# Patient Record
Sex: Female | Born: 1955
Health system: Southern US, Community
[De-identification: ages and names within clinical notes are randomized; demographics above are authoritative.]

## PROBLEM LIST (undated history)

## (undated) DIAGNOSIS — F419 Anxiety disorder, unspecified: Secondary | ICD-10-CM

## (undated) DIAGNOSIS — I219 Acute myocardial infarction, unspecified: Secondary | ICD-10-CM

## (undated) DIAGNOSIS — I2510 Atherosclerotic heart disease of native coronary artery without angina pectoris: Secondary | ICD-10-CM

## (undated) DIAGNOSIS — K219 Gastro-esophageal reflux disease without esophagitis: Secondary | ICD-10-CM

## (undated) DIAGNOSIS — Z955 Presence of coronary angioplasty implant and graft: Secondary | ICD-10-CM

## (undated) DIAGNOSIS — F32A Depression, unspecified: Secondary | ICD-10-CM

## (undated) DIAGNOSIS — E063 Autoimmune thyroiditis: Secondary | ICD-10-CM

## (undated) DIAGNOSIS — I10 Essential (primary) hypertension: Secondary | ICD-10-CM

## (undated) DIAGNOSIS — M1710 Unilateral primary osteoarthritis, unspecified knee: Secondary | ICD-10-CM

## (undated) DIAGNOSIS — G4734 Idiopathic sleep related nonobstructive alveolar hypoventilation: Secondary | ICD-10-CM

## (undated) DIAGNOSIS — J449 Chronic obstructive pulmonary disease, unspecified: Secondary | ICD-10-CM

## (undated) DIAGNOSIS — F329 Major depressive disorder, single episode, unspecified: Secondary | ICD-10-CM

## (undated) DIAGNOSIS — M179 Osteoarthritis of knee, unspecified: Secondary | ICD-10-CM

## (undated) DIAGNOSIS — Z9981 Dependence on supplemental oxygen: Secondary | ICD-10-CM

## (undated) DIAGNOSIS — T7840XA Allergy, unspecified, initial encounter: Secondary | ICD-10-CM

## (undated) DIAGNOSIS — R51 Headache: Secondary | ICD-10-CM

## (undated) DIAGNOSIS — E039 Hypothyroidism, unspecified: Secondary | ICD-10-CM

## (undated) DIAGNOSIS — M5030 Other cervical disc degeneration, unspecified cervical region: Secondary | ICD-10-CM

## (undated) DIAGNOSIS — E785 Hyperlipidemia, unspecified: Secondary | ICD-10-CM

## (undated) HISTORY — DX: Gastro-esophageal reflux disease without esophagitis: K21.9

## (undated) HISTORY — DX: Chronic obstructive pulmonary disease, unspecified: J44.9

## (undated) HISTORY — DX: Headache: R51

## (undated) HISTORY — DX: Autoimmune thyroiditis: E06.3

## (undated) HISTORY — DX: Acute myocardial infarction, unspecified: I21.9

## (undated) HISTORY — DX: Allergy, unspecified, initial encounter: T78.40XA

## (undated) HISTORY — PX: BUNIONECTOMY: SHX129

## (undated) HISTORY — DX: Anxiety disorder, unspecified: F41.9

## (undated) HISTORY — DX: Essential (primary) hypertension: I10

## (undated) HISTORY — DX: Idiopathic sleep related nonobstructive alveolar hypoventilation: G47.34

## (undated) HISTORY — DX: Other cervical disc degeneration, unspecified cervical region: M50.30

## (undated) HISTORY — DX: Hyperlipidemia, unspecified: E78.5

## (undated) HISTORY — PX: SHOULDER SURGERY: SHX246

## (undated) HISTORY — DX: Presence of coronary angioplasty implant and graft: Z95.5

## (undated) HISTORY — DX: Hypothyroidism, unspecified: E03.9

## (undated) HISTORY — DX: Depression, unspecified: F32.A

## (undated) HISTORY — PX: ABDOMINAL HYSTERECTOMY: SHX81

## (undated) HISTORY — PX: OOPHORECTOMY: SHX86

## (undated) HISTORY — DX: Major depressive disorder, single episode, unspecified: F32.9

## (undated) HISTORY — DX: Unilateral primary osteoarthritis, unspecified knee: M17.10

## (undated) HISTORY — DX: Osteoarthritis of knee, unspecified: M17.9

## (undated) HISTORY — PX: CERVICAL FUSION: SHX112

## (undated) HISTORY — PX: HAND SURGERY: SHX662

---
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
## 1982-03-07 HISTORY — PX: TUBAL LIGATION: SHX77

## 1994-03-07 HISTORY — PX: THYROIDECTOMY: SHX17

## 1999-02-16 ENCOUNTER — Ambulatory Visit: Admission: RE | Admit: 1999-02-16 | Discharge: 1999-02-16 | Payer: Self-pay | Admitting: Endocrinology

## 2000-03-31 ENCOUNTER — Encounter: Admission: RE | Admit: 2000-03-31 | Discharge: 2000-03-31 | Payer: Self-pay | Admitting: Internal Medicine

## 2000-03-31 ENCOUNTER — Encounter: Payer: Self-pay | Admitting: Internal Medicine

## 2000-11-09 ENCOUNTER — Encounter: Payer: Self-pay | Admitting: Internal Medicine

## 2000-11-09 ENCOUNTER — Encounter: Admission: RE | Admit: 2000-11-09 | Discharge: 2000-11-09 | Payer: Self-pay | Admitting: Internal Medicine

## 2001-05-07 ENCOUNTER — Other Ambulatory Visit: Admission: RE | Admit: 2001-05-07 | Discharge: 2001-05-07 | Payer: Self-pay | Admitting: Obstetrics and Gynecology

## 2001-05-11 ENCOUNTER — Encounter: Payer: Self-pay | Admitting: Obstetrics and Gynecology

## 2001-05-11 ENCOUNTER — Ambulatory Visit (HOSPITAL_COMMUNITY): Admission: RE | Admit: 2001-05-11 | Discharge: 2001-05-11 | Payer: Self-pay | Admitting: Obstetrics and Gynecology

## 2002-05-06 ENCOUNTER — Encounter: Payer: Self-pay | Admitting: Internal Medicine

## 2002-05-06 LAB — CONVERTED CEMR LAB

## 2003-10-13 ENCOUNTER — Other Ambulatory Visit: Admission: RE | Admit: 2003-10-13 | Discharge: 2003-10-13 | Payer: Self-pay | Admitting: Obstetrics and Gynecology

## 2004-01-12 ENCOUNTER — Ambulatory Visit: Payer: Self-pay | Admitting: Internal Medicine

## 2004-04-22 ENCOUNTER — Ambulatory Visit: Payer: Self-pay | Admitting: Internal Medicine

## 2004-12-27 ENCOUNTER — Ambulatory Visit: Payer: Self-pay | Admitting: Internal Medicine

## 2005-01-03 ENCOUNTER — Ambulatory Visit: Payer: Self-pay | Admitting: Internal Medicine

## 2005-01-11 ENCOUNTER — Ambulatory Visit: Payer: Self-pay | Admitting: Internal Medicine

## 2005-01-24 ENCOUNTER — Encounter: Payer: Self-pay | Admitting: Internal Medicine

## 2005-03-02 ENCOUNTER — Other Ambulatory Visit: Admission: RE | Admit: 2005-03-02 | Discharge: 2005-03-02 | Payer: Self-pay | Admitting: Obstetrics and Gynecology

## 2005-04-27 ENCOUNTER — Ambulatory Visit: Payer: Self-pay | Admitting: Internal Medicine

## 2005-05-06 ENCOUNTER — Ambulatory Visit: Payer: Self-pay

## 2005-06-17 ENCOUNTER — Ambulatory Visit: Payer: Self-pay | Admitting: Internal Medicine

## 2005-09-23 ENCOUNTER — Ambulatory Visit: Payer: Self-pay | Admitting: Internal Medicine

## 2005-12-29 ENCOUNTER — Ambulatory Visit: Payer: Self-pay | Admitting: Internal Medicine

## 2006-03-01 ENCOUNTER — Ambulatory Visit: Payer: Self-pay | Admitting: Internal Medicine

## 2006-03-22 ENCOUNTER — Ambulatory Visit (HOSPITAL_BASED_OUTPATIENT_CLINIC_OR_DEPARTMENT_OTHER): Admission: RE | Admit: 2006-03-22 | Discharge: 2006-03-22 | Payer: Self-pay | Admitting: Orthopedic Surgery

## 2006-10-05 ENCOUNTER — Ambulatory Visit: Payer: Self-pay | Admitting: Internal Medicine

## 2006-10-05 LAB — CONVERTED CEMR LAB
AST: 18 units/L (ref 0–37)
Bilirubin, Direct: 0.1 mg/dL (ref 0.0–0.3)
Chloride: 104 meq/L (ref 96–112)
Direct LDL: 157.3 mg/dL
Eosinophils Absolute: 0.1 10*3/uL (ref 0.0–0.6)
Eosinophils Relative: 1.5 % (ref 0.0–5.0)
GFR calc Af Amer: 115 mL/min
GFR calc non Af Amer: 95 mL/min
Glucose, Bld: 84 mg/dL (ref 70–99)
HCT: 40.2 % (ref 36.0–46.0)
Hemoglobin: 14.3 g/dL (ref 12.0–15.0)
Lymphocytes Relative: 42.2 % (ref 12.0–46.0)
MCV: 85.9 fL (ref 78.0–100.0)
Neutro Abs: 3.4 10*3/uL (ref 1.4–7.7)
Neutrophils Relative %: 48.6 % (ref 43.0–77.0)
Nitrite: NEGATIVE
Sodium: 138 meq/L (ref 135–145)
Urobilinogen, UA: 0.2 (ref 0.0–1.0)
WBC: 7.1 10*3/uL (ref 4.5–10.5)

## 2006-10-06 ENCOUNTER — Encounter: Payer: Self-pay | Admitting: Internal Medicine

## 2006-10-06 DIAGNOSIS — F411 Generalized anxiety disorder: Secondary | ICD-10-CM | POA: Insufficient documentation

## 2006-10-06 DIAGNOSIS — K219 Gastro-esophageal reflux disease without esophagitis: Secondary | ICD-10-CM | POA: Insufficient documentation

## 2006-10-06 DIAGNOSIS — F32A Depression, unspecified: Secondary | ICD-10-CM | POA: Insufficient documentation

## 2006-10-06 DIAGNOSIS — E039 Hypothyroidism, unspecified: Secondary | ICD-10-CM | POA: Insufficient documentation

## 2006-10-06 DIAGNOSIS — F329 Major depressive disorder, single episode, unspecified: Secondary | ICD-10-CM

## 2006-10-19 ENCOUNTER — Ambulatory Visit: Payer: Self-pay

## 2006-10-19 ENCOUNTER — Encounter: Payer: Self-pay | Admitting: Internal Medicine

## 2006-11-27 ENCOUNTER — Ambulatory Visit: Payer: Self-pay | Admitting: Internal Medicine

## 2007-05-17 ENCOUNTER — Encounter: Payer: Self-pay | Admitting: Internal Medicine

## 2007-05-18 ENCOUNTER — Encounter: Payer: Self-pay | Admitting: Internal Medicine

## 2007-06-14 ENCOUNTER — Ambulatory Visit: Payer: Self-pay | Admitting: Internal Medicine

## 2007-06-14 DIAGNOSIS — J3089 Other allergic rhinitis: Secondary | ICD-10-CM

## 2007-06-14 DIAGNOSIS — J302 Other seasonal allergic rhinitis: Secondary | ICD-10-CM | POA: Insufficient documentation

## 2007-06-14 DIAGNOSIS — E785 Hyperlipidemia, unspecified: Secondary | ICD-10-CM | POA: Insufficient documentation

## 2007-06-14 DIAGNOSIS — E7800 Pure hypercholesterolemia, unspecified: Secondary | ICD-10-CM | POA: Insufficient documentation

## 2007-06-14 DIAGNOSIS — J449 Chronic obstructive pulmonary disease, unspecified: Secondary | ICD-10-CM | POA: Insufficient documentation

## 2007-06-18 ENCOUNTER — Telehealth (INDEPENDENT_AMBULATORY_CARE_PROVIDER_SITE_OTHER): Payer: Self-pay | Admitting: *Deleted

## 2007-06-25 ENCOUNTER — Encounter: Payer: Self-pay | Admitting: Internal Medicine

## 2007-08-29 ENCOUNTER — Ambulatory Visit: Payer: Self-pay | Admitting: Internal Medicine

## 2007-08-29 LAB — CONVERTED CEMR LAB
AST: 19 units/L (ref 0–37)
Albumin: 4.1 g/dL (ref 3.5–5.2)
Alkaline Phosphatase: 80 units/L (ref 39–117)
BUN: 13 mg/dL (ref 6–23)
Bilirubin, Direct: 0.1 mg/dL (ref 0.0–0.3)
Chloride: 103 meq/L (ref 96–112)
Eosinophils Relative: 2.1 % (ref 0.0–5.0)
Glucose, Bld: 95 mg/dL (ref 70–99)
HDL: 34.6 mg/dL — ABNORMAL LOW (ref >39.0)
Hemoglobin, Urine: NEGATIVE
Leukocytes, UA: NEGATIVE
Lymphocytes Relative: 45.7 % (ref 12.0–46.0)
Monocytes Relative: 7.4 % (ref 3.0–12.0)
Neutrophils Relative %: 44.2 % (ref 43.0–77.0)
Nitrite: NEGATIVE
Platelets: 303 10*3/uL (ref 150–400)
Potassium: 4.4 meq/L (ref 3.5–5.1)
Total Protein, Urine: NEGATIVE mg/dL
Total Protein: 7.3 g/dL (ref 6.0–8.3)
Urobilinogen, UA: 0.2 (ref 0.0–1.0)
VLDL: 60 mg/dL — ABNORMAL HIGH (ref 0–40)
WBC: 5.3 10*3/uL (ref 4.5–10.5)

## 2007-09-05 ENCOUNTER — Ambulatory Visit: Payer: Self-pay | Admitting: Internal Medicine

## 2007-09-05 DIAGNOSIS — M25569 Pain in unspecified knee: Secondary | ICD-10-CM | POA: Insufficient documentation

## 2007-09-20 ENCOUNTER — Telehealth: Payer: Self-pay | Admitting: Internal Medicine

## 2007-09-20 ENCOUNTER — Encounter: Payer: Self-pay | Admitting: Internal Medicine

## 2007-09-24 ENCOUNTER — Ambulatory Visit (HOSPITAL_COMMUNITY): Admission: RE | Admit: 2007-09-24 | Discharge: 2007-09-25 | Payer: Self-pay | Admitting: Neurosurgery

## 2007-11-21 ENCOUNTER — Encounter: Admission: RE | Admit: 2007-11-21 | Discharge: 2007-11-21 | Payer: Self-pay | Admitting: Neurosurgery

## 2007-11-23 ENCOUNTER — Ambulatory Visit (HOSPITAL_BASED_OUTPATIENT_CLINIC_OR_DEPARTMENT_OTHER): Admission: RE | Admit: 2007-11-23 | Discharge: 2007-11-23 | Payer: Self-pay | Admitting: Obstetrics and Gynecology

## 2007-11-23 ENCOUNTER — Encounter (INDEPENDENT_AMBULATORY_CARE_PROVIDER_SITE_OTHER): Payer: Self-pay | Admitting: Obstetrics and Gynecology

## 2007-11-28 ENCOUNTER — Ambulatory Visit: Payer: Self-pay | Admitting: Internal Medicine

## 2007-11-28 ENCOUNTER — Telehealth (INDEPENDENT_AMBULATORY_CARE_PROVIDER_SITE_OTHER): Payer: Self-pay | Admitting: *Deleted

## 2007-11-28 DIAGNOSIS — J441 Chronic obstructive pulmonary disease with (acute) exacerbation: Secondary | ICD-10-CM | POA: Insufficient documentation

## 2007-12-24 ENCOUNTER — Telehealth (INDEPENDENT_AMBULATORY_CARE_PROVIDER_SITE_OTHER): Payer: Self-pay | Admitting: *Deleted

## 2007-12-28 ENCOUNTER — Telehealth (INDEPENDENT_AMBULATORY_CARE_PROVIDER_SITE_OTHER): Payer: Self-pay | Admitting: *Deleted

## 2008-01-06 HISTORY — PX: TOTAL ABDOMINAL HYSTERECTOMY W/ BILATERAL SALPINGOOPHORECTOMY: SHX83

## 2008-01-08 ENCOUNTER — Encounter (INDEPENDENT_AMBULATORY_CARE_PROVIDER_SITE_OTHER): Payer: Self-pay | Admitting: Obstetrics and Gynecology

## 2008-01-08 ENCOUNTER — Ambulatory Visit (HOSPITAL_COMMUNITY): Admission: RE | Admit: 2008-01-08 | Discharge: 2008-01-10 | Payer: Self-pay | Admitting: Obstetrics and Gynecology

## 2008-02-11 ENCOUNTER — Telehealth (INDEPENDENT_AMBULATORY_CARE_PROVIDER_SITE_OTHER): Payer: Self-pay | Admitting: *Deleted

## 2008-02-13 ENCOUNTER — Telehealth (INDEPENDENT_AMBULATORY_CARE_PROVIDER_SITE_OTHER): Payer: Self-pay | Admitting: *Deleted

## 2008-03-03 ENCOUNTER — Telehealth: Payer: Self-pay | Admitting: Internal Medicine

## 2008-03-13 ENCOUNTER — Ambulatory Visit: Payer: Self-pay | Admitting: Internal Medicine

## 2008-05-13 ENCOUNTER — Encounter: Admission: RE | Admit: 2008-05-13 | Discharge: 2008-05-13 | Payer: Self-pay | Admitting: Obstetrics and Gynecology

## 2008-07-15 ENCOUNTER — Ambulatory Visit: Payer: Self-pay | Admitting: Internal Medicine

## 2008-07-15 DIAGNOSIS — M542 Cervicalgia: Secondary | ICD-10-CM | POA: Insufficient documentation

## 2008-07-15 DIAGNOSIS — R609 Edema, unspecified: Secondary | ICD-10-CM | POA: Insufficient documentation

## 2008-07-15 DIAGNOSIS — R5383 Other fatigue: Secondary | ICD-10-CM | POA: Insufficient documentation

## 2008-07-15 DIAGNOSIS — R413 Other amnesia: Secondary | ICD-10-CM | POA: Insufficient documentation

## 2008-07-15 LAB — CONVERTED CEMR LAB
ALT: 18 units/L (ref 0–35)
Alkaline Phosphatase: 75 units/L (ref 39–117)
BUN: 11 mg/dL (ref 6–23)
Basophils Relative: 0.9 % (ref 0.0–3.0)
Bilirubin, Direct: 0.1 mg/dL (ref 0.0–0.3)
CO2: 31 meq/L (ref 19–32)
Calcium: 9.7 mg/dL (ref 8.4–10.5)
Direct LDL: 147 mg/dL
Eosinophils Relative: 2.9 % (ref 0.0–5.0)
Folate: 8 ng/mL
GFR calc non Af Amer: 93.87 mL/min (ref >60)
Glucose, Bld: 95 mg/dL (ref 70–99)
HCT: 42 % (ref 36.0–46.0)
Lymphocytes Relative: 37 % (ref 12.0–46.0)
MCHC: 35.4 g/dL (ref 30.0–36.0)
Neutrophils Relative %: 53.8 % (ref 43.0–77.0)
Platelets: 260 10*3/uL (ref 150.0–400.0)
Potassium: 4.6 meq/L (ref 3.5–5.1)
RDW: 12 % (ref 11.5–14.6)
Sed Rate: 21 mm/hr (ref 0–22)
Total CHOL/HDL Ratio: 7
Total Protein: 7.5 g/dL (ref 6.0–8.3)
Vitamin B-12: 680 pg/mL (ref 211–911)

## 2008-07-18 ENCOUNTER — Encounter: Admission: RE | Admit: 2008-07-18 | Discharge: 2008-07-18 | Payer: Self-pay | Admitting: Internal Medicine

## 2008-09-18 ENCOUNTER — Telehealth: Payer: Self-pay | Admitting: Internal Medicine

## 2008-09-30 ENCOUNTER — Telehealth (INDEPENDENT_AMBULATORY_CARE_PROVIDER_SITE_OTHER): Payer: Self-pay | Admitting: *Deleted

## 2008-10-01 ENCOUNTER — Encounter: Payer: Self-pay | Admitting: Internal Medicine

## 2008-10-20 ENCOUNTER — Telehealth: Payer: Self-pay | Admitting: Internal Medicine

## 2008-11-27 ENCOUNTER — Ambulatory Visit: Payer: Self-pay | Admitting: Internal Medicine

## 2008-11-27 ENCOUNTER — Telehealth: Payer: Self-pay | Admitting: Internal Medicine

## 2008-11-27 DIAGNOSIS — M549 Dorsalgia, unspecified: Secondary | ICD-10-CM | POA: Insufficient documentation

## 2008-11-27 DIAGNOSIS — J45901 Unspecified asthma with (acute) exacerbation: Secondary | ICD-10-CM | POA: Insufficient documentation

## 2009-02-13 ENCOUNTER — Ambulatory Visit: Payer: Self-pay | Admitting: Internal Medicine

## 2009-02-13 LAB — CONVERTED CEMR LAB
AST: 19 units/L (ref 0–37)
Albumin: 4.2 g/dL (ref 3.5–5.2)
Basophils Absolute: 0.1 10*3/uL (ref 0.0–0.1)
Bilirubin Urine: NEGATIVE
CO2: 29 meq/L (ref 19–32)
GFR calc non Af Amer: 93.65 mL/min (ref >60)
Glucose, Bld: 94 mg/dL (ref 70–99)
HCT: 42.5 % (ref 36.0–46.0)
Hemoglobin, Urine: NEGATIVE
Hemoglobin: 14.6 g/dL (ref 12.0–15.0)
Ketones, ur: NEGATIVE mg/dL
Leukocytes, UA: NEGATIVE
Lymphs Abs: 2.1 10*3/uL (ref 0.7–4.0)
MCHC: 34.3 g/dL (ref 30.0–36.0)
Monocytes Relative: 7.2 % (ref 3.0–12.0)
Neutro Abs: 3.3 10*3/uL (ref 1.4–7.7)
Potassium: 3.8 meq/L (ref 3.5–5.1)
RDW: 12.4 % (ref 11.5–14.6)
Sodium: 140 meq/L (ref 135–145)
Specific Gravity, Urine: 1.02 (ref 1.000–1.030)
TSH: 7.25 microintl units/mL — ABNORMAL HIGH (ref 0.35–5.50)
Total Protein: 7.4 g/dL (ref 6.0–8.3)
Urobilinogen, UA: 0.2 (ref 0.0–1.0)

## 2009-02-20 ENCOUNTER — Ambulatory Visit: Payer: Self-pay | Admitting: Internal Medicine

## 2009-02-20 DIAGNOSIS — IMO0002 Reserved for concepts with insufficient information to code with codable children: Secondary | ICD-10-CM | POA: Insufficient documentation

## 2009-02-20 DIAGNOSIS — R062 Wheezing: Secondary | ICD-10-CM | POA: Insufficient documentation

## 2009-02-20 DIAGNOSIS — M1710 Unilateral primary osteoarthritis, unspecified knee: Secondary | ICD-10-CM

## 2009-02-20 DIAGNOSIS — M159 Polyosteoarthritis, unspecified: Secondary | ICD-10-CM | POA: Insufficient documentation

## 2009-02-20 DIAGNOSIS — G43009 Migraine without aura, not intractable, without status migrainosus: Secondary | ICD-10-CM | POA: Insufficient documentation

## 2009-02-20 DIAGNOSIS — G894 Chronic pain syndrome: Secondary | ICD-10-CM | POA: Insufficient documentation

## 2009-03-25 ENCOUNTER — Ambulatory Visit: Payer: Self-pay | Admitting: Internal Medicine

## 2009-03-25 LAB — CONVERTED CEMR LAB
AST: 24 units/L (ref 0–37)
Alkaline Phosphatase: 70 units/L (ref 39–117)
Bilirubin, Direct: 0.1 mg/dL (ref 0.0–0.3)
Cholesterol: 209 mg/dL — ABNORMAL HIGH (ref 0–200)
Total Bilirubin: 0.5 mg/dL (ref 0.3–1.2)
Total CHOL/HDL Ratio: 6

## 2009-04-07 ENCOUNTER — Telehealth: Payer: Self-pay | Admitting: Internal Medicine

## 2009-05-14 ENCOUNTER — Encounter: Admission: RE | Admit: 2009-05-14 | Discharge: 2009-05-14 | Payer: Self-pay | Admitting: Obstetrics and Gynecology

## 2009-10-07 ENCOUNTER — Ambulatory Visit: Payer: Self-pay | Admitting: Internal Medicine

## 2009-10-07 DIAGNOSIS — E559 Vitamin D deficiency, unspecified: Secondary | ICD-10-CM | POA: Insufficient documentation

## 2009-10-08 ENCOUNTER — Telehealth: Payer: Self-pay | Admitting: Internal Medicine

## 2009-10-13 ENCOUNTER — Telehealth (INDEPENDENT_AMBULATORY_CARE_PROVIDER_SITE_OTHER): Payer: Self-pay | Admitting: *Deleted

## 2009-10-14 ENCOUNTER — Encounter: Payer: Self-pay | Admitting: Internal Medicine

## 2009-10-28 ENCOUNTER — Telehealth (INDEPENDENT_AMBULATORY_CARE_PROVIDER_SITE_OTHER): Payer: Self-pay | Admitting: *Deleted

## 2009-12-28 ENCOUNTER — Telehealth: Payer: Self-pay | Admitting: Internal Medicine

## 2010-01-06 ENCOUNTER — Encounter: Payer: Self-pay | Admitting: Internal Medicine

## 2010-01-22 ENCOUNTER — Encounter: Payer: Self-pay | Admitting: Internal Medicine

## 2010-02-04 LAB — CONVERTED CEMR LAB: Pap Smear: NORMAL

## 2010-02-09 ENCOUNTER — Ambulatory Visit: Payer: Self-pay | Admitting: Internal Medicine

## 2010-02-10 ENCOUNTER — Encounter: Payer: Self-pay | Admitting: Internal Medicine

## 2010-02-10 LAB — CONVERTED CEMR LAB
ALT: 14 units/L (ref 0–35)
Albumin: 4.3 g/dL (ref 3.5–5.2)
BUN: 15 mg/dL (ref 6–23)
Basophils Absolute: 0 10*3/uL (ref 0.0–0.1)
Chloride: 104 meq/L (ref 96–112)
Cholesterol: 228 mg/dL — ABNORMAL HIGH (ref 0–200)
Direct LDL: 137 mg/dL
Eosinophils Relative: 1.5 % (ref 0.0–5.0)
Folate: 5.2 ng/mL
Glucose, Bld: 93 mg/dL (ref 70–99)
HCT: 41.1 % (ref 36.0–46.0)
Hemoglobin: 14.2 g/dL (ref 12.0–15.0)
Ketones, ur: NEGATIVE mg/dL
Lymphs Abs: 2.1 10*3/uL (ref 0.7–4.0)
MCV: 85.4 fL (ref 78.0–100.0)
Monocytes Absolute: 0.4 10*3/uL (ref 0.1–1.0)
Neutro Abs: 2.8 10*3/uL (ref 1.4–7.7)
Platelets: 268 10*3/uL (ref 150.0–400.0)
Potassium: 4.7 meq/L (ref 3.5–5.1)
RDW: 13.2 % (ref 11.5–14.6)
Sodium: 140 meq/L (ref 135–145)
Specific Gravity, Urine: 1.02 (ref 1.000–1.030)
TSH: 2.24 microintl units/mL (ref 0.35–5.50)
Total Bilirubin: 0.7 mg/dL (ref 0.3–1.2)
Total Protein, Urine: NEGATIVE mg/dL
Urine Glucose: NEGATIVE mg/dL
Urobilinogen, UA: 0.2 (ref 0.0–1.0)
VLDL: 36.8 mg/dL (ref 0.0–40.0)
Vitamin B-12: 314 pg/mL (ref 211–911)

## 2010-02-11 LAB — CONVERTED CEMR LAB: Vit D, 25-Hydroxy: 41 ng/mL (ref 30–89)

## 2010-02-16 ENCOUNTER — Ambulatory Visit: Payer: Self-pay | Admitting: Internal Medicine

## 2010-02-16 ENCOUNTER — Encounter: Payer: Self-pay | Admitting: Internal Medicine

## 2010-02-16 DIAGNOSIS — N959 Unspecified menopausal and perimenopausal disorder: Secondary | ICD-10-CM | POA: Insufficient documentation

## 2010-02-16 LAB — HM COLONOSCOPY: HM Colonoscopy: ABNORMAL

## 2010-02-18 ENCOUNTER — Encounter: Payer: Self-pay | Admitting: Internal Medicine

## 2010-02-18 ENCOUNTER — Ambulatory Visit: Payer: Self-pay | Admitting: Internal Medicine

## 2010-03-28 ENCOUNTER — Encounter: Payer: Self-pay | Admitting: Neurosurgery

## 2010-04-08 NOTE — Medication Information (Signed)
Summary: Omeprazole Approved/[employer]  Omeprazole Approved/[employer]   Imported By: Sherian Rein 10/19/2009 12:06:07  _____________________________________________________________________  External Attachment:    Type:   Image     Comment:   External Document

## 2010-04-08 NOTE — Assessment & Plan Note (Signed)
Summary: low energy/earache/cd   Vital Signs:  Patient profile:   55 year old female Height:      67 inches Weight:      190.25 pounds BMI:     29.91 O2 Sat:      94 % on Room air Temp:     98 degrees F oral Pulse rate:   78 / minute BP sitting:   120 / 92  (left arm) Cuff size:   regular  Vitals Entered By: Zella Ball Ewing CMA Duncan Dull) (October 07, 2009 9:23 AM)  O2 Flow:  Room air CC: Low energy, left ear pain/RE   CC:  Low energy and left ear pain/RE.  History of Present Illness: here to f/u;  c/o lack of energy;  wt loss of 9 lbs since dec 2010; has some sob/doe  with deep cough nonprod and occasional wheezing;  using the proair that seems to help mult times per day;  now s/p right knee arthroscopy in march to which she did well, then left knee arthsocopy in june 2011 whcih didnot work out as well with some residual knee tenderness to touch and walk, as well as left groin pain worse with cross the legs, and stand and walk;  has known lumber disc disease  but no incresaed lumbar pain; no change in bowel or bladder,  or other worsening LE pain or weakness, but does have left leg heaviness, tingling down the leg and  numbness of the second and third toes after standing approx 5 min;  also with numbness or tips of great and second toes chroinic persistent for  months without change with position.  Cymbalta seemed to work well to start iwth the anxiety (started over a yr ago) but now much less effective;  even 2 lorazepam .5 mg makes her sleepy/groggy but still anxious, hard to sleep due to mind racing  - anxiety overall worse the last 2 mo.  Cont's to be out of work for approx 4 yrs - now out on [employer];  has been told they will prob settle the claim and likely to lose the job as she has a 2 lb wt lifting limit.  Denies worsening depressive symtpoms,  or suicidal ideation.  Did have to stop the pravachol months ago due to myalgias. Last TSH done dec 2010 - miniamlly elevated.   Problems  Prior to Update: 1)  Vitamin D Deficiency  (ICD-268.9) 2)  Osteoarthritis, Knee, Right  (ICD-715.96) 3)  Common Migraine  (ICD-346.10) 4)  Chronic Pain Syndrome  (ICD-338.4) 5)  Wheezing  (ICD-786.07) 6)  Preventive Health Care  (ICD-V70.0) 7)  Skin Lesion  (ICD-709.9) 8)  Back Pain  (ICD-724.5) 9)  Asthma Unspecified With Exacerbation  (ICD-493.92) 10)  Memory Loss  (ICD-780.93) 11)  Cervicalgia  (ICD-723.1) 12)  Fatigue  (ICD-780.79) 13)  Peripheral Edema  (ICD-782.3) 14)  Sinusitis- Acute-nos  (ICD-461.9) 15)  Chronic Obstructive Pulmonary Disease, Acute Exacerbation  (ICD-491.21) 16)  Knee Pain, Bilateral  (ICD-719.46) 17)  Preventive Health Care  (ICD-V70.0) 18)  Routine General Medical Exam@health  Care Facl  (ICD-V70.0) 19)  Hyperlipidemia  (ICD-272.4) 20)  Allergic Rhinitis  (ICD-477.9) 21)  COPD  (ICD-496) 22)  Headache  (ICD-784.0) 23)  Depression  (ICD-311) 24)  Anxiety  (ICD-300.00) 25)  Hypothyroidism  (ICD-244.9) 26)  Gerd  (ICD-530.81)  Medications Prior to Update: 1)  Cymbalta 60 Mg Cpep (Duloxetine Hcl) .... By Mouth Once Daily 2)  Ativan 0.5 Mg  Tabs (Lorazepam) .Marland Kitchen.. 1 Two  Times A Day As Needed - To Fill Apr 18, 2009 3)  Synthroid 175 Mcg Tabs (Levothyroxine Sodium) .Marland Kitchen.. 1po Once Daily (Daw) 4)  Tylenol Extra Strength 500 Mg  Tabs (Acetaminophen) .... 2 Tabs By Mouth Tid 5)  Omeprazole 20 Mg  Cpdr (Omeprazole) .Marland Kitchen.. 1po Once Daily 6)  Proair Hfa 108 (90 Base) Mcg/act Aers (Albuterol Sulfate) .... 2 Puffs Qid As Needed 7)  Vitamin D 1000 Unit Tabs (Cholecalciferol) .... 2 By Mouth Once Daily 8)  Hydrochlorothiazide 25 Mg Tabs (Hydrochlorothiazide) .... 1/2 By Mouth Once Daily 9)  Qvar 80 Mcg/act Aers (Beclomethasone Dipropionate) .... 2 Puffs Two Times A Day 10)  Fluticasone Propionate 50 Mcg/act Susp (Fluticasone Propionate) .... 2 Spray/side Once Daily 11)  Pravastatin Sodium 40 Mg Tabs (Pravastatin Sodium) .Marland Kitchen.. 1 By Mouth Once Daily 12)  Aspir-Low 81 Mg Tbec  (Aspirin) .Marland Kitchen.. 1po Once Daily 13)  Cephalexin 500 Mg Caps (Cephalexin) .Marland Kitchen.. 1 By Mouth Three Times A Day 14)  Prednisone 10 Mg Tabs (Prednisone) .... 3po Qd For 3days, Then 2po Qd For 3days, Then 1po Qd For 3days, Then Stop  Current Medications (verified): 1)  Cymbalta 60 Mg Cpep (Duloxetine Hcl) .... By Mouth Once Daily 2)  Ativan 0.5 Mg  Tabs (Lorazepam) .Marland Kitchen.. 1 Two Times A Day As Needed - To Fill Apr 18, 2009 3)  Synthroid 175 Mcg Tabs (Levothyroxine Sodium) .Marland Kitchen.. 1po Once Daily (Daw) 4)  Tylenol Extra Strength 500 Mg  Tabs (Acetaminophen) .... 2 Tabs By Mouth Tid 5)  Omeprazole 20 Mg  Cpdr (Omeprazole) .Marland Kitchen.. 1po Once Daily 6)  Proair Hfa 108 (90 Base) Mcg/act Aers (Albuterol Sulfate) .... 2 Puffs Qid As Needed 7)  Vitamin D 1000 Unit Tabs (Cholecalciferol) .... 2 By Mouth Once Daily 8)  Hydrochlorothiazide 25 Mg Tabs (Hydrochlorothiazide) .... 1/2 By Mouth Once Daily 9)  Qvar 80 Mcg/act Aers (Beclomethasone Dipropionate) .... 2 Puffs Two Times A Day 10)  Fluticasone Propionate 50 Mcg/act Susp (Fluticasone Propionate) .... 2 Spray/side Once Daily 11)  Aspir-Low 81 Mg Tbec (Aspirin) .Marland Kitchen.. 1po Once Daily  Allergies (verified): 1)  ! Tetracycline 2)  ! Levaquin 3)  Pravachol   Impression & Recommendations:  Problem # 1:  ASTHMA UNSPECIFIED WITH EXACERBATION (ICD-493.92)  The following medications were removed from the medication list:    Prednisone 10 Mg Tabs (Prednisone) .Marland Kitchen... 3po qd for 3days, then 2po qd for 3days, then 1po qd for 3days, then stop Her updated medication list for this problem includes:    Xopenex Hfa 45 Mcg/act Aero (Levalbuterol tartrate) .Marland Kitchen... 2 puffs four times per day as needed    Symbicort 160-4.5 Mcg/act Aero (Budesonide-formoterol fumarate) .Marland Kitchen... 2 puffs two times a day mild,  to stop the qvar, start the symbicort asd, and try xopenex insttead of the proair due to jitteriness  Problem # 2:  FATIGUE (ICD-780.79) multifactorial , most likely related to above and  anxiety - .exam o/w benign, most labs reviewed; follow with expectant management   Problem # 3:  ANXIETY (ICD-300.00)  Her updated medication list for this problem includes:    Cymbalta 60 Mg Cpep (Duloxetine hcl) ..... By mouth once daily    Clonazepam 0.5 Mg Tabs (Clonazepam) .Marland Kitchen... 1 by mouth qam, and 2 by mouth qpm as needed treat as above, f/u any worsening signs or symptoms , and add the abilify once daily 2.5 mg   Problem # 4:  HYPERLIPIDEMIA (ICD-272.4)  The following medications were removed from the medication list:  Pravastatin Sodium 40 Mg Tabs (Pravastatin sodium) .Marland Kitchen... 1 by mouth once daily  Labs Reviewed: SGOT: 24 (03/25/2009)   SGPT: 16 (03/25/2009)   HDL:36.00 (03/25/2009), 39.90 (02/13/2009)  LDL:DEL (08/29/2007), DEL (10/05/2006)  Chol:209 (03/25/2009), 244 (02/13/2009)  Trig:233.0 (03/25/2009), 224.0 (02/13/2009) d/w pt - due to above, she is reluctant to change statin such as lipitor, to top the pravachol as she has due to myalgia , work on lower chol diet as well, f/u next visit  Complete Medication List: 1)  Cymbalta 60 Mg Cpep (Duloxetine hcl) .... By mouth once daily 2)  Clonazepam 0.5 Mg Tabs (Clonazepam) .Marland Kitchen.. 1 by mouth qam, and 2 by mouth qpm as needed 3)  Synthroid 175 Mcg Tabs (Levothyroxine sodium) .Marland Kitchen.. 1po once daily (daw) 4)  Tylenol Extra Strength 500 Mg Tabs (Acetaminophen) .... 2 tabs by mouth tid 5)  Omeprazole 20 Mg Cpdr (Omeprazole) .Marland Kitchen.. 1po once daily 6)  Xopenex Hfa 45 Mcg/act Aero (Levalbuterol tartrate) .... 2 puffs four times per day as needed 7)  Vitamin D 1000 Unit Tabs (Cholecalciferol) .... 2 by mouth once daily 8)  Hydrochlorothiazide 25 Mg Tabs (Hydrochlorothiazide) .... 1/2 by mouth once daily 9)  Symbicort 160-4.5 Mcg/act Aero (Budesonide-formoterol fumarate) .... 2 puffs two times a day 10)  Fluticasone Propionate 50 Mcg/act Susp (Fluticasone propionate) .... 2 spray/side once daily 11)  Aspir-low 81 Mg Tbec (Aspirin) .Marland Kitchen.. 1po once  daily 12)  Abilify 5 Mg Tabs (Aripiprazole) .... 1/2 by mouth once daily  Patient Instructions: 1)  stop the qvar 2)  start the symbicort 160/4/5   - 2 puffs two times a day  3)  hopefully the proair use can be reduced with using the symbicort 4)  you are given the xopenex prescripiton to use as well (this is the same as proair without the jitteriness)   - if the insurance will not cover we may have to go back to the proair 5)  start the Abilfy at HALF of the 5 mg dose 6)  stop the ativan 7)  start the clonazepam at one in the am, and 2 in the PM to help with anxiety, as well as the insomnia and restless leg at night 8)  stop the pravachol as you have 9)  please take a B complex vitamin  - 1 by mouth once daily  10)  Please schedule a follow-up appointment in 4 months with CPX labs and:    11)  vit d level:  268.9 12)  B12/folate  285.9 Prescriptions: ABILIFY 5 MG TABS (ARIPIPRAZOLE) 1 by mouth once daily  #30 x 11   Entered and Authorized by:   Corwin Levins MD   Signed by:   Corwin Levins MD on 10/07/2009   Method used:   Print then Give to Patient   RxID:   [id number] SYMBICORT 160-4.5 MCG/ACT AERO (BUDESONIDE-FORMOTEROL FUMARATE) 2 puffs two times a day  #1 x 11   Entered and Authorized by:   Corwin Levins MD   Signed by:   Corwin Levins MD on 10/07/2009   Method used:   Print then Give to Patient   RxID:   [id number] UUVOZDG HFA 45 MCG/ACT AERO (LEVALBUTEROL TARTRATE) 2 puffs four times per day as needed  #1 x 11   Entered and Authorized by:   Corwin Levins MD   Signed by:   Corwin Levins MD on 10/07/2009   Method used:   Print then Give to Patient   RxID:  [id number] CLONAZEPAM 0.5 MG TABS (CLONAZEPAM) 1 by mouth qam, and 2 by mouth qpm as needed  #90 x 4   Entered and Authorized by:   Corwin Levins MD   Signed by:   Corwin Levins MD on 10/07/2009   Method used:   Print then Give to Patient   RxID:   [id number]

## 2010-04-08 NOTE — Progress Notes (Signed)
Summary: PA-Omeprazole  Phone Note From Pharmacy   Summary of Call: PA-Omeprazole, called [employer] for form @ [phone], awaiting form. Dagoberto Reef  October 13, 2009 11:43 AM  Faxed to [facility] @ [phone], awaiting approval .  Initial call taken by: Dagoberto Reef,  October 14, 2009 2:35 PM  Follow-up for Phone Call        PA-Omeprazole approved 09/23/09-10/14/2010, pt aware. Follow-up by: Dagoberto Reef,  October 15, 2009 3:45 PM

## 2010-04-08 NOTE — Progress Notes (Signed)
Summary: Pharmacy change  Phone Note Refill Request Message from:  Patient on October 08, 2009 2:08 PM  Refills Requested: Medication #1:  TRAMADOL HCL 50 MG TABS 1po q 6 hrs as needed pain.   Dosage confirmed as above?Dosage Confirmed   Supply Requested: 1 month Rx was sent to wrong pharmacy, should have been [facility] in [place]   Method Requested: Electronic Initial call taken by: Margaret Pyle, CMA,  October 08, 2009 2:09 PM    Prescriptions: TRAMADOL HCL 50 MG TABS (TRAMADOL HCL) 1po q 6 hrs as needed pain  #60 x 1   Entered by:   Margaret Pyle, CMA   Authorized by:   Corwin Levins MD   Signed by:   Margaret Pyle, CMA on 10/08/2009   Method used:   Electronically to        [facility]  [address] [phone]* (retail)       [address]       [place], [place]  [address]       Ph: [phone] or [phone]       Fax: [phone]   RxID:   [id number]

## 2010-04-08 NOTE — Assessment & Plan Note (Signed)
Summary: CPX/BCBS/#/CD   Vital Signs:  Patient profile:   55 year old female Height:      67 inches Weight:      200.13 pounds BMI:     31.46 O2 Sat:      97 % on Room air Temp:     98.4 degrees F oral Pulse rate:   74 / minute BP sitting:   112 / 84  (left arm) Cuff size:   large  Vitals Entered By: Zella Ball Ewing CMA Duncan Dull) (February 16, 2010 10:00 AM)  O2 Flow:  Room air  Preventive Care Screening  Colonoscopy:    Date:  01/22/2010    Next Due:  02/2013    Results:  abnormal   Last Flu Shot:    Date:  02/16/2010    Results:  Fluvax 3+  Pap Smear:    Date:  02/04/2010    Results:  normal   Mammogram:    Date:  05/05/2009    Results:  normal   CC: Adult Physical/RE   CC:  Adult Physical/RE.  History of Present Illness: here for wellness;  overall doing well;  Pt denies CP, worsening sob, doe, wheezing, orthopnea, pnd, worsening LE edema, palps, dizziness or syncope  Pt denies new neuro symptoms such as headache, facial or extremity weakness  Pt denies polydipsia, polyuria,  Overall good compliance with meds, trying to follow low chol diet, wt stable, little excercise however  Denies worsening depressive symptoms, suicidal ideation, or panic, though has some ongoing anxiety.  No fever, wt loss, night sweats, loss of appetite or other constitutional symptoms  Overall good compliance with meds, and good tolerability.  Pt states good ability with ADL's, low fall risk, home safety reviewed and adequate, no significant change in hearing or vision, trying to follow lower chol diet, and occasionally active only with regular excercise.  No other new complaints.  Needs all med refills.  Unfortunatleyl losing her insurance next month, for flu shot today, requests samples today  Preventive Screening-Counseling & Management      Drug Use:  no.    Problems Prior to Update: 1)  Menopausal Disorder  (ICD-627.9) 2)  Vitamin D Deficiency  (ICD-268.9) 3)  Osteoarthritis, Knee, Right   (ICD-715.96) 4)  Common Migraine  (ICD-346.10) 5)  Chronic Pain Syndrome  (ICD-338.4) 6)  Wheezing  (ICD-786.07) 7)  Preventive Health Care  (ICD-V70.0) 8)  Skin Lesion  (ICD-709.9) 9)  Back Pain  (ICD-724.5) 10)  Asthma Unspecified With Exacerbation  (ICD-493.92) 11)  Memory Loss  (ICD-780.93) 12)  Cervicalgia  (ICD-723.1) 13)  Fatigue  (ICD-780.79) 14)  Peripheral Edema  (ICD-782.3) 15)  Sinusitis- Acute-nos  (ICD-461.9) 16)  Chronic Obstructive Pulmonary Disease, Acute Exacerbation  (ICD-491.21) 17)  Knee Pain, Bilateral  (ICD-719.46) 18)  Preventive Health Care  (ICD-V70.0) 19)  Routine General Medical Exam@health  Care Facl  (ICD-V70.0) 20)  Hyperlipidemia  (ICD-272.4) 21)  Allergic Rhinitis  (ICD-477.9) 22)  COPD  (ICD-496) 23)  Headache  (ICD-784.0) 24)  Depression  (ICD-311) 25)  Anxiety  (ICD-300.00) 26)  Hypothyroidism  (ICD-244.9) 27)  Gerd  (ICD-530.81)  Medications Prior to Update: 1)  Cymbalta 60 Mg Cpep (Duloxetine Hcl) .... By Mouth Once Daily 2)  Clonazepam 0.5 Mg Tabs (Clonazepam) .Marland Kitchen.. 1 By Mouth Qam, and 2 By Mouth Qpm As Needed 3)  Synthroid 175 Mcg Tabs (Levothyroxine Sodium) .Marland Kitchen.. 1po Once Daily (Daw) 4)  Tylenol Extra Strength 500 Mg  Tabs (Acetaminophen) .... 2 Tabs By Mouth Tid 5)  Omeprazole 20 Mg  Cpdr (Omeprazole) .Marland Kitchen.. 1po Once Daily 6)  Xopenex Hfa 45 Mcg/act Aero (Levalbuterol Tartrate) .... 2 Puffs Four Times Per Day As Needed 7)  Vitamin D 1000 Unit Tabs (Cholecalciferol) .... 2 By Mouth Once Daily 8)  Hydrochlorothiazide 25 Mg Tabs (Hydrochlorothiazide) .... 1/2 By Mouth Once Daily 9)  Symbicort 160-4.5 Mcg/act Aero (Budesonide-Formoterol Fumarate) .... 2 Puffs Two Times A Day 10)  Fluticasone Propionate 50 Mcg/act Susp (Fluticasone Propionate) .... 2 Spray/side Once Daily 11)  Aspir-Low 81 Mg Tbec (Aspirin) .Marland Kitchen.. 1po Once Daily 12)  Abilify 5 Mg Tabs (Aripiprazole) .... 1/2 By Mouth Once Daily 13)  Tramadol Hcl 50 Mg Tabs (Tramadol Hcl) .Marland Kitchen.. 1po Q  6 Hrs As Needed Pain  Current Medications (verified): 1)  Cymbalta 60 Mg Cpep (Duloxetine Hcl) .Marland Kitchen.. 1 By Mouth Once Daily 2)  Clonazepam 0.5 Mg Tabs (Clonazepam) .Marland Kitchen.. 1 By Mouth Qam, and 2 By Mouth Qpm As Needed 3)  Synthroid 175 Mcg Tabs (Levothyroxine Sodium) .Marland Kitchen.. 1po Once Daily (Daw) 4)  Tylenol Extra Strength 500 Mg  Tabs (Acetaminophen) .... 2 Tabs By Mouth Tid 5)  Omeprazole 20 Mg  Cpdr (Omeprazole) .Marland Kitchen.. 1po Once Daily 6)  Hydrochlorothiazide 25 Mg Tabs (Hydrochlorothiazide) .... 1/2 By Mouth Once Daily 7)  Fluticasone Propionate 50 Mcg/act Susp (Fluticasone Propionate) .... 2 Spray/side Once Daily 8)  Tramadol Hcl 50 Mg Tabs (Tramadol Hcl) .Marland Kitchen.. 1po Q 6 Hrs As Needed Pain 9)  Tylenol Arthritis Pain 650 Mg Cr-Tabs (Acetaminophen) .... By Mouth Every 8 Hours As Needed 10)  Proventil Hfa 108 (90 Base) Mcg/act Aers (Albuterol Sulfate) .... 2 Puffs Four Times Per Day As Needed 11)  Nasonex 50 Mcg/act Susp (Mometasone Furoate) .... 2 Spray/side Once Daily  Allergies (verified): 1)  ! Tetracycline 2)  ! Levaquin 3)  Pravachol  Past History:  Past Medical History: Last updated: 02/20/2009 GERD Hypothyroidism Anxiety Depression Headache c-spine disc disease/spondylosis COPD Allergic rhinitis Hyperlipidemia right knee DJD  Past Surgical History: Last updated: 07/15/2008 Tubal ligation- 1984 Appendectomy-1971 Thyroidectomy-1996 Tonsillectomy-1965 s/p right shoulder surgury s/p TAH nov 2009 with cervical dysplasia, also BSO at that time Oophorectomy Hysterectomy  Family History: Last updated: 06/14/2007 father with MI at 44 yo mother with lung cancer at 66 yo, HTN, DM, heart disease  Social History: Last updated: 02/16/2010 Married 1 daughter Former Smoker 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 - applying for disability; losing [employer] insurance jan1 2012 Drug use-no  Risk Factors: Smoking Status: quit  (06/14/2007)  Social History: Married 1 daughter Former Smoker 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         all otherwise negative per pt -    Physical Exam  General:  alert and overweight-appearing. Head:  normocephalic and atraumatic.   Eyes:  vision grossly intact, pupils equal, and pupils round.   Ears:  R ear normal and L ear normal.   Nose:  no external deformity and no nasal discharge.   Mouth:  no gingival abnormalities and pharynx pink and moist.  Neck:  supple and no masses.   Lungs:  normal respiratory effort and normal breath sounds.   Heart:  normal rate and regular rhythm.   Abdomen:  soft, non-tender, and normal bowel sounds.   Msk:  no joint tenderness and no joint swelling.   Extremities:  no edema, no erythema  Neurologic:  cranial nerves II-XII intact and strength normal in all extremities.   Skin:  color normal and no rashes.   Psych:  not depressed appearing and moderately anxious.     Impression & Recommendations:  Problem # 1:  Preventive Health Care (ICD-V70.0) Overall doing well, age appropriate education and counseling updated, referral for preventive services and immunizations addressed, dietary counseling and smoking status adressed , most recent labs reviewed, ecg reviewed I have personally reviewed and have noted 1.The patient's medical and social history 2.Their  use of alcohol, tobacco or illicit drugs 3.Their current medications and supplements 4. Functional ability including ADL's, fall risk, home safety risk, hearing & visual impairment  5.Diet and physical activities 6.Evidence for depression or mood disorders The patients weight, height, BMI  have been recorded in the chart I have made referrals, counseling and provided education to the patient based review of the above  Orders: EKG w/ Interpretation (93000)  Complete Medication List: 1)  Cymbalta 60 Mg Cpep (Duloxetine hcl) .Marland Kitchen.. 1 by mouth once daily 2)  Clonazepam 0.5 Mg Tabs (Clonazepam) .Marland Kitchen.. 1 by mouth qam, and 2 by mouth qpm as needed 3)  Synthroid 175 Mcg Tabs (Levothyroxine sodium) .Marland Kitchen.. 1po once daily (daw) 4)  Tylenol Extra Strength 500 Mg Tabs (Acetaminophen) .... 2 tabs by mouth tid 5)  Omeprazole 20 Mg Cpdr (Omeprazole) .Marland Kitchen.. 1po once daily 6)  Hydrochlorothiazide 25 Mg Tabs (Hydrochlorothiazide) .... 1/2 by mouth once daily 7)  Fluticasone Propionate 50 Mcg/act Susp (Fluticasone propionate) .... 2 spray/side once daily 8)  Tramadol Hcl 50 Mg Tabs (Tramadol hcl) .Marland Kitchen.. 1po q 6 hrs as needed pain 9)  Tylenol Arthritis Pain 650 Mg Cr-tabs (Acetaminophen) .... By mouth every 8 hours as needed 10)  Proventil Hfa 108 (90 Base) Mcg/act Aers (Albuterol sulfate) .... 2 puffs four times per day as needed 11)  Nasonex 50 Mcg/act Susp (Mometasone furoate) .... 2 spray/side once daily  Other Orders: Flu Vaccine 58yrs + (16109) Admin 1st Vaccine (60454) T-Bone Densitometry [phone])  Patient Instructions: 1)  you have the cymbalta samples and patient assistance form 2)  you had the flu shot today 3)  please schedule the bone density test before leaving today 4)  Continue all previous medications as before this visit 5)  You are given the discount card as it applies to nasonex (like the flonase) and proventil HFA (which is like the proair [facility]) if this might help , with the prescripitons 6)  You  may want to look into patient assistance programs for symbicort and spiriva as well (you can google these to find the forms) 7)  Please schedule a follow-up appointment in 1 year, or sooner if needed Prescriptions: CYMBALTA 60 MG CPEP (DULOXETINE HCL) 1 by mouth once daily  #30 x 11   Entered and Authorized by:   Corwin Levins MD   Signed by:   Corwin Levins MD on 02/16/2010   Method used:   Print then Give to Patient   RxID:   [id number] NASONEX 50 MCG/ACT SUSP (MOMETASONE FUROATE) 2 spray/side once daily  #1 x 11   Entered and Authorized by:   Len Blalock  John MD   Signed by:   Corwin Levins MD on 02/16/2010   Method used:   Print then Give to Patient   RxID:   [id number] PROVENTIL HFA 108 (90 BASE) MCG/ACT AERS (ALBUTEROL SULFATE) 2 puffs four times per day as needed  #1 x 11   Entered and Authorized by:   Corwin Levins MD   Signed by:   Corwin Levins MD on 02/16/2010   Method used:   Print then Give to Patient   RxID:   [id number] CYMBALTA 60 MG CPEP (DULOXETINE HCL) 1 by mouth once daily  #90 x 3   Entered and Authorized by:   Corwin Levins MD   Signed by:   Corwin Levins MD on 02/16/2010   Method used:   Print then Give to Patient   RxID:   [id number] CYMBALTA 60 MG CPEP (DULOXETINE HCL) 1by mouth once daily  #30 x 0   Entered and Authorized by:   Corwin Levins MD   Signed by:   Corwin Levins MD on 02/16/2010   Method used:   Print then Give to Patient   RxID:   [id number]    Orders Added: 1)  EKG w/ Interpretation [93000] 2)  Flu Vaccine 37yrs + [14481] 3)  Admin 1st Vaccine [90471] 4)  T-Bone Densitometry [77080] 5)  Est. Patient 40-64 years [85631]   Immunizations Administered:  Influenza Vaccine # 1:    Vaccine Type: Fluvax 3+    Site: left deltoid    Mfr: Sanofi Pasteur    Dose: 0.5 ml    Route: IM    Given by: Zella Ball Ewing CMA (AAMA)    Exp. Date: 10/04/2010    Lot #: [id number]    VIS given: 09/29/09 version given February 16, 2010.  Flu  Vaccine Consent Questions:    Do you have a history of severe allergic reactions to this vaccine? no    Any prior history of allergic reactions to egg and/or gelatin? no    Do you have a sensitivity to the preservative Thimersol? no    Do you have a past history of Guillan-Barre Syndrome? no    Do you currently have an acute febrile illness? no    Have you ever had a severe reaction to latex? no    Vaccine information given and explained to patient? yes    Are you currently pregnant? no   Immunizations Administered:  Influenza Vaccine # 1:    Vaccine Type: Fluvax 3+    Site: left deltoid    Mfr: Sanofi Pasteur    Dose: 0.5 ml    Route: IM    Given by: Zella Ball Ewing CMA (AAMA)    Exp. Date: 10/04/2010    Lot #: [id number]    VIS given: 09/29/09 version given February 16, 2010.

## 2010-04-08 NOTE — Progress Notes (Signed)
  Phone Note Other Incoming   Request: Send information Summary of Call: Request for records received from [facility]. Request forwarded to [facility].     

## 2010-04-08 NOTE — Consult Note (Signed)
Summary: [facility]   Imported By: Sherian Rein 01/08/2010 10:44:59  _____________________________________________________________________  External Attachment:    Type:   Image     Comment:   External Document

## 2010-04-08 NOTE — Progress Notes (Signed)
Summary: Orthapedic referral  Phone Note Call from Patient   Summary of Call: Pt called stating she would like to be referred to an Orthapedic doctor about her knee? Doesn't want to be sent to [facility]. Please advise? Initial call taken by: Josph Macho CMA,  April 07, 2009 11:39 AM  Follow-up for Phone Call        ok  - will do Follow-up by: Corwin Levins MD,  April 07, 2009 12:42 PM  Additional Follow-up for Phone Call Additional follow up Details #1::        pt informed Additional Follow-up by: Margaret Pyle, CMA,  April 07, 2009 1:13 PM

## 2010-04-08 NOTE — Progress Notes (Signed)
Summary: Referral  Phone Note Call from Patient Call back at Home Phone [phone]   Caller: Patient Summary of Call: Pt called requesting referral to Dr Danise Edge for colonoscopy. Initial call taken by: Margaret Pyle, CMA,  December 28, 2009 10:10 AM  Follow-up for Phone Call        ok - I will do Follow-up by: Corwin Levins MD,  December 28, 2009 10:15 AM  Additional Follow-up for Phone Call Additional follow up Details #1::        Pt informed Additional Follow-up by: Margaret Pyle, CMA,  December 28, 2009 10:22 AM

## 2010-04-08 NOTE — Miscellaneous (Signed)
Summary: BONE DENSITY  Clinical Lists Changes  Orders: Added new Test order of T-Lumbar Vertebral Assessment (77082) - Signed 

## 2010-04-08 NOTE — Procedures (Signed)
Summary: Colonoscopy / [facility]  Colonoscopy / [facility]   Imported By: Lennie Odor 02/09/2010 16:51:17  _____________________________________________________________________  External Attachment:    Type:   Image     Comment:   External Document

## 2010-05-10 ENCOUNTER — Telehealth: Payer: Self-pay | Admitting: Internal Medicine

## 2010-05-18 NOTE — Progress Notes (Signed)
Summary: medication refill  Phone Note Refill Request Message from:  Fax from Pharmacy on May 10, 2010 10:09 AM  Refills Requested: Medication #1:  CLONAZEPAM 0.5 MG TABS 1 by mouth qam   Dosage confirmed as above?Dosage Confirmed   Last Refilled: 10/07/2009   Notes: [facility] [address] [place], [phone] Initial call taken by: Zella Ball Ewing CMA Duncan Dull),  May 10, 2010 10:10 AM  Follow-up for Phone Call        faxed hardcopy to pharmacy Follow-up by: Robin Ewing CMA Duncan Dull),  May 10, 2010 1:45 PM    Prescriptions: CLONAZEPAM 0.5 MG TABS (CLONAZEPAM) 1 by mouth qam, and 2 by mouth qpm as needed  #90 x 4   Entered and Authorized by:   Corwin Levins MD   Signed by:   Corwin Levins MD on 05/10/2010   Method used:   Print then Give to Patient   RxID:   [id number]  done hardcopy to [facility] side B - dahlia Corwin Levins MD  May 10, 2010 12:12 PM

## 2010-05-25 ENCOUNTER — Telehealth: Payer: Self-pay

## 2010-05-25 NOTE — Telephone Encounter (Signed)
Pt advised that Cymbalta 60 mg has been placed in cabinet for pick up

## 2010-05-25 NOTE — Telephone Encounter (Signed)
Pt called to ask if patient assistance Cymbalta came into office. If not pt would like samples. Samples provided and placed in cabinet for pt pick up

## 2010-05-27 ENCOUNTER — Telehealth: Payer: Self-pay

## 2010-05-27 NOTE — Telephone Encounter (Signed)
Called patient left message the patient assistance medication has arrived and ready for pickup at front desk.

## 2010-07-13 ENCOUNTER — Other Ambulatory Visit: Payer: Self-pay | Admitting: Obstetrics and Gynecology

## 2010-07-13 DIAGNOSIS — N644 Mastodynia: Secondary | ICD-10-CM

## 2010-07-20 NOTE — Op Note (Signed)
NAMELUCRESHA, DISMUKE              ACCOUNT NO.:  [id number]   MEDICAL RECORD NO.:  [id number]          PATIENT TYPE:  OIB   LOCATION:  3523                         FACILITY:  [facility]   PHYSICIAN:  Henry A. Pool, M.D.    DATE OF BIRTH:  [birth year]   DATE OF PROCEDURE:  09/24/2007  DATE OF DISCHARGE:                               OPERATIVE REPORT   PREOPERATIVE DIAGNOSIS:  Right C5-6 spondylosis with radiculopathy.   POSTOPERATIVE DIAGNOSIS:  Right C5-6 spondylosis with radiculopathy.   PROCEDURE NOTE:  C5-6 anterior cervical diskectomy and fusion with  allografting and plating.   SURGEON:  Kathaleen Maser. Pool, MD   ASSISTANT:  Donalee Citrin, MD   ANESTHESIA:  General endotracheal.   INDICATION:  Ms. Binney is a 55 year old female with history of neck  and upper extremity pain, paresthesias and weakness consistent with  right-sided C6 radiculopathy which failed conservative management.  Workup demonstrates evidence of a right-sided C5-6 spondylitic  protrusion causing stenosis and compression of the exiting C6 nerve  root.  We discussed options of her management including operative and  nonoperative care.  The patient decided to proceed with C5-C6 anterior  cervical diskectomy and fusion, allografting and plating in hopes of  improving her symptoms.   OPERATIVE NOTE:  The patient was taken to the operating room and placed  on the operating table in supine position.  After adequate level of  anesthesia was achieved, the patient was positioned supine, placed in  halter traction with her neck slightly extended.  The patient's anterior  cervical region was prepped and draped sterilely.  A 10-blade was used  to make a curvilinear skin incision overlying the C5-6 interspace.  This  was carried down sharply to the platysma.  Platysma was then divided  vertically and dissection proceeded along the medial border of the  sternocleidomastoid muscle and carotid sheath.  Trachea and esophagus  were  mobilized and tracked towards the left.  Prevertebral fascia was  stripped off the anterior spinal column.  The longus coli muscle was  elevated bilaterally using electrocautery.  Deep self-retaining  retractors were placed.  Intraoperative fluoroscopy was used and levels  were confirmed.  Disk space at C5-6 then incised with a 15 blade in a  rectangular fashion.  Wide disk space clean-out was then achieved using  pituitary rongeurs, upward and backward angled Karlin curettes, Kerrison  rongeurs, and high-speed drill.  All elements of the disk were removed  down to the level of the posterior annulus.  Microscope was then brought  to the field and used throughout the remainder of diskectomy.  The  remaining aspects of annulus and osteophytes were removed using high-  speed drill.  Posterior longitudinal ligament was then elevated and  resected in a piecemeal fashion using Kerrison rongeurs.  The underlying  thecal sac was then identified.  A wide central decompression was then  performed undercutting the bodies of C5-C6.  Decompression then  proceeded out at each neural foramen.  Wide anterior foraminotomies were  then performed along the course of exiting C6 nerve roots bilaterally.  At this point, a  very thorough decompression was achieved.  There was no  evidence of injury to thecal sac or nerve root.  The wound was then  irrigated with antibiotic solution.  Gelfoam was placed topically for  hemostasis which was found to be good.  A 5-mm LifeNet allograft wedge  was then packed in place and recessed approximately 1-mm from the  anterior cortical margin.  A 23-mm Atlantis anterior plate was then  placed over the C5 and C6 levels.  This was then attached under  fluoroscopic guidance using 13-mm variable angle screws two each at both  levels.  All four screws were given final tightening and found to be  solid bone.  Locking screws were then engaged at both levels.  Final  images revealed  good position.   Hardware at proper operative level with normal alignment of spine.  The  wound was then irrigated with antibiotic solution.  Hemostasis was  ensured with bipolar cautery.  The wound was then closed in typical  fashion.  Steri-Strips and sterile dressing were applied.  There were no  complications.  The patient tolerated the procedure well and she  returned to recovery room postoperatively.           ______________________________  Kathaleen Maser Pool, M.D.     HAP/MEDQ  D:  09/24/2007  T:  09/25/2007  Job:  [id number]

## 2010-07-20 NOTE — Op Note (Signed)
[name], Amy Hodge              ACCOUNT NO.:  [id number]   MEDICAL RECORD NO.:  [id number]          PATIENT TYPE:  AMB   LOCATION:  [facility]                         FACILITY:  [facility]   PHYSICIAN:  Malachi Pro. Ambrose Mantle, M.D. DATE OF BIRTH:  [birth year]   DATE OF PROCEDURE:  11/23/2007  DATE OF DISCHARGE:                               OPERATIVE REPORT   PREOPERATIVE DIAGNOSIS:  High-grade cervical dysplasia.   POSTOPERATIVE DIAGNOSIS:  High-grade cervical dysplasia.   OPERATION:  D&C conization.   OPERATOR:  Malachi Pro. Ambrose Mantle, MD.   ANESTHESIA:  General anesthesia.   PROCEDURE IN DETAIL:  The patient was brought to the operating room and  placed in lithotomy position.  The vulva was prepped with Betadine  solution.  The patient had been intubated.  She was placed in lithotomy  position and a speculum was placed into the vagina.  The cervix was  visualized, painted with a solution of 5% acetic acid, colposcope was  used, and there were no external cervical abnormalities.  The patient's  Pap smear, it showed a high-grade dysplasia.  Colposcopy in the office  showed no external abnormalities, but an endocervical curettage showed  high-grade cervical dysplasia.  The cervix was injected with a dilute  solution of Pitressin using about 10 mL of a solution mixed with 10  units of Pitressin and a 120 mL of saline.  The cone was then outlined  with the CO2 laser at 10 watts and then with a SuperPulse, the  conization was done.  I did cut off the endocervical margin with  scissors.  I then prepped the cervix with Betadine solution, did an  endocervical curettage, dilated the cervix, did an endometrial  curettage, and then reapproximated the cervix with 4 interrupted sutures  of 0 chromic.  Sounding the uterus at the end of the procedure showed  that the cervical canal was patent.  The uterus only sounded about 6 cm.  Procedure was terminated.  Blood loss less than 10 mL.      Malachi Pro. Ambrose Mantle,  M.D.  Electronically Signed     TFH/MEDQ  D:  11/23/2007  T:  11/24/2007  Job:  [id number]

## 2010-07-20 NOTE — H&P (Signed)
Amy Hodge, Amy Hodge              ACCOUNT NO.:  [id number]   MEDICAL RECORD NO.:  [id number]          PATIENT TYPE:  AMB   LOCATION:  SDC                           FACILITY:  [facility]   PHYSICIAN:  Malachi Pro. Ambrose Mantle, M.D. DATE OF BIRTH:  [birth year]   DATE OF ADMISSION:  01/08/2008  DATE OF DISCHARGE:                              HISTORY & PHYSICAL   PRESENT ILLNESS:  This is a 55 year old white female para 1-0-0-1 who  was admitted to the hospital for abdominal hysterectomy bilateral  salpingo-oophorectomy because of high grade squamous intraepithelial  lesion on conization of the cervix and detached fragment of dysplastic  squamous epithelium on endocervical curettage after the cone was done.  This patient had cryosurgery for cervical dysplasia in 1987.  Thereafter, she had normal Pap smears until 2009 when she had a Pap  smears suggesting high grade squamous intraepithelial lesion.  Colposcopy was unsatisfactory but endocervical curettings were positive  for dysplastic squamous mucosa.  She underwent conization.  The cervical  cone, which was done first, showed high-grade SIL.  The margins were  negative.  However, the endocervical curettings, which were done next,  showed detached fragment of dysplastic squamous epithelium consistent  with high grade SIL endometrial curettings were negative.  Because of  this, the patient was offered repeat conization hysterectomy.  She chose  to proceed with hysterectomy.  She has not had a period since 2005.   ALLERGIES:  TETRACYCLINE AND BACTRIM.   ILLNESSES:  1. High blood pressure.  2. COPD.  3. She has also had a history of Hashimoto's thyroiditis.   OPERATIONS:  1. Tubal ligation.  2. Thyroidectomy.  3. Appendectomy.  4. Conization of the cervix.  5. Shoulder surgery.  6. C-section.   REVIEW OF SYSTEMS:  No heart or visual problems.  No of lung problems of  note.  No bowel or urinary problems.  She does not drink or smoke.  She  is  disabled.   FAMILY HISTORY:  Father is dead at 67 with a heart attack.  Mother died  at 91 with lung cancer.  A 30 year old sister has diabetes and  hypertension.  One child was 53 years old and is healthy.   MEDICATIONS:  Synthroid, metoprolol, Spiriva, Prilosec.   PHYSICAL EXAMINATION:  GENERAL:  Well-developed, well-nourished white  female in no distress.  VITAL SIGNS:  Blood pressure 124/78, pulse 80, weight 189 pounds.  HEENT:  Upper plate.  Extraocular movements are intact.  Nose and  pharynx clear.  HEART:  Normal size and sounds.  No murmurs.  LUNGS:  Clear to auscultation.  BREASTS:  Examined within the last 6 months were negative for tumor or  asymmetry.  ABDOMEN:  Soft and nontender.  No masses are palpable.  HEART:  Normal size and sounds.  No murmurs.  LUNGS:  Clear to auscultation.  ABDOMEN:  There is dermatitis in the right lower quadrant abdominal  crease.  There are no masses in the abdomen.  Liver, spleen and kidneys  are normal.  The vulva and vagina are clean.  The cervix is healing from  her  prior conization which was done on November 23, 2007.  Uterus is  small.  Adnexa are clear.  RECTUM:  Rectal exam within the last 3 months is negative.   ADMITTING IMPRESSION:  Positive endocervical curettage after conization  of the cervix, COPD, history of Hashimoto's thyroiditis.  The patient is  prepared for abdominal hysterectomy, bilateral salpingo-oophorectomy.  She has been informed of the risks of surgery, include but are not  limited to heart attack, stroke, pulmonary embolus, wound disruption,  hemorrhage with need for reoperation and/or transfusion, fistula  formation, nerve injury, intestinal obstruction.  She has also been  informed that the surgery has an unpredictable impact on sex drive and  performance.  She understands and agrees to proceed.      Malachi Pro. Ambrose Mantle, M.D.  Electronically Signed     TFH/MEDQ  D:  01/07/2008  T:  01/08/2008  Job:   [id number]

## 2010-07-20 NOTE — Discharge Summary (Signed)
Amy Hodge, Amy Hodge              ACCOUNT NO.:  [id number]   MEDICAL RECORD NO.:  [id number]          PATIENT TYPE:  OIB   LOCATION:  9309                          [facility]ACILITY:  [facility]   PHYSICIAN:  Malachi Pro. Ambrose Mantle, M.D. DATE OF BIRTH:  1955/07/24   DATE OF ADMISSION:  01/08/2008  DATE OF DISCHARGE:  01/10/2008                               DISCHARGE SUMMARY   This is a 55 year old white female who had conization of the cervix for  high-grade squamous intraepithelial lesion and in the specimen after the  conization was done, the endocervical specimen showed fragments of  dysplastic squamous epithelium high-grade.  The patient was admitted for  abdominal hysterectomy and bilateral salpingo-oophorectomy.  She  underwent that procedure on January 08, 2008.  On the first postop day,  she was not ready for discharge.  On the second postop day, her O2 sats  had been somewhat low.  She had no chest pain, no significant shortness  of breath, so we did get a chest x-ray that showed linear atelectasis at  both bases.  No infiltrate, no evidence of congestive heart failure.  She was on O2 sat sitting down was 92 and after ambulating in the hall  it was 93, so she was felt to be a candidate for discharge.  She has not  passed flatus, but she is tolerating a diet.   LABORATORY DATA:  Comprehensive metabolic profile to be normal.  Estimated GFR was greater than 60.  White count 8600, hemoglobin 14.4,  hematocrit 42.7, platelet count 404,000, 55 segs, 38 lymphs, 5 monos, 1  eosinophil, and 1 basophil.  Followup hemoglobin 12.9, hematocrit 38.9.  Path report showed no residual high-grade squamous intraepithelial  lesion, unremarkable tubes and ovaries.   FINAL DIAGNOSES:  High-grade squamous intraepithelial lesion on  endocervical curettage after conization of the cervix, chronic  obstructive pulmonary disease.   OPERATIONS:  Abdominal hysterectomy, bilateral salpingo-oophorectomy   FINAL CONDITION:   Improved.   INSTRUCTIONS:  No vaginal entrance, no heavy lifting or strenuous  activity.  Call with any temperature elevation greater than 100.4  degrees.  Call with any unusual problems.  Return to my office in 1-4  days to have her staples removed.  Percocet 5/325, 36 tablets 1 every 4-  6 hours needed for pain is given at discharge.  The patient is to  continue her albuterol inhaler, metoprolol 25 mg one-half tablet a day,  Synthroid 150 mcg daily, lorazepam 0.5 mg as needed, loratadine over-the-  counter as needed, Spiriva and inhaler daily, and Pristiq 50 mg daily.      Malachi Pro. Ambrose Mantle, M.D.  Electronically Signed     TFH/MEDQ  D:  01/10/2008  T:  01/11/2008  Job:  [id number]

## 2010-07-20 NOTE — Op Note (Signed)
NAMECHELSEY, REDONDO              ACCOUNT NO.:  [id number]   MEDICAL RECORD NO.:  [id number]          PATIENT TYPE:  OIB   LOCATION:  9309                          [facility]ILITY:  [facility]   PHYSICIAN:  Malachi Pro. Ambrose Mantle, M.D. DATE OF BIRTH:  1955-12-21   DATE OF PROCEDURE:  01/08/2008  DATE OF DISCHARGE:                               OPERATIVE REPORT   PREOPERATIVE DIAGNOSIS:  Cervical intraepithelial neoplasia III in  endocervical curettings after conization of the cervix.   POSTOPERATIVE DIAGNOSIS:  Cervical intraepithelial neoplasia III in  endocervical curettings after conization of the cervix.   OPERATIONS:  1. Abdominal hysterectomy.  2. Bilateral salpingo-oophorectomy.   OPERATOR:  Malachi Pro. Ambrose Mantle, MD.   ASSISTANTSherron Monday, MD.   ANESTHESIA:  General anesthesia.   PROCEDURE:  The patient was brought to the operating room and placed  under satisfactory general anesthesia.  She was placed in frog-leg  position.  The abdomen was prepped with Betadine solution.  The vagina  was prepped.  The urethra was prepped and the Foley catheter was  inserted to straight drain.  The patient was placed supine.  The abdomen  was draped as a sterile field.  A transverse incision was made above the  crease and carried in layers through the skin, subcutaneous tissue, and  fascia.  The fascia was separated from the rectus muscle inferiorly and  superiorly.  The peritoneum was opened bluntly and then vertically.  X-  rays of the upper abdomen revealed the liver to be smooth.  The  gallbladder was quite tensely cystic.  I did not feel any stones.  I did  not feel the right kidney.  I felt part of the left kidney.  No upper  abdominal abnormalities were noted.  Exploration of the pelvis revealed  the uterus to be quite small.  The tubes had been previously ligated.  The ovaries appeared normal.  There were no significant adhesions.  A  Balfour retractor and packs were used to prepare the  operative field.  Clamps were placed across both upper pedicles.  The round ligaments were  divided with the electrical current.  Bladder flap was developed, then  the tubes and ovaries were identified, drawn up with Babcock clamps.  A  right angle clamp was placed through a avascular window beneath the  infundibulopelvic ligament and the infundibulopelvic ligaments  bilaterally divided between clamps and doubly suture ligated.  The  uterine vessels were skeletonized.  I actually entered the uterine  vessel on the left.  Clamped, cut, and suture ligated the uterine  vessels, then the parametrial tissues were clamped, cut, and suture  ligated.  The uterosacral ligaments were divided and suture ligated and  held.  Vaginal angles were entered and then the uterus was removed by  transecting the upper vagina.  I did see a piece of chromic suture at  the left cervicovaginal junction, so I removed more tissue in this area  and when I did the area that had been cut, sort of duct back down into  the vagina.  I tried to be sure that I  got below this and then sutured  both angles and the central portion of the vagina with interrupted  figure-of-eight sutures of 0 Vicryl.  Liberal irrigation confirmed  hemostasis.  The uterosacral ligaments were to sutured together in the  midline.  Reperitonealization was done across the cuff.  Diligent search  was made for hemostasis.  There was no bleeding.  Packs and retractors  were removed.  The peritoneum was closed with running suture 0 Vicryl.  Rectus muscle with interrupted 0 Vicryl, the fascia with 2 running  sutures of 0 Vicryl, subcu with a running 3-0 Vicryl, and the skin was  closed with automatic staples.  After the dressing was applied, I placed  the patient in frog-leg position.  I examined the cuff with a great  speculum and there was no bleeding.  The patient was returned to  recovery in satisfactory condition.  Blood loss was estimated 100 mL.   Please note that prior to closure of abdomen, I did palpate both  ureters.  There were normal in size and felt to be well below the  operative field.      Malachi Pro. Ambrose Mantle, M.D.  Electronically Signed     TFH/MEDQ  D:  01/08/2008  T:  01/09/2008  Job:  [id number]

## 2010-07-23 NOTE — Op Note (Signed)
NAMEKENNIDEE, HEYNE              ACCOUNT NO.:  [id number]   MEDICAL RECORD NO.:  [id number]          PATIENT TYPE:  AMB   LOCATION:  DSC                          FACILITY:  [facility]   PHYSICIAN:  Feliberto Gottron. Turner Daniels, M.D.   DATE OF BIRTH:  February 21, 1956   DATE OF PROCEDURE:  04/03/2006  DATE OF DISCHARGE:  03/22/2006                               OPERATIVE REPORT   PREOPERATIVE DIAGNOSIS:  Right shoulder impingement and partial tear of  the subscapularis.   POSTOPERATIVE DIAGNOSIS:  Right shoulder impingement and partial tear of  the subscapularis.   PROCEDURE:  Right shoulder arthroscopic anterior-inferior acromioplasty,  distal clavicle spur excision, and debridement about 5% of the  insertional fibers of the subscapularis right shoulder.   SURGEON:  Feliberto Gottron. Turner Daniels, M.D.   ASSISTANT:  Skip Mayer PA-C.   ANESTHETIC:  Was interscalene block with general endotracheal.   ESTIMATED BLOOD LOSS:  Minimal.   FLUID REPLACEMENT:  Was 800 mL crystalloid.   DRAINS PLACED:  None.   TOURNIQUET TIME:  None.   INDICATIONS FOR PROCEDURE:  The patient is a 55 year old female with  right shoulder impingement syndrome.  No overt rotator cuff tear by MRI  scan who has failed conservative treatment, anti-inflammatory medicines,  physical therapy and job modifications.  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  Risks and benefits of  surgery discussed and she is prepared for surgical intervention.   DESCRIPTION OF PROCEDURE:  The patient identified by armband, underwent  right shoulder interscalene block at the [facility].  She was then taken to the operating room where the  appropriate site monitors were attached and general  endotracheal  anesthesia induced.  She was then placed in the beach chair position and  the right upper extremity prepped, draped in sterile fashion from the  wrist to the hemithorax.  Using an #11 blade we then made standard  portals 1.5 cm anterior to the [facility] joint lateral the junction middle and  posterior thirds acromion, posterior the posterolateral corner acromion  process.  The inflow was placed anteriorly.  The arthroscope laterally  and 4.5 great white sucker shaver posteriorly allowing subacromial  bursectomy.  This outlined the subclavicular and subacromial spurs which  were then removed with a 4.5 hooded vortex bur, allowing [name] to better  evaluate the rotator cuff which did have some very mild external fraying  that required very light debridement. The arthroscope was then  repositioned into the glenohumeral joint where we identified some  partial tearing of the insertional fibers of the subscapularis. The  biceps and biceps anchor were intact.  The articular cartilage of the  humeral head and glenoid were intact as well. Subscapularis fibers were  debrided. Photographic documentation  made of the intact rotator cuff. The arthroscopic consents removed and a  dressing  of Xeroform 4x4 dressing, sponges, Hypafix tape, and a sling  applied.  The patient was then awakened, extubated and taken to the  recovery room without difficulty.      Feliberto Gottron. Turner Daniels, M.D.  Electronically Signed     FJR/MEDQ  D:  04/03/2006  T:  04/03/2006  Job:  [id number]

## 2010-10-05 ENCOUNTER — Other Ambulatory Visit: Payer: Self-pay | Admitting: Obstetrics and Gynecology

## 2010-10-05 ENCOUNTER — Ambulatory Visit
Admission: RE | Admit: 2010-10-05 | Discharge: 2010-10-05 | Disposition: A | Discharge status: Home / Self Care | Payer: No Typology Code available for payment source | Source: Ambulatory Visit | Attending: Obstetrics and Gynecology | Admitting: Obstetrics and Gynecology

## 2010-10-05 DIAGNOSIS — N644 Mastodynia: Secondary | ICD-10-CM

## 2010-11-13 ENCOUNTER — Other Ambulatory Visit: Payer: Self-pay | Admitting: Internal Medicine

## 2010-12-03 LAB — BASIC METABOLIC PANEL
BUN: 13
Calcium: 9.5
Chloride: 104
Creatinine, Ser: 0.7
GFR calc Af Amer: >60
GFR calc non Af Amer: >60

## 2010-12-03 LAB — DIFFERENTIAL
Basophils Absolute: 0.1
Eosinophils Relative: 2
Lymphocytes Relative: 38
Monocytes Absolute: 0.4

## 2010-12-03 LAB — CBC
MCV: 84.2
Platelets: 272
RBC: 4.96
WBC: 6.5

## 2010-12-03 LAB — ABO/RH: ABO/RH(D): A POS

## 2010-12-06 LAB — COMPREHENSIVE METABOLIC PANEL
Alkaline Phosphatase: 93
BUN: 9
CO2: 25
Chloride: 109
GFR calc non Af Amer: >60
Glucose, Bld: 81
Potassium: 4.2
Total Bilirubin: 0.7
Total Protein: 6.7

## 2010-12-06 LAB — DIFFERENTIAL
Basophils Absolute: 0
Basophils Relative: 1
Neutro Abs: 3.2
Neutrophils Relative %: 49

## 2010-12-06 LAB — CBC
HCT: 40.9
Hemoglobin: 13.8
RDW: 13.7

## 2010-12-07 LAB — COMPREHENSIVE METABOLIC PANEL
ALT: 12
AST: 18
Alkaline Phosphatase: 79
CO2: 28
GFR calc Af Amer: >60
GFR calc non Af Amer: >60
Glucose, Bld: 90
Potassium: 3.6
Sodium: 138
Total Protein: 7.2

## 2010-12-07 LAB — CBC
Hemoglobin: 14.4
MCHC: 33.2
MCV: 87.4
Platelets: 342
RBC: 4.45
RBC: 4.97
WBC: 8.6

## 2010-12-07 LAB — DIFFERENTIAL
Basophils Relative: 1
Eosinophils Absolute: 0.1
Eosinophils Relative: 1
Monocytes Relative: 5
Neutrophils Relative %: 55

## 2010-12-22 ENCOUNTER — Telehealth: Payer: Self-pay | Admitting: *Deleted

## 2010-12-22 MED ORDER — CLONAZEPAM 0.5 MG PO TABS: ORAL_TABLET | ORAL | Status: DC

## 2010-12-22 NOTE — Telephone Encounter (Signed)
Done hardcopy to dahlia/LIM B  

## 2010-12-22 NOTE — Telephone Encounter (Signed)
Pt is out of Clonazepam and is requesting a refill for medication be sent in to her pharmacy to last until she come in for appointment. Pt has upcoming appointment on 12/31/2010-please advise

## 2010-12-23 NOTE — Telephone Encounter (Signed)
Rx faxed to pharmacy  

## 2010-12-25 ENCOUNTER — Encounter: Payer: Self-pay | Admitting: Internal Medicine

## 2010-12-25 DIAGNOSIS — Z0000 Encounter for general adult medical examination without abnormal findings: Secondary | ICD-10-CM | POA: Insufficient documentation

## 2010-12-27 ENCOUNTER — Other Ambulatory Visit: Payer: Self-pay

## 2010-12-27 ENCOUNTER — Telehealth: Payer: Self-pay

## 2010-12-27 DIAGNOSIS — Z0000 Encounter for general adult medical examination without abnormal findings: Secondary | ICD-10-CM

## 2010-12-27 NOTE — Telephone Encounter (Signed)
Pt called requesting labs done prior to upcoming appt 10/26. Last CPX 02/2010.

## 2010-12-27 NOTE — Telephone Encounter (Signed)
Done per emr 

## 2010-12-28 ENCOUNTER — Other Ambulatory Visit: Payer: Self-pay | Admitting: Internal Medicine

## 2010-12-28 ENCOUNTER — Other Ambulatory Visit (INDEPENDENT_AMBULATORY_CARE_PROVIDER_SITE_OTHER): Payer: No Typology Code available for payment source

## 2010-12-28 ENCOUNTER — Other Ambulatory Visit: Payer: No Typology Code available for payment source

## 2010-12-28 DIAGNOSIS — Z0000 Encounter for general adult medical examination without abnormal findings: Secondary | ICD-10-CM

## 2010-12-28 LAB — LIPID PANEL
Cholesterol: 248 mg/dL — ABNORMAL HIGH (ref 0–200)
Total CHOL/HDL Ratio: 5
Triglycerides: 153 mg/dL — ABNORMAL HIGH (ref 0.0–149.0)

## 2010-12-28 LAB — CBC WITH DIFFERENTIAL/PLATELET
Basophils Relative: 0.7 % (ref 0.0–3.0)
Eosinophils Absolute: 0.1 10*3/uL (ref 0.0–0.7)
Eosinophils Relative: 1.7 % (ref 0.0–5.0)
Hemoglobin: 14 g/dL (ref 12.0–15.0)
Lymphocytes Relative: 38.8 % (ref 12.0–46.0)
MCHC: 34.1 g/dL (ref 30.0–36.0)
Monocytes Relative: 5.8 % (ref 3.0–12.0)
Neutro Abs: 3.1 10*3/uL (ref 1.4–7.7)
Neutrophils Relative %: 53 % (ref 43.0–77.0)
RBC: 4.66 Mil/uL (ref 3.87–5.11)
WBC: 5.9 10*3/uL (ref 4.5–10.5)

## 2010-12-28 LAB — BASIC METABOLIC PANEL
BUN: 18 mg/dL (ref 6–23)
CO2: 27 mEq/L (ref 19–32)
Calcium: 9 mg/dL (ref 8.4–10.5)
Chloride: 107 mEq/L (ref 96–112)
Creatinine, Ser: 0.7 mg/dL (ref 0.4–1.2)
Glucose, Bld: 77 mg/dL (ref 70–99)

## 2010-12-28 LAB — URINALYSIS, ROUTINE W REFLEX MICROSCOPIC
Nitrite: NEGATIVE
Specific Gravity, Urine: 1.02 (ref 1.000–1.030)
Total Protein, Urine: NEGATIVE
Urine Glucose: NEGATIVE
Urobilinogen, UA: 0.2 (ref 0.0–1.0)

## 2010-12-28 LAB — HEPATIC FUNCTION PANEL
ALT: 11 U/L (ref 0–35)
Albumin: 4.3 g/dL (ref 3.5–5.2)
Bilirubin, Direct: 0 mg/dL (ref 0.0–0.3)
Total Protein: 7.2 g/dL (ref 6.0–8.3)

## 2010-12-28 LAB — LDL CHOLESTEROL, DIRECT: Direct LDL: 161.8 mg/dL

## 2010-12-31 ENCOUNTER — Encounter: Payer: Self-pay | Admitting: Internal Medicine

## 2010-12-31 ENCOUNTER — Ambulatory Visit (INDEPENDENT_AMBULATORY_CARE_PROVIDER_SITE_OTHER): Payer: No Typology Code available for payment source | Admitting: Internal Medicine

## 2010-12-31 VITALS — BP 112/80 | HR 73 | Temp 97.9°F | Ht 66.0 in | Wt 171.5 lb

## 2010-12-31 DIAGNOSIS — J441 Chronic obstructive pulmonary disease with (acute) exacerbation: Secondary | ICD-10-CM | POA: Insufficient documentation

## 2010-12-31 DIAGNOSIS — Z0000 Encounter for general adult medical examination without abnormal findings: Secondary | ICD-10-CM

## 2010-12-31 DIAGNOSIS — Z23 Encounter for immunization: Secondary | ICD-10-CM

## 2010-12-31 MED ORDER — HYDROCHLOROTHIAZIDE 25 MG PO TABS: 12.5000 mg | ORAL_TABLET | Freq: Every day | ORAL | Status: DC

## 2010-12-31 MED ORDER — ALBUTEROL SULFATE HFA 108 (90 BASE) MCG/ACT IN AERS: 2.0000 | INHALATION_SPRAY | Freq: Four times a day (QID) | RESPIRATORY_TRACT | Status: DC | PRN

## 2010-12-31 MED ORDER — FLUTICASONE PROPIONATE 50 MCG/ACT NA SUSP: 2.0000 | Freq: Every day | NASAL | Status: DC

## 2010-12-31 MED ORDER — OMEPRAZOLE 20 MG PO CPDR: 20.0000 mg | DELAYED_RELEASE_CAPSULE | Freq: Every day | ORAL | Status: DC

## 2010-12-31 MED ORDER — BECLOMETHASONE DIPROPIONATE 80 MCG/ACT IN AERS: 1.0000 | INHALATION_SPRAY | RESPIRATORY_TRACT | Status: DC | PRN

## 2010-12-31 MED ORDER — LEVOTHYROXINE SODIUM 175 MCG PO TABS: 175.0000 ug | ORAL_TABLET | Freq: Every day | ORAL | Status: DC

## 2010-12-31 MED ORDER — METHYLPREDNISOLONE ACETATE 80 MG/ML IJ SUSP
120.0000 mg | Freq: Once | INTRAMUSCULAR | Status: AC
  Administered 2010-12-31: 120 mg via INTRAMUSCULAR

## 2010-12-31 NOTE — Progress Notes (Signed)
Subjective:    Patient ID: Amy Hodge, female    DOB: Nov 03, 1955, 55 y.o.   MRN: [id number]  HPI  Here for wellness and f/u;  Overall doing ok;  Pt denies CP, worsening SOB, DOE, wheezing, orthopnea, PND, worsening LE edema, palpitations, dizziness or syncope, though has some baseline sob not always improved as well with the proair;  Has to use about 3-4 times per wk,  Not able to afford steroid inhaler.  Pt denies neurological change such as new Headache, facial or extremity weakness.  Pt denies polydipsia, polyuria, or low sugar symptoms. Pt states overall good compliance with treatment and medications, good tolerability, and trying to follow lower cholesterol diet.  Pt denies worsening depressive symptoms, suicidal ideation or panic. No fever, wt loss, night sweats, loss of appetite, or other constitutional symptoms.  Pt states good ability with ADL's, low fall risk, home safety reviewed and adequate, no significant changes in hearing or vision, and occasionally active with exercise.   No acute complaints.  Finances very tight - lost her home to foreclosure. Past Medical History  Diagnosis Date  . GERD (gastroesophageal reflux disease)   . Anxiety   . Depression   . Hypothyroidism   . Headache   . Allergy   . Hyperlipidemia   . COPD (chronic obstructive pulmonary disease)   . DJD (degenerative joint disease) of knee     RT  . DDD (degenerative disc disease), cervical   . COPD exacerbation 12/31/2010   Past Surgical History  Procedure Date  . Tubal ligation 1984  . Appendectomy 1971  . Thyroidectomy 1996  . Tonsillectomy 1965  . Abdominal hysterectomy   . Oophorectomy   . Total abdominal hysterectomy w/ bilateral salpingoophorectomy 01/2008    with cervical dysplasia  . Shoulder surgery     RT    reports that she has quit smoking. She does not have any smokeless tobacco history on file. She reports that she does not drink alcohol or use illicit drugs. family history includes  Diabetes in her father; Heart disease in her father; and Hypertension in her father. Allergies  Allergen Reactions  . Levofloxacin     REACTION: hives  . Pravastatin Sodium     REACTION: myalgia  . Tetracycline     REACTION: hives   Current Outpatient Prescriptions on File Prior to Visit  Medication Sig Dispense Refill  . acetaminophen (TYLENOL) 500 MG tablet Take 1,000 mg by mouth every 8 (eight) hours as needed.        . clonazePAM (KLONOPIN) 0.5 MG tablet 1 tab by mouth in the AM, and 2 in the PM as needed - to fill Dec 23, 2010  90 tablet  0  . DULoxetine (CYMBALTA) 60 MG capsule Take 60 mg by mouth daily.         Review of Systems Review of Systems  Constitutional: Negative for diaphoresis, activity change, appetite change and unexpected weight change.  HENT: Negative for hearing loss, ear pain, facial swelling, mouth sores and neck stiffness.   Eyes: Negative for pain, redness and visual disturbance.  Respiratory: Negative for shortness of breath and wheezing.   Cardiovascular: Negative for chest pain and palpitations.  Gastrointestinal: Negative for diarrhea, blood in stool, abdominal distention and rectal pain.  Genitourinary: Negative for hematuria, flank pain and decreased urine volume.  Musculoskeletal: Negative for myalgias and joint swelling.  Skin: Negative for color change and wound.  Neurological: Negative for syncope and numbness.  Hematological: Negative for adenopathy.  Psychiatric/Behavioral: Negative for hallucinations, self-injury, decreased concentration and agitation.      Objective:   Physical Exam BP 112/80  Pulse 73  Temp(Src) 97.9 F (36.6 C) (Oral)  Ht 5\' 6"  (1.676 m)  Wt 171 lb 8 oz (77.792 kg)  BMI 27.68 kg/m2  SpO2 96% Physical Exam  VS noted Constitutional: Pt is oriented to person, place, and time. Appears well-developed and well-nourished.  HENT:  Head: Normocephalic and atraumatic.  Right Ear: External ear normal.  Left Ear: External  ear normal.  Nose: Nose normal.  Mouth/Throat: Oropharynx is clear and moist.  Eyes: Conjunctivae and EOM are normal. Pupils are equal, round, and reactive to light.  Neck: Normal range of motion. Neck supple. No JVD present. No tracheal deviation present.  Cardiovascular: Normal rate, regular rhythm, normal heart sounds and intact distal pulses.   Pulmonary/Chest: Effort normal and breath sounds mild decrease/mild wheeze Abdominal: Soft. Bowel sounds are normal. There is no tenderness.  Musculoskeletal: Normal range of motion. Exhibits no edema.  Lymphadenopathy:  Has no cervical adenopathy.  Neurological: Pt is alert and oriented to person, place, and time. Pt has normal reflexes. No cranial nerve deficit.  Skin: Skin is warm and dry. No rash noted.  Psychiatric:  Has  normal mood and affect. Behavior is normal. 1+ nervous    Assessment & Plan:

## 2010-12-31 NOTE — Assessment & Plan Note (Addendum)
Mild, to re-start the qvar asd; pt assist form filled out

## 2010-12-31 NOTE — Patient Instructions (Addendum)
You had the flu shot today, and the steroid shot as well Please follow lower cholesterol diet You are given the qvar sample and prescription, and all the hardcopy refills today Please have the pharmacy call if you need further refills The Qvar patient assistance form was filled out today Continue all other medications as before Please return in 1 year for your yearly visit, or sooner if needed

## 2011-01-01 ENCOUNTER — Encounter: Payer: Self-pay | Admitting: Internal Medicine

## 2011-01-01 NOTE — Assessment & Plan Note (Signed)

## 2011-02-01 ENCOUNTER — Telehealth: Payer: Self-pay | Admitting: Internal Medicine

## 2011-02-01 MED ORDER — CLONAZEPAM 0.5 MG PO TABS: ORAL_TABLET | ORAL | Status: DC

## 2011-02-01 NOTE — Telephone Encounter (Signed)
Done hardcopy to robin  

## 2011-02-02 NOTE — Telephone Encounter (Signed)
Faxed hardcopy to  [facility]

## 2011-02-23 ENCOUNTER — Telehealth: Payer: Self-pay

## 2011-02-23 NOTE — Telephone Encounter (Signed)
A user error has taken place: encounter opened in error, closed for administrative reasons.

## 2011-03-18 ENCOUNTER — Other Ambulatory Visit: Payer: Self-pay | Admitting: Internal Medicine

## 2011-05-18 ENCOUNTER — Telehealth: Payer: Self-pay

## 2011-05-18 NOTE — Telephone Encounter (Signed)
Patient called requesting patient assistance to be completed for refills on Cymbalta 60 mg.  Patient is almost out of this medication.  The patient is to pickup samples (6 boxes #7 each) at front desk.  Will call the patient to complete form.

## 2011-08-05 ENCOUNTER — Telehealth: Payer: Self-pay | Admitting: Internal Medicine

## 2011-08-05 NOTE — Telephone Encounter (Signed)
Please inform pt I checked google online -   At cymbalta.com she can get a free 30 day coupon if she has a printer to print off the coupon  Also, she can search "cymbalta patient assistance" to get to the site to print the form to apply for free cymbalta  Please let me know if she is not able to do this

## 2011-08-05 NOTE — Telephone Encounter (Signed)
Caller: Latoria/Patient is calling with a question about Cymbalta.The medication was written by Oliver Barre. Pt calling regaridng Cymbalta Rx. Pt has no more and new Rx costs $175. Pt states she's had pt assistance in the past or samples. Inquiring if there is any means for help to get this med; comperable med, samples, discount care, anthing. Pharmacy is [facility] [phone]- [address] [facility]. Pt would like a call back.

## 2011-08-05 NOTE — Telephone Encounter (Signed)
Called the patient informed of MD's instructions.  She stated she would do so and call back .

## 2011-08-05 NOTE — Telephone Encounter (Signed)
Yes, there are generic other types of medication, but I was hoping she would do this first

## 2011-08-05 NOTE — Telephone Encounter (Signed)
Called informed of instructions per MD for Cymbalta.  The patient did want to know if unable to continue getting through patient assistance is there an alternative or can she go off cymbalta.  Please advise

## 2011-08-08 ENCOUNTER — Telehealth: Payer: Self-pay | Admitting: Internal Medicine

## 2011-08-08 NOTE — Telephone Encounter (Signed)
Caller: Nyaisha/Mother; Phone Number: [phone]; Message from caller: Pt wants Dr Melvyn Novas nurse to know that she has  the Patient Assistance program application for Cymbalta with Rx voucher and wants Zella Ball to call her back at [phone]

## 2011-08-08 NOTE — Telephone Encounter (Signed)
Ok - to robin 

## 2011-08-09 MED ORDER — DULOXETINE HCL 60 MG PO CPEP: 60.0000 mg | ORAL_CAPSULE | Freq: Every day | ORAL | Status: DC

## 2011-08-09 NOTE — Telephone Encounter (Signed)
Called the patient back and she is faxing form for Dr. Jonny Ruiz to complete.  The patient did print out voucher for #30 days free of Cymbalta.  Please send a prescription to her pharmacy [facility]) Cymbalta 60 mg #30 in order to get her 30 days free as she is out. Please advise

## 2011-08-09 NOTE — Telephone Encounter (Signed)
Called the patient informed rx sent  In to [facility] and that form had been received.  Would have it completed and fax in and let her know asap once get response.

## 2011-08-09 NOTE — Telephone Encounter (Signed)
rx to [facility] done

## 2011-08-11 ENCOUNTER — Other Ambulatory Visit: Payer: Self-pay | Admitting: Internal Medicine

## 2011-08-11 MED ORDER — DULOXETINE HCL 60 MG PO CPEP: 60.0000 mg | ORAL_CAPSULE | Freq: Every day | ORAL | Status: DC

## 2011-08-11 NOTE — Telephone Encounter (Signed)
Mailed form and rx to the patient as she is out of town and needs to include with application proof of income.  Mailed to [address], [place], [place] [address].  Made a copy of form for our files.

## 2011-08-11 NOTE — Telephone Encounter (Signed)
Done for purpose of pt assist program app

## 2011-08-24 ENCOUNTER — Telehealth: Payer: Self-pay

## 2011-08-24 NOTE — Telephone Encounter (Signed)
Called the patient to inform received approval letter for patient assistance for Cymbalta from [employer]..  Informed per letter info. That medication would be shipped to our office in approximately 4 weeks.

## 2011-09-21 ENCOUNTER — Other Ambulatory Visit: Payer: Self-pay

## 2011-09-21 MED ORDER — CLONAZEPAM 0.5 MG PO TABS: ORAL_TABLET | ORAL | Status: DC

## 2011-09-21 NOTE — Telephone Encounter (Signed)
Done hardcopy to robin  

## 2011-09-21 NOTE — Telephone Encounter (Signed)
Faxed hardcopy to pharmacy. 

## 2013-04-08 DIAGNOSIS — I2510 Atherosclerotic heart disease of native coronary artery without angina pectoris: Secondary | ICD-10-CM

## 2013-05-15 ENCOUNTER — Encounter (HOSPITAL_COMMUNITY): Payer: Self-pay | Admitting: Emergency Medicine

## 2013-05-15 ENCOUNTER — Emergency Department (HOSPITAL_COMMUNITY): Payer: Medicare PPO

## 2013-05-15 ENCOUNTER — Inpatient Hospital Stay (HOSPITAL_COMMUNITY)
Admission: EM | Admit: 2013-05-15 | Discharge: 2013-05-20 | DRG: 264 | Disposition: A | Discharge status: Home / Self Care | Payer: Medicare PPO | Attending: Internal Medicine | Admitting: Internal Medicine

## 2013-05-15 DIAGNOSIS — Z87891 Personal history of nicotine dependence: Secondary | ICD-10-CM | POA: Diagnosis not present

## 2013-05-15 DIAGNOSIS — J45901 Unspecified asthma with (acute) exacerbation: Secondary | ICD-10-CM

## 2013-05-15 DIAGNOSIS — Z7982 Long term (current) use of aspirin: Secondary | ICD-10-CM

## 2013-05-15 DIAGNOSIS — R413 Other amnesia: Secondary | ICD-10-CM

## 2013-05-15 DIAGNOSIS — F411 Generalized anxiety disorder: Secondary | ICD-10-CM | POA: Diagnosis present

## 2013-05-15 DIAGNOSIS — R7989 Other specified abnormal findings of blood chemistry: Secondary | ICD-10-CM

## 2013-05-15 DIAGNOSIS — Z9861 Coronary angioplasty status: Secondary | ICD-10-CM | POA: Diagnosis not present

## 2013-05-15 DIAGNOSIS — Z8679 Personal history of other diseases of the circulatory system: Secondary | ICD-10-CM

## 2013-05-15 DIAGNOSIS — R51 Headache: Secondary | ICD-10-CM

## 2013-05-15 DIAGNOSIS — R778 Other specified abnormalities of plasma proteins: Secondary | ICD-10-CM

## 2013-05-15 DIAGNOSIS — J449 Chronic obstructive pulmonary disease, unspecified: Secondary | ICD-10-CM | POA: Diagnosis not present

## 2013-05-15 DIAGNOSIS — E7800 Pure hypercholesterolemia, unspecified: Secondary | ICD-10-CM | POA: Diagnosis present

## 2013-05-15 DIAGNOSIS — K219 Gastro-esophageal reflux disease without esophagitis: Secondary | ICD-10-CM

## 2013-05-15 DIAGNOSIS — I214 Non-ST elevation (NSTEMI) myocardial infarction: Secondary | ICD-10-CM

## 2013-05-15 DIAGNOSIS — J441 Chronic obstructive pulmonary disease with (acute) exacerbation: Secondary | ICD-10-CM

## 2013-05-15 DIAGNOSIS — I2510 Atherosclerotic heart disease of native coronary artery without angina pectoris: Secondary | ICD-10-CM | POA: Diagnosis not present

## 2013-05-15 DIAGNOSIS — Z8249 Family history of ischemic heart disease and other diseases of the circulatory system: Secondary | ICD-10-CM | POA: Diagnosis not present

## 2013-05-15 DIAGNOSIS — IMO0002 Reserved for concepts with insufficient information to code with codable children: Secondary | ICD-10-CM

## 2013-05-15 DIAGNOSIS — I2582 Chronic total occlusion of coronary artery: Secondary | ICD-10-CM | POA: Diagnosis not present

## 2013-05-15 DIAGNOSIS — S61209A Unspecified open wound of unspecified finger without damage to nail, initial encounter: Secondary | ICD-10-CM | POA: Diagnosis not present

## 2013-05-15 DIAGNOSIS — J4489 Other specified chronic obstructive pulmonary disease: Secondary | ICD-10-CM

## 2013-05-15 DIAGNOSIS — R5383 Other fatigue: Secondary | ICD-10-CM

## 2013-05-15 DIAGNOSIS — S61409A Unspecified open wound of unspecified hand, initial encounter: Secondary | ICD-10-CM | POA: Diagnosis present

## 2013-05-15 DIAGNOSIS — M549 Dorsalgia, unspecified: Secondary | ICD-10-CM

## 2013-05-15 DIAGNOSIS — M25569 Pain in unspecified knee: Secondary | ICD-10-CM

## 2013-05-15 DIAGNOSIS — J309 Allergic rhinitis, unspecified: Secondary | ICD-10-CM

## 2013-05-15 DIAGNOSIS — F3289 Other specified depressive episodes: Secondary | ICD-10-CM | POA: Diagnosis present

## 2013-05-15 DIAGNOSIS — R5381 Other malaise: Secondary | ICD-10-CM

## 2013-05-15 DIAGNOSIS — E785 Hyperlipidemia, unspecified: Secondary | ICD-10-CM

## 2013-05-15 DIAGNOSIS — N959 Unspecified menopausal and perimenopausal disorder: Secondary | ICD-10-CM

## 2013-05-15 DIAGNOSIS — R799 Abnormal finding of blood chemistry, unspecified: Secondary | ICD-10-CM

## 2013-05-15 DIAGNOSIS — I10 Essential (primary) hypertension: Secondary | ICD-10-CM

## 2013-05-15 DIAGNOSIS — T07XXXA Unspecified multiple injuries, initial encounter: Secondary | ICD-10-CM

## 2013-05-15 DIAGNOSIS — I319 Disease of pericardium, unspecified: Secondary | ICD-10-CM

## 2013-05-15 DIAGNOSIS — M1710 Unilateral primary osteoarthritis, unspecified knee: Secondary | ICD-10-CM

## 2013-05-15 DIAGNOSIS — I200 Unstable angina: Secondary | ICD-10-CM

## 2013-05-15 DIAGNOSIS — R079 Chest pain, unspecified: Secondary | ICD-10-CM

## 2013-05-15 DIAGNOSIS — L989 Disorder of the skin and subcutaneous tissue, unspecified: Secondary | ICD-10-CM

## 2013-05-15 DIAGNOSIS — I219 Acute myocardial infarction, unspecified: Secondary | ICD-10-CM | POA: Diagnosis not present

## 2013-05-15 DIAGNOSIS — F329 Major depressive disorder, single episode, unspecified: Secondary | ICD-10-CM | POA: Diagnosis present

## 2013-05-15 DIAGNOSIS — R062 Wheezing: Secondary | ICD-10-CM

## 2013-05-15 DIAGNOSIS — E559 Vitamin D deficiency, unspecified: Secondary | ICD-10-CM

## 2013-05-15 DIAGNOSIS — G894 Chronic pain syndrome: Secondary | ICD-10-CM

## 2013-05-15 DIAGNOSIS — R609 Edema, unspecified: Secondary | ICD-10-CM

## 2013-05-15 DIAGNOSIS — W540XXA Bitten by dog, initial encounter: Secondary | ICD-10-CM

## 2013-05-15 DIAGNOSIS — G43009 Migraine without aura, not intractable, without status migrainosus: Secondary | ICD-10-CM

## 2013-05-15 DIAGNOSIS — Z0000 Encounter for general adult medical examination without abnormal findings: Secondary | ICD-10-CM

## 2013-05-15 DIAGNOSIS — E039 Hypothyroidism, unspecified: Secondary | ICD-10-CM | POA: Diagnosis present

## 2013-05-15 LAB — BASIC METABOLIC PANEL
BUN: 16 mg/dL (ref 6–23)
CO2: 22 meq/L (ref 19–32)
CREATININE: 0.67 mg/dL (ref 0.50–1.10)
Calcium: 9.2 mg/dL (ref 8.4–10.5)
Chloride: 102 mEq/L (ref 96–112)
GFR calc Af Amer: >90 mL/min (ref >90)
GFR calc non Af Amer: >90 mL/min (ref >90)
Glucose, Bld: 85 mg/dL (ref 70–99)
Potassium: 3.9 mEq/L (ref 3.7–5.3)
Sodium: 141 mEq/L (ref 137–147)

## 2013-05-15 LAB — CK TOTAL AND CKMB (NOT AT ARMC)
CK TOTAL: 132 U/L (ref 7–177)
CK, MB: 6 ng/mL — AB (ref 0.3–4.0)
RELATIVE INDEX: 4.5 — AB (ref 0.0–2.5)

## 2013-05-15 LAB — CBC
HCT: 38.9 % (ref 36.0–46.0)
Hemoglobin: 13.2 g/dL (ref 12.0–15.0)
MCH: 28.4 pg (ref 26.0–34.0)
MCHC: 33.9 g/dL (ref 30.0–36.0)
MCV: 83.8 fL (ref 78.0–100.0)
Platelets: 190 10*3/uL (ref 150–400)
RBC: 4.64 MIL/uL (ref 3.87–5.11)
RDW: 13 % (ref 11.5–15.5)
WBC: 6.8 10*3/uL (ref 4.0–10.5)

## 2013-05-15 LAB — I-STAT TROPONIN, ED: Troponin i, poc: 0.13 ng/mL (ref 0.00–0.08)

## 2013-05-15 LAB — TROPONIN I: Troponin I: 1.5 ng/mL (ref <0.30)

## 2013-05-15 MED ORDER — SODIUM CHLORIDE 0.9 % IV BOLUS (SEPSIS)
250.0000 mL | Freq: Once | INTRAVENOUS | Status: AC
  Administered 2013-05-15: 250 mL via INTRAVENOUS

## 2013-05-15 MED ORDER — AMOXICILLIN-POT CLAVULANATE 875-125 MG PO TABS
1.0000 | ORAL_TABLET | Freq: Once | ORAL | Status: AC
  Administered 2013-05-15: 1 via ORAL
  Filled 2013-05-15: qty 1

## 2013-05-15 MED ORDER — ALBUTEROL SULFATE (2.5 MG/3ML) 0.083% IN NEBU: 2.5000 mg | INHALATION_SOLUTION | RESPIRATORY_TRACT | Status: DC | PRN

## 2013-05-15 MED ORDER — SODIUM CHLORIDE 0.9 % IJ SOLN
3.0000 mL | Freq: Two times a day (BID) | INTRAMUSCULAR | Status: DC
  Administered 2013-05-16: 3 mL via INTRAVENOUS

## 2013-05-15 MED ORDER — SODIUM CHLORIDE 0.9 % IJ SOLN: 3.0000 mL | INTRAMUSCULAR | Status: DC | PRN

## 2013-05-15 MED ORDER — CARVEDILOL 3.125 MG PO TABS
3.1250 mg | ORAL_TABLET | Freq: Two times a day (BID) | ORAL | Status: DC
  Administered 2013-05-16 – 2013-05-19 (×6): 3.125 mg via ORAL
  Filled 2013-05-15 (×11): qty 1

## 2013-05-15 MED ORDER — SIMVASTATIN 10 MG PO TABS
10.0000 mg | ORAL_TABLET | Freq: Every day | ORAL | Status: DC
  Filled 2013-05-15: qty 1

## 2013-05-15 MED ORDER — ACETAMINOPHEN 325 MG PO TABS: 650.0000 mg | ORAL_TABLET | Freq: Four times a day (QID) | ORAL | Status: DC | PRN

## 2013-05-15 MED ORDER — ALBUTEROL SULFATE (2.5 MG/3ML) 0.083% IN NEBU: 2.5000 mg | INHALATION_SOLUTION | Freq: Four times a day (QID) | RESPIRATORY_TRACT | Status: DC | PRN

## 2013-05-15 MED ORDER — HYDROCHLOROTHIAZIDE 25 MG PO TABS
12.5000 mg | ORAL_TABLET | Freq: Every day | ORAL | Status: DC
  Filled 2013-05-15: qty 0.5

## 2013-05-15 MED ORDER — CLOPIDOGREL BISULFATE 75 MG PO TABS
75.0000 mg | ORAL_TABLET | Freq: Every day | ORAL | Status: DC
  Administered 2013-05-16 – 2013-05-19 (×3): 75 mg via ORAL
  Filled 2013-05-15 (×7): qty 1

## 2013-05-15 MED ORDER — LORAZEPAM 2 MG/ML IJ SOLN
1.0000 mg | Freq: Once | INTRAMUSCULAR | Status: AC
  Administered 2013-05-15: 1 mg via INTRAVENOUS
  Filled 2013-05-15: qty 1

## 2013-05-15 MED ORDER — DULOXETINE HCL 60 MG PO CPEP
60.0000 mg | ORAL_CAPSULE | Freq: Every day | ORAL | Status: DC
  Administered 2013-05-16 – 2013-05-19 (×3): 60 mg via ORAL
  Filled 2013-05-15 (×5): qty 1

## 2013-05-15 MED ORDER — HYDROCODONE-ACETAMINOPHEN 5-325 MG PO TABS
1.0000 | ORAL_TABLET | ORAL | Status: DC | PRN
  Administered 2013-05-15 – 2013-05-16 (×2): 1 via ORAL
  Filled 2013-05-15 (×2): qty 1

## 2013-05-15 MED ORDER — ASPIRIN 325 MG PO TABS
325.0000 mg | ORAL_TABLET | Freq: Once | ORAL | Status: DC
  Filled 2013-05-15: qty 1

## 2013-05-15 MED ORDER — ACETAMINOPHEN 500 MG PO TABS: 1000.0000 mg | ORAL_TABLET | Freq: Three times a day (TID) | ORAL | Status: DC | PRN

## 2013-05-15 MED ORDER — PANTOPRAZOLE SODIUM 40 MG PO TBEC
40.0000 mg | DELAYED_RELEASE_TABLET | Freq: Every day | ORAL | Status: DC
  Administered 2013-05-16 – 2013-05-19 (×3): 40 mg via ORAL
  Filled 2013-05-15 (×2): qty 1

## 2013-05-15 MED ORDER — HEPARIN SODIUM (PORCINE) 5000 UNIT/ML IJ SOLN
5000.0000 [IU] | Freq: Three times a day (TID) | INTRAMUSCULAR | Status: DC
  Administered 2013-05-16 – 2013-05-19 (×9): 5000 [IU] via SUBCUTANEOUS
  Filled 2013-05-15 (×16): qty 1

## 2013-05-15 MED ORDER — SODIUM CHLORIDE 0.9 % IV SOLN: 250.0000 mL | INTRAVENOUS | Status: DC | PRN

## 2013-05-15 MED ORDER — HYDROMORPHONE HCL PF 1 MG/ML IJ SOLN
0.5000 mg | INTRAMUSCULAR | Status: DC | PRN
  Administered 2013-05-16 – 2013-05-17 (×8): 0.5 mg via INTRAVENOUS
  Filled 2013-05-15 (×10): qty 1

## 2013-05-15 MED ORDER — COLCHICINE 0.6 MG PO TABS
0.6000 mg | ORAL_TABLET | Freq: Two times a day (BID) | ORAL | Status: DC
  Administered 2013-05-16 – 2013-05-19 (×8): 0.6 mg via ORAL
  Filled 2013-05-15 (×11): qty 1

## 2013-05-15 MED ORDER — SODIUM CHLORIDE 0.9 % IJ SOLN
3.0000 mL | Freq: Two times a day (BID) | INTRAMUSCULAR | Status: DC
  Administered 2013-05-16 – 2013-05-19 (×6): 3 mL via INTRAVENOUS

## 2013-05-15 MED ORDER — ASPIRIN 81 MG PO CHEW
162.0000 mg | CHEWABLE_TABLET | Freq: Once | ORAL | Status: AC
  Administered 2013-05-15: 162 mg via ORAL
  Filled 2013-05-15: qty 2

## 2013-05-15 MED ORDER — ASPIRIN EC 81 MG PO TBEC
81.0000 mg | DELAYED_RELEASE_TABLET | Freq: Every day | ORAL | Status: DC
  Administered 2013-05-16 – 2013-05-19 (×3): 81 mg via ORAL
  Filled 2013-05-15 (×5): qty 1

## 2013-05-15 MED ORDER — FLUTICASONE PROPIONATE HFA 44 MCG/ACT IN AERO
1.0000 | INHALATION_SPRAY | Freq: Two times a day (BID) | RESPIRATORY_TRACT | Status: DC
  Filled 2013-05-15 (×2): qty 10.6

## 2013-05-15 MED ORDER — ACETAMINOPHEN 650 MG RE SUPP
650.0000 mg | Freq: Four times a day (QID) | RECTAL | Status: DC | PRN
Start: 2013-05-15 — End: 2013-05-20

## 2013-05-15 MED ORDER — LEVOTHYROXINE SODIUM 175 MCG PO TABS
175.0000 ug | ORAL_TABLET | Freq: Every day | ORAL | Status: DC
  Administered 2013-05-16 – 2013-05-19 (×3): 175 ug via ORAL
  Filled 2013-05-15 (×7): qty 1

## 2013-05-15 MED ORDER — ONDANSETRON HCL 4 MG/2ML IJ SOLN
4.0000 mg | Freq: Once | INTRAMUSCULAR | Status: AC
Start: 2013-05-15 — End: 2013-05-15
  Administered 2013-05-15: 4 mg via INTRAVENOUS
  Filled 2013-05-15: qty 2

## 2013-05-15 MED ORDER — SODIUM CHLORIDE 0.9 % IV SOLN
3.0000 g | Freq: Four times a day (QID) | INTRAVENOUS | Status: DC
  Administered 2013-05-16 – 2013-05-20 (×17): 3 g via INTRAVENOUS
  Filled 2013-05-15 (×23): qty 3

## 2013-05-15 NOTE — ED Notes (Signed)
Pt started vomiting, Collier Salina PA at bedside. Pt very pale, HR dropped to 47, BP 71/55. Sats 88%. Pt placed on 2L [place], Admitting MD paged.

## 2013-05-15 NOTE — ED Notes (Signed)
Pt hands and arms soaked and rinsed off with a mixture of NS and hydrogen peroxide.

## 2013-05-15 NOTE — ED Notes (Signed)
Admitting MD at bedside.

## 2013-05-15 NOTE — Consult Note (Signed)
Admit date: 05/15/2013 Primary Physician  Cathlean Cower, MD Primary Cardiologist  Dr. Driscilla Grammes in [place]  CC: Chest pain  HPI: 58 year-old female with coronary artery disease status post percutaneous intervention at both [place], [place] and [place], 4 separate stents with reported long, complicated procedures lasting 3 and 4 hours here with chest pain and worry after bulldog bite on both hands.    Recently over the past 2 weeks, she was treated with colchicine and NSAIDs for pericarditis and was told that she had a small pericardial effusion. This occurred after the second cardiac catheterization at [facility] which resulted in myocardial infarction. She has had continuous chest pain since then. She was going to see her cardiologist this Friday. They were visiting her daughter here in [place].   Story today is that a bulldog attack occurred when she tried to free her small dog from the bulldog's jaw. She began to have worry and worsening chest pain subsequently. She came to the emergency room. Troponin here point-of-care was 0.13. ER physician performed a bedside echo which he stated had trivial pericardial effusion.  Currently she is chest pain-free. Family at bedside. Tearful.    PMH:   Past Medical History  Diagnosis Date  . GERD (gastroesophageal reflux disease)   . Anxiety   . Depression   . Hypothyroidism   . Headache(784.0)   . Allergy   . Hyperlipidemia   . COPD (chronic obstructive pulmonary disease)   . DJD (degenerative joint disease) of knee     RT  . DDD (degenerative disc disease), cervical   . COPD exacerbation 12/31/2010    PSH:   Past Surgical History  Procedure Laterality Date  . Tubal ligation  1984  . Appendectomy  1971  . Thyroidectomy  1996  . Tonsillectomy  1965  . Abdominal hysterectomy    . Oophorectomy    . Total abdominal hysterectomy w/ bilateral salpingoophorectomy  01/2008    with cervical dysplasia  . Shoulder surgery      RT     Allergies:  Levofloxacin; Morphine and related; Tetracycline; and Bactrim Prior to Admit Meds:   Prior to Admission medications   Medication Sig Start Date End Date Taking? Authorizing Provider  acetaminophen (TYLENOL) 500 MG tablet Take 1,000 mg by mouth every 8 (eight) hours as needed for mild pain.    Yes Historical Provider, MD  albuterol (PROVENTIL HFA;VENTOLIN HFA) 108 (90 BASE) MCG/ACT inhaler Inhale 2 puffs into the lungs every 6 (six) hours as needed for wheezing or shortness of breath.   Yes Historical Provider, MD  aspirin EC 81 MG tablet Take 81 mg by mouth daily.   Yes Historical Provider, MD  beclomethasone (QVAR) 80 MCG/ACT inhaler Inhale 1 puff into the lungs daily as needed (shortness of breath).   Yes Historical Provider, MD  carvedilol (COREG) 3.125 MG tablet Take 3.125 mg by mouth 2 (two) times daily with a meal.   Yes Historical Provider, MD  Clopidogrel Bisulfate (PLAVIX PO) Take 60 mg by mouth daily.   Yes Historical Provider, MD  colchicine 0.6 MG tablet Take 0.6 mg by mouth 2 (two) times daily.   Yes Historical Provider, MD  DULoxetine (CYMBALTA) 60 MG capsule Take 1 capsule (60 mg total) by mouth daily. 08/11/11  Yes Biagio Borg, MD  hydrochlorothiazide (HYDRODIURIL) 25 MG tablet Take 12.5 mg by mouth daily.   Yes Historical Provider, MD  ibuprofen (ADVIL,MOTRIN) 800 MG tablet Take 800 mg by mouth 3 (  three) times daily.   Yes Historical Provider, MD  levothyroxine (SYNTHROID) 175 MCG tablet Take 1 tablet (175 mcg total) by mouth daily. 12/31/10  Yes Biagio Borg, MD  omeprazole (PRILOSEC) 20 MG capsule Take 1 capsule (20 mg total) by mouth daily. 12/31/10  Yes Biagio Borg, MD  pravastatin (PRAVACHOL) 40 MG tablet Take 40 mg by mouth every evening.   Yes Historical Provider, MD  beclomethasone (QVAR) 80 MCG/ACT inhaler Inhale 1 puff into the lungs as needed. 12/31/10 12/31/11  Biagio Borg, MD   Fam HX:    Family History  Problem Relation Age of Onset  . Diabetes  Father   . Hypertension Father   . Heart disease Father    Social HX:    History   Social History  . Marital Status: Married    Spouse Name: N/A    Number of Children: 1  . Years of Education: N/A   Occupational History  .     Social History Main Topics  . Smoking status: Former Research scientist (life sciences)  . Smokeless tobacco: Not on file  . Alcohol Use: No  . Drug Use: No  . Sexual Activity:    Other Topics Concern  . Not on file   Social History Narrative   On worker's comp for lower back and right shoulder - applying for disability; losing [employer] insurance 03/07/2010     ROS:  Denies any fevers, chills, orthopnea, PND, syncope, bleeding, rash. She has noted some ankle edema this morning. All 11 ROS were addressed and are negative except what is stated in the HPI   Physical Exam: Blood pressure 138/81, pulse 96, resp. rate 20, SpO2 97.00%.   General: Well developed, well nourished, in no acute distress Head: Eyes PERRLA, No xanthomas.   Normal cephalic and atramatic  Lungs:  Clear bilaterally to auscultation and percussion. Normal respiratory effort. No wheezes, no rales. Heart:  HRRR S1 S2 Pulses are 2+ & equal. No murmurs, rubs or gallops.             No carotid bruit. No JVD.  No abdominal bruits. Abdomen: Bowel sounds are positive, abdomen soft and non-tender without masses or                 Hernia's noted. No hepatosplenomegaly. Msk:  Back normal, normal gait. Normal strength and tone for age. Extremities:  No clubbing, cyanosis or edema.  DP +1, bilateral hands with multiple puncture wounds, bleeding. Neuro: Alert and oriented X 3, non-focal, MAE x 4 GU: Deferred Rectal: Deferred Psych:  Good affect, responds appropriately, tearful         Labs:   Lab Results  Component Value Date   WBC 6.8 05/15/2013   HGB 13.2 05/15/2013   HCT 38.9 05/15/2013   MCV 83.8 05/15/2013   PLT 190 05/15/2013    Recent Labs Lab 05/15/13 1702  NA 141  K 3.9  CL 102  CO2 22  BUN 16    CREATININE 0.67  CALCIUM 9.2  GLUCOSE 85   No results found for this basename: CKTOTAL, CKMB, TROPONINI,  in the last 72 hours Lab Results  Component Value Date   CHOL 248* 12/28/2010   HDL 50.00 12/28/2010   TRIG 153.0* 12/28/2010   Troponin 0.13, mildly elevated.   Radiology:  No results found. Personally viewed.   EKG:  05/15/13-sinus rhythm, T-wave inversion in inferior leads, subtle in lead 3. Change since EKG on 09/05/2007. Personally viewed.  ASSESSMENT/PLAN:  58 year old female with coronary artery disease status post 4 prior stents placed in [place] and [place] with COPD, hypertension, hyperlipidemia, recent diagnosis of pericarditis on colchicine and NSAIDs here with chest discomfort while visiting [place] now with mildly elevated point-of-care troponin of 0.13.  1. Elevated troponin-it is possible that this is still residual troponin from prior myocardial infarction occurred during previous stent placement in February. This could also be mildly elevated troponin in the setting of pericarditis. Another possibility includes further acute coronary syndrome. She sounds like she had a complicated cardiac catheterization both in [place] and in [place]. She has been treated recently for pericarditis with both colchicine and ibuprofen. She has reported that she has had continuous chest pain since mid February after last stent placement.  For now, I would not utilize heparin unless troponin comes back markedly positive. She continues to have oozing from her multiple puncture wounds from her bull dog bites. Also if pericardial effusion is still present, this could result in hemorrhagic transformation. I will check an echocardiogram.  For now, I would like to gather more data, check serum troponin as well as CK, MB. This would be helpful. The nurse in the emergency department also is assisting on obtaining outside hospital records, cardiac cath, discharge summaries. If she  does require cardiac catheterization, this will be helpful.  She will be admitted through the hospitalist service and we will continue to monitor her progression. We will continue with dual antiplatelet therapy, colchicine and ibuprofen.   2. Dog bites-per primary team.  3. COPD-stable. She hasn't smoked in 15 years.  4. Hyperlipidemia-continue with statin.  5. Hypertension-stable. Continue current medications.  Candee Furbish, MD  05/15/2013  6:04 PM

## 2013-05-15 NOTE — ED Notes (Signed)
Dr. Darl Householder notified of elevated troponin 17:13

## 2013-05-15 NOTE — ED Provider Notes (Signed)
CSN: [id number]     Arrival date & time 05/15/13  1649 History   First MD Initiated Contact with Patient 05/15/13 1723     Chief Complaint  Patient presents with  . Animal Bite  . Chest Pain     (Consider location/radiation/quality/duration/timing/severity/associated sxs/prior Treatment) The history is provided by the patient.  Amy Hodge is a 58 y.o. female hx of GERD, 3 cardiac stents here with chest pain, dog bite. She had 3 cardiac stents recently.  She also diagnosed with pericarditis last week and started on colchicine and Motrin. Today she was walking the dog and the neighbors pitbull came and bit her hand. She suddenly has worse chest pain. Denies worsening shortness of breath and denies leg swelling. Denies any fevers. Tetanus updated a year ago.     Past Medical History  Diagnosis Date  . GERD (gastroesophageal reflux disease)   . Anxiety   . Depression   . Hypothyroidism   . Headache(784.0)   . Allergy   . Hyperlipidemia   . COPD (chronic obstructive pulmonary disease)   . DJD (degenerative joint disease) of knee     RT  . DDD (degenerative disc disease), cervical   . COPD exacerbation 12/31/2010   Past Surgical History  Procedure Laterality Date  . Tubal ligation  1984  . Appendectomy  1971  . Thyroidectomy  1996  . Tonsillectomy  1965  . Abdominal hysterectomy    . Oophorectomy    . Total abdominal hysterectomy w/ bilateral salpingoophorectomy  01/2008    with cervical dysplasia  . Shoulder surgery      RT   Family History  Problem Relation Age of Onset  . Diabetes Father   . Hypertension Father   . Heart disease Father    History  Substance Use Topics  . Smoking status: Former Research scientist (life sciences)  . Smokeless tobacco: Not on file  . Alcohol Use: No   OB History   Grav Para Term Preterm Abortions TAB SAB Ect Mult Living                 Review of Systems  Cardiovascular: Positive for chest pain.  Musculoskeletal:       Hand pain   All other  systems reviewed and are negative.      Allergies  Levofloxacin; Morphine and related; Tetracycline; and Bactrim  Home Medications   Current Outpatient Rx  Name  Route  Sig  Dispense  Refill  . acetaminophen (TYLENOL) 500 MG tablet   Oral   Take 1,000 mg by mouth every 8 (eight) hours as needed for mild pain.          Marland Kitchen albuterol (PROVENTIL HFA;VENTOLIN HFA) 108 (90 BASE) MCG/ACT inhaler   Inhalation   Inhale 2 puffs into the lungs every 6 (six) hours as needed for wheezing or shortness of breath.         Marland Kitchen aspirin EC 81 MG tablet   Oral   Take 81 mg by mouth daily.         . beclomethasone (QVAR) 80 MCG/ACT inhaler   Inhalation   Inhale 1 puff into the lungs daily as needed (shortness of breath).         . carvedilol (COREG) 3.125 MG tablet   Oral   Take 3.125 mg by mouth 2 (two) times daily with a meal.         . Clopidogrel Bisulfate (PLAVIX PO)   Oral   Take 60 mg by mouth  daily.         . colchicine 0.6 MG tablet   Oral   Take 0.6 mg by mouth 2 (two) times daily.         . DULoxetine (CYMBALTA) 60 MG capsule   Oral   Take 1 capsule (60 mg total) by mouth daily.   90 capsule   3   . hydrochlorothiazide (HYDRODIURIL) 25 MG tablet   Oral   Take 12.5 mg by mouth daily.         Marland Kitchen ibuprofen (ADVIL,MOTRIN) 800 MG tablet   Oral   Take 800 mg by mouth 3 (three) times daily.         Marland Kitchen levothyroxine (SYNTHROID) 175 MCG tablet   Oral   Take 1 tablet (175 mcg total) by mouth daily.   30 tablet   11   . omeprazole (PRILOSEC) 20 MG capsule   Oral   Take 1 capsule (20 mg total) by mouth daily.   30 capsule   11   . pravastatin (PRAVACHOL) 40 MG tablet   Oral   Take 40 mg by mouth every evening.         Marland Kitchen EXPIRED: beclomethasone (QVAR) 80 MCG/ACT inhaler   Inhalation   Inhale 1 puff into the lungs as needed.   3 Inhaler   3    BP 142/98  Pulse 99  Resp 20  Ht 5' 6.5" (1.689 m)  Wt 170 lb (77.111 kg)  BMI 27.03 kg/m2  SpO2  97% Physical Exam  Nursing note and vitals reviewed. Constitutional: She is oriented to person, place, and time.  Anxious uncomfortable   HENT:  Head: Normocephalic.  Mouth/Throat: Oropharynx is clear and moist.  Eyes: Conjunctivae are normal. Pupils are equal, round, and reactive to light.  Neck: Normal range of motion. Neck supple.  Cardiovascular: Normal rate, regular rhythm and normal heart sounds.   No rub   Pulmonary/Chest: Effort normal and breath sounds normal. No respiratory distress. She has no wheezes. She has no rales.  Abdominal: Soft. Bowel sounds are normal. She exhibits no distension. There is no tenderness. There is no rebound and no guarding.  Musculoskeletal:  Bite marks on bilateral hands. No obvious foreign body   Neurological: She is alert and oriented to person, place, and time. No cranial nerve deficit. Coordination normal.  Skin: Skin is warm and dry.  Psychiatric: She has a normal mood and affect. Her behavior is normal. Judgment and thought content normal.    ED Course  Procedures (including critical care time)  CRITICAL CARE Performed by: Darl Householder, DAVID   Total critical care time:30 min   Critical care time was exclusive of separately billable procedures and treating other patients.  Critical care was necessary to treat or prevent imminent or life-threatening deterioration.  Critical care was time spent personally by me on the following activities: development of treatment plan with patient and/or surrogate as well as nursing, discussions with consultants, evaluation of patient's response to treatment, examination of patient, obtaining history from patient or surrogate, ordering and performing treatments and interventions, ordering and review of laboratory studies, ordering and review of radiographic studies, pulse oximetry and re-evaluation of patient's condition.   Wound care I irrigated multiple dog wounds with saline. No obvious foreign bodies. No  complications.     EMERGENCY DEPARTMENT [name] CARDIAC EXAM "Study: Limited Ultrasound of the heart and pericardium"  INDICATIONS:Dyspnea Multiple views of the heart and pericardium are obtained with a multi-frequency probe.  PERFORMED YE:[id number]  IMAGES ARCHIVED?: Yes  FINDINGS: Small effusion  LIMITATIONS:  Emergent procedure  VIEWS USED: Subcostal 4 chamber, Parasternal long axis, Parasternal short axis and Apical 4 chamber   INTERPRETATION: Cardiac activity present, Pericardial effusion present and Cardiac tamponade absent  COMMENT:  Small effusion, no tamponade    Labs Review Labs Reviewed  I-STAT TROPOININ, ED - Abnormal; Notable for the following:    Troponin i, poc 0.13 (*)    All other components within normal limits  CBC  BASIC METABOLIC PANEL  TROPONIN I  TROPONIN I  TROPONIN I  TROPONIN I  CK TOTAL AND CKMB   Imaging Review Dg Chest 2 View  05/15/2013   CLINICAL DATA Animal bite.  Chest pain.  EXAM CHEST  2 VIEW  COMPARISON DG CHEST 2 VIEW dated 01/10/2008  FINDINGS Cardiopericardial silhouette within normal limits. Mediastinal contours normal. Trachea midline. No airspace disease or effusion. Basilar atelectasis. Left mid lung atelectasis or scarring. No displaced rib fractures or pneumothorax. Surgical clips in the thoracic inlet. ACDF.  IMPRESSION Basilar atelectasis.  No acute cardiopulmonary disease.  SIGNATURE  Electronically Signed   By: Dereck Ligas M.D.   On: 05/15/2013 19:04   Dg Hand Complete Left  05/15/2013   CLINICAL DATA Dog bite.  EXAM LEFT HAND - COMPLETE 3+ VIEW  COMPARISON None.  FINDINGS Anatomic alignment. No fracture. Basal joint of the thumb osteoarthritis. No radiopaque foreign body. Skin irregularity is present over the dorsum of the hand, which may be posttraumatic.  IMPRESSION No acute osseous abnormality.  SIGNATURE  Electronically Signed   By: Dereck Ligas M.D.   On: 05/15/2013 19:05   Dg Hand Complete Right  05/15/2013   CLINICAL  DATA Dog bite.  EXAM RIGHT HAND - COMPLETE 3+ VIEW  COMPARISON None.  FINDINGS Ulnar deviation of the small finger is probably positional. There is no fracture identified. Soft tissue swelling of the ulnar side of the hand is present with edema in the soft tissues. No fracture or radiopaque foreign body identified.  IMPRESSION No acute osseous abnormality.  SIGNATURE  Electronically Signed   By: Dereck Ligas M.D.   On: 05/15/2013 19:06     EKG Interpretation   Date/Time:  Wednesday May 15 2013 16:55:35 EDT Ventricular Rate:  97 PR Interval:  156 QRS Duration: 90 QT Interval:  332 QTC Calculation: 421 R Axis:   56 Text Interpretation:  Normal sinus rhythm Possible Anterior infarct , age  undetermined T wave abnormality, consider inferior ischemia Abnormal ECG  TWI lateral leads new since previous Confirmed by YAO  MD, DAVID ([id number])  on 05/15/2013 5:17:11 PM      MDM   Final diagnoses:  CAD (coronary artery disease)  Elevated troponin  COPD (chronic obstructive pulmonary disease)  Hypertension  Hyperlipidemia  Dog bite   Amy Hodge is a 58 y.o. female here with chest pain, dog bite. Will get xray to r/o retained foreign bodies. EKG showed new changes. Trop positive. I called Dr. Marlou Porch to evaluate. I also ordered ASA, heparin, augmentin. I performed bedside US that showed minimal effusion, no tamponade. No need for rabies (pit bull was neighbor's dog, up to date with shots, no abnormal behavior).    7:12 PM Dr. Marlou Porch saw patient, recommend medical admission. Wants to hold off on heparin and get echo.      Wandra Arthurs, MD 05/15/13 [phone]

## 2013-05-15 NOTE — ED Notes (Signed)
Hospitalist at bedside, Repeat EKG done.

## 2013-05-15 NOTE — ED Notes (Signed)
Pt has multiple bit marks along arms. Most notibly along pointer finger of left hand, pinkie finger of right hand, left forearm, right hand area. Pt c/o chest pain when she lays down.

## 2013-05-15 NOTE — ED Notes (Signed)
Spoke with Cardiologist about episode. No new orders. Will continue to monitor.

## 2013-05-15 NOTE — H&P (Signed)
[facility]                                                                                    Patient Demographics  Amy Hodge, is a 58 y.o. female  CSN: [id number]  MRN: [id number]  DOB - 10/05/1955  Admit Date - 05/15/2013  Outpatient Primary MD for the patient is Cathlean Cower, MD   With History of -  Past Medical History  Diagnosis Date  . GERD (gastroesophageal reflux disease)   . Anxiety   . Depression   . Hypothyroidism   . Headache(784.0)   . Allergy   . Hyperlipidemia   . COPD (chronic obstructive pulmonary disease)   . DJD (degenerative joint disease) of knee     RT  . DDD (degenerative disc disease), cervical   . COPD exacerbation 12/31/2010      Past Surgical History  Procedure Laterality Date  . Tubal ligation  1984  . Appendectomy  1971  . Thyroidectomy  1996  . Tonsillectomy  1965  . Abdominal hysterectomy    . Oophorectomy    . Total abdominal hysterectomy w/ bilateral salpingoophorectomy  01/2008    with cervical dysplasia  . Shoulder surgery      RT    in for   Chief Complaint  Patient presents with  . Animal Bite  . Chest Pain     HPI  Amy Hodge  is a 58 y.o. female, with past medical history significant for coronary artery disease status post MI in February 2015 and stent placement who is presenting with a dog bite followed by 5 minutes history of chest pain retrocardiac, pressure-like, radiating to show the left shoulder. It went away on its own. It was similar to the episode when she had a heart attack. The patient had a complicated pericarditis and was on Motrin and colchicine and just stopped the colchicine yesterday. Denies any chest pains at the moment but reports having swelling in her lower extremities in the last few days with many episodes of chest pain. Cardiology saw the patient in ER and decided not to start heparin drip at this time. I was called to admit after her troponin was slightly elevated at  0.13    Review of Systems    In addition to the HPI above,  No Fever-chills, No Headache, No changes with Vision or hearing, No problems swallowing food or Liquids, No Abdominal pain, No Nausea or Vommitting, Bowel movements are regular, No Blood in stool or Urine, No dysuria, No new joints pains-aches,  No new weakness, tingling, numbness in any extremity, No recent weight gain or loss, No polyuria, polydypsia or polyphagia, No significant Mental Stressors.  A full 10 point Review of Systems was done, except as stated above, all other Review of Systems were negative.   Social History History  Substance Use Topics  . Smoking status: Former Research scientist (life sciences)  . Smokeless tobacco: Not on file  . Alcohol Use: No     Family History Family History  Problem Relation Age of Onset  . Diabetes Father   . Hypertension Father   . Heart disease Father      Prior  to Admission medications   Medication Sig Start Date End Date Taking? Authorizing Provider  acetaminophen (TYLENOL) 500 MG tablet Take 1,000 mg by mouth every 8 (eight) hours as needed for mild pain.    Yes Historical Provider, MD  albuterol (PROVENTIL HFA;VENTOLIN HFA) 108 (90 BASE) MCG/ACT inhaler Inhale 2 puffs into the lungs every 6 (six) hours as needed for wheezing or shortness of breath.   Yes Historical Provider, MD  aspirin EC 81 MG tablet Take 81 mg by mouth daily.   Yes Historical Provider, MD  beclomethasone (QVAR) 80 MCG/ACT inhaler Inhale 1 puff into the lungs daily as needed (shortness of breath).   Yes Historical Provider, MD  carvedilol (COREG) 3.125 MG tablet Take 3.125 mg by mouth 2 (two) times daily with a meal.   Yes Historical Provider, MD  Clopidogrel Bisulfate (PLAVIX PO) Take 60 mg by mouth daily.   Yes Historical Provider, MD  colchicine 0.6 MG tablet Take 0.6 mg by mouth 2 (two) times daily.   Yes Historical Provider, MD  DULoxetine (CYMBALTA) 60 MG capsule Take 1 capsule (60 mg total) by mouth daily.  08/11/11  Yes Biagio Borg, MD  hydrochlorothiazide (HYDRODIURIL) 25 MG tablet Take 12.5 mg by mouth daily.   Yes Historical Provider, MD  ibuprofen (ADVIL,MOTRIN) 800 MG tablet Take 800 mg by mouth 3 (three) times daily.   Yes Historical Provider, MD  levothyroxine (SYNTHROID) 175 MCG tablet Take 1 tablet (175 mcg total) by mouth daily. 12/31/10  Yes Biagio Borg, MD  omeprazole (PRILOSEC) 20 MG capsule Take 1 capsule (20 mg total) by mouth daily. 12/31/10  Yes Biagio Borg, MD  pravastatin (PRAVACHOL) 40 MG tablet Take 40 mg by mouth every evening.   Yes Historical Provider, MD  beclomethasone (QVAR) 80 MCG/ACT inhaler Inhale 1 puff into the lungs as needed. 12/31/10 12/31/11  Biagio Borg, MD    Allergies  Allergen Reactions  . Levofloxacin     REACTION: hives  . Morphine And Related   . Tetracycline     REACTION: hives  . Bactrim [Sulfamethoxazole-Trimethoprim] Anxiety and Other (See Comments)    Racing heart    Physical Exam  Vitals  Blood pressure 142/98, pulse 99, resp. rate 20, height 5' 6.5" (1.689 m), weight 77.111 kg (170 lb), SpO2 97.00%.   1. General elderly white female in no acute distress at the moment  2. Normal affect and insight, Not Suicidal or Homicidal, Awake Alert, Oriented X 3.  3. No F.N deficits, ALL C.Nerves Intact, Strength 5/5 all 4 extremities, Sensation intact all 4 extremities, Plantars down going.  4. Ears and Eyes appear Normal, Conjunctivae clear, PERRLA. Moist Oral Mucosa.  5. Supple Neck, No JVD, No cervical lymphadenopathy appriciated, No Carotid Bruits.  6. Symmetrical Chest wall movement, Good air movement bilaterally, CTAB.  7. RRR, No Gallops, Rubs or Murmurs, No Parasternal Heave.  8. Positive Bowel Sounds, Abdomen Soft, Non tender, No organomegaly appriciated,No rebound -guarding or rigidity.  9.  No Cyanosis, Normal Skin Turgor, No Skin Rash or Bruise.  10. Multiple lacerations and hands and wrists  11. No Palpable Lymph Nodes  in Neck or Axillae    Data Review  CBC  Recent Labs Lab 05/15/13 1702  WBC 6.8  HGB 13.2  HCT 38.9  PLT 190  MCV 83.8  MCH 28.4  MCHC 33.9  RDW 13.0   ------------------------------------------------------------------------------------------------------------------  Chemistries   Recent Labs Lab 05/15/13 1702  NA 141  K 3.9  CL  102  CO2 22  GLUCOSE 85  BUN 16  CREATININE 0.67  CALCIUM 9.2   ------------------------------------------------------------------------------------------------------------------ estimated creatinine clearance is 84.2 ml/min (by C-G formula based on Cr of 0.67). ------------------------------------------------------------------------------------------------------------------ No results found for this basename: TSH, T4TOTAL, FREET3, T3FREE, THYROIDAB,  in the last 72 hours   C ---------------------------------------------------------------------------------------------------------------  Urinalysis    Component Value Date/Time   COLORURINE LT. YELLOW 12/28/2010 [place] 12/28/2010 0928   LABSPEC 1.020 12/28/2010 0928   PHURINE 5.5 12/28/2010 0928   HGBUR TRACE-LYSED 12/28/2010 0928   BILIRUBINUR NEGATIVE 12/28/2010 0928   KETONESUR NEGATIVE 12/28/2010 0928   UROBILINOGEN 0.2 12/28/2010 0928   NITRITE NEGATIVE 12/28/2010 0928   LEUKOCYTESUR NEGATIVE 12/28/2010 0928    ----------------------------------------------------------------------------------------------------------------  ABG     Imaging results:   Dg Chest 2 View  05/15/2013   CLINICAL DATA Animal bite.  Chest pain.  EXAM CHEST  2 VIEW  COMPARISON DG CHEST 2 VIEW dated 01/10/2008  FINDINGS Cardiopericardial silhouette within normal limits. Mediastinal contours normal. Trachea midline. No airspace disease or effusion. Basilar atelectasis. Left mid lung atelectasis or scarring. No displaced rib fractures or pneumothorax. Surgical clips in the thoracic  inlet. ACDF.  IMPRESSION Basilar atelectasis.  No acute cardiopulmonary disease.  SIGNATURE  Electronically Signed   By: Dereck Ligas M.D.   On: 05/15/2013 19:04   Dg Hand Complete Left  05/15/2013   CLINICAL DATA Dog bite.  EXAM LEFT HAND - COMPLETE 3+ VIEW  COMPARISON None.  FINDINGS Anatomic alignment. No fracture. Basal joint of the thumb osteoarthritis. No radiopaque foreign body. Skin irregularity is present over the dorsum of the hand, which may be posttraumatic.  IMPRESSION No acute osseous abnormality.  SIGNATURE  Electronically Signed   By: Dereck Ligas M.D.   On: 05/15/2013 19:05   Dg Hand Complete Right  05/15/2013   CLINICAL DATA Dog bite.  EXAM RIGHT HAND - COMPLETE 3+ VIEW  COMPARISON None.  FINDINGS Ulnar deviation of the small finger is probably positional. There is no fracture identified. Soft tissue swelling of the ulnar side of the hand is present with edema in the soft tissues. No fracture or radiopaque foreign body identified.  IMPRESSION No acute osseous abnormality.  SIGNATURE  Electronically Signed   By: Dereck Ligas M.D.   On: 05/15/2013 19:06    My personal review of EKG: Normal sinus rhythm with T-wave inversions in the inferior leads Assessment & Plan  1. Elevated troponin: unstable angina versus pericarditis    Cardiology is following    Check echo    Hold heparin drip per cardiology 2. Pericarditis     Check echocardiogram    Hold Motrin     Restart colchicine 3.Dog bite    The dog was checked and has had all his treatments    Start Unasyn IV due to the location of the wounds     Local cleaning with hydrogen peroxide and water   DVT Prophylaxis heparin  AM Labs Ordered, also please review Full Orders  Code Status full  Disposition Plan: Home  Time spent in minutes : 35 minutes  Condition GUARDED   @SIGNATURE @

## 2013-05-15 NOTE — ED Notes (Signed)
Pt ambulated to restroom independently.

## 2013-05-15 NOTE — ED Notes (Signed)
[place].

## 2013-05-15 NOTE — ED Notes (Signed)
Pt states dog owner says dog is up to date with shots.

## 2013-05-15 NOTE — ED Notes (Signed)
Pt in c/o multiple dog bites, pt then developed chest pain, pt was walking her dog and went to pick hers up to prevent a fight and the other dog bit her, pt states she has been having chest pain recently, is currently being treated for pericarditis which is causing the pain and then the pain increased after the dog attack, no distress noted at this time. IV started PTA.

## 2013-05-16 ENCOUNTER — Encounter (HOSPITAL_COMMUNITY): Payer: Self-pay | Admitting: *Deleted

## 2013-05-16 DIAGNOSIS — I214 Non-ST elevation (NSTEMI) myocardial infarction: Secondary | ICD-10-CM

## 2013-05-16 DIAGNOSIS — I369 Nonrheumatic tricuspid valve disorder, unspecified: Secondary | ICD-10-CM

## 2013-05-16 DIAGNOSIS — I2510 Atherosclerotic heart disease of native coronary artery without angina pectoris: Secondary | ICD-10-CM

## 2013-05-16 DIAGNOSIS — Z8679 Personal history of other diseases of the circulatory system: Secondary | ICD-10-CM

## 2013-05-16 DIAGNOSIS — W540XXA Bitten by dog, initial encounter: Secondary | ICD-10-CM

## 2013-05-16 DIAGNOSIS — R079 Chest pain, unspecified: Secondary | ICD-10-CM

## 2013-05-16 DIAGNOSIS — T148XXA Other injury of unspecified body region, initial encounter: Secondary | ICD-10-CM

## 2013-05-16 DIAGNOSIS — T07XXXA Unspecified multiple injuries, initial encounter: Secondary | ICD-10-CM | POA: Diagnosis present

## 2013-05-16 LAB — CBC
HCT: 34.9 % — ABNORMAL LOW (ref 36.0–46.0)
Hemoglobin: 12 g/dL (ref 12.0–15.0)
MCH: 28.9 pg (ref 26.0–34.0)
MCHC: 34.4 g/dL (ref 30.0–36.0)
MCV: 84.1 fL (ref 78.0–100.0)
PLATELETS: 146 10*3/uL — AB (ref 150–400)
RBC: 4.15 MIL/uL (ref 3.87–5.11)
RDW: 13.3 % (ref 11.5–15.5)
WBC: 8.1 10*3/uL (ref 4.0–10.5)

## 2013-05-16 LAB — BASIC METABOLIC PANEL
BUN: 12 mg/dL (ref 6–23)
CALCIUM: 8.5 mg/dL (ref 8.4–10.5)
CO2: 23 mEq/L (ref 19–32)
Chloride: 103 mEq/L (ref 96–112)
Creatinine, Ser: 0.65 mg/dL (ref 0.50–1.10)
GFR calc Af Amer: >90 mL/min (ref >90)
Glucose, Bld: 99 mg/dL (ref 70–99)
POTASSIUM: 3.2 meq/L — AB (ref 3.7–5.3)
Sodium: 141 mEq/L (ref 137–147)

## 2013-05-16 LAB — MRSA PCR SCREENING: MRSA by PCR: NEGATIVE

## 2013-05-16 LAB — TROPONIN I
TROPONIN I: 1.94 ng/mL — AB (ref <0.30)
Troponin I: 0.93 ng/mL (ref <0.30)

## 2013-05-16 MED ORDER — HYDROCHLOROTHIAZIDE 12.5 MG PO CAPS
12.5000 mg | ORAL_CAPSULE | Freq: Every day | ORAL | Status: DC
  Administered 2013-05-16 – 2013-05-19 (×3): 12.5 mg via ORAL
  Filled 2013-05-16 (×5): qty 1

## 2013-05-16 MED ORDER — PRAVASTATIN SODIUM 40 MG PO TABS
40.0000 mg | ORAL_TABLET | Freq: Every day | ORAL | Status: DC
  Administered 2013-05-16 – 2013-05-19 (×3): 40 mg via ORAL
  Filled 2013-05-16 (×5): qty 1

## 2013-05-16 MED ORDER — POTASSIUM CHLORIDE CRYS ER 20 MEQ PO TBCR
40.0000 meq | EXTENDED_RELEASE_TABLET | ORAL | Status: AC
  Administered 2013-05-16 (×2): 40 meq via ORAL
  Filled 2013-05-16 (×2): qty 2

## 2013-05-16 MED ORDER — HYDROCODONE-ACETAMINOPHEN 5-325 MG PO TABS
1.0000 | ORAL_TABLET | ORAL | Status: DC | PRN
  Administered 2013-05-16 – 2013-05-18 (×7): 2 via ORAL
  Administered 2013-05-19: 1 via ORAL
  Administered 2013-05-19 – 2013-05-20 (×2): 2 via ORAL
  Filled 2013-05-16 (×2): qty 2
  Filled 2013-05-16: qty 1
  Filled 2013-05-16 (×8): qty 2

## 2013-05-16 NOTE — Care Management Note (Unsigned)
    Page 1 of 1   05/16/2013     2:10:53 PM   CARE MANAGEMENT NOTE 05/16/2013  Patient:  Amy Hodge, Amy Hodge   Account Number:  [id number]  Date Initiated:  05/16/2013  Documentation initiated by:  GRAVES-BIGELOW,Joniyah Mallinger  Subjective/Objective Assessment:   Pt admitted with a dog bite followed by 5 minutes history of chest pain retrocardiac, pressure-like, radiating to show the left shoulder. Increased troponins.     Action/Plan:   No needs from CM at this time. Will contiue to monitor.   Anticipated DC Date:  05/17/2013   Anticipated DC Plan:  [place]  CM consult      Choice offered to / List presented to:             Status of service:  In process, will continue to follow Medicare Important Message given?   (If response is "NO", the following Medicare IM given date fields will be blank) Date Medicare IM given:   Date Additional Medicare IM given:    Discharge Disposition:    Per UR Regulation:  Reviewed for med. necessity/level of care/duration of stay  If discussed at [place] of Stay Meetings, dates discussed:    Comments:

## 2013-05-16 NOTE — Progress Notes (Signed)
Admit date: 05/15/2013 Primary Physician  Cathlean Cower, MD Primary Cardiologist  Dr. Driscilla Grammes in [place]  CC: Chest pain  HPI: 58 year-old female with coronary artery disease status post percutaneous intervention at both [place], [place] and [place], 4 separate stents with reported long, complicated procedures lasting 3 and 4 hours here with chest pain and worry after bulldog bite on both hands.    Recently over the past 2 weeks, she was treated with colchicine and NSAIDs for pericarditis and was told that she had a small pericardial effusion. This occurred after the second cardiac catheterization at [facility] which resulted in myocardial infarction. She has had continuous chest pain since then. She was going to see her cardiologist this Friday. They were visiting her daughter here in [place].   She was admitted after developing SSCP that occurred after she was bitten on the hands trying to break up a dog fight.  Her troponin levels remained mildly elevated.  Her peak troponin levels 1.94. It has come down to 0.93. Echocardiogram has been ordered but the results are not back yet.    PMH:   Past Medical History  Diagnosis Date  . GERD (gastroesophageal reflux disease)   . Anxiety   . Depression   . Hypothyroidism   . Headache(784.0)   . Allergy   . Hyperlipidemia   . COPD (chronic obstructive pulmonary disease)   . DJD (degenerative joint disease) of knee     RT  . DDD (degenerative disc disease), cervical   . COPD exacerbation 12/31/2010    PSH:   Past Surgical History  Procedure Laterality Date  . Tubal ligation  1984  . Appendectomy  1971  . Thyroidectomy  1996  . Tonsillectomy  1965  . Abdominal hysterectomy    . Oophorectomy    . Total abdominal hysterectomy w/ bilateral salpingoophorectomy  01/2008    with cervical dysplasia  . Shoulder surgery      RT   Allergies:  Levofloxacin; Morphine and related; Tetracycline; and Bactrim Prior to Admit Meds:     Prior to Admission medications   Medication Sig Start Date End Date Taking? Authorizing Provider  acetaminophen (TYLENOL) 500 MG tablet Take 1,000 mg by mouth every 8 (eight) hours as needed for mild pain.    Yes Historical Provider, MD  albuterol (PROVENTIL HFA;VENTOLIN HFA) 108 (90 BASE) MCG/ACT inhaler Inhale 2 puffs into the lungs every 6 (six) hours as needed for wheezing or shortness of breath.   Yes Historical Provider, MD  aspirin EC 81 MG tablet Take 81 mg by mouth daily.   Yes Historical Provider, MD  beclomethasone (QVAR) 80 MCG/ACT inhaler Inhale 1 puff into the lungs daily as needed (shortness of breath).   Yes Historical Provider, MD  carvedilol (COREG) 3.125 MG tablet Take 3.125 mg by mouth 2 (two) times daily with a meal.   Yes Historical Provider, MD  Clopidogrel Bisulfate (PLAVIX PO) Take 60 mg by mouth daily.   Yes Historical Provider, MD  colchicine 0.6 MG tablet Take 0.6 mg by mouth 2 (two) times daily.   Yes Historical Provider, MD  DULoxetine (CYMBALTA) 60 MG capsule Take 1 capsule (60 mg total) by mouth daily. 08/11/11  Yes Biagio Borg, MD  hydrochlorothiazide (HYDRODIURIL) 25 MG tablet Take 12.5 mg by mouth daily.   Yes Historical Provider, MD  ibuprofen (ADVIL,MOTRIN) 800 MG tablet Take 800 mg by mouth 3 (three) times daily.   Yes Historical Provider, MD  levothyroxine (SYNTHROID)  175 MCG tablet Take 1 tablet (175 mcg total) by mouth daily. 12/31/10  Yes Biagio Borg, MD  omeprazole (PRILOSEC) 20 MG capsule Take 1 capsule (20 mg total) by mouth daily. 12/31/10  Yes Biagio Borg, MD  pravastatin (PRAVACHOL) 40 MG tablet Take 40 mg by mouth every evening.   Yes Historical Provider, MD  beclomethasone (QVAR) 80 MCG/ACT inhaler Inhale 1 puff into the lungs as needed. 12/31/10 12/31/11  Biagio Borg, MD   Fam HX:    Family History  Problem Relation Age of Onset  . Diabetes Father   . Hypertension Father   . Heart disease Father    Social HX:    History   Social  History  . Marital Status: Married    Spouse Name: N/A    Number of Children: 1  . Years of Education: N/A   Occupational History  .     Social History Main Topics  . Smoking status: Former Research scientist (life sciences)  . Smokeless tobacco: Not on file  . Alcohol Use: No  . Drug Use: No  . Sexual Activity:    Other Topics Concern  . Not on file   Social History Narrative   On worker's comp for lower back and right shoulder - applying for disability; losing [employer] insurance 03/07/2010     Physical Exam: Blood pressure 93/62, pulse 81, temperature 98.3 F (36.8 C), temperature source Oral, resp. rate 14, height 5' 6.5" (1.689 m), weight 180 lb 1.9 oz (81.7 kg), SpO2 97.00%.   General: Well developed, well nourished, in no acute distress Head: Eyes PERRLA, No xanthomas.   Normal cephalic and atramatic  Lungs:  Clear bilaterally to auscultation and percussion. Normal respiratory effort. No wheezes, no rales. Heart:  HRRR S1 S2 Pulses are 2+ & equal. No murmurs, rubs or gallops.             No carotid bruit. No JVD.  No abdominal bruits. Abdomen: Bowel sounds are positive, abdomen soft and non-tender without masses or                 Hernia's noted. No hepatosplenomegaly. Msk:  Back normal, normal gait. Normal strength and tone for age. Extremities:  No clubbing, cyanosis or edema.  DP +1, bilateral hands with multiple puncture wounds, bleeding. Neuro: Alert and oriented X 3, non-focal, MAE x 4 GU: Deferred Rectal: Deferred Psych:  Good affect, responds appropriately, tearful         Labs:   Lab Results  Component Value Date   WBC 8.1 05/16/2013   HGB 12.0 05/16/2013   HCT 34.9* 05/16/2013   MCV 84.1 05/16/2013   PLT 146* 05/16/2013     Recent Labs Lab 05/16/13 0053  NA 141  K 3.2*  CL 103  CO2 23  BUN 12  CREATININE 0.65  CALCIUM 8.5  GLUCOSE 99    Recent Labs  05/15/13 1923 05/16/13 0053 05/16/13 0650  CKTOTAL 132  --   --   CKMB 6.0*  --   --   TROPONINI 1.50* 1.94*  0.93*   Lab Results  Component Value Date   CHOL 248* 12/28/2010   HDL 50.00 12/28/2010   TRIG 153.0* 12/28/2010   Troponin 0.13, mildly elevated.   Radiology:  Dg Chest 2 View  05/15/2013   CLINICAL DATA Animal bite.  Chest pain.  EXAM CHEST  2 VIEW  COMPARISON DG CHEST 2 VIEW dated 01/10/2008  FINDINGS Cardiopericardial silhouette within normal limits. Mediastinal contours normal.  Trachea midline. No airspace disease or effusion. Basilar atelectasis. Left mid lung atelectasis or scarring. No displaced rib fractures or pneumothorax. Surgical clips in the thoracic inlet. ACDF.  IMPRESSION Basilar atelectasis.  No acute cardiopulmonary disease.  SIGNATURE  Electronically Signed   By: Dereck Ligas M.D.   On: 05/15/2013 19:04   Dg Hand Complete Left  05/15/2013   CLINICAL DATA Dog bite.  EXAM LEFT HAND - COMPLETE 3+ VIEW  COMPARISON None.  FINDINGS Anatomic alignment. No fracture. Basal joint of the thumb osteoarthritis. No radiopaque foreign body. Skin irregularity is present over the dorsum of the hand, which may be posttraumatic.  IMPRESSION No acute osseous abnormality.  SIGNATURE  Electronically Signed   By: Dereck Ligas M.D.   On: 05/15/2013 19:05   Dg Hand Complete Right  05/15/2013   CLINICAL DATA Dog bite.  EXAM RIGHT HAND - COMPLETE 3+ VIEW  COMPARISON None.  FINDINGS Ulnar deviation of the small finger is probably positional. There is no fracture identified. Soft tissue swelling of the ulnar side of the hand is present with edema in the soft tissues. No fracture or radiopaque foreign body identified.  IMPRESSION No acute osseous abnormality.  SIGNATURE  Electronically Signed   By: Dereck Ligas M.D.   On: 05/15/2013 19:06   Personally viewed.   EKG:  05/15/13-sinus rhythm, T-wave inversion in inferior leads, subtle in lead 3. Change since EKG on 09/05/2007. Personally viewed.  ASSESSMENT/PLAN:   58 year old female with coronary artery disease status post 4 prior stents placed in  [place] and [place] with COPD, hypertension, hyperlipidemia, recent diagnosis of pericarditis on colchicine and NSAIDs here with chest discomfort while visiting [place] now with mildly elevated point-of-care troponin of 0.13.  1. coronary artery disease: The patient has complex coronary artery disease. She's had several complex cardiac catheterizations and from her husband's description she has residual coronary arteries that are either occluded or are in jeopardy because of coronary stents. She had 3 stents placed in February and since that procedure she's had chronic angina. She's been unable to walk much because of his angina. Her course has been also complicated by pericarditis.  I suspect that she has a subtotally occluded artery-and may have several severely diseased vessels.  One possibility is that she had an acute adrenaline rush she when she was trying to break up a dog fight. This caused her blood pressure and heart rate to go up which resulted in a supply/demand mismatch leading to a subendocardial myocardial infarction. Her troponin levels have peaked and is now trending back down. She remains fairly comfortable.  I cannot completely rule out the possibility of a plaque rupture but her symptoms have stabilized and she did not require nitroglycerin or heparin.  She's feeling back to baseline the most part appear  Her exertional capacity has been quite limited since February because of this chronic angina syndrome.   She lives out-of-town and would definitely like to get back with her medical doctor and her cardiologist.  At this point I do not necessarily think that she needs a repeat cardiac catheterization. In fact, it may be difficult for [name] to tell what is acute and what his chronic in that  that she has multiple areas that had been stented/restented and also has subtotal occlusions that are chronic.  She's able to ambulate in the hall without significant discomfort- or at least  to the point where she was prior to the dog attack,  I would feel comfortable in  sending her home.  She needs to see her cardiologist fairly soon. If she continues to have episodes of chest pain like she describes, I suggested that she consider coronary artery bypass grafting. She is quite limited in her capacity. I have not seen her angiograms and I do not know if her coronaries are amenable to bypass grafting.  2. Dog bites-per primary team.  3. COPD-stable. She hasn't smoked in 15 years.  4. Hyperlipidemia-continue with statin.  5. Hypertension-stable. Continue current medications.  Darden Amber., MD  05/16/2013  12:51 PM

## 2013-05-16 NOTE — Progress Notes (Signed)
Pt admitted March 11th, 2015 with chest pain and dog bite wounds requiring ABX. Has extensive cardiac history , recent MI and s/p stenting, persistent chest pain post cardiac cath in February 2015. Hand surgery team consulted for further evaluation, and recommendation is to take pt to OR for deeper wound washing and cleaning under general anesthesia. Discussed with cardiologist on call risk of taking pt to OR and placing under general anesthesia given cardiac risk factors. Pt falls into category of moderate to high risk but unfortunately no further treatment options available to lower the risk, per cardiology team. If benefits of the procedure outweigh the risks, would proceed with surgery.   Faye Ramsay, MD  [facility] Pager [phone] Cell [phone]  If 7PM-7AM, please contact night-coverage [email or web address] Password [email or web address]

## 2013-05-16 NOTE — Progress Notes (Signed)
  Echocardiogram 2D Echocardiogram has been performed.  Amy Hodge FRANCES 05/16/2013, 12:02 PM

## 2013-05-16 NOTE — Progress Notes (Signed)
[facility]  Amy Hodge OZH:[id number] DOB: 1956-03-05 DOA: 05/15/2013 PCP: Cathlean Cower, MD  Brief narrative: 58 year old female patient with known history of newly diagnosed coronary artery disease. She is post MI sustained in February 2015. During that hospitalization she underwent stent placement and stent placement developed actual myocardial infarction. That hospitalization was also complicated by pericarditis and patient had been discharged on Motrin causes and colchicine.  She presents to the emergency department with 2 complaints: Chest pain and bilateral hand injuries related to dog bite. Patient apparently had been attacked by a neighbors pulled off on attempting to keep that don't from biting her dog. She was bitten on both hands quite significantly. After the attack she began having significant chest discomfort similar to her previous MI symptoms. Because she initially presented with cardiac symptoms cardiology team evaluated the patient in the ER and opted not to start heparin drip since she had no ischemic changes on her EKG and her troponins were only mildly elevated and this could be explained by her recent pericarditis. X-rays were performed of her hands which showed no bony injuries and only soft tissue injuries. She was empirically started on Unasyn and admitted  Assessment/Plan: Active Problems:   Chest pain/History of pericarditis -Per cardiology -Echocardiogram pending -TNI peak at 1.50 with current trend down -No current chest pain -Continue aspirin, Plavix, carvedilol and hydrochlorothiazide for known CAD -Continue colchicine for suspected pericarditis    Bite from dog/Soft tissue injury of multiple sites/bilateral hands -Endorses significant discomfort primarily in left hand/left index finger -Continue empiric Unasyn -Consult orthopedic hand specialist due to concerns may have underlying soft tissue/ligamentous injuries not  able to be seen on plain x-ray    HYPOTHYROIDISM -Continue Synthroid    HYPERLIPIDEMIA -Continue Zocor    COPD -Compensated without evidence of wheezing -Continue fluticasone   DVT prophylaxis: subCutaneous heparin Code Status: Full Family Communication: Daughter at bedside Disposition Plan/Expected LOS: Remain in step down until after results returned   Consultants: Cardiology Orthopedic hand specialist  Procedures: 2-D echocardiogram pending  Antibiotics: Unasyn 3/11 >>>  HPI/Subjective: Patient alert and primarily complaining of significant left hand and index finger pain. No chest pain or shortness of breath. States recently moved to [place] from [facility] and has not established with a primary care physician yet in [place].  Objective: Blood pressure 93/62, pulse 81, temperature 98.3 F (36.8 C), temperature source Oral, resp. rate 14, height 5' 6.5" (1.689 m), weight 180 lb 1.9 oz (81.7 kg), SpO2 97.00%.  Intake/Output Summary (Last 24 hours) at 05/16/13 1414 Last data filed at 05/16/13 1041  Gross per 24 hour  Intake    393 ml  Output      0 ml  Net    393 ml     Exam: General: No acute respiratory distress Lungs: Clear to auscultation bilaterally without wheezes or crackles, RA Cardiovascular: Regular rate and rhythm without murmur gallop or rub normal S1 and S2, no peripheral edema or JVD Abdomen: Nontender, nondistended, soft, bowel sounds positive, no rebound, no ascites, no appreciable mass Musculoskeletal: No significant cyanosis, clubbing of bilateral lower extremities-noted multiple puncture wounds on both hands with left hand being more involved with more edema and discoloration especially to left index finger; no active bleeding or purulence noted Neurological: Alert and oriented x 3, moves all extremities x 4 without focal neurological deficits, CN 2-12 intact  Scheduled Meds:  Scheduled Meds: . ampicillin-sulbactam (UNASYN) IV  3 g  Intravenous Q6H  . aspirin EC  81 mg Oral Daily  . carvedilol  3.125 mg Oral BID WC  . clopidogrel  75 mg Oral Q breakfast  . colchicine  0.6 mg Oral BID  . DULoxetine  60 mg Oral Daily  . fluticasone  1 puff Inhalation BID  . heparin  5,000 Units Subcutaneous 3 times per day  . hydrochlorothiazide  12.5 mg Oral Daily  . levothyroxine  175 mcg Oral QAC breakfast  . pantoprazole  40 mg Oral Daily  . potassium chloride  40 mEq Oral Q4H  . simvastatin  10 mg Oral q1800  . sodium chloride  3 mL Intravenous Q12H   Continuous Infusions:   Data Reviewed: Basic Metabolic Panel:  Recent Labs Lab 05/15/13 1702 05/16/13 0053  NA 141 141  K 3.9 3.2*  CL 102 103  CO2 22 23  GLUCOSE 85 99  BUN 16 12  CREATININE 0.67 0.65  CALCIUM 9.2 8.5   Liver Function Tests: No results found for this basename: AST, ALT, ALKPHOS, BILITOT, PROT, ALBUMIN,  in the last 168 hours No results found for this basename: LIPASE, AMYLASE,  in the last 168 hours No results found for this basename: AMMONIA,  in the last 168 hours CBC:  Recent Labs Lab 05/15/13 1702 05/16/13 0053  WBC 6.8 8.1  HGB 13.2 12.0  HCT 38.9 34.9*  MCV 83.8 84.1  PLT 190 146*   Cardiac Enzymes:  Recent Labs Lab 05/15/13 1923 05/16/13 0053 05/16/13 0650  CKTOTAL 132  --   --   CKMB 6.0*  --   --   TROPONINI 1.50* 1.94* 0.93*   BNP (last 3 results) No results found for this basename: PROBNP,  in the last 8760 hours CBG: No results found for this basename: GLUCAP,  in the last 168 hours  Recent Results (from the past 240 hour(s))  MRSA PCR SCREENING     Status: None   Collection Time    05/16/13  2:09 AM      Result Value Ref Range Status   MRSA by PCR NEGATIVE  NEGATIVE Final   Comment:            The GeneXpert MRSA Assay (FDA     approved for NASAL specimens     only), is one component of a     comprehensive MRSA colonization     surveillance program. It is not     intended to diagnose MRSA     infection  nor to guide or     monitor treatment for     MRSA infections.     Studies:  Recent x-ray studies have been reviewed in detail by the Attending Physician  Time spent :     Erin Hearing, [place] [facility]  [phone] Pager [phone]  **If unable to reach the above provider after paging please contact the [place] @ [phone]  On-Call/Text Page:      [email or web address]      password [email or web address]  If 7PM-7AM, please contact night-coverage [email or web address] Password [facility] 05/16/2013, 2:14 PM   LOS: 1 day   I have examined the patient, reviewed the chart and modified the above note which I agree with.   Amy Deyo,MD Pager # [email or web address] 05/16/2013, 2:58 PM

## 2013-05-16 NOTE — Consult Note (Signed)
Reason for Consult: multiple dog bites right and left hands and forearms Referring Physician: [name] is an 59 y.o. female.  HPI: Patient sustained a dog bite to the right and left hands while trying to break up a fight between her dog and another dog. She presented last night to the emergency room. She was placed on antibiotics and placed on observatory care.  I was asked to see her in regards to the dog bite injuries as she has lost the ability to move some fingers and has early infectious signs. She has no evidence of compartment syndrome.  She has a lot of pain in the hands.  She is here with her family.  [place]  She has an extensive cardiac history which we discussed with her family.    Past Medical History  Diagnosis Date  . GERD (gastroesophageal reflux disease)   . Anxiety   . Depression   . Hypothyroidism   . Headache(784.0)   . Allergy   . Hyperlipidemia   . COPD (chronic obstructive pulmonary disease)   . DJD (degenerative joint disease) of knee     RT  . DDD (degenerative disc disease), cervical   . COPD exacerbation 12/31/2010    Past Surgical History  Procedure Laterality Date  . Tubal ligation  1984  . Appendectomy  1971  . Thyroidectomy  1996  . Tonsillectomy  1965  . Abdominal hysterectomy    . Oophorectomy    . Total abdominal hysterectomy w/ bilateral salpingoophorectomy  01/2008    with cervical dysplasia  . Shoulder surgery      RT    Family History  Problem Relation Age of Onset  . Diabetes Father   . Hypertension Father   . Heart disease Father     Social History:  reports that she has quit smoking. She does not have any smokeless tobacco history on file. She reports that she does not drink alcohol or use illicit drugs.  Allergies:  Allergies  Allergen Reactions  . Levofloxacin     REACTION: hives  . Morphine And Related Other (See Comments)    headache  . Simvastatin     Makes my legs hurt/ sore    . Tetracycline     REACTION: hives  . Bactrim [Sulfamethoxazole-Trimethoprim] Anxiety and Other (See Comments)    Racing heart    Medications: I have reviewed the patient's current medications.  Results for orders placed during the hospital encounter of 05/15/13 (from the past 48 hour(s))  CBC     Status: None   Collection Time    05/15/13  5:02 PM      Result Value Ref Range   WBC 6.8  4.0 - 10.5 K/uL   RBC 4.64  3.87 - 5.11 MIL/uL   Hemoglobin 13.2  12.0 - 15.0 g/dL   HCT 38.9  36.0 - 46.0 %   MCV 83.8  78.0 - 100.0 fL   MCH 28.4  26.0 - 34.0 pg   MCHC 33.9  30.0 - 36.0 g/dL   RDW 13.0  11.5 - 15.5 %   Platelets 190  150 - 400 K/uL  BASIC METABOLIC PANEL     Status: None   Collection Time    05/15/13  5:02 PM      Result Value Ref Range   Sodium 141  137 - 147 mEq/L   Potassium 3.9  3.7 - 5.3 mEq/L   Chloride 102  96 - 112  mEq/L   CO2 22  19 - 32 mEq/L   Glucose, Bld 85  70 - 99 mg/dL   BUN 16  6 - 23 mg/dL   Creatinine, Ser 0.67  0.50 - 1.10 mg/dL   Calcium 9.2  8.4 - 10.5 mg/dL   GFR calc non Af Amer >90  >90 mL/min   GFR calc Af Amer >90  >90 mL/min   Comment: (NOTE)     The eGFR has been calculated using the CKD EPI equation.     This calculation has not been validated in all clinical situations.     eGFR's persistently <90 mL/min signify possible Chronic Kidney     Disease.  Randolm Idol, ED     Status: Abnormal   Collection Time    05/15/13  5:11 PM      Result Value Ref Range   Troponin i, poc 0.13 (*) 0.00 - 0.08 ng/mL   Comment NOTIFIED PHYSICIAN     Comment 3            Comment: Due to the release kinetics of cTnI,     a negative result within the first hours     of the onset of symptoms does not rule out     myocardial infarction with certainty.     If myocardial infarction is still suspected,     repeat the test at appropriate intervals.  TROPONIN I     Status: Abnormal   Collection Time    05/15/13  7:23 PM      Result Value Ref Range    Troponin I 1.50 (*) <0.30 ng/mL   Comment:            Due to the release kinetics of cTnI,     a negative result within the first hours     of the onset of symptoms does not rule out     myocardial infarction with certainty.     If myocardial infarction is still suspected,     repeat the test at appropriate intervals.     CRITICAL RESULT CALLED TO, READ BACK BY AND VERIFIED WITH:     C GOSS,RN 2010 05/15/13 D BRADLEY  CK TOTAL AND CKMB     Status: Abnormal   Collection Time    05/15/13  7:23 PM      Result Value Ref Range   Total CK 132  7 - 177 U/L   CK, MB 6.0 (*) 0.3 - 4.0 ng/mL   Relative Index 4.5 (*) 0.0 - 2.5  TROPONIN I     Status: Abnormal   Collection Time    05/16/13 12:53 AM      Result Value Ref Range   Troponin I 1.94 (*) <0.30 ng/mL   Comment:            Due to the release kinetics of cTnI,     a negative result within the first hours     of the onset of symptoms does not rule out     myocardial infarction with certainty.     If myocardial infarction is still suspected,     repeat the test at appropriate intervals.     CRITICAL VALUE NOTED.  VALUE IS CONSISTENT WITH PREVIOUSLY REPORTED AND CALLED VALUE.  CBC     Status: Abnormal   Collection Time    05/16/13 12:53 AM      Result Value Ref Range   WBC 8.1  4.0 - 10.5 K/uL  RBC 4.15  3.87 - 5.11 MIL/uL   Hemoglobin 12.0  12.0 - 15.0 g/dL   HCT 34.9 (*) 36.0 - 46.0 %   MCV 84.1  78.0 - 100.0 fL   MCH 28.9  26.0 - 34.0 pg   MCHC 34.4  30.0 - 36.0 g/dL   RDW 13.3  11.5 - 15.5 %   Platelets 146 (*) 150 - 400 K/uL   Comment: DELTA CHECK NOTED     REPEATED TO VERIFY     SPECIMEN CHECKED FOR CLOTS  BASIC METABOLIC PANEL     Status: Abnormal   Collection Time    05/16/13 12:53 AM      Result Value Ref Range   Sodium 141  137 - 147 mEq/L   Potassium 3.2 (*) 3.7 - 5.3 mEq/L   Comment: DELTA CHECK NOTED   Chloride 103  96 - 112 mEq/L   CO2 23  19 - 32 mEq/L   Glucose, Bld 99  70 - 99 mg/dL   BUN 12  6 - 23  mg/dL   Creatinine, Ser 0.65  0.50 - 1.10 mg/dL   Calcium 8.5  8.4 - 10.5 mg/dL   GFR calc non Af Amer >90  >90 mL/min   GFR calc Af Amer >90  >90 mL/min   Comment: (NOTE)     The eGFR has been calculated using the CKD EPI equation.     This calculation has not been validated in all clinical situations.     eGFR's persistently <90 mL/min signify possible Chronic Kidney     Disease.  MRSA PCR SCREENING     Status: None   Collection Time    05/16/13  2:09 AM      Result Value Ref Range   MRSA by PCR NEGATIVE  NEGATIVE   Comment:            The GeneXpert MRSA Assay (FDA     approved for NASAL specimens     only), is one component of a     comprehensive MRSA colonization     surveillance program. It is not     intended to diagnose MRSA     infection nor to guide or     monitor treatment for     MRSA infections.  TROPONIN I     Status: Abnormal   Collection Time    05/16/13  6:50 AM      Result Value Ref Range   Troponin I 0.93 (*) <0.30 ng/mL   Comment:            Due to the release kinetics of cTnI,     a negative result within the first hours     of the onset of symptoms does not rule out     myocardial infarction with certainty.     If myocardial infarction is still suspected,     repeat the test at appropriate intervals.     CRITICAL VALUE NOTED.  VALUE IS CONSISTENT WITH PREVIOUSLY REPORTED AND CALLED VALUE.    Dg Chest 2 View  05/15/2013   CLINICAL DATA Animal bite.  Chest pain.  EXAM CHEST  2 VIEW  COMPARISON DG CHEST 2 VIEW dated 01/10/2008  FINDINGS Cardiopericardial silhouette within normal limits. Mediastinal contours normal. Trachea midline. No airspace disease or effusion. Basilar atelectasis. Left mid lung atelectasis or scarring. No displaced rib fractures or pneumothorax. Surgical clips in the thoracic inlet. ACDF.  IMPRESSION Basilar atelectasis.  No acute cardiopulmonary disease.  SIGNATURE  Electronically Signed   By: Dereck Ligas M.D.   On: 05/15/2013 19:04    Dg Hand Complete Left  05/15/2013   CLINICAL DATA Dog bite.  EXAM LEFT HAND - COMPLETE 3+ VIEW  COMPARISON None.  FINDINGS Anatomic alignment. No fracture. Basal joint of the thumb osteoarthritis. No radiopaque foreign body. Skin irregularity is present over the dorsum of the hand, which may be posttraumatic.  IMPRESSION No acute osseous abnormality.  SIGNATURE  Electronically Signed   By: Dereck Ligas M.D.   On: 05/15/2013 19:05   Dg Hand Complete Right  05/15/2013   CLINICAL DATA Dog bite.  EXAM RIGHT HAND - COMPLETE 3+ VIEW  COMPARISON None.  FINDINGS Ulnar deviation of the small finger is probably positional. There is no fracture identified. Soft tissue swelling of the ulnar side of the hand is present with edema in the soft tissues. No fracture or radiopaque foreign body identified.  IMPRESSION No acute osseous abnormality.  SIGNATURE  Electronically Signed   By: Dereck Ligas M.D.   On: 05/15/2013 19:06    Review of Systems  Constitutional: Negative.   HENT: Negative.   Eyes: Negative.   Respiratory: Negative.   Gastrointestinal: Negative.    Blood pressure 110/82, pulse 94, temperature 97.9 F (36.6 C), temperature source Axillary, resp. rate 17, height 5' 6.5" (1.689 m), weight 81.7 kg (180 lb 1.9 oz), SpO2 93.00%. Physical Exam Patient has multiple dog bites about the left hand and right hand. She has right small finger flexion inability. It is difficult for her to flex the right small finger. She has a volar laceration over the small finger. She has erythema and bruising throughout this region. She also demonstrates volar lacerations and bruising.  She does not have a compartment syndrome. She has IV access in the hand.  She has loss of full flexion in the fingers about the right hand but can flex the index middle and ring finger it does not have an obvious flexor tendon injury. I cannot rule out a small finger flexor injury in the right side.  Her left hand has ecchymosis  and bruising throughout the hand  with early infectious signs noted in the index and ring finger. She has difficulty with movement of the fingers in question. She also has lacerations about the forearm and wrist.  I reviewed this at length. Abdomen is nontender nondistended and soft.  Chest is clear to auscultation.  HEENT stay normal limits. Lower extremity examination is benign  I discussed her these issues at length. Assessment/Plan: Given all issues I would recommend a thorough irrigation and debridement as she demonstrates early infectious signs. I would also consider evaluation of the tendons to see if she has a true tendon laceration. I discussed her these issues and she desires to proceed.  We'll proceed at a mutually convenient time once cleared. I discussed this with her and the hospitalist and discussed this with anesthesia.  Certainly she represents a moderate to high risk that he would like the input of internal medicine and cardiology.  I spoke to the admitting service and they recommended moving forward but understanding the high risk issue. Day of consult the cardiology.  Given her n.p.o. status anesthesia would like to wait until the stomach is empty and I would concur  Patient understands this.  I went ahead and performed a brief I&D of skin simultaneous tissue about the right hand less than 20 cm and a brief I&D of the left hand less than  20 cm given the time that we will wait until we get her for formal I&D she tolerated this at bedside well.      Paulene Floor 05/16/2013, 10:23 PM

## 2013-05-16 NOTE — Progress Notes (Signed)
Utilization review completed. Cuauhtemoc Huegel, RN, BSN. 

## 2013-05-16 NOTE — Consult Note (Signed)
WOC wound consult note Reason for Consult: Consult requested to assess bilat hand wounds. Orders have been previously provided for bedside nurse to cleanse with diluted peroxide solution. Pt had multiple dog bites and deep puncture wounds.  There are no wounds which require a dressing, but several fingers are very swollen, dark purple, and cannot straighten. Dried old blood to hands but no odor or current drainage. X-rays to bilat hands do not indicate any fractures. RECOMMEND HAND SURGEON CONSULT to determine if there is joint or tendon involvement and provide further plan of care. Please re-consult if further assistance is needed.  Thank-you,  Julien Girt MSN, [place], [place], [place], [place]

## 2013-05-17 ENCOUNTER — Encounter (HOSPITAL_COMMUNITY)
Admission: EM | Disposition: A | Discharge status: Home / Self Care | Payer: Self-pay | Source: Home / Self Care | Attending: Internal Medicine

## 2013-05-17 ENCOUNTER — Encounter (HOSPITAL_COMMUNITY): Payer: Medicare PPO | Admitting: Anesthesiology

## 2013-05-17 ENCOUNTER — Encounter (HOSPITAL_COMMUNITY): Payer: Self-pay | Admitting: Anesthesiology

## 2013-05-17 ENCOUNTER — Inpatient Hospital Stay (HOSPITAL_COMMUNITY): Payer: Medicare PPO | Admitting: Anesthesiology

## 2013-05-17 HISTORY — PX: I & D EXTREMITY: SHX5045

## 2013-05-17 SURGERY — IRRIGATION AND DEBRIDEMENT EXTREMITY
Anesthesia: General | Site: Arm Lower | Laterality: Bilateral

## 2013-05-17 MED ORDER — MIDAZOLAM HCL 5 MG/5ML IJ SOLN: INTRAMUSCULAR | Status: DC | PRN

## 2013-05-17 MED ORDER — ONDANSETRON HCL 4 MG/2ML IJ SOLN
INTRAMUSCULAR | Status: AC
  Filled 2013-05-17: qty 2

## 2013-05-17 MED ORDER — MIDAZOLAM HCL 2 MG/2ML IJ SOLN
INTRAMUSCULAR | Status: AC
  Filled 2013-05-17: qty 2

## 2013-05-17 MED ORDER — PROPOFOL 10 MG/ML IV BOLUS
INTRAVENOUS | Status: AC
  Filled 2013-05-17: qty 20

## 2013-05-17 MED ORDER — CLONAZEPAM 1 MG PO TABS
1.0000 mg | ORAL_TABLET | Freq: Every day | ORAL | Status: DC
  Administered 2013-05-17 – 2013-05-19 (×3): 1 mg via ORAL
  Filled 2013-05-17 (×4): qty 1

## 2013-05-17 MED ORDER — SUCCINYLCHOLINE CHLORIDE 20 MG/ML IJ SOLN
INTRAMUSCULAR | Status: AC
  Filled 2013-05-17: qty 1

## 2013-05-17 MED ORDER — LACTATED RINGERS IV SOLN
INTRAVENOUS | Status: DC
  Administered 2013-05-17: 15:00:00 via INTRAVENOUS

## 2013-05-17 MED ORDER — SUCCINYLCHOLINE CHLORIDE 20 MG/ML IJ SOLN
INTRAMUSCULAR | Status: AC
Start: 2013-05-17 — End: 2013-05-17
  Filled 2013-05-17: qty 1

## 2013-05-17 MED ORDER — ARTIFICIAL TEARS OP OINT
TOPICAL_OINTMENT | OPHTHALMIC | Status: AC
  Filled 2013-05-17: qty 3.5

## 2013-05-17 MED ORDER — FENTANYL CITRATE 0.05 MG/ML IJ SOLN
INTRAMUSCULAR | Status: DC | PRN
  Administered 2013-05-17: 50 ug via INTRAVENOUS
  Administered 2013-05-17: 100 ug via INTRAVENOUS
  Administered 2013-05-17: 25 ug via INTRAVENOUS
  Administered 2013-05-17: 50 ug via INTRAVENOUS
  Administered 2013-05-17: 25 ug via INTRAVENOUS

## 2013-05-17 MED ORDER — PHENYLEPHRINE 40 MCG/ML (10ML) SYRINGE FOR IV PUSH (FOR BLOOD PRESSURE SUPPORT)
PREFILLED_SYRINGE | INTRAVENOUS | Status: AC
  Filled 2013-05-17: qty 10

## 2013-05-17 MED ORDER — EPHEDRINE SULFATE 50 MG/ML IJ SOLN
INTRAMUSCULAR | Status: AC
  Filled 2013-05-17: qty 1

## 2013-05-17 MED ORDER — HYDROMORPHONE HCL PF 1 MG/ML IJ SOLN
INTRAMUSCULAR | Status: AC
  Administered 2013-05-17: 0.5 mg via INTRAVENOUS
  Filled 2013-05-17: qty 1

## 2013-05-17 MED ORDER — ROCURONIUM BROMIDE 50 MG/5ML IV SOLN
INTRAVENOUS | Status: AC
  Filled 2013-05-17: qty 1

## 2013-05-17 MED ORDER — PROPOFOL 10 MG/ML IV BOLUS
INTRAVENOUS | Status: DC | PRN
  Administered 2013-05-17: 160 mg via INTRAVENOUS

## 2013-05-17 MED ORDER — FENTANYL CITRATE 0.05 MG/ML IJ SOLN
INTRAMUSCULAR | Status: AC
  Filled 2013-05-17: qty 5

## 2013-05-17 MED ORDER — ONDANSETRON HCL 4 MG/2ML IJ SOLN: 4.0000 mg | Freq: Once | INTRAMUSCULAR | Status: DC | PRN

## 2013-05-17 MED ORDER — LIDOCAINE HCL (CARDIAC) 20 MG/ML IV SOLN
INTRAVENOUS | Status: DC | PRN
  Administered 2013-05-17: 70 mg via INTRAVENOUS

## 2013-05-17 MED ORDER — GLYCOPYRROLATE 0.2 MG/ML IJ SOLN
INTRAMUSCULAR | Status: AC
  Filled 2013-05-17: qty 2

## 2013-05-17 MED ORDER — LIDOCAINE HCL (CARDIAC) 20 MG/ML IV SOLN
INTRAVENOUS | Status: AC
  Filled 2013-05-17: qty 5

## 2013-05-17 MED ORDER — LACTATED RINGERS IV SOLN
INTRAVENOUS | Status: DC | PRN
  Administered 2013-05-17 (×2): via INTRAVENOUS

## 2013-05-17 MED ORDER — SODIUM CHLORIDE 0.9 % IR SOLN
Status: DC | PRN
  Administered 2013-05-17: 6000 mL

## 2013-05-17 MED ORDER — HYDROMORPHONE HCL PF 1 MG/ML IJ SOLN
0.5000 mg | Freq: Once | INTRAMUSCULAR | Status: AC
  Administered 2013-05-17: 0.5 mg via INTRAVENOUS

## 2013-05-17 MED ORDER — HYDROMORPHONE HCL PF 1 MG/ML IJ SOLN
0.2500 mg | INTRAMUSCULAR | Status: DC | PRN
Start: 2013-05-17 — End: 2013-05-17
  Administered 2013-05-17 (×4): 0.5 mg via INTRAVENOUS

## 2013-05-17 MED ORDER — ONDANSETRON HCL 4 MG/2ML IJ SOLN
INTRAMUSCULAR | Status: DC | PRN
  Administered 2013-05-17: 4 mg via INTRAVENOUS

## 2013-05-17 MED ORDER — NEOSTIGMINE METHYLSULFATE 1 MG/ML IJ SOLN
INTRAMUSCULAR | Status: AC
  Filled 2013-05-17: qty 10

## 2013-05-17 SURGICAL SUPPLY — 43 items
BANDAGE CONFORM 2  STR LF (GAUZE/BANDAGES/DRESSINGS) IMPLANT
BANDAGE ELASTIC 3 VELCRO ST LF (GAUZE/BANDAGES/DRESSINGS) ×1 IMPLANT
BANDAGE ELASTIC 4 VELCRO ST LF (GAUZE/BANDAGES/DRESSINGS) ×2 IMPLANT
BANDAGE GAUZE ELAST BULKY 4 IN (GAUZE/BANDAGES/DRESSINGS) ×4 IMPLANT
CORDS BIPOLAR (ELECTRODE) ×3 IMPLANT
CUFF TOURNIQUET SINGLE 18IN (TOURNIQUET CUFF) ×3 IMPLANT
CUFF TOURNIQUET SINGLE 24IN (TOURNIQUET CUFF) IMPLANT
DRSG ADAPTIC 3X8 NADH LF (GAUZE/BANDAGES/DRESSINGS) ×2 IMPLANT
ELECT REM PT RETURN 9FT ADLT (ELECTROSURGICAL)
ELECTRODE REM PT RTRN 9FT ADLT (ELECTROSURGICAL) IMPLANT
GAUZE XEROFORM 1X8 LF (GAUZE/BANDAGES/DRESSINGS) ×3 IMPLANT
GAUZE XEROFORM 5X9 LF (GAUZE/BANDAGES/DRESSINGS) ×1 IMPLANT
GLOVE BIOGEL M STRL SZ7.5 (GLOVE) ×3 IMPLANT
GLOVE SS BIOGEL STRL SZ 8 (GLOVE) ×1 IMPLANT
GLOVE SUPERSENSE BIOGEL SZ 8 (GLOVE) ×3
GOWN STRL REUS W/ TWL LRG LVL3 (GOWN DISPOSABLE) ×3 IMPLANT
GOWN STRL REUS W/ TWL XL LVL3 (GOWN DISPOSABLE) ×3 IMPLANT
GOWN STRL REUS W/TWL LRG LVL3 (GOWN DISPOSABLE) ×6
GOWN STRL REUS W/TWL XL LVL3 (GOWN DISPOSABLE) ×6
HANDPIECE INTERPULSE COAX TIP (DISPOSABLE)
KIT BASIN OR (CUSTOM PROCEDURE TRAY) ×2 IMPLANT
KIT ROOM TURNOVER OR (KITS) ×2 IMPLANT
MANIFOLD NEPTUNE II (INSTRUMENTS) ×2 IMPLANT
NDL HYPO 25GX1X1/2 BEV (NEEDLE) IMPLANT
NEEDLE HYPO 25GX1X1/2 BEV (NEEDLE) ×2 IMPLANT
NS IRRIG 1000ML POUR BTL (IV SOLUTION) ×2 IMPLANT
PACK ORTHO EXTREMITY (CUSTOM PROCEDURE TRAY) ×3 IMPLANT
PAD ARMBOARD 7.5X6 YLW CONV (MISCELLANEOUS) ×4 IMPLANT
PAD CAST 4YDX4 CTTN HI CHSV (CAST SUPPLIES) ×1 IMPLANT
PADDING CAST COTTON 4X4 STRL (CAST SUPPLIES) ×4
SET HNDPC FAN SPRY TIP SCT (DISPOSABLE) IMPLANT
SPONGE GAUZE 4X4 12PLY (GAUZE/BANDAGES/DRESSINGS) ×3 IMPLANT
SPONGE GAUZE 4X4 12PLY STER LF (GAUZE/BANDAGES/DRESSINGS) ×1 IMPLANT
SPONGE LAP 18X18 X RAY DECT (DISPOSABLE) ×2 IMPLANT
SPONGE LAP 4X18 X RAY DECT (DISPOSABLE) ×2 IMPLANT
SYR CONTROL 10ML LL (SYRINGE) IMPLANT
TOWEL OR 17X24 6PK STRL BLUE (TOWEL DISPOSABLE) ×2 IMPLANT
TOWEL OR 17X26 10 PK STRL BLUE (TOWEL DISPOSABLE) ×2 IMPLANT
TUBE ANAEROBIC SPECIMEN COL (MISCELLANEOUS) IMPLANT
TUBE CONNECTING 12X1/4 (SUCTIONS) ×3 IMPLANT
TUBING CYSTO DISP (UROLOGICAL SUPPLIES) ×2 IMPLANT
WATER STERILE IRR 1000ML POUR (IV SOLUTION) ×2 IMPLANT
YANKAUER SUCT BULB TIP NO VENT (SUCTIONS) ×3 IMPLANT

## 2013-05-17 NOTE — Anesthesia Postprocedure Evaluation (Signed)
  Anesthesia Post-op Note  Patient: Amy Hodge  Procedure(s) Performed: Procedure(s): IRRIGATION AND DEBRIDEMENT EXTREMITY (Bilateral)  Patient Location: PACU  Anesthesia Type:General  Level of Consciousness: awake and alert   Airway and Oxygen Therapy: Patient Spontanous Breathing  Post-op Pain: mild  Post-op Assessment: Post-op Vital signs reviewed, Patient's Cardiovascular Status Stable, Respiratory Function Stable, Patent Airway, No signs of Nausea or vomiting and Pain level controlled  Post-op Vital Signs: Reviewed and stable  Complications: No apparent anesthesia complications

## 2013-05-17 NOTE — Preoperative (Addendum)
Beta Blockers   Reason not to administer Beta Blockers:  Coreg held this morning at 8:00 b/c NPO.  To evaluate intraop if needed

## 2013-05-17 NOTE — Op Note (Signed)
See dictation [phone]  Recommend continued observation. We will certainly be performing dressing changes and other measures for the patient as she has significant wounds to the hands.  I discussed all issues with the family.  I would recommend Unasyn as the antibiotic of choice and continue this.  I would recommend inpatient status until her hands are moving in a comfortable direction.  Roseanne Kaufman M.D.  -

## 2013-05-17 NOTE — Consult Note (Signed)
  Patient seen and examined  We will prepare for I&D today.  All questions encouraged and answered.  Keanthony Poole M.D.

## 2013-05-17 NOTE — Consult Note (Signed)
Wound care follow-up: Pt now followed by hand surgery team for assessment and plan of care.  Please refer to them for further questions. And re-consult if further assistance is needed.  Thank-you,  Julien Girt MSN, [place], [place], [place], [place]

## 2013-05-17 NOTE — Progress Notes (Signed)
[facility]  Amy Hodge ZOX:[id number] DOB: December 21, 1955 DOA: 05/15/2013 PCP: Cathlean Cower, MD  Brief narrative: 58 year old female patient with known history of newly diagnosed coronary artery disease. She is post MI sustained in February 2015. During that hospitalization she underwent stent placement and stent placement developed actual myocardial infarction. That hospitalization was also complicated by pericarditis and patient had been discharged on Motrin causes and colchicine.  She presents to the emergency department with 2 complaints: Chest pain and bilateral hand injuries related to dog bite. Patient apparently had been attacked by a neighbors pulled off on attempting to keep that don't from biting her dog. She was bitten on both hands quite significantly. After the attack she began having significant chest discomfort similar to her previous MI symptoms. Because she initially presented with cardiac symptoms cardiology team evaluated the patient in the ER and opted not to start heparin drip since she had no ischemic changes on her EKG and her troponins were only mildly elevated and this could be explained by her recent pericarditis. X-rays were performed of her hands which showed no bony injuries and only soft tissue injuries. She was empirically started on Unasyn and admitted  Assessment/Plan: Active Problems:   CAD/Chest pain/History of pericarditis -Per cardiology -Echocardiogram without evidence of pericardial effusion; preserved LV fnx and NO wall motion abnormalities -TNI peak at 1.50 with current trend down -No current chest pain-Cardiologist recommends ambulate in halls as would at home to determine if recurrent angina (see CARDIOLOGY NOTE)- if stable without significant sx's she would be appropriate to dc from a Cardiology standpoint. -Continue aspirin, Plavix, carvedilol and hydrochlorothiazide for known CAD -Continue colchicine for recent  pericarditis- dc at discretion of CARDIOLOGIST -since has been on colchicine and higher dose NSAIDs monitor for signs of GI etiology for CP-on PPI   Grade 1 DD -? Known pre admit -cautious administration of IVFs and keep BP controlled    Bite from dog/Soft tissue injury of multiple sites/bilateral hands -Endorses significant discomfort primarily in left hand/left index finger -Continue empiric Unasyn -Consulted orthopedic hand specialist due to concerns may have underlying soft tissue/ligamentous injuries not able to be seen on plain x-ray-OR planned 3/13    HYPOTHYROIDISM -Continue Synthroid    HYPERLIPIDEMIA -Continue Zocor    COPD -Compensated without evidence of wheezing -Continue fluticasone   DVT prophylaxis: subCutaneous heparin Code Status: Full Family Communication: Husband and sisters at bedside Disposition Plan/Expected LOS: Remain in step down until after results returned   Consultants: Cardiology Orthopedic hand specialist  Procedures: 2-D echocardiogram  - Left ventricle: The cavity size was normal. Wall thickness was increased in a pattern of mild LVH. Systolic function was normal. The estimated ejection fraction was in the range of 60% to 65%. Wall motion was normal; there were no regional wall motion abnormalities. Doppler parameters are consistent with abnormal left ventricular relaxation (grade 1 diastolic dysfunction).   Antibiotics: Unasyn 3/11 >>>  HPI/Subjective: Patient alert and endorsing improved pain. Husband at bedside- attempting to answer his questions but he's very defensive and obviously quite worried about his wife's cardiac status and upcoming surgery. Was re assured that all studies have been reviewed and benefit of surgery outweighs risk. He unfortunately was not satisfied with our answers although the pt (who is compes mentus) verbalized understanding of current plan of care as outlined by primary team and  consultants.  Objective: Blood pressure 103/59, pulse 81, temperature 98.7 F (37.1 C), temperature source  Oral, resp. rate 16, height 5' 6.5" (1.689 m), weight 180 lb 1.9 oz (81.7 kg), SpO2 93.00%.  Intake/Output Summary (Last 24 hours) at 05/17/13 1337 Last data filed at 05/17/13 0016  Gross per 24 hour  Intake 792.67 ml  Output      0 ml  Net 792.67 ml     Exam: General: No acute respiratory distress Lungs: Clear to auscultation bilaterally without wheezes or crackles, RA Cardiovascular: Regular rate and rhythm without murmur gallop or rub normal S1 and S2, no peripheral edema or JVD Abdomen: Nontender, nondistended, soft, bowel sounds positive, no rebound, no ascites, no appreciable mass Musculoskeletal: No significant cyanosis, clubbing of bilateral lower extremities-noted multiple puncture wounds on both hands with left hand being more involved with more edema and discoloration especially to left index finger; no active bleeding or purulence noted Neurological: Alert and oriented x 3, moves all extremities x 4 without focal neurological deficits, CN 2-12 intact  Scheduled Meds:  Scheduled Meds: . ampicillin-sulbactam (UNASYN) IV  3 g Intravenous Q6H  . aspirin EC  81 mg Oral Daily  . carvedilol  3.125 mg Oral BID WC  . clopidogrel  75 mg Oral Q breakfast  . colchicine  0.6 mg Oral BID  . DULoxetine  60 mg Oral Daily  . fluticasone  1 puff Inhalation BID  . heparin  5,000 Units Subcutaneous 3 times per day  . hydrochlorothiazide  12.5 mg Oral Daily  . levothyroxine  175 mcg Oral QAC breakfast  . pantoprazole  40 mg Oral Daily  . pravastatin  40 mg Oral q1800  . sodium chloride  3 mL Intravenous Q12H   Continuous Infusions:   Data Reviewed: Basic Metabolic Panel:  Recent Labs Lab 05/15/13 1702 05/16/13 0053  NA 141 141  K 3.9 3.2*  CL 102 103  CO2 22 23  GLUCOSE 85 99  BUN 16 12  CREATININE 0.67 0.65  CALCIUM 9.2 8.5   Liver Function Tests: No results  found for this basename: AST, ALT, ALKPHOS, BILITOT, PROT, ALBUMIN,  in the last 168 hours No results found for this basename: LIPASE, AMYLASE,  in the last 168 hours No results found for this basename: AMMONIA,  in the last 168 hours CBC:  Recent Labs Lab 05/15/13 1702 05/16/13 0053  WBC 6.8 8.1  HGB 13.2 12.0  HCT 38.9 34.9*  MCV 83.8 84.1  PLT 190 146*   Cardiac Enzymes:  Recent Labs Lab 05/15/13 1923 05/16/13 0053 05/16/13 0650  CKTOTAL 132  --   --   CKMB 6.0*  --   --   TROPONINI 1.50* 1.94* 0.93*   BNP (last 3 results) No results found for this basename: PROBNP,  in the last 8760 hours CBG: No results found for this basename: GLUCAP,  in the last 168 hours  Recent Results (from the past 240 hour(s))  MRSA PCR SCREENING     Status: None   Collection Time    05/16/13  2:09 AM      Result Value Ref Range Status   MRSA by PCR NEGATIVE  NEGATIVE Final   Comment:            The GeneXpert MRSA Assay (FDA     approved for NASAL specimens     only), is one component of a     comprehensive MRSA colonization     surveillance program. It is not     intended to diagnose MRSA     infection nor to guide  or     monitor treatment for     MRSA infections.     Studies:  Recent x-ray studies have been reviewed in detail by the Attending Physician  Time spent :     Erin Hearing, [place] [facility]  [phone] Pager [phone]  **If unable to reach the above provider after paging please contact the [place] @ [phone]  On-Call/Text Page:      [email or web address]      password [email or web address]  If 7PM-7AM, please contact night-coverage [email or web address] Password [email or web address] 05/17/2013, 1:37 PM   LOS: 2 days

## 2013-05-17 NOTE — Progress Notes (Signed)
[facility]  ARTEMISA SLADEK VOJ:[id number] DOB: [birth year] DOA: 05/15/2013 PCP: Cathlean Cower, MD  Brief narrative: 58 year old female patient with known history of newly diagnosed coronary artery disease. She is post MI sustained in February 2015. During that hospitalization she underwent stent placement and stent placement developed actual myocardial infarction. That hospitalization was also complicated by pericarditis and patient had been discharged on Motrin causes and colchicine.  She presents to the emergency department with 2 complaints: Chest pain and bilateral hand injuries related to dog bite. Patient apparently had been attacked by a neighbors pulled off on attempting to keep that don't from biting her dog. She was bitten on both hands quite significantly. After the attack she began having significant chest discomfort similar to her previous MI symptoms. Because she initially presented with cardiac symptoms cardiology team evaluated the patient in the ER and opted not to start heparin drip since she had no ischemic changes on her EKG and her troponins were only mildly elevated and this could be explained by her recent pericarditis. X-rays were performed of her hands which showed no bony injuries and only soft tissue injuries. She was empirically started on Unasyn and admitted  Assessment/Plan: Active Problems:   CAD/Chest pain/History of pericarditis -Per cardiology -Echocardiogram without evidence of pericardial effusion; preserved LV fnx and NO wall motion abnormalities -TNI peak at 1.50 with current trend down -No current chest pain-Cardiologist recommends ambulate in halls as would at home to determine if recurrent angina (see CARDIOLOGY NOTE)- if stable without significant sx's she would be appropriate to dc from a Cardiology standpoint. -Continue aspirin, Plavix, carvedilol and hydrochlorothiazide for known CAD -Continue colchicine for recent  pericarditis- dc at discretion of CARDIOLOGIST -since has been on colchicine and higher dose NSAIDs monitor for signs of GI etiology for CP-on PPI   Grade 1 DD -? Known pre admit -cautious administration of IVFs and keep BP controlled    Bite from dog/Soft tissue injury of multiple sites/bilateral hands -Endorses significant discomfort primarily in left hand/left index finger -Continue empiric Unasyn -Consulted orthopedic hand specialist due to concerns may have underlying soft tissue/ligamentous injuries not able to be seen on plain x-ray-OR planned 3/13    HYPOTHYROIDISM -Continue Synthroid    HYPERLIPIDEMIA -Continue Zocor    COPD -Compensated without evidence of wheezing -Continue fluticasone   DVT prophylaxis: subCutaneous heparin Code Status: Full Family Communication: Husband and sisters at bedside Disposition Plan/Expected LOS: transfer to med/surg   Consultants: Cardiology Orthopedic hand specialist  Procedures: 2-D echocardiogram  - Left ventricle: The cavity size was normal. Wall thickness was increased in a pattern of mild LVH. Systolic function was normal. The estimated ejection fraction was in the range of 60% to 65%. Wall motion was normal; there were no regional wall motion abnormalities. Doppler parameters are consistent with abnormal left ventricular relaxation (grade 1 diastolic dysfunction).   Antibiotics: Unasyn 3/11 >>>  HPI/Subjective: Patient alert and endorsing improved pain. Husband at bedside- attempting to answer his questions but he's very defensive and obviously quite worried about his wife's cardiac status and upcoming surgery. Was re assured that all studies have been reviewed and benefit of surgery outweighs risk. He unfortunately was not satisfied with our answers although the pt (who is compes mentus) verbalized understanding of current plan of care as outlined by primary team and consultants.  Objective: Blood pressure 103/59,  pulse 81, temperature 98.4 F (36.9 C), temperature source Oral, resp. rate 16, height  5' 6.5" (1.689 m), weight 81.7 kg (180 lb 1.9 oz), SpO2 93.00%.  Intake/Output Summary (Last 24 hours) at 05/17/13 1434 Last data filed at 05/17/13 0016  Gross per 24 hour  Intake 542.67 ml  Output      0 ml  Net 542.67 ml     Exam: General: No acute respiratory distress Lungs: Clear to auscultation bilaterally without wheezes or crackles, RA Cardiovascular: Regular rate and rhythm without murmur gallop or rub normal S1 and S2, no peripheral edema or JVD Abdomen: Nontender, nondistended, soft, bowel sounds positive, no rebound, no ascites, no appreciable mass Musculoskeletal: No significant cyanosis, clubbing of bilateral lower extremities-noted multiple puncture wounds on both hands with left hand being more involved with more edema and discoloration especially to left index finger; no active bleeding or purulence noted Neurological: Alert and oriented x 3, moves all extremities x 4 without focal neurological deficits, CN 2-12 intact  Scheduled Meds:  Scheduled Meds: . [MAR HOLD] ampicillin-sulbactam (UNASYN) IV  3 g Intravenous Q6H  . [facility] HOLD] aspirin EC  81 mg Oral Daily  . [facility] HOLD] carvedilol  3.125 mg Oral BID WC  . [facility] HOLD] clopidogrel  75 mg Oral Q breakfast  . [MAR HOLD] colchicine  0.6 mg Oral BID  . [facility] HOLD] DULoxetine  60 mg Oral Daily  . [MAR HOLD] fluticasone  1 puff Inhalation BID  . [MAR HOLD] heparin  5,000 Units Subcutaneous 3 times per day  . [facility] HOLD] hydrochlorothiazide  12.5 mg Oral Daily  . [facility] HOLD] levothyroxine  175 mcg Oral QAC breakfast  . [MAR HOLD] pantoprazole  40 mg Oral Daily  . [facility] HOLD] pravastatin  40 mg Oral q1800  . [facility] HOLD] sodium chloride  3 mL Intravenous Q12H   Continuous Infusions: . lactated ringers 10 mL/hr at 05/17/13 1431    Data Reviewed: Basic Metabolic Panel:  Recent Labs Lab 05/15/13 1702 05/16/13 0053  NA 141 141  K 3.9  3.2*  CL 102 103  CO2 22 23  GLUCOSE 85 99  BUN 16 12  CREATININE 0.67 0.65  CALCIUM 9.2 8.5   Liver Function Tests: No results found for this basename: AST, ALT, ALKPHOS, BILITOT, PROT, ALBUMIN,  in the last 168 hours No results found for this basename: LIPASE, AMYLASE,  in the last 168 hours No results found for this basename: AMMONIA,  in the last 168 hours CBC:  Recent Labs Lab 05/15/13 1702 05/16/13 0053  WBC 6.8 8.1  HGB 13.2 12.0  HCT 38.9 34.9*  MCV 83.8 84.1  PLT 190 146*   Cardiac Enzymes:  Recent Labs Lab 05/15/13 1923 05/16/13 0053 05/16/13 0650  CKTOTAL 132  --   --   CKMB 6.0*  --   --   TROPONINI 1.50* 1.94* 0.93*   BNP (last 3 results) No results found for this basename: PROBNP,  in the last 8760 hours CBG: No results found for this basename: GLUCAP,  in the last 168 hours  Recent Results (from the past 240 hour(s))  MRSA PCR SCREENING     Status: None   Collection Time    05/16/13  2:09 AM      Result Value Ref Range Status   MRSA by PCR NEGATIVE  NEGATIVE Final   Comment:            The GeneXpert MRSA Assay (FDA     approved for NASAL specimens     only), is one component of a  comprehensive MRSA colonization     surveillance program. It is not     intended to diagnose MRSA     infection nor to guide or     monitor treatment for     MRSA infections.     Studies:  Recent x-ray studies have been reviewed in detail by the Attending Physician  Time spent :     Erin Hearing, [place] [facility]  [phone] Pager [phone]  **If unable to reach the above provider after paging please contact the [place] @ [phone]  On-Call/Text Page:      [email or web address]      password [email or web address]  If 7PM-7AM, please contact night-coverage [email or web address] Password [email or web address] 05/17/2013, 2:34 PM   LOS: 2 days   I have examined the patient, reviewed the chart and modified the above note which I agree with.   Dreden Rivere,MD Pager # [phone]  [email or web address] 05/17/2013, 2:34 PM

## 2013-05-17 NOTE — Anesthesia Procedure Notes (Signed)
Procedure Name: LMA Insertion Date/Time: 05/17/2013 5:00 PM Performed by: Eligha Bridegroom Pre-anesthesia Checklist: Patient identified, Timeout performed, Suction available, Patient being monitored and Emergency Drugs available Patient Re-evaluated:Patient Re-evaluated prior to inductionOxygen Delivery Method: Circle system utilized Preoxygenation: Pre-oxygenation with 100% oxygen Intubation Type: IV induction LMA: LMA inserted LMA Size: 4.0 Number of attempts: 1 Placement Confirmation: breath sounds checked- equal and bilateral Tube secured with: Tape

## 2013-05-17 NOTE — Anesthesia Preprocedure Evaluation (Signed)
Anesthesia Evaluation  Patient identified by MRN, date of birth, ID band Patient awake    Reviewed: Allergy & Precautions, H&P , NPO status , Patient's Chart, lab work & pertinent test results  Airway       Dental   Pulmonary asthma , COPD COPD inhaler, former smoker,          Cardiovascular     Neuro/Psych  Headaches, Depression    GI/Hepatic GERD-  ,  Endo/Other  Hypothyroidism   Renal/GU      Musculoskeletal  (+) Arthritis -, Fibromyalgia -  Abdominal   Peds  Hematology   Anesthesia Other Findings   Reproductive/Obstetrics                           Anesthesia Physical Anesthesia Plan  ASA: II  Anesthesia Plan: General   Post-op Pain Management:    Induction: Intravenous  Airway Management Planned: LMA  Additional Equipment:   Intra-op Plan:   Post-operative Plan: Extubation in OR  Informed Consent: I have reviewed the patients History and Physical, chart, labs and discussed the procedure including the risks, benefits and alternatives for the proposed anesthesia with the patient or authorized representative who has indicated his/her understanding and acceptance.     Plan Discussed with:   Anesthesia Plan Comments:         Anesthesia Quick Evaluation

## 2013-05-17 NOTE — OR Nursing (Signed)
Pt arrived to pacu at 1830 from [place].  Pt c/o chest pain as well as bilateral hand pain upon arrival to pacu. No EKG changes on the monitor in lead II. Pt. States that it feels similar to the chest pain she has been having prior to surgery.  Pt. Had recent MI with stent placement and pericarditis.  Managed medically at home prior to dog bites.  Pt. Already on oxygen, plavix, asa, and beta blockers.  Pt treated with fentanyl and dilaudid for pain.  Dr. Chriss Driver informed.  Pt. States the chest pain is gone after dose of pain medicine.

## 2013-05-17 NOTE — Transfer of Care (Signed)
Immediate Anesthesia Transfer of Care Note  Patient: Amy Hodge  Procedure(s) Performed: Procedure(s): IRRIGATION AND DEBRIDEMENT EXTREMITY (Bilateral)  Patient Location: PACU  Anesthesia Type:General  Level of Consciousness: awake, alert  and oriented  Airway & Oxygen Therapy: Patient Spontanous Breathing and Patient connected to nasal cannula oxygen  Post-op Assessment: Report given to PACU RN and Post -op Vital signs reviewed and stable  Post vital signs: Reviewed and stable  Complications: No apparent anesthesia complications

## 2013-05-18 LAB — CBC
HCT: 36.1 % (ref 36.0–46.0)
Hemoglobin: 12 g/dL (ref 12.0–15.0)
MCH: 28.8 pg (ref 26.0–34.0)
MCHC: 33.2 g/dL (ref 30.0–36.0)
MCV: 86.6 fL (ref 78.0–100.0)
Platelets: 181 10*3/uL (ref 150–400)
RBC: 4.17 MIL/uL (ref 3.87–5.11)
RDW: 13.3 % (ref 11.5–15.5)
WBC: 5.4 10*3/uL (ref 4.0–10.5)

## 2013-05-18 LAB — BASIC METABOLIC PANEL
BUN: 6 mg/dL (ref 6–23)
CO2: 27 mEq/L (ref 19–32)
CREATININE: 0.62 mg/dL (ref 0.50–1.10)
Calcium: 8.9 mg/dL (ref 8.4–10.5)
Chloride: 101 mEq/L (ref 96–112)
GFR calc Af Amer: >90 mL/min (ref >90)
GFR calc non Af Amer: >90 mL/min (ref >90)
Glucose, Bld: 107 mg/dL — ABNORMAL HIGH (ref 70–99)
Potassium: 3.7 mEq/L (ref 3.7–5.3)
Sodium: 140 mEq/L (ref 137–147)

## 2013-05-18 MED ORDER — OXYCODONE HCL ER 15 MG PO T12A
15.0000 mg | EXTENDED_RELEASE_TABLET | Freq: Two times a day (BID) | ORAL | Status: DC
  Administered 2013-05-18: 15 mg via ORAL
  Filled 2013-05-18 (×2): qty 1

## 2013-05-18 MED ORDER — DIPHENHYDRAMINE HCL 25 MG PO CAPS
50.0000 mg | ORAL_CAPSULE | Freq: Every evening | ORAL | Status: DC | PRN
  Administered 2013-05-18: 50 mg via ORAL
  Filled 2013-05-18: qty 2

## 2013-05-18 MED ORDER — DOCUSATE SODIUM 100 MG PO CAPS
200.0000 mg | ORAL_CAPSULE | Freq: Two times a day (BID) | ORAL | Status: DC
  Administered 2013-05-18 – 2013-05-19 (×4): 200 mg via ORAL
  Filled 2013-05-18 (×7): qty 2

## 2013-05-18 MED ORDER — POLYETHYLENE GLYCOL 3350 17 G PO PACK
17.0000 g | PACK | Freq: Every day | ORAL | Status: DC | PRN
  Filled 2013-05-18: qty 1

## 2013-05-18 NOTE — Progress Notes (Addendum)
[facility]  ARRIANNA CATALA VQM:[id number] DOB: [birth year] DOA: 05/15/2013 PCP: Cathlean Cower, MD  Brief narrative: 58 year old female patient with known history of newly diagnosed coronary artery disease. She is post MI sustained in February 2015. During that hospitalization she underwent stent placement and stent placement developed actual myocardial infarction. That hospitalization was also complicated by pericarditis and patient had been discharged on Motrin causes and colchicine.  She presents to the emergency department with 2 complaints: Chest pain and bilateral hand injuries related to dog bite. Patient apparently had been attacked by a neighbors pulled off on attempting to keep that don't from biting her dog. She was bitten on both hands quite significantly. After the attack she began having significant chest discomfort similar to her previous MI symptoms. Because she initially presented with cardiac symptoms cardiology team evaluated the patient in the ER and opted not to start heparin drip since she had no ischemic changes on her EKG and her troponins were only mildly elevated and this could be explained by her recent pericarditis. X-rays were performed of her hands which showed no bony injuries and only soft tissue injuries. She was empirically started on Unasyn and admitted HPI/Subjective: Pt has no complaints other than some pain- Vicodin is controlling pain better than Dilaudid- I suggested we add long acting (oxycontin) to help her sleep longer at night without having to wake up for pain meds- she agrees-she will try to cut back on Vicodin to 1 tab if Oxyontin effective  - RN noted pt to be coughing- per patient, this is no worse than usual and is from COPD- mucous a bit yellow today  Assessment/Plan: Active Problems:   CAD/Chest pain/History of pericarditis -Per cardiology -Echocardiogram without evidence of pericardial effusion; preserved LV  fnx and NO wall motion abnormalities -TNI peak at 1.50 with current trend down -No current chest pain-Cardiologist recommends ambulate in halls as would at home to determine if recurrent angina (see CARDIOLOGY NOTE)- if stable without significant sx's she would be appropriate to dc from a Cardiology standpoint. -Continue aspirin, Plavix, carvedilol and hydrochlorothiazide for known CAD -Continue colchicine for recent pericarditis- dc at discretion of CARDIOLOGIST -since has been on colchicine and higher dose NSAIDs monitor for signs of GI etiology for CP-on PPI   Grade 1 DD -? Known pre admit -cautious administration of IVFs and keep BP controlled    Bite from dog/Soft tissue injury of multiple sites/bilateral hands -Endorses significant discomfort primarily in left hand/left index finger -Continue empiric Unasyn -underwent I and D on 3/13-cont IV antibiotics per Dr Amedeo Plenty-  will stay in the area for 2 wks to f/u with Dr Amedeo Plenty at [facility]    COPD -Compensated - f/u for acute bacterial bronchitis  -Continue fluticasone   DVT prophylaxis: subCutaneous heparin Code Status: Full Family Communication: Husband and sisters at bedside on 3/13 Disposition Plan/Expected LOS: transfer to med/surg- IS, ambulate   Consultants: Cardiology Orthopedic hand specialist  Procedures: 2-D echocardiogram  - Left ventricle: The cavity size was normal. Wall thickness was increased in a pattern of mild LVH. Systolic function was normal. The estimated ejection fraction was in the range of 60% to 65%. Wall motion was normal; there were no regional wall motion abnormalities. Doppler parameters are consistent with abnormal left ventricular relaxation (grade 1 diastolic dysfunction).   Antibiotics: Unasyn 3/11 >>>    Objective: Blood pressure  113/79, pulse 72, temperature 98.5 F (36.9 C), temperature source Oral, resp.  rate 16, height 5' 6.5" (1.689 m), weight 81.7 kg (180 lb 1.9 oz), SpO2 93.00%.  Intake/Output Summary (Last 24 hours) at 05/18/13 1457 Last data filed at 05/18/13 0500  Gross per 24 hour  Intake 1687.33 ml  Output      0 ml  Net 1687.33 ml     Exam: General: No acute respiratory distress Lungs: Clear to auscultation bilaterally without wheezes or crackles, RA Cardiovascular: Regular rate and rhythm without murmur gallop or rub normal S1 and S2, no peripheral edema or JVD Abdomen: Nontender, nondistended, soft, bowel sounds positive, no rebound, no ascites, no appreciable mass Musculoskeletal: No significant cyanosis, clubbing of bilateral lower extremities-b/l hands and forearms in dressing  Neurological: Alert and oriented x 3, moves all extremities x 4 without focal neurological deficits, CN 2-12 intact  Scheduled Meds:  Scheduled Meds: . ampicillin-sulbactam (UNASYN) IV  3 g Intravenous Q6H  . aspirin EC  81 mg Oral Daily  . carvedilol  3.125 mg Oral BID WC  . clonazePAM  1 mg Oral QHS  . clopidogrel  75 mg Oral Q breakfast  . colchicine  0.6 mg Oral BID  . docusate sodium  200 mg Oral BID  . DULoxetine  60 mg Oral Daily  . fluticasone  1 puff Inhalation BID  . heparin  5,000 Units Subcutaneous 3 times per day  . hydrochlorothiazide  12.5 mg Oral Daily  . levothyroxine  175 mcg Oral QAC breakfast  . OxyCODONE  15 mg Oral Q12H  . pantoprazole  40 mg Oral Daily  . pravastatin  40 mg Oral q1800  . sodium chloride  3 mL Intravenous Q12H   Continuous Infusions: . lactated ringers 10 mL/hr at 05/17/13 1431    Data Reviewed: Basic Metabolic Panel:  Recent Labs Lab 05/15/13 1702 05/16/13 0053 05/18/13 0945  NA 141 141 140  K 3.9 3.2* 3.7  CL 102 103 101  CO2 22 23 27   GLUCOSE 85 99 107*  BUN 16 12 6   CREATININE 0.67 0.65 0.62  CALCIUM 9.2 8.5 8.9   Liver Function Tests: No results found for this basename: AST, ALT, ALKPHOS, BILITOT, PROT, ALBUMIN,  in the last  168 hours No results found for this basename: LIPASE, AMYLASE,  in the last 168 hours No results found for this basename: AMMONIA,  in the last 168 hours CBC:  Recent Labs Lab 05/15/13 1702 05/16/13 0053 05/18/13 0945  WBC 6.8 8.1 5.4  HGB 13.2 12.0 12.0  HCT 38.9 34.9* 36.1  MCV 83.8 84.1 86.6  PLT 190 146* 181   Cardiac Enzymes:  Recent Labs Lab 05/15/13 1923 05/16/13 0053 05/16/13 0650  CKTOTAL 132  --   --   CKMB 6.0*  --   --   TROPONINI 1.50* 1.94* 0.93*   BNP (last 3 results) No results found for this basename: PROBNP,  in the last 8760 hours CBG: No results found for this basename: GLUCAP,  in the last 168 hours  Recent Results (from the past 240 hour(s))  MRSA PCR SCREENING     Status: None   Collection Time    05/16/13  2:09 AM      Result Value Ref Range Status   MRSA by PCR NEGATIVE  NEGATIVE Final   Comment:            The GeneXpert MRSA Assay (FDA     approved for NASAL specimens  only), is one component of a     comprehensive MRSA colonization     surveillance program. It is not     intended to diagnose MRSA     infection nor to guide or     monitor treatment for     MRSA infections.     Studies:  Recent x-ray studies have been reviewed in detail by the Attending Physician  Time spent : 20 min    Ricardo Kayes,MD Pager # [phone] [email or web address] 05/18/2013, 2:57 PM

## 2013-05-18 NOTE — Progress Notes (Signed)
Patient received from stepdown , via wheelchair, monitor on, Nursing Assistant transporter.

## 2013-05-18 NOTE — Progress Notes (Signed)
Patient Name: Amy Hodge Date of Encounter: 05/18/2013     Active Problems:   HYPOTHYROIDISM   HYPERLIPIDEMIA   COPD   Bite from dog   Chest pain   History of pericarditis   Soft tissue injury of multiple sites/bilateral hands    SUBJECTIVE  Patient is doing well today. Denies chest pain. Telemetry shows NSR with T wave inversions.  CURRENT MEDS . ampicillin-sulbactam (UNASYN) IV  3 g Intravenous Q6H  . aspirin EC  81 mg Oral Daily  . carvedilol  3.125 mg Oral BID WC  . clonazePAM  1 mg Oral QHS  . clopidogrel  75 mg Oral Q breakfast  . colchicine  0.6 mg Oral BID  . docusate sodium  200 mg Oral BID  . DULoxetine  60 mg Oral Daily  . fluticasone  1 puff Inhalation BID  . heparin  5,000 Units Subcutaneous 3 times per day  . hydrochlorothiazide  12.5 mg Oral Daily  . levothyroxine  175 mcg Oral QAC breakfast  . OxyCODONE  15 mg Oral Q12H  . pantoprazole  40 mg Oral Daily  . pravastatin  40 mg Oral q1800  . sodium chloride  3 mL Intravenous Q12H    OBJECTIVE  Filed Vitals:   05/17/13 2300 05/18/13 0353 05/18/13 0700 05/18/13 0740  BP: 106/68 105/70  114/68  Pulse: 85 81  76  Temp: 98.3 F (36.8 C) 98.4 F (36.9 C) 98.6 F (37 C)   TempSrc: Oral Oral Oral   Resp: 13 15  14   Height:      Weight:      SpO2: 94% 96%  97%    Intake/Output Summary (Last 24 hours) at 05/18/13 1151 Last data filed at 05/18/13 0500  Gross per 24 hour  Intake 1687.33 ml  Output      0 ml  Net 1687.33 ml   Filed Weights   05/15/13 1726 05/15/13 2310  Weight: 170 lb (77.111 kg) 180 lb 1.9 oz (81.7 kg)    PHYSICAL EXAM  General: Pleasant, NAD. Neuro: Alert and oriented X 3. Moves all extremities spontaneously. Psych: Normal affect. HEENT:  Normal  Neck: Supple without bruits or JVD. Lungs:  Resp regular and unlabored, CTA. Heart: RRR no s3, s4, or murmurs. Abdomen: Soft, non-tender, non-distended, BS + x 4.  Extremities: Both hands bandaged Accessory Clinical  Findings  CBC  Recent Labs  05/16/13 0053 05/18/13 0945  WBC 8.1 5.4  HGB 12.0 12.0  HCT 34.9* 36.1  MCV 84.1 86.6  PLT 146* 149   Basic Metabolic Panel  Recent Labs  05/16/13 0053 05/18/13 0945  NA 141 140  K 3.2* 3.7  CL 103 101  CO2 23 27  GLUCOSE 99 107*  BUN 12 6  CREATININE 0.65 0.62  CALCIUM 8.5 8.9   Liver Function Tests No results found for this basename: AST, ALT, ALKPHOS, BILITOT, PROT, ALBUMIN,  in the last 72 hours No results found for this basename: LIPASE, AMYLASE,  in the last 72 hours Cardiac Enzymes  Recent Labs  05/15/13 1923 05/16/13 0053 05/16/13 0650  CKTOTAL 132  --   --   CKMB 6.0*  --   --   TROPONINI 1.50* 1.94* 0.93*   BNP No components found with this basename: POCBNP,  D-Dimer No results found for this basename: DDIMER,  in the last 72 hours Hemoglobin A1C No results found for this basename: HGBA1C,  in the last 72 hours Fasting Lipid Panel No results found  for this basename: CHOL, HDL, LDLCALC, TRIG, CHOLHDL, LDLDIRECT,  in the last 72 hours Thyroid Function Tests No results found for this basename: TSH, T4TOTAL, FREET3, T3FREE, THYROIDAB,  in the last 72 hours  TELE  NSR  ECG  Pending.  Radiology/Studies  Dg Chest 2 View  05/15/2013   CLINICAL DATA Animal bite.  Chest pain.  EXAM CHEST  2 VIEW  COMPARISON DG CHEST 2 VIEW dated 01/10/2008  FINDINGS Cardiopericardial silhouette within normal limits. Mediastinal contours normal. Trachea midline. No airspace disease or effusion. Basilar atelectasis. Left mid lung atelectasis or scarring. No displaced rib fractures or pneumothorax. Surgical clips in the thoracic inlet. ACDF.  IMPRESSION Basilar atelectasis.  No acute cardiopulmonary disease.  SIGNATURE  Electronically Signed   By: Dereck Ligas M.D.   On: 05/15/2013 19:04   Dg Hand Complete Left  05/15/2013   CLINICAL DATA Dog bite.  EXAM LEFT HAND - COMPLETE 3+ VIEW  COMPARISON None.  FINDINGS Anatomic alignment. No  fracture. Basal joint of the thumb osteoarthritis. No radiopaque foreign body. Skin irregularity is present over the dorsum of the hand, which may be posttraumatic.  IMPRESSION No acute osseous abnormality.  SIGNATURE  Electronically Signed   By: Dereck Ligas M.D.   On: 05/15/2013 19:05   Dg Hand Complete Right  05/15/2013   CLINICAL DATA Dog bite.  EXAM RIGHT HAND - COMPLETE 3+ VIEW  COMPARISON None.  FINDINGS Ulnar deviation of the small finger is probably positional. There is no fracture identified. Soft tissue swelling of the ulnar side of the hand is present with edema in the soft tissues. No fracture or radiopaque foreign body identified.  IMPRESSION No acute osseous abnormality.  SIGNATURE  Electronically Signed   By: Dereck Ligas M.D.   On: 05/15/2013 19:06    ASSESSMENT AND PLAN 1. Ischemic heart disease with MI and recent stents. 2. History of post-MI pericarditis, responding to colchicine. 3. Dog bite injury both hands.  Plan: Continue DAPT for stents. Continue colchicine for pericarditis. Okay to transfer to telemetry from cardiology standpoint. Will update EKG  Signed, Darlin Coco  MD

## 2013-05-18 NOTE — Progress Notes (Signed)
Patient ID: Amy Hodge, female   DOB: Jul 24, 1955, 58 y.o.   MRN: [id number] Patient seen and examined at bedside.  Patient is doing fairly well.  Labs are reviewed.  Procedure note: Patient underwent irrigation and debridement of skin subtending his tissue less than 20 square centimeters with curette knife blade and scissor tip. This was done about the right upper extremity without difficulty.  Her right upper extremity has much improved parameters I'm pleased this. I did remove her drains for the I&D.  Following this we then performed I&D of skin subcutaneous tissue about the left hand. This was an irrigation and debridement left hand with scissors did not blade and curette less than 20 cm. Copious lavage/irrigation was applied the patient tolerated this well. The patient had no signs of comp dating features.  Her left index finger still the most pressing issue in terms of his functional characteristics. I've encourage range of motion. I discussed with her all issues and plans. She tolerated the irrigation and debridement to about both the right and left hands without difficulty.  I would recommend continued Unasyn continued observation and close wound care.  I discussed her all plan for additional I&D and wound dressing change tomorrow. Hopefully she'll continue to improve. I discussed with her family that I would transition her back to home once she appears to be stable from a infectious standpoint about her hands.  I've discussed her than it is difficult to always give a specific number time and that typically the wound and infectious process tells [name] rather than we telling it when you'll be perfect  He was placed to see her day  Amy Hodge M.D.

## 2013-05-18 NOTE — Op Note (Signed)
Amy Hodge, Amy Hodge NO.:  [id number]  MEDICAL RECORD NO.:  [id number]  LOCATION:  [id number]                        FACILITY:  [facility]  PHYSICIAN:  Satira Anis. March Joos, M.D.DATE OF BIRTH:  09-21-1955  DATE OF PROCEDURE: DATE OF DISCHARGE:                              OPERATIVE REPORT   PREOPERATIVE DIAGNOSIS:  Multiple dog bites, right and left upper extremities, with significant left index finger involvement as well as multiple bites to the forearms, wrist, and palm regions of hands.  This patient has loss of left small finger flexion.  She also has significant disarray to the fingers in terms of multiple puncture wounds.  The left index finger predominates in terms of the mangled abnormality.  POSTOPERATIVE DIAGNOSIS:  Multiple dog bites, right and left upper extremities, with significant left index finger involvement as well as multiple bites to the forearms, wrist, and palm regions of hands.  This patient has loss of left small finger flexion. She also has significant disarray to the fingers in terms of multiple puncture wounds.  The left index finger predominates in terms of the mangled abnormality.  PROCEDURES: 1. Irrigation and debridement of skin, subcutaneous tissue, and muscle     in left forearm secondary to dog bite. 2. I and D, deep abscess x3, left dorsal hand. 3. I and D, skin and subcutaneous tissue, left small finger x2. 4. Flexor tenolysis tenosynovectomy, left small finger with remnant     FDS pseudotendon resection. 5. I and D, left forearm skin, subcutaneous tissue, and muscle. 6. I and D, left index finger DIP joint, skin, subcutaneous tissue,     and synovium. 7. FDP/FDS tenosynovectomy, extensive in nature through 3 separate     windows, left index finger. 8. Nail plate removal, left ring finger and nail bed repair of ring     finger. 9. I and D, nail bed injury with repair of left middle finger. 10.I and D, left small finger abscess, deep  in location. 11.I and D, left index finger abscess, deep in location about the     dorsal P1 region.  SURGEON:  Satira Anis. Amedeo Plenty, M.D.  ASSISTANT:  None.  COMPLICATIONS:  None.  ANESTHESIA:  General.  DRAINS:  Multiple.  TOURNIQUET TIME:  Less than an hour.  INDICATIONS:  This patient is a 58 year old female, who was trying to break up a dog fight between her dog and her neighbor's dog.  She was actually in town from [facility] visiting her daughter when the event occurred.  She had multiple bites.  She had been placed appropriately on Unasyn.  She has multiple bites, worsening clinical picture in the left hand and loss of flexion in the right small finger.  Interestingly, the small finger does not have as much erythema and cellulitis, but she has lost flexion and has a wound directly over the flexor apparatus.  This lends to question whether this is a flexor disruption or an old injury.  The patient and I have discussed this at length.  She desires to proceed with evaluation and irrigation and debridement.  Both the right and left hands have cellulitis dorsally and the left index finger has a large amount  of devitalized skin and what appears to be purulent flexor tenosynovitis about the left index finger.  I have discussed with the patient risks and benefits, do's and dont's. She being cleared from an internal medicine and cardiac standpoint despite a lengthy and serious cardiac history.  DESCRIPTION OF PROCEDURE:  The patient was seen by myself, taken to the procedure suite, and underwent general LMA anesthetic.  She was n.p.o. at this time.  She tolerated this well.  There are no complicating features, following this.  We then prepped and draped both arms, laid her in a supine position, padded her well, kept her warm, and called final time-out.  Once this was completed, I performed I and D of the right forearm, this was I and D of skin and subcutaneous tissue.   This was done with curette, knife blade incision tip, this was an excisional debridement.  Two areas were I and D'd without difficulty involving these areas.  Following this, I performed irrigation and debridement of 3 dorsal deep abscesses.  Three separate abscesses underwent uncovering with knife blade of an eschar followed by deep dissection and placement of vessel loop drains.  These were 3 separate irrigation and debridement secondary to deep abscess.  This was deep abscess I and D x3 about the dorsal hand centered ulnarly.  Following this, the hand was clipped and the palmar aspect was I and D'd.  There were 2 separate areas that underwent skin and subcutaneous I and D.  In the thenar and hypothenar region, she had puncture wounds, which underwent debridement.  This involved skin and subcutaneous tissue.  Following this, I made an incision transversely, dissected out, and identified the flexor apparatus proximal to the A1 pulley.  I performed a flexion check.  I noted that the FDS was remnant and had pseudotendon about it and had some degree of scarring about the FDP.  The patient interestingly had an intact, but elongated FDP.  There is not a frank rupture in the FDP; however, the FDS was attritionally ruptured and this was not from a dog bite, but an old injury.  I suspect this patient may have had injury at the hook of the hamate or more proximal level in the past or possibly even a musculotendinous tear.  Nevertheless, the tendon was component from its distal insertion on the distal phalanx to the area proximal to the A1 pulley.  I would suspect that she likely had some degree of old injury, given the tendinosis and remnant FDS tendon. Certainly one could consider a procedure in the future for her, if in fact she heals her wounds and a tendon transfer at the wrist level was accomplished.  I would recommend giving this time inpatient, see how she does, although the stretched  out look certainly makes one query whether there is a wrist level injury.  Given the fact that she had no advanced trauma over the carpal canal, I did not dissect this area as I did not want to over dissect in this situation.  Given that the FDP is intact from the area of the A1 pulley distally, there are many options in the future once she clears her traumatic process.  This area was irrigated. The FDS pseudotendon was resected and the FDP was noted to be intact.  Following this, 3 L were placed to all these wounds, vessel loop drains kept intact, and the wound was then dressed with Adaptic, Xeroform gauze, Kling, Kerlix, and Ace wrap.  We will plan  for daily dressing changes and close observation.  I did close the transverse incision overlying the small finger and this was all done in under very sterile conditions once the I and D was performed.  Following this, attention was turned towards the left hand.  The left hand was dressed with I and D of small finger skin and subcutaneous tissue x2 wounds.  This was an excisional debridement with knife blade, curette, and curettage.  It was less than 20 square cm.  Following this, the patient had the ring finger addressed with nail plate removal and nail bed I and D and repair.  This was done without difficulty secondary to a large laceration from the tooth __________ about the ring finger.  The ring finger was dressed without difficulty, multiple puncture wounds over the ring finger, underwent I and D of skin and subcutaneous tissue, excisional in nature, however, there was no evidence of deep tendon penetration.  Following this, I performed forearm muscle I and D of skin, subcutaneous tissue, and muscle. This was an excisional debridement about the muscle of the left forearm.  This was volarly.  Once this was complete, attention was turned towards the index finger. The patient had a DIP injury.  I performed an I and D of the DIP  joint. Skin and subcutaneous tissue and synovial tissue was formally I and D'd. Following this, the dorsal aspect of the index finger underwent I and D of the deep abscess.  Once this was done, I then turned attention towards the flexor apparatus.  There were multiple puncture wounds here and I windowed areas just proximal to the A1 pulley with a knife blade and dissection down to the pulley system.  There was a large amount of hematoma in the pulley system, and this was decompressed.  Following this, I windowed the bite over the P1 region volarly and over the DIP volar crease.  These were evaluated and retracted.  The sheaths were open and violated, thus I set up a pediatric feeding catheter for through and through lavage.  I windowed the areas to make sure all areas of the flexor apparatus were lavaged, and I kept the wounds completely open and did not close these, other than a small closure overlying A1 wherein an incision was made for dissection.  The areas were copiously irrigated with greater than 3 L of saline after this.  I separately irrigated the index finger and the other wounds as the index finger required the pediatric feeding catheter.  The patient tolerated this well.  She will need constant supervision and close observation.  I would recommend continued Unasyn, close observatory care, and I will be available for her postop evaluations.  These notes have been discussed and all questions have been encouraged and answered.  She lives in [place].  She does have an extensive cardiac history. We would plan to keep her here in 2 weeks and get this under control in terms of the dog bite and its aftermath.  I have discussed with her these issues and she is aware and desires to proceed accordingly.  I have also spoken extensively with the family.  They understand this is a stressful situation, but certainly understand the reasons for aggressive management of these bites,  which clearly had early infection signs and abnormalities.     Satira Anis. Amedeo Plenty, M.D.     [facility]  D:  05/17/2013  T:  05/18/2013  Job:  [id number]

## 2013-05-19 DIAGNOSIS — F411 Generalized anxiety disorder: Secondary | ICD-10-CM

## 2013-05-19 DIAGNOSIS — F3289 Other specified depressive episodes: Secondary | ICD-10-CM

## 2013-05-19 DIAGNOSIS — F329 Major depressive disorder, single episode, unspecified: Secondary | ICD-10-CM

## 2013-05-19 DIAGNOSIS — J441 Chronic obstructive pulmonary disease with (acute) exacerbation: Secondary | ICD-10-CM

## 2013-05-19 MED ORDER — SACCHAROMYCES BOULARDII 250 MG PO CAPS
250.0000 mg | ORAL_CAPSULE | Freq: Two times a day (BID) | ORAL | Status: DC
  Administered 2013-05-19: 250 mg via ORAL
  Filled 2013-05-19 (×3): qty 1

## 2013-05-19 NOTE — Progress Notes (Signed)
Patient ID: ESBEYDI MANAGO, female   DOB: Nov 26, 1955, 58 y.o.   MRN: [id number] Patient seen at bedside.  Patient is doing fairly well.  Patient is alert oriented, has no chest pain and feels well. She's noticed increased movement in her fingers.  Procedure note: Patient underwent I&D of skin and subcutaneous tissue with knife blade scissor and curette about the left hand to include the index finger and ring finger. Her parameters look much improved she was irrigated dressing change and there no complications.  I then performed very careful and cautious removal of her right bandage and following this performed irrigation and debridement of skin and subcutaneous tissue which was less than 20 cm about the right hand to include dorsal ulna region. Patient tolerated this well without comp dating feature this was done with scissor knife blade and curette. This was an excisional debridement.  Overall her wound conditions look much improved. I'm pleased his.  I would recommend DC tomorrow after I perform dressing change in the morning. She'll be stable from my standpoint. I would recommend followup in [facility]. I would recommend Augmentin 875 mg twice a day x3 weeks. I would recommend a probiotics and a cup of yogurt everyday for her GI system.  I have gone over dressing changes with the patient how to perform them.  A lot feel she looks much better. I think she has a long way to go terms of full recovery. I've outlined to her the issues at hand. We specifically discussed her small finger right hand and the stretched out nature of her tendon which could be addressed with a elective surgical procedure in the very distant future.  W Evelisse Szalkowski M.D.

## 2013-05-19 NOTE — Progress Notes (Signed)
Patient Name: Amy Hodge Date of Encounter: 05/19/2013     Active Problems:   HYPOTHYROIDISM   HYPERLIPIDEMIA   COPD   Bite from dog   Chest pain   History of pericarditis   Soft tissue injury of multiple sites/bilateral hands    SUBJECTIVE  Feels well from heart standpoint. Has walked in hall. No  Chest pain or dyspnea or dizziness. Rhythm regular on bedside exam.  CURRENT MEDS . ampicillin-sulbactam (UNASYN) IV  3 g Intravenous Q6H  . aspirin EC  81 mg Oral Daily  . carvedilol  3.125 mg Oral BID WC  . clonazePAM  1 mg Oral QHS  . clopidogrel  75 mg Oral Q breakfast  . colchicine  0.6 mg Oral BID  . docusate sodium  200 mg Oral BID  . DULoxetine  60 mg Oral Daily  . fluticasone  1 puff Inhalation BID  . heparin  5,000 Units Subcutaneous 3 times per day  . hydrochlorothiazide  12.5 mg Oral Daily  . levothyroxine  175 mcg Oral QAC breakfast  . OxyCODONE  15 mg Oral Q12H  . pantoprazole  40 mg Oral Daily  . pravastatin  40 mg Oral q1800  . sodium chloride  3 mL Intravenous Q12H    OBJECTIVE  Filed Vitals:   05/18/13 1250 05/18/13 1300 05/18/13 2029 05/19/13 0500  BP: 113/79  115/74 118/85  Pulse: 72  81 85  Temp:  98.5 F (36.9 C) 98.5 F (36.9 C) 99 F (37.2 C)  TempSrc:  Oral Oral Oral  Resp: 16  12 12   Height:      Weight:      SpO2: 93%  92% 97%    Intake/Output Summary (Last 24 hours) at 05/19/13 1201 Last data filed at 05/19/13 0830  Gross per 24 hour  Intake    580 ml  Output      0 ml  Net    580 ml   Filed Weights   05/15/13 1726 05/15/13 2310  Weight: 170 lb (77.111 kg) 180 lb 1.9 oz (81.7 kg)    PHYSICAL EXAM  General: Pleasant, NAD. Neuro: Alert and oriented X 3. Moves all extremities spontaneously. Psych: Normal affect. HEENT:  Normal  Neck: Supple without bruits or JVD. Lungs:  Resp regular and unlabored, CTA. Heart: RRR no s3, s4, or murmurs. Abdomen: Soft, non-tender, non-distended, BS + x 4.  Extremities: Dressings  intact  Accessory Clinical Findings  CBC  Recent Labs  05/18/13 0945  WBC 5.4  HGB 12.0  HCT 36.1  MCV 86.6  PLT 101   Basic Metabolic Panel  Recent Labs  05/18/13 0945  NA 140  K 3.7  CL 101  CO2 27  GLUCOSE 107*  BUN 6  CREATININE 0.62  CALCIUM 8.9   Liver Function Tests No results found for this basename: AST, ALT, ALKPHOS, BILITOT, PROT, ALBUMIN,  in the last 72 hours No results found for this basename: LIPASE, AMYLASE,  in the last 72 hours Cardiac Enzymes No results found for this basename: CKTOTAL, CKMB, CKMBINDEX, TROPONINI,  in the last 72 hours BNP No components found with this basename: POCBNP,  D-Dimer No results found for this basename: DDIMER,  in the last 72 hours Hemoglobin A1C No results found for this basename: HGBA1C,  in the last 72 hours Fasting Lipid Panel No results found for this basename: CHOL, HDL, LDLCALC, TRIG, CHOLHDL, LDLDIRECT,  in the last 72 hours Thyroid Function Tests No results found for this  basename: TSH, T4TOTAL, FREET3, T3FREE, THYROIDAB,  in the last 72 hours  TELE    ECG  NSR, nonspecific inferolateral T wave inversions.   Radiology/Studies  Dg Chest 2 View  05/15/2013   CLINICAL DATA Animal bite.  Chest pain.  EXAM CHEST  2 VIEW  COMPARISON DG CHEST 2 VIEW dated 01/10/2008  FINDINGS Cardiopericardial silhouette within normal limits. Mediastinal contours normal. Trachea midline. No airspace disease or effusion. Basilar atelectasis. Left mid lung atelectasis or scarring. No displaced rib fractures or pneumothorax. Surgical clips in the thoracic inlet. ACDF.  IMPRESSION Basilar atelectasis.  No acute cardiopulmonary disease.  SIGNATURE  Electronically Signed   By: Dereck Ligas M.D.   On: 05/15/2013 19:04   Dg Hand Complete Left  05/15/2013   CLINICAL DATA Dog bite.  EXAM LEFT HAND - COMPLETE 3+ VIEW  COMPARISON None.  FINDINGS Anatomic alignment. No fracture. Basal joint of the thumb osteoarthritis. No radiopaque  foreign body. Skin irregularity is present over the dorsum of the hand, which may be posttraumatic.  IMPRESSION No acute osseous abnormality.  SIGNATURE  Electronically Signed   By: Dereck Ligas M.D.   On: 05/15/2013 19:05   Dg Hand Complete Right  05/15/2013   CLINICAL DATA Dog bite.  EXAM RIGHT HAND - COMPLETE 3+ VIEW  COMPARISON None.  FINDINGS Ulnar deviation of the small finger is probably positional. There is no fracture identified. Soft tissue swelling of the ulnar side of the hand is present with edema in the soft tissues. No fracture or radiopaque foreign body identified.  IMPRESSION No acute osseous abnormality.  SIGNATURE  Electronically Signed   By: Dereck Ligas M.D.   On: 05/15/2013 19:06    ASSESSMENT AND PLAN  Okay for planned discharge tomorrow after Dr. Amedeo Plenty sees her for dressing change. She has an appointment in [facility] tomorrow afternoon with her cardiologist there. Continue same cardiac meds on discharge. Will sign off now.  Signed, Darlin Coco NP

## 2013-05-19 NOTE — Progress Notes (Signed)
Patient ID: Amy Hodge  female  CWC:[id number]    DOB: 12-03-1955    DOA: 05/15/2013  PCP: Cathlean Cower, MD  Assessment/Plan: Active Problems:   HYPOTHYROIDISM   HYPERLIPIDEMIA   COPD   Bite from dog   Chest pain   History of pericarditis   Soft tissue injury of multiple sites/bilateral hands  CAD/Chest pain/History of pericarditis : Chest pain resolved  -Echocardiogram without evidence of pericardial effusion; preserved LV fnx and NO wall motion abnormalities  -TNI peak at 1.50 with current trend down  - Per cardiology, stable for discharge tomorrow -Continue aspirin, Plavix, carvedilol and hydrochlorothiazide for known CAD  -Continue colchicine for recent pericarditis- dc at discretion of her primary cardiologist at [facility].  -since has been on colchicine and higher dose NSAIDs monitor for signs of GI etiology for CP-on PPI    Bite from dog/Soft tissue injury of multiple sites/bilateral hands  -Endorses significant discomfort primarily in left hand/left index finger  -Continue empiric Unasyn  -underwent I and D on 3/13-cont IV antibiotics per Dr Amedeo Plenty - Per orthopedics, okay to DC tomorrow after dressing change, Augmentin 875 mg twice a day x3 weeks with probiotic, added florastor   HYPOTHYROIDISM  -Continue Synthroid   HYPERLIPIDEMIA  -Continue Zocor   COPD  -Compensated  - f/u for acute bacterial bronchitis  -Continue fluticasone   DVT Prophylaxis:  Code Status:  Family Communication:Asian alert and oriented, discussed in detail with the patient  Disposition:Tomorrow  Consultants:  Cardiology, Dr. Mare Ferrari  Hand surgery, Dr. Amedeo Plenty  Procedures:  I and D  Antibiotics:  Unasyn    Subjective: Patient alert and oriented, no acute overnight issues. Feels no chest pain and is able to move her fingers better now   Objective: Weight change:   Intake/Output Summary (Last 24 hours) at 05/19/13 1803 Last data filed at 05/19/13 0830  Gross  per 24 hour  Intake    480 ml  Output      0 ml  Net    480 ml   Blood pressure 118/85, pulse 85, temperature 99 F (37.2 C), temperature source Oral, resp. rate 12, height 5' 6.5" (1.689 m), weight 81.7 kg (180 lb 1.9 oz), SpO2 97.00%.  Physical Exam: General: Alert and awake, oriented x3, not in any acute distress. CVS: S1-S2 clear, no murmur rubs or gallops Chest: clear to auscultation bilaterally, no wheezing, rales or rhonchi Abdomen: soft nontender, nondistended, normal bowel sounds  Extremities:  dressing intact to both hands  Neuro: Cranial nerves II-XII intact, no focal neurological deficits  Lab Results: Basic Metabolic Panel:  Recent Labs Lab 05/16/13 0053 05/18/13 0945  NA 141 140  K 3.2* 3.7  CL 103 101  CO2 23 27  GLUCOSE 99 107*  BUN 12 6  CREATININE 0.65 0.62  CALCIUM 8.5 8.9   Liver Function Tests: No results found for this basename: AST, ALT, ALKPHOS, BILITOT, PROT, ALBUMIN,  in the last 168 hours No results found for this basename: LIPASE, AMYLASE,  in the last 168 hours No results found for this basename: AMMONIA,  in the last 168 hours CBC:  Recent Labs Lab 05/16/13 0053 05/18/13 0945  WBC 8.1 5.4  HGB 12.0 12.0  HCT 34.9* 36.1  MCV 84.1 86.6  PLT 146* 181   Cardiac Enzymes:  Recent Labs Lab 05/15/13 1923 05/16/13 0053 05/16/13 0650  CKTOTAL 132  --   --   CKMB 6.0*  --   --   TROPONINI 1.50* 1.94*  0.93*   BNP: No components found with this basename: POCBNP,  CBG: No results found for this basename: GLUCAP,  in the last 168 hours   Micro Results: Recent Results (from the past 240 hour(s))  MRSA PCR SCREENING     Status: None   Collection Time    05/16/13  2:09 AM      Result Value Ref Range Status   MRSA by PCR NEGATIVE  NEGATIVE Final   Comment:            The GeneXpert MRSA Assay (FDA     approved for NASAL specimens     only), is one component of a     comprehensive MRSA colonization     surveillance program. It is  not     intended to diagnose MRSA     infection nor to guide or     monitor treatment for     MRSA infections.    Studies/Results: Dg Chest 2 View  05/15/2013   CLINICAL DATA Animal bite.  Chest pain.  EXAM CHEST  2 VIEW  COMPARISON DG CHEST 2 VIEW dated 01/10/2008  FINDINGS Cardiopericardial silhouette within normal limits. Mediastinal contours normal. Trachea midline. No airspace disease or effusion. Basilar atelectasis. Left mid lung atelectasis or scarring. No displaced rib fractures or pneumothorax. Surgical clips in the thoracic inlet. ACDF.  IMPRESSION Basilar atelectasis.  No acute cardiopulmonary disease.  SIGNATURE  Electronically Signed   By: Dereck Ligas M.D.   On: 05/15/2013 19:04   Dg Hand Complete Left  05/15/2013   CLINICAL DATA Dog bite.  EXAM LEFT HAND - COMPLETE 3+ VIEW  COMPARISON None.  FINDINGS Anatomic alignment. No fracture. Basal joint of the thumb osteoarthritis. No radiopaque foreign body. Skin irregularity is present over the dorsum of the hand, which may be posttraumatic.  IMPRESSION No acute osseous abnormality.  SIGNATURE  Electronically Signed   By: Dereck Ligas M.D.   On: 05/15/2013 19:05   Dg Hand Complete Right  05/15/2013   CLINICAL DATA Dog bite.  EXAM RIGHT HAND - COMPLETE 3+ VIEW  COMPARISON None.  FINDINGS Ulnar deviation of the small finger is probably positional. There is no fracture identified. Soft tissue swelling of the ulnar side of the hand is present with edema in the soft tissues. No fracture or radiopaque foreign body identified.  IMPRESSION No acute osseous abnormality.  SIGNATURE  Electronically Signed   By: Dereck Ligas M.D.   On: 05/15/2013 19:06    Medications: Scheduled Meds: . ampicillin-sulbactam (UNASYN) IV  3 g Intravenous Q6H  . aspirin EC  81 mg Oral Daily  . carvedilol  3.125 mg Oral BID WC  . clonazePAM  1 mg Oral QHS  . clopidogrel  75 mg Oral Q breakfast  . colchicine  0.6 mg Oral BID  . docusate sodium  200 mg Oral  BID  . DULoxetine  60 mg Oral Daily  . fluticasone  1 puff Inhalation BID  . heparin  5,000 Units Subcutaneous 3 times per day  . hydrochlorothiazide  12.5 mg Oral Daily  . levothyroxine  175 mcg Oral QAC breakfast  . OxyCODONE  15 mg Oral Q12H  . pantoprazole  40 mg Oral Daily  . pravastatin  40 mg Oral q1800  . saccharomyces boulardii  250 mg Oral BID  . sodium chloride  3 mL Intravenous Q12H      LOS: 4 days   Yacob Wilkerson M.D. [facility] 05/19/2013, 6:03 PM Pager: [phone]  If  7PM-7AM, please contact night-coverage [email or web address] Password [email or web address]

## 2013-05-20 DIAGNOSIS — J45901 Unspecified asthma with (acute) exacerbation: Secondary | ICD-10-CM

## 2013-05-20 DIAGNOSIS — J309 Allergic rhinitis, unspecified: Secondary | ICD-10-CM

## 2013-05-20 DIAGNOSIS — M549 Dorsalgia, unspecified: Secondary | ICD-10-CM

## 2013-05-20 MED ORDER — SACCHAROMYCES BOULARDII 250 MG PO CAPS: 250.0000 mg | ORAL_CAPSULE | Freq: Two times a day (BID) | ORAL | Status: DC

## 2013-05-20 MED ORDER — OXYCODONE HCL ER 15 MG PO T12A: 15.0000 mg | EXTENDED_RELEASE_TABLET | Freq: Two times a day (BID) | ORAL | Status: DC

## 2013-05-20 MED ORDER — AMOXICILLIN-POT CLAVULANATE 875-125 MG PO TABS: 1.0000 | ORAL_TABLET | Freq: Two times a day (BID) | ORAL | Status: DC

## 2013-05-20 MED ORDER — AMOXICILLIN-POT CLAVULANATE 875-125 MG PO TABS
1.0000 | ORAL_TABLET | Freq: Two times a day (BID) | ORAL | Status: DC
  Filled 2013-05-20 (×2): qty 1

## 2013-05-20 MED ORDER — HYDROCODONE-ACETAMINOPHEN 5-325 MG PO TABS: 1.0000 | ORAL_TABLET | ORAL | Status: DC | PRN

## 2013-05-20 MED ORDER — CLONAZEPAM 1 MG PO TABS: 1.0000 mg | ORAL_TABLET | Freq: Every evening | ORAL | Status: DC | PRN

## 2013-05-20 MED ORDER — COLCHICINE 0.6 MG PO TABS: 0.6000 mg | ORAL_TABLET | Freq: Two times a day (BID) | ORAL | Status: DC

## 2013-05-20 MED ORDER — DSS 100 MG PO CAPS: 200.0000 mg | ORAL_CAPSULE | Freq: Two times a day (BID) | ORAL | Status: DC

## 2013-05-20 NOTE — Progress Notes (Signed)
Patient ID: Amy Hodge, female   DOB: [birth year], 58 y.o.   MRN: [id number] Patient seen at bedside.  I perform dressing changes to the right and left upper extremities. The husband and family were at bedside as I requested so that we could go over all dressing supplies dressing changes and other issues as they are germane her upper extremity predicament.  I think she looks much better I feel that she is ready for discharge.  I'm very comfortable with her returning to [facility] and having followup there.  I gave her my card name and number if she desires followup in [place] that would be fine as well. I would like see her back in a week and a half or have other qualified surgeon see her in a week to week and a half for followup. She understands this. Once again I would recommend Augmentin 875 mg twice a day for 2 weeks.  I would also have a prescription for pain medicine.  Her hands look much better much improved without complicating feature. Dressing change was done without difficulty and there were no issues   We certainly wishes best the future. We recommend she continue range of motion and meticulous wound care  Jelene Albano M.D.

## 2013-05-20 NOTE — Discharge Summary (Signed)
Physician Discharge Summary  Patient ID: Amy Hodge MRN: [id number] DOB/AGE: 1955-07-12 58 y.o.  Admit date: 05/15/2013 Discharge date: 05/20/2013  Primary Care Physician:  Cathlean Cower, MD  Discharge Diagnoses:   Chest pain with history of CAD and pericarditis  . Bite from dog and Soft tissue injury of multiple sites/bilateral hands . Chest pain . COPD . HYPOTHYROIDISM . HYPERLIPIDEMIA   Consults:  Cardiology, Dr. Mare Ferrari                    Hand surgery, Dr. Amedeo Plenty   Recommendations for Outpatient Follow-up:  Patient was strongly recommended to followup with Dr. Amedeo Plenty in 2 weeks or have scheduled followup with surgery in [place]  Allergies:   Allergies  Allergen Reactions  . Levofloxacin     REACTION: hives  . Morphine And Related Other (See Comments)    headache  . Simvastatin     Makes my legs hurt/ sore   . Tetracycline     REACTION: hives  . Bactrim [Sulfamethoxazole-Trimethoprim] Anxiety and Other (See Comments)    Racing heart     Discharge Medications:   Medication List         albuterol 108 (90 BASE) MCG/ACT inhaler  Commonly known as:  PROVENTIL HFA;VENTOLIN HFA  Inhale 2 puffs into the lungs every 6 (six) hours as needed for wheezing or shortness of breath.     amoxicillin-clavulanate 875-125 MG per tablet  Commonly known as:  AUGMENTIN  Take 1 tablet by mouth 2 (two) times daily. X 2 weeks     aspirin EC 81 MG tablet  Take 81 mg by mouth daily.     beclomethasone 80 MCG/ACT inhaler  Commonly known as:  QVAR  Inhale 1 puff into the lungs daily as needed (shortness of breath).     beclomethasone 80 MCG/ACT inhaler  Commonly known as:  QVAR  Inhale 1 puff into the lungs as needed.     carvedilol 3.125 MG tablet  Commonly known as:  COREG  Take 3.125 mg by mouth 2 (two) times daily with a meal.     clonazePAM 1 MG tablet  Commonly known as:  KLONOPIN  Take 1 tablet (1 mg total) by mouth at bedtime as needed for anxiety.      clopidogrel 75 MG tablet  Commonly known as:  PLAVIX  Take 75 mg by mouth daily with breakfast.     colchicine 0.6 MG tablet  Take 1 tablet (0.6 mg total) by mouth 2 (two) times daily.     DSS 100 MG Caps  Take 200 mg by mouth 2 (two) times daily. For constipation     DULoxetine 60 MG capsule  Commonly known as:  CYMBALTA  Take 1 capsule (60 mg total) by mouth daily.     hydrochlorothiazide 25 MG tablet  Commonly known as:  HYDRODIURIL  Take 12.5 mg by mouth daily.     HYDROcodone-acetaminophen 5-325 MG per tablet  Commonly known as:  NORCO/VICODIN  Take 1-2 tablets by mouth every 4 (four) hours as needed for moderate pain or severe pain.     ibuprofen 800 MG tablet  Commonly known as:  ADVIL,MOTRIN  Take 800 mg by mouth 3 (three) times daily.     levothyroxine 175 MCG tablet  Commonly known as:  SYNTHROID  Take 1 tablet (175 mcg total) by mouth daily.     omeprazole 20 MG capsule  Commonly known as:  PRILOSEC  Take 1 capsule (20 mg  total) by mouth daily.     OxyCODONE 15 mg T12a 12 hr tablet  Commonly known as:  OXYCONTIN  Take 1 tablet (15 mg total) by mouth every 12 (twelve) hours.     pravastatin 40 MG tablet  Commonly known as:  PRAVACHOL  Take 40 mg by mouth every evening.     saccharomyces boulardii 250 MG capsule  Commonly known as:  FLORASTOR  Take 1 capsule (250 mg total) by mouth 2 (two) times daily.     TYLENOL 500 MG tablet  Generic drug:  acetaminophen  Take 1,000 mg by mouth every 8 (eight) hours as needed for mild pain.         Brief H and P: For complete details please refer to admission H and P, but in brief Amy Hodge is a 58 y.o. female, with past medical history significant for coronary artery disease status post MI in February 2015 and stent placement who presented with a dog bite followed by 5 minutes history of chest pain retrocardiac, pressure-like, radiating to show the left shoulder. It went away on its own. It was similar to the  episode when she had a heart attack. The patient had a complicated pericarditis and was on Motrin and colchicine and just stopped the colchicine a day before. Denies any chest pains at the moment but reports having swelling in her lower extremities in the last few days with many episodes of chest pain. Cardiology saw the patient in ER and decided not to start heparin drip at this time.   Hospital Course:  58 year old female patient with known history of newly diagnosed coronary artery disease. She is post MI sustained in February 2015. During that hospitalization she underwent stent placement and stent placement developed actual myocardial infarction. That hospitalization was also complicated by pericarditis and patient had been discharged on Motrin causes and colchicine.  She presented to the emergency department with 2 complaints: Chest pain and bilateral hand injuries related to dog bite. Patient apparently had been attacked by a neighbors pulled off on attempting to keep that dog from biting her dog. She was bitten on both hands quite significantly. After the attack she began having significant chest discomfort similar to her previous MI symptoms. Because she initially presented with cardiac symptoms cardiology team evaluated the patient in the ER and opted not to start heparin drip since she had no ischemic changes on her EKG and her troponins were only mildly elevated and this could be explained by her recent pericarditis. X-rays were performed of her hands which showed no bony injuries and only soft tissue injuries. She was empirically started on Unasyn and admitted  CAD/Chest pain/History of pericarditis : Chest pain has resolved . Patient was admitted to step down unit. Echocardiogram showed no evidence of pericardial effusion; preserved LV function and NO wall motion abnormalities. TNI peak at 1.50 with current trend down. Cardiology was consulted and recommended to continue aspirin, Plavix,  carvedilol and hydrochlorothiazide for known CAD. Continue colchicine for recent pericarditis- dc at discretion of her primary cardiologist at [facility]. Since has been on colchicine and higher dose NSAIDs monitor for signs of GI etiology for CP, hence continue PPI  Bite from dog/Soft tissue injury of multiple sites/bilateral hands -Endorses significant discomfort primarily in left hand/left index finger patient was placed on Unasyn. Hand surgery orthopedics was consulted and patient underwent I and D on 3/13-cont IV antibiotics per Dr Amedeo Plenty. Per orthopedics, cleared for after dressing change, Augmentin 875 mg twice  a day x2 weeks with probiotic, added florastor.   HYPOTHYROIDISM  -Continue Synthroid  HYPERLIPIDEMIA  -Continue Zocor  COPD  -Compensated  - f/u for acute bacterial bronchitis  -Continue fluticasone   Day of Discharge BP 114/79  Pulse 77  Temp(Src) 98.2 F (36.8 C) (Oral)  Resp 16  Ht 5' 6.5" (1.689 m)  Wt 81.7 kg (180 lb 1.9 oz)  BMI 28.64 kg/m2  SpO2 96%  Physical Exam:  General: Alert and awake, oriented x3, not in any acute distress.  CVS: S1-S2 clear, no murmur rubs or gallops  Chest: clear to auscultation bilaterally, no wheezing, rales or rhonchi  Abdomen: soft nontender, nondistended, normal bowel sounds  Extremities: dressing intact to both hands  Neuro: Cranial nerves II-XII intact, no focal neurological deficits      The results of significant diagnostics from this hospitalization (including imaging, microbiology, ancillary and laboratory) are listed below for reference.    LAB RESULTS: Basic Metabolic Panel:  Recent Labs Lab 05/16/13 0053 05/18/13 0945  NA 141 140  K 3.2* 3.7  CL 103 101  CO2 23 27  GLUCOSE 99 107*  BUN 12 6  CREATININE 0.65 0.62  CALCIUM 8.5 8.9   Liver Function Tests: No results found for this basename: AST, ALT, ALKPHOS, BILITOT, PROT, ALBUMIN,  in the last 168 hours No results found for this basename: LIPASE,  AMYLASE,  in the last 168 hours No results found for this basename: AMMONIA,  in the last 168 hours CBC:  Recent Labs Lab 05/16/13 0053 05/18/13 0945  WBC 8.1 5.4  HGB 12.0 12.0  HCT 34.9* 36.1  MCV 84.1 86.6  PLT 146* 181   Cardiac Enzymes:  Recent Labs Lab 05/15/13 1923 05/16/13 0053 05/16/13 0650  CKTOTAL 132  --   --   CKMB 6.0*  --   --   TROPONINI 1.50* 1.94* 0.93*   BNP: No components found with this basename: POCBNP,  CBG: No results found for this basename: GLUCAP,  in the last 168 hours  Significant Diagnostic Studies:  Dg Chest 2 View  05/15/2013   CLINICAL DATA Animal bite.  Chest pain.  EXAM CHEST  2 VIEW  COMPARISON DG CHEST 2 VIEW dated 01/10/2008  FINDINGS Cardiopericardial silhouette within normal limits. Mediastinal contours normal. Trachea midline. No airspace disease or effusion. Basilar atelectasis. Left mid lung atelectasis or scarring. No displaced rib fractures or pneumothorax. Surgical clips in the thoracic inlet. ACDF.  IMPRESSION Basilar atelectasis.  No acute cardiopulmonary disease.  SIGNATURE  Electronically Signed   By: Dereck Ligas M.D.   On: 05/15/2013 19:04   Dg Hand Complete Left  05/15/2013   CLINICAL DATA Dog bite.  EXAM LEFT HAND - COMPLETE 3+ VIEW  COMPARISON None.  FINDINGS Anatomic alignment. No fracture. Basal joint of the thumb osteoarthritis. No radiopaque foreign body. Skin irregularity is present over the dorsum of the hand, which may be posttraumatic.  IMPRESSION No acute osseous abnormality.  SIGNATURE  Electronically Signed   By: Dereck Ligas M.D.   On: 05/15/2013 19:05   Dg Hand Complete Right  05/15/2013   CLINICAL DATA Dog bite.  EXAM RIGHT HAND - COMPLETE 3+ VIEW  COMPARISON None.  FINDINGS Ulnar deviation of the small finger is probably positional. There is no fracture identified. Soft tissue swelling of the ulnar side of the hand is present with edema in the soft tissues. No fracture or radiopaque foreign body  identified.  IMPRESSION No acute osseous abnormality.  SIGNATURE  Electronically Signed   By: Dereck Ligas M.D.   On: 05/15/2013 19:06    2D ECHO: Study Conclusions  - Left ventricle: The cavity size was normal. Wall thickness was increased in a pattern of mild LVH. Systolic function was normal. The estimated ejection fraction was in the range of 60% to 65%. Wall motion was normal; there were no regional wall motion abnormalities. Doppler parameters are consistent with abnormal left ventricular relaxation (grade 1 diastolic dysfunction). - Right ventricle: The cavity size was mildly dilated.    Disposition and Follow-up:     Discharge Orders   Future Orders Complete By Expires   Diet - low sodium heart healthy  As directed    Discharge instructions  As directed    Comments:     Wound care:   Increase activity slowly  As directed        DISPOSITION: Home DIET: Heart healthy  TESTS THAT NEED FOLLOW-UP None  DISCHARGE FOLLOW-UP Follow-up Information   Follow up with Paulene Floor, MD. Schedule an appointment as soon as possible for a visit in 2 weeks. (for hospital follow-up)    Specialty:  Orthopedic Surgery   Contact information:   [address] [place] [address] [phone]       Follow up with Cathlean Cower, MD. Schedule an appointment as soon as possible for a visit in 2 weeks. (for hospital follow-up)    Specialties:  Internal Medicine, Radiology   Contact information:   [place] [address] [phone]       Time spent on Discharge: 45 minutes  Signed:   Amedee Cerrone M.D. [facility] 05/20/2013, 8:06 AM Pager: [phone]

## 2013-05-20 NOTE — Progress Notes (Signed)
Patient being provided with discharge instructions and follow up information. She will start first dose of PO antibiotics at discharge. She is going back to [facility], her place of residence, with her husband at this time.

## 2013-05-20 NOTE — Progress Notes (Signed)
Dr. Amedeo Plenty ordered Hospitalists to write a prescription for Augmentin before discharge for patient.

## 2013-05-21 ENCOUNTER — Encounter (HOSPITAL_COMMUNITY): Payer: Self-pay | Admitting: Orthopedic Surgery

## 2014-03-04 ENCOUNTER — Encounter (HOSPITAL_COMMUNITY)
Admission: RE | Disposition: A | Discharge status: Home / Self Care | Payer: Self-pay | Source: Ambulatory Visit | Attending: Cardiology

## 2014-03-04 ENCOUNTER — Ambulatory Visit (HOSPITAL_COMMUNITY)
Admission: RE | Admit: 2014-03-04 | Discharge: 2014-03-04 | Disposition: A | Discharge status: Home / Self Care | Payer: Medicare PPO | Source: Ambulatory Visit | Attending: Cardiology | Admitting: Cardiology

## 2014-03-04 ENCOUNTER — Encounter (HOSPITAL_COMMUNITY): Payer: Self-pay | Admitting: Cardiology

## 2014-03-04 DIAGNOSIS — I252 Old myocardial infarction: Secondary | ICD-10-CM | POA: Insufficient documentation

## 2014-03-04 DIAGNOSIS — I10 Essential (primary) hypertension: Secondary | ICD-10-CM | POA: Insufficient documentation

## 2014-03-04 DIAGNOSIS — I2510 Atherosclerotic heart disease of native coronary artery without angina pectoris: Secondary | ICD-10-CM

## 2014-03-04 DIAGNOSIS — Z955 Presence of coronary angioplasty implant and graft: Secondary | ICD-10-CM | POA: Diagnosis not present

## 2014-03-04 DIAGNOSIS — J449 Chronic obstructive pulmonary disease, unspecified: Secondary | ICD-10-CM | POA: Diagnosis not present

## 2014-03-04 DIAGNOSIS — R079 Chest pain, unspecified: Secondary | ICD-10-CM | POA: Diagnosis present

## 2014-03-04 HISTORY — PX: LEFT HEART CATHETERIZATION WITH CORONARY ANGIOGRAM: SHX5451

## 2014-03-04 SURGERY — LEFT HEART CATHETERIZATION WITH CORONARY ANGIOGRAM
Anesthesia: LOCAL

## 2014-03-04 MED ORDER — MIDAZOLAM HCL 2 MG/2ML IJ SOLN
INTRAMUSCULAR | Status: AC
  Filled 2014-03-04: qty 2

## 2014-03-04 MED ORDER — HYDROMORPHONE HCL 1 MG/ML IJ SOLN
INTRAMUSCULAR | Status: AC
  Filled 2014-03-04: qty 1

## 2014-03-04 MED ORDER — HYDROMORPHONE HCL 1 MG/ML IJ SOLN
0.5000 mg | Freq: Once | INTRAMUSCULAR | Status: AC
  Administered 2014-03-04: 0.5 mg via INTRAVENOUS

## 2014-03-04 MED ORDER — VERAPAMIL HCL 2.5 MG/ML IV SOLN
INTRAVENOUS | Status: AC
  Filled 2014-03-04: qty 2

## 2014-03-04 MED ORDER — SODIUM CHLORIDE 0.9 % IJ SOLN: 3.0000 mL | INTRAMUSCULAR | Status: DC | PRN

## 2014-03-04 MED ORDER — SODIUM CHLORIDE 0.9 % IV SOLN: 1.0000 mL/kg/h | INTRAVENOUS | Status: DC

## 2014-03-04 MED ORDER — ASPIRIN 81 MG PO CHEW
CHEWABLE_TABLET | ORAL | Status: AC
  Filled 2014-03-04: qty 1

## 2014-03-04 MED ORDER — SODIUM CHLORIDE 0.9 % IV BOLUS (SEPSIS)
500.0000 mL | Freq: Once | INTRAVENOUS | Status: AC
Start: 2014-03-04 — End: 2014-03-04
  Administered 2014-03-04: 09:00:00 via INTRAVENOUS

## 2014-03-04 MED ORDER — SODIUM CHLORIDE 0.9 % IV SOLN
INTRAVENOUS | Status: DC
  Administered 2014-03-04: 500 mL via INTRAVENOUS

## 2014-03-04 MED ORDER — HEPARIN (PORCINE) IN NACL 2-0.9 UNIT/ML-% IJ SOLN
INTRAMUSCULAR | Status: AC
  Filled 2014-03-04: qty 1000

## 2014-03-04 MED ORDER — NITROGLYCERIN 1 MG/10 ML FOR IR/CATH LAB
INTRA_ARTERIAL | Status: AC
  Filled 2014-03-04: qty 10

## 2014-03-04 MED ORDER — LIDOCAINE HCL (PF) 1 % IJ SOLN
INTRAMUSCULAR | Status: AC
  Filled 2014-03-04: qty 30

## 2014-03-04 MED ORDER — SODIUM CHLORIDE 0.9 % IV SOLN: 250.0000 mL | INTRAVENOUS | Status: DC | PRN

## 2014-03-04 MED ORDER — ASPIRIN 81 MG PO CHEW
81.0000 mg | CHEWABLE_TABLET | ORAL | Status: AC
  Administered 2014-03-04: 81 mg via ORAL

## 2014-03-04 MED ORDER — SODIUM CHLORIDE 0.9 % IJ SOLN: 3.0000 mL | Freq: Two times a day (BID) | INTRAMUSCULAR | Status: DC

## 2014-03-04 MED ORDER — SODIUM CHLORIDE 0.9 % IV SOLN: INTRAVENOUS | Status: DC

## 2014-03-04 MED ORDER — HEPARIN SODIUM (PORCINE) 1000 UNIT/ML IJ SOLN
INTRAMUSCULAR | Status: AC
  Filled 2014-03-04: qty 1

## 2014-03-04 NOTE — H&P (Signed)
  Please see office visit notes for complete details of HPI.  

## 2014-03-04 NOTE — Interval H&P Note (Signed)
History and Physical Interval Note:  03/04/2014 11:30 AM  Amy Hodge  has presented today for surgery, with the diagnosis of cp  The various methods of treatment have been discussed with the patient and family. After consideration of risks, benefits and other options for treatment, the patient has consented to  Procedure(s): LEFT HEART CATHETERIZATION WITH CORONARY ANGIOGRAM (N/A) and possible PCI as a surgical intervention .  The patient's history has been reviewed, patient examined, no change in status, stable for surgery.  I have reviewed the patient's chart and labs.  Questions were answered to the patient's satisfaction.   Cath Lab Visit (complete for each Cath Lab visit)  Clinical Evaluation Leading to the Procedure:   ACS: No.  Non-ACS:    Anginal Classification: CCS III  Anti-ischemic medical therapy: Maximal Therapy (2 or more classes of medications)  Non-Invasive Test Results: No non-invasive testing performed  Prior CABG: No previous CABG   Patient Information: Ischemic Symptoms? CCS III (Marked limitation of ordinary activity)  Anti-ischemic Medical Therapy? Maximal Medical Therapy (2 or more classes of medications)  Non-invasive Test Results? No non-invasive testing performed  Prior CABG? No Previous CABG  Patient Information:  1-2V CAD, no prox LAD A (7) Indication: 20; Score 7 1180 Patient Information:  1-2V-CAD with DS 50-60%  With No FFR, No IVUS I (3) Indication: 21; Score 3 1183 Patient Information:  1-2V-CAD with DS 50-60%  With FFR<=0.8, and/or IVUS signficant reduction in CSA A (7) Indication: 22; Score 7 1184 Patient Information:  1-2V-CAD with DS 50-60%  With FFR>0.8, IVUS not significant I (2) Indication: 23; Score 2 1185 Patient Information:  3V-CAD without LMCA  With Abnormal LV systolic function A (9) Indication: 48; Score 9 1186 Patient Information:  LMCA-CAD A (9) Indication: 49; Score 9 1191 Patient  Information:  2V-CAD with prox LAD  PCI A (7) Indication: 62; Score 7 1182 Patient Information:  2V-CAD with prox LAD  CABG A (8) Indication: 62; Score 8 1181 Patient Information:  3V-CAD without LMCA  With Low CAD burden(i.e., 3 focal stenoses, low SYNTAX score)  PCI A (7) Indication: 63; Score 7 1188 Patient Information:  3V-CAD without LMCA  With Low CAD burden(i.e., 3 focal stenoses, low SYNTAX score)  CABG A (9) Indication: 63; Score 9 1187 Patient Information:  3V-CAD without LMCA  E06c - Intermediate-high CAD burden (i.e., multiple diffuse lesions, presence of CTO, or high SYNTAX score)  PCI U (4) Indication: 64; Score 4 1190 Patient Information:  3V-CAD without LMCA  E06c - Intermediate-high CAD burden (i.e., multiple diffuse lesions, presence of CTO, or high SYNTAX score)  CABG A (9) Indication: 64; Score 9 1189 Patient Information:  LMCA-CAD  With Isolated LMCA stenosis  PCI U (6) Indication: 65; Score 6 1193 Patient Information:  LMCA-CAD  With Isolated LMCA stenosis  CABG A (9) Indication: 65; Score 9 1192 Patient Information:  LMCA-CAD  Additional CAD, low CAD burden (i.e., 1- to 2-vessel additional involvement, low SYNTAX score)  PCI U (5) Indication: 66; Score 5 1195 Patient Information:  LMCA-CAD  Additional CAD, low CAD burden (i.e., 1- to 2-vessel additional involvement, low SYNTAX score)  CABG A (9) Indication: 66; Score 9 1194 Patient Information:  LMCA-CAD  Additional CAD, intermediate-high CAD burden (i.e., 3-vessel involvement, presence of CTO, or high SYNTAX score)  PCI I (3) Indication: 67; Score 3 1197 Patient Information:  LMCA-CAD  Additional CAD, intermediate-high CAD burden (i.e., 3-vessel involvement, presence of CTO, or high SYNTAX score)  CABG A (9) Indication: 67; Score 9         Maysie Parkhill,JAGADEESH R

## 2014-03-04 NOTE — Discharge Instructions (Signed)
Radial Site Care °Refer to this sheet in the next few weeks. These instructions provide you with information on caring for yourself after your procedure. Your caregiver may also give you more specific instructions. Your treatment has been planned according to current medical practices, but problems sometimes occur. Call your caregiver if you have any problems or questions after your procedure. °HOME CARE INSTRUCTIONS °· You may shower the day after the procedure. Remove the bandage (dressing) and gently wash the site with plain soap and water. Gently pat the site dry. °· Do not apply powder or lotion to the site. °· Do not submerge the affected site in water for 3 to 5 days. °· Inspect the site at least twice daily. °· Do not flex or bend the affected arm for 24 hours. °· No lifting over 5 pounds (2.3 kg) for 5 days after your procedure. °· Do not drive home if you are discharged the same day of the procedure. Have someone else drive you. °· You may drive 24 hours after the procedure unless otherwise instructed by your caregiver. °· Do not operate machinery or power tools for 24 hours. °· A responsible adult should be with you for the first 24 hours after you arrive home. °What to expect: °· Any bruising will usually fade within 1 to 2 weeks. °· Blood that collects in the tissue (hematoma) may be painful to the touch. It should usually decrease in size and tenderness within 1 to 2 weeks. °SEEK IMMEDIATE MEDICAL CARE IF: °· You have unusual pain at the radial site. °· You have redness, warmth, swelling, or pain at the radial site. °· You have drainage (other than a small amount of blood on the dressing). °· You have chills. °· You have a fever or persistent symptoms for more than 72 hours. °· You have a fever and your symptoms suddenly get worse. °· Your arm becomes pale, cool, tingly, or numb. °· You have heavy bleeding from the site. Hold pressure on the site. °Document Released: 03/26/2010 Document Revised:  05/16/2011 Document Reviewed: 03/26/2010 °ExitCare® Patient Information ©2015 [employer]. This information is not intended to replace advice given to you by your health care provider. Make sure you discuss any questions you have with your health care provider. ° °

## 2014-03-04 NOTE — CV Procedure (Signed)
Procedure performed:  Left heart catheterization including hemodynamic monitoring of the left ventricle, LV gram, selective right and left coronary arteriography.  Indication patient is a 58 year-old Caucasian  female with history of hypertension,  hyperlipidemia, Severe COPD from prior tobacco use history of non-ST elevation myocardial infarctions that occurred about 6-8 months ago in [place], she had undergone complex coronary intervention to a very large superdominant right coronary artery with implantation of drug-eluting stents from the mid to distal segment. She had bifurcation stent placed in the PDA and after the stent was crossed after implantation of a distal RCA stent, there was loss of PDA branch. Since then patient has been having severe class III angina pectoris. She is on aggressive medical therapy, in spite of this, patient has symptoms of class III angina pectoris which is lifestyle limiting. I had tried to obtain her prior angiography report and also angiogram, I have been unsuccessful although I did receive angiographic notes. Due to ongoing symptoms, patient is scheduled for coronary angiography and possible angioplasty.  Hemodynamic data:  Left ventricular pressure was 109/-3 with LVEDP of 8 mm mercury. Aortic pressure was 113/71 with a mean of 86 mm mercury. There was no pressure gradient across the aortic valve  Left ventricle: Performed in the LAO/RAO projection revealed LVEF of 55-60 %. There was no significant MR. no wall motion abnormality.  Right coronary artery:  Super Dominant. It gives origin to large PDA and large PL branch. The previously placed stents in the mid, cracks, distal RCA and into the PL branch of the RCA are widely patent. The PDA stent is visible, it is occluded. There is faint filling of the PDA antegradely. The PDA gets contralateral collaterals from the LAD and the diagonal branches. The PDA appears to be very large but probably diseased  diffusely.  Left main coronary artery is large and normal.  Circumflex coronary artery:  It is a moderate caliber vessel, has mild luminal diffuse irregularity.   LAD:  LAD gives origin to a large diagonal-1, D2. Both are large vessels, D2 has secondary branches and supplies a large anterolateral territory. The LAD itself and before reaching the apex, the apex is supplied by the PDA. There is mild diffuse luminal irregularity.  Technique: Under sterile precautions using a 6 French right radial  arterial access, a 6 French sheath was introduced into the right radial artery. A 5 [place] Tig 4 catheter was advanced into the ascending aorta selective  left coronary artery was cannulated and angiography was performed in multiple views. The catheter was pulled back Out of the body over exchange length J-wire. A 5 French Judkins right 4 diagnostic catheter was utilized to engage the right coronary artery and angiography was performed. Tiger four Catheter was used to perform LV gram which was performed in LAO/RAO projection.  Catheter exchanged out of the body over J-Wire. NO immediate complications noted. Patient tolerated the procedure well.   Rec: I have reviewed the angiogram, patient has had extremely complex procedure in the past that took several hours for placement of the stents. Given this, I decided to take the patient off the table, I will discuss possible revascularization options, potentially reattempt angioplasty to the PDA. She is already on aggressive medical therapy. I will review the angiograms with the patient and her husband again and make a final determination whether she needs repeat angiography with an eye towards revascularization. Other option would be contralateral attempt at revascularization of the PDA branch via septal perforator  branches of the LAD. Patient also the procedure well. A total of 90 mL of contrast was utilized for diagnostic angiography.

## 2014-03-13 DIAGNOSIS — E890 Postprocedural hypothyroidism: Secondary | ICD-10-CM | POA: Diagnosis not present

## 2014-03-13 DIAGNOSIS — J060 Acute laryngopharyngitis: Secondary | ICD-10-CM | POA: Diagnosis not present

## 2014-03-13 DIAGNOSIS — I2510 Atherosclerotic heart disease of native coronary artery without angina pectoris: Secondary | ICD-10-CM | POA: Diagnosis not present

## 2014-03-13 DIAGNOSIS — I252 Old myocardial infarction: Secondary | ICD-10-CM | POA: Diagnosis not present

## 2014-03-13 DIAGNOSIS — J431 Panlobular emphysema: Secondary | ICD-10-CM | POA: Diagnosis not present

## 2014-03-20 DIAGNOSIS — R0683 Snoring: Secondary | ICD-10-CM | POA: Diagnosis not present

## 2014-03-28 ENCOUNTER — Ambulatory Visit: Payer: Medicare PPO | Admitting: Family

## 2014-04-03 ENCOUNTER — Institutional Professional Consult (permissible substitution): Payer: Self-pay | Admitting: Neurology

## 2014-04-23 ENCOUNTER — Telehealth: Payer: Self-pay | Admitting: Internal Medicine

## 2014-04-23 ENCOUNTER — Encounter: Payer: Self-pay | Admitting: Internal Medicine

## 2014-04-23 ENCOUNTER — Other Ambulatory Visit: Payer: Commercial Managed Care - HMO

## 2014-04-23 ENCOUNTER — Ambulatory Visit (INDEPENDENT_AMBULATORY_CARE_PROVIDER_SITE_OTHER)
Admission: RE | Admit: 2014-04-23 | Discharge: 2014-04-23 | Disposition: A | Discharge status: Home / Self Care | Payer: Commercial Managed Care - HMO | Source: Ambulatory Visit | Attending: Internal Medicine | Admitting: Internal Medicine

## 2014-04-23 ENCOUNTER — Ambulatory Visit (INDEPENDENT_AMBULATORY_CARE_PROVIDER_SITE_OTHER): Payer: Commercial Managed Care - HMO | Admitting: Internal Medicine

## 2014-04-23 VITALS — BP 124/70 | HR 66 | Ht 67.0 in | Wt 182.4 lb

## 2014-04-23 DIAGNOSIS — J439 Emphysema, unspecified: Secondary | ICD-10-CM

## 2014-04-23 DIAGNOSIS — J449 Chronic obstructive pulmonary disease, unspecified: Secondary | ICD-10-CM | POA: Diagnosis not present

## 2014-04-23 DIAGNOSIS — Z720 Tobacco use: Secondary | ICD-10-CM | POA: Diagnosis not present

## 2014-04-23 NOTE — Progress Notes (Signed)
04/23/14- 58 yoF referred courtesy of: Dr Einar Gip sent patient but PCP is Ramachandran; COPD Husband here She had smoked a pack a day, quitting 18 years ago. Diagnosed with COPD in 2000 at my old office. She says she was diagnosed at age 59 with bronchiectasis and "they blocked the lower left lung".. I'm not sure what kind of treatment she is referring to. Daily cough productive clear sputum or sometimes trace red while taking Plavix and aspirin dyspnea on exertion with hills and stairs. Cough is disturbing sleep at night. Lisinopril had caused cough and was changed. Albuterol rescue inhaler used about twice a week. Triggers include spring pollens, cold winter air and hot humid summer air. 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 CXR 05/05/13 FINDINGS Cardiopericardial silhouette within normal limits. Mediastinal contours normal. Trachea midline. No airspace disease or effusion. Basilar atelectasis. Left mid lung atelectasis or scarring. No displaced rib fractures or pneumothorax. Surgical clips in the thoracic inlet.  IMPRESSION Basilar atelectasis. No acute cardiopulmonary disease. SIGNATURE Electronically Signed  By: Dereck Ligas M.D.  On: 05/15/2013 19:04 ECHO 05/16/13 EF 60-65%, GR1 diast dysfunction  Prior to Admission medications   Medication Sig Start Date End Date Taking? Authorizing Provider  acetaminophen (TYLENOL) 500 MG tablet Take 1,000 mg by mouth every 8 (eight) hours as needed for mild pain.    Yes Historical Provider, MD  albuterol (PROVENTIL HFA;VENTOLIN HFA) 108 (90 BASE) MCG/ACT inhaler Inhale 2 puffs into the lungs every 6 (six) hours as needed for wheezing or shortness of breath.   Yes Historical Provider, MD  amLODipine (NORVASC) 2.5 MG tablet Take 2.5 mg by mouth daily.   Yes Historical Provider, MD  aspirin EC 81 MG tablet Take 81 mg by mouth  daily.   Yes Historical Provider, MD  clonazePAM (KLONOPIN) 1 MG tablet Take 1 tablet (1 mg total) by mouth at bedtime as needed for anxiety. Patient taking differently: Take 1 mg by mouth 2 (two) times daily as needed for anxiety.  05/20/13  Yes Ripudeep Krystal Eaton, MD  clopidogrel (PLAVIX) 75 MG tablet Take 75 mg by mouth daily with breakfast.   Yes Historical Provider, MD  DULoxetine (CYMBALTA) 60 MG capsule Take 1 capsule (60 mg total) by mouth daily. 08/11/11  Yes Biagio Borg, MD  ibuprofen (ADVIL,MOTRIN) 800 MG tablet Take 800 mg by mouth every 8 (eight) hours as needed (pain).    Yes Historical Provider, MD  levothyroxine (SYNTHROID) 175 MCG tablet Take 1 tablet (175 mcg total) by mouth daily. 12/31/10  Yes Biagio Borg, MD  metoprolol tartrate (LOPRESSOR) 25 MG tablet Take 25 mg by mouth 2 (two) times daily.   Yes Historical Provider, MD  omeprazole (PRILOSEC) 20 MG capsule Take 1 capsule (20 mg total) by mouth daily. 12/31/10  Yes Biagio Borg, MD  ranolazine (RANEXA) 1000 MG SR tablet Take 1,000 mg by mouth 2 (two) times daily.    Yes Historical Provider, MD  rosuvastatin (CRESTOR) 20 MG tablet Take 20 mg by mouth daily.   Yes Historical Provider, MD  spironolactone (ALDACTONE) 25 MG tablet Take 25 mg by mouth daily.   Yes Historical Provider, MD   Past Medical History  Diagnosis Date  . GERD (gastroesophageal reflux disease)   . Anxiety   . Depression   . Hypothyroidism   . Headache(784.0)   . Allergy   . Hyperlipidemia   . COPD (  chronic obstructive pulmonary disease)   . DJD (degenerative joint disease) of knee     RT  . DDD (degenerative disc disease), cervical   . COPD exacerbation 12/31/2010   Past Surgical History  Procedure Laterality Date  . Tubal ligation  1984  . Appendectomy  1971  . Thyroidectomy  1996  . Tonsillectomy  1965  . Abdominal hysterectomy    . Oophorectomy    . Total abdominal hysterectomy w/ bilateral salpingoophorectomy  01/2008    with cervical  dysplasia  . Shoulder surgery      RT  . I&d extremity Bilateral 05/17/2013    Procedure: IRRIGATION AND DEBRIDEMENT EXTREMITY;  Surgeon: Roseanne Kaufman, MD;  Location: [facility];  Service: Orthopedics;  Laterality: Bilateral;  . Left heart catheterization with coronary angiogram N/A 03/04/2014    Procedure: LEFT HEART CATHETERIZATION WITH CORONARY ANGIOGRAM;  Surgeon: Laverda Page, MD;  Location: [facility] CATH LAB;  Service: Cardiovascular;  Laterality: N/A;   Family History  Problem Relation Age of Onset  . Diabetes Father   . Hypertension Father   . Heart disease Father    History   Social History  . Marital Status: Married    Spouse Name: N/A  . Number of Children: 1  . Years of Education: N/A   Occupational History  . disabled    Social History Main Topics  . Smoking status: Former Smoker -- 2.00 packs/day for 25 years    Types: Cigarettes    Quit date: 04/23/1996  . Smokeless tobacco: Not on file  . Alcohol Use: No  . Drug Use: No  . Sexual Activity: Not on file   Other Topics Concern  . Not on file   Social History Narrative   On worker's comp for lower back and right shoulder - applying for disability; losing [employer] insurance 03/07/2010   ROS-see HPI Constitutional:   No-   weight loss, night sweats, fevers, chills, fatigue, lassitude. HEENT:   + headaches, difficulty swallowing, tooth/dental problems, sore throat,       No-  sneezing, itching, ear ache, +nasal congestion, post nasal drip,  CV:  +chest pain, orthopnea, PND, swelling in lower extremities, anasarca,                                  dizziness, +palpitations Resp: +shortness of breath with exertion or at rest.              +  productive cough,  No non-productive cough,  No- coughing up of blood.              +  change in color of mucus.  No- wheezing.   Skin: No-   rash or lesions. GI:  + heartburn, indigestion, abdominal pain, nausea, vomiting, diarrhea,                 change in bowel habits, loss of  appetite GU: No-   dysuria, change in color of urine, no urgency or frequency.  No- flank pain. MS:  +  joint pain or swelling.  No- decreased range of motion.  No- back pain. Neuro-     nothing unusual Psych:  No- change in mood or affect. No depression or anxiety.  No memory loss.  OBJ- Physical Exam General- Alert, Oriented, Affect-appropriate, Distress- none acute Skin- rash-none, lesions- none, excoriation- none Lymphadenopathy- none Head- atraumatic  Eyes- Gross vision intact, PERRLA, conjunctivae and secretions clear            Ears- Hearing, canals-normal            Nose- Clear, no-Septal dev, mucus, polyps, erosion, perforation             Throat- Mallampati II , mucosa clear , drainage- none, tonsils- atrophic,                    + dentures Neck- flexible , trachea midline, no stridor , thyroid nl, carotid no bruit Chest - symmetrical excursion , unlabored           Heart/CV- RRR , no murmur , no gallop  , no rub, nl s1 s2                           - JVD- none , edema- none, stasis changes- none, varices- none           Lung- clear to P&A, wheeze- none, cough- none , dullness-none, rub- none           Chest wall-  Abd- tender-no, distended-no, bowel sounds-present, HSM- no Br/ Gen/ Rectal- Not done, not indicated Extrem- cyanosis- none, clubbing, none, atrophy- none, strength- nl Neuro- grossly intact to observation

## 2014-04-23 NOTE — Progress Notes (Signed)
Quick Note:  Pt called back. Informed pt of the results per CY. Pt verbalized understanding and denied any further questions or concerns at this time. ______

## 2014-04-23 NOTE — Telephone Encounter (Signed)
Pt returned call from result note. Informed pt of the results per CY. Pt verbalized understanding and denied any further questions or concerns at this time.    Notes Recorded by Deneise Lever, MD on 04/23/2014 at 3:52 PM CXR- some chronic bronchitis change and scarring. No definite active process.

## 2014-04-23 NOTE — Assessment & Plan Note (Signed)
She is not acutely ill at this visit so this is the opportunity to establish baseline markers for her lung disease. Plan-schedule PFT with 6 minute walk test, overnight oximetry on room air, chest x-ray. Labs were alpha-1 antitrypsin assay.

## 2014-04-23 NOTE — Progress Notes (Signed)
Quick Note:  lmtcb X1 ______ 

## 2014-04-23 NOTE — Patient Instructions (Signed)
Order- schedule PFT, 6MWT ONOX on room air    Dx COPD with emphysema  Order CXR  Order- lab- a1AT assay

## 2014-04-30 DIAGNOSIS — J439 Emphysema, unspecified: Secondary | ICD-10-CM | POA: Diagnosis not present

## 2014-05-01 LAB — ALPHA-1 ANTITRYPSIN PHENOTYPE: A1 ANTITRYPSIN: 138 mg/dL (ref 83–199)

## 2014-05-08 DIAGNOSIS — J060 Acute laryngopharyngitis: Secondary | ICD-10-CM | POA: Diagnosis not present

## 2014-05-08 DIAGNOSIS — Z0000 Encounter for general adult medical examination without abnormal findings: Secondary | ICD-10-CM | POA: Diagnosis not present

## 2014-05-08 DIAGNOSIS — I2510 Atherosclerotic heart disease of native coronary artery without angina pectoris: Secondary | ICD-10-CM | POA: Diagnosis not present

## 2014-05-15 DIAGNOSIS — Z0000 Encounter for general adult medical examination without abnormal findings: Secondary | ICD-10-CM | POA: Diagnosis not present

## 2014-05-15 DIAGNOSIS — J449 Chronic obstructive pulmonary disease, unspecified: Secondary | ICD-10-CM | POA: Diagnosis not present

## 2014-05-15 DIAGNOSIS — I2510 Atherosclerotic heart disease of native coronary artery without angina pectoris: Secondary | ICD-10-CM | POA: Diagnosis not present

## 2014-05-15 DIAGNOSIS — E782 Mixed hyperlipidemia: Secondary | ICD-10-CM | POA: Diagnosis not present

## 2014-05-15 DIAGNOSIS — E890 Postprocedural hypothyroidism: Secondary | ICD-10-CM | POA: Diagnosis not present

## 2014-05-26 ENCOUNTER — Encounter: Payer: Self-pay | Admitting: Internal Medicine

## 2014-05-28 DIAGNOSIS — H524 Presbyopia: Secondary | ICD-10-CM | POA: Diagnosis not present

## 2014-05-28 DIAGNOSIS — H5210 Myopia, unspecified eye: Secondary | ICD-10-CM | POA: Diagnosis not present

## 2014-06-23 ENCOUNTER — Ambulatory Visit (INDEPENDENT_AMBULATORY_CARE_PROVIDER_SITE_OTHER): Payer: Commercial Managed Care - HMO | Admitting: Internal Medicine

## 2014-06-23 ENCOUNTER — Encounter: Payer: Self-pay | Admitting: Internal Medicine

## 2014-06-23 VITALS — BP 122/72 | HR 77 | Ht 67.0 in | Wt 188.0 lb

## 2014-06-23 DIAGNOSIS — J309 Allergic rhinitis, unspecified: Secondary | ICD-10-CM

## 2014-06-23 DIAGNOSIS — J439 Emphysema, unspecified: Secondary | ICD-10-CM | POA: Diagnosis not present

## 2014-06-23 DIAGNOSIS — J302 Other seasonal allergic rhinitis: Secondary | ICD-10-CM

## 2014-06-23 DIAGNOSIS — J432 Centrilobular emphysema: Secondary | ICD-10-CM

## 2014-06-23 DIAGNOSIS — J3089 Other allergic rhinitis: Secondary | ICD-10-CM

## 2014-06-23 LAB — PULMONARY FUNCTION TEST
DL/VA % PRED: 75 %
DL/VA: 3.87 ml/min/mmHg/L
DLCO UNC: 19.65 ml/min/mmHg
DLCO unc % pred: 69 %
FEF 25-75 POST: 1.1 L/s
FEF 25-75 Pre: 1.29 L/sec
FEF2575-%Change-Post: -14 %
FEF2575-%PRED-PRE: 48 %
FEF2575-%Pred-Post: 41 %
FEV1-%Change-Post: -2 %
FEV1-%PRED-PRE: 66 %
FEV1-%Pred-Post: 65 %
FEV1-POST: 1.92 L
FEV1-PRE: 1.96 L
FEV1FVC-%CHANGE-POST: 0 %
FEV1FVC-%Pred-Pre: 91 %
FEV6-%Change-Post: -2 %
FEV6-%Pred-Post: 71 %
FEV6-%Pred-Pre: 73 %
FEV6-Post: 2.62 L
FEV6-Pre: 2.69 L
FEV6FVC-%Change-Post: 0 %
FEV6FVC-%Pred-Post: 102 %
FEV6FVC-%Pred-Pre: 101 %
FVC-%Change-Post: -2 %
FVC-%PRED-POST: 70 %
FVC-%Pred-Pre: 72 %
FVC-Post: 2.64 L
FVC-Pre: 2.72 L
POST FEV1/FVC RATIO: 72 %
Post FEV6/FVC ratio: 99 %
Pre FEV1/FVC ratio: 72 %
Pre FEV6/FVC Ratio: 99 %
RV % pred: 122 %
RV: 2.53 L
TLC % pred: 98 %
TLC: 5.41 L

## 2014-06-23 MED ORDER — UMECLIDINIUM-VILANTEROL 62.5-25 MCG/INH IN AEPB: 1.0000 | INHALATION_SPRAY | Freq: Every day | RESPIRATORY_TRACT | Status: DC

## 2014-06-23 MED ORDER — UMECLIDINIUM-VILANTEROL 62.5-25 MCG/INH IN AEPB: INHALATION_SPRAY | RESPIRATORY_TRACT | Status: DC

## 2014-06-23 NOTE — Progress Notes (Signed)
PFT done today. 

## 2014-06-23 NOTE — Patient Instructions (Signed)
Order- referral to [facility]   Dx centrilobular emphysema  Order- DME new O2 2L sleep   COPD, emphysema  Sample and Script to try Anoro inhaler   1 puff, once daily  Ok to still use the albuterol rescue inhaler if you feel you need it

## 2014-06-23 NOTE — Progress Notes (Signed)
04/23/14- 59 yoF referred courtesy of: Dr Einar Gip sent patient but PCP is Ramachandran; COPD Husband here She had smoked a pack a day, quitting 18 years ago. Diagnosed with COPD in 2000 at my old office. She says she was diagnosed at age 59 with bronchiectasis and "they blocked the lower left lung".. I'm not sure what kind of treatment she is referring to. Daily cough productive clear sputum or sometimes trace red while taking Plavix and aspirin dyspnea on exertion with hills and stairs. Cough is disturbing sleep at night. Lisinopril had caused cough and was changed. Albuterol rescue inhaler used about twice a week. Triggers include spring pollens, cold winter air and hot humid summer air. 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 CXR 05/05/13 FINDINGS Cardiopericardial silhouette within normal limits. Mediastinal contours normal. Trachea midline. No airspace disease or effusion. Basilar atelectasis. Left mid lung atelectasis or scarring. No displaced rib fractures or pneumothorax. Surgical clips in the thoracic inlet.  IMPRESSION Basilar atelectasis. No acute cardiopulmonary disease. SIGNATURE Electronically Signed  By: Dereck Ligas M.D.  On: 05/15/2013 19:04 ECHO 05/16/13 EF 60-65%, GR1 diast dysfunction  06/23/14- 59 yoF former smoker followed for COPD FOLLOWS FOR: Pt states she is having increased SOB with allergies flaring up; review 6MW, PFT, ONO, xray, and labs with patient. Some seasonal rhinitis. Zyrtec not enough help. Persistent cough and wheeze. Short of breath walking half the distance of her driveway. Stopped to rest before returning. Rescue inhaler not enough help. a1AT WNL MM 138 6MWT-06/23/2014-94%, 93%, 99%, 452 m on room air. No oxygen limitation. PFT-06/23/14- moderate obstructive airways disease with insignificant response to bronchodilator, normal lung  capacity, diffusion mildly reduced. FVC 2.64/70%, FEV1 1.92/65%, FEV1/FVC 0.72 ONOX-04/30/14- 314 minutes with oxygen saturation less than or equal to 88% on room air, qualifying for home O2 during sleep CXR 04/23/14-  IMPRESSION: COPD with right basilar scarring versus subsegmental atelectasis. There is no pneumonia, CHF, nor other acute cardiopulmonary abnormality. Electronically Signed  By: David Martinique  On: 04/23/2014 14:48  ROS-see HPI Constitutional:   No-   weight loss, night sweats, fevers, chills, fatigue, lassitude. HEENT:   + headaches, difficulty swallowing, tooth/dental problems, sore throat,       No-  sneezing, itching, ear ache, +nasal congestion, post nasal drip,  CV:  +chest pain, orthopnea, PND, swelling in lower extremities, anasarca,                                  dizziness, +palpitations Resp: +shortness of breath with exertion or at rest.              +  productive cough,  No non-productive cough,  No- coughing up of blood.              +  change in color of mucus.  No- wheezing.   Skin: No-   rash or lesions. GI:  + heartburn, indigestion, abdominal pain, nausea, vomiting,  GU:  MS:  +  joint pain or swelling.   Neuro-     nothing unusual Psych:  No- change in mood or affect. No depression or anxiety.  No memory loss.  OBJ- Physical Exam General- Alert, Oriented, Affect-appropriate, Distress- none acute Skin- rash-none, lesions- none, excoriation- none Lymphadenopathy- none Head- atraumatic            Eyes- Gross  vision intact, PERRLA, conjunctivae and secretions clear            Ears- Hearing, canals-normal            Nose- Clear, no-Septal dev, mucus, polyps, erosion, perforation             Throat- Mallampati II , mucosa clear , drainage- none, tonsils- atrophic,                    + dentures Neck- flexible , trachea midline, no stridor , thyroid nl, carotid no bruit Chest - symmetrical excursion , unlabored           Heart/CV- RRR , no murmur , no  gallop  , no rub, nl s1 s2                           - JVD- none , edema- none, stasis changes- none, varices- none           Lung- clear to P&A, wheeze- none, cough- none , dullness-none, rub- none           Chest wall-  Abd-  Br/ Gen/ Rectal- Not done, not indicated Extrem- cyanosis- none, clubbing, none, atrophy- none, strength- nl Neuro- grossly intact to observation

## 2014-06-24 DIAGNOSIS — J432 Centrilobular emphysema: Secondary | ICD-10-CM | POA: Insufficient documentation

## 2014-06-24 NOTE — Progress Notes (Signed)
Documentation for 6 minute walk test 06/23/2014-94%, 93%, 99%, 452 m. No oxygen limitation.

## 2014-06-24 NOTE — Assessment & Plan Note (Signed)
>>  ASSESSMENT AND PLAN FOR CENTRILOBULAR EMPHYSEMA (HCC) WRITTEN ON 06/24/2014  8:15 PM BY YOUNG, CLINTON D, MD  Primarily and emphysema pattern with little response to bronchodilator and sleep hypoxemia. If bronchitis component becomes more obvious then this will be better coded as COPD, mixed type. Plan-pulmonary rehabilitation, trial of Anoro inhaler, oxygen 2 L for sleep.

## 2014-06-24 NOTE — Assessment & Plan Note (Addendum)
Primarily and emphysema pattern with little response to bronchodilator and sleep hypoxemia. If bronchitis component becomes more obvious then this will be better coded as COPD, mixed type. Plan-pulmonary rehabilitation, trial of Anoro inhaler, oxygen 2 L for sleep.

## 2014-06-24 NOTE — Assessment & Plan Note (Signed)
Mild seasonal spring pollen rhinitis.  Plan-management with antihistamines and nasal steroid sprays discussed.

## 2014-06-26 DIAGNOSIS — J432 Centrilobular emphysema: Secondary | ICD-10-CM | POA: Diagnosis not present

## 2014-07-02 DIAGNOSIS — J439 Emphysema, unspecified: Secondary | ICD-10-CM | POA: Diagnosis not present

## 2014-07-02 DIAGNOSIS — J449 Chronic obstructive pulmonary disease, unspecified: Secondary | ICD-10-CM | POA: Diagnosis not present

## 2014-07-03 ENCOUNTER — Telehealth (HOSPITAL_COMMUNITY): Payer: Self-pay

## 2014-07-03 NOTE — Telephone Encounter (Signed)
Called patient regarding entrance to Pulmonary Rehab.  Patient states that they are interested in attending the program.  Amy Hodge is going to verify insurance coverage and follow up.

## 2014-07-03 NOTE — Telephone Encounter (Signed)
Patient is responsible for a $40 copay for Pulmonary Rehab.  This is not feasible for the patient.  Patient was offered our maintenance program which is $68 a month.  Patient needs to discuss with husband and wants [name] to follow up with her on Monday.

## 2014-07-07 ENCOUNTER — Telehealth: Payer: Self-pay | Admitting: Internal Medicine

## 2014-07-07 DIAGNOSIS — I25119 Atherosclerotic heart disease of native coronary artery with unspecified angina pectoris: Secondary | ICD-10-CM | POA: Diagnosis not present

## 2014-07-07 DIAGNOSIS — I209 Angina pectoris, unspecified: Secondary | ICD-10-CM | POA: Diagnosis not present

## 2014-07-07 DIAGNOSIS — K219 Gastro-esophageal reflux disease without esophagitis: Secondary | ICD-10-CM | POA: Diagnosis not present

## 2014-07-07 DIAGNOSIS — M791 Myalgia: Secondary | ICD-10-CM | POA: Diagnosis not present

## 2014-07-08 NOTE — Telephone Encounter (Signed)
Pt returned Katie's call - [phone]

## 2014-07-08 NOTE — Telephone Encounter (Signed)
Spoke with pt and advised of ONO results.  PT states that [employer] delivered oxygen last week and has already started using it.  Nothing further needed.

## 2014-07-10 DIAGNOSIS — R21 Rash and other nonspecific skin eruption: Secondary | ICD-10-CM | POA: Diagnosis not present

## 2014-07-25 ENCOUNTER — Telehealth (HOSPITAL_COMMUNITY): Payer: Self-pay

## 2014-07-25 NOTE — Telephone Encounter (Signed)
Called patient to discuss Pulmonary Rehab Maintenance.  Patient states that finances are tight right now and if her situation ever changes in the future she will contact us.

## 2014-08-01 DIAGNOSIS — J449 Chronic obstructive pulmonary disease, unspecified: Secondary | ICD-10-CM | POA: Diagnosis not present

## 2014-08-01 DIAGNOSIS — J439 Emphysema, unspecified: Secondary | ICD-10-CM | POA: Diagnosis not present

## 2014-08-06 ENCOUNTER — Encounter: Payer: Self-pay | Admitting: Internal Medicine

## 2014-08-18 DIAGNOSIS — I25119 Atherosclerotic heart disease of native coronary artery with unspecified angina pectoris: Secondary | ICD-10-CM | POA: Diagnosis not present

## 2014-08-18 DIAGNOSIS — R5381 Other malaise: Secondary | ICD-10-CM | POA: Diagnosis not present

## 2014-08-18 DIAGNOSIS — E781 Pure hyperglyceridemia: Secondary | ICD-10-CM | POA: Diagnosis not present

## 2014-08-18 DIAGNOSIS — R5383 Other fatigue: Secondary | ICD-10-CM | POA: Diagnosis not present

## 2014-08-26 DIAGNOSIS — I25119 Atherosclerotic heart disease of native coronary artery with unspecified angina pectoris: Secondary | ICD-10-CM | POA: Diagnosis not present

## 2014-08-31 NOTE — H&P (Signed)
OFFICE VISIT NOTES COPIED TO EPIC FOR DOCUMENTATION  Amy Hodge 08/18/2014 2:44 PM Location: [facility] Patient #: [id number] DOB: 11/03/1955 Married / Language: English / Race: White Female    History of Present Illness(Jagadeesh Carlynn Herald, MD; 08/18/2014 5:16 PM) The patient is a 59 year old female who presents for a follow-up for CAD. Amy Hodge is a 59 year old Caucasian female with a history of known CAD, hyperlipidemia, hypertension, & COPD. H/O NSTEMI in March 2015 after being chased by a dog. She has had very complex coronary angioplasty to the right coronary artery, taking 3-4 hours to place stents into the distal and mid segment of the right coronary artery. This was done in [place]. During the process, she has lost PDA after it was stented and main brach stenting due to side-branch occlusion.  Patient presents to me for follow-up of angina pectoris and coronary artery disease. She now presents here for follow-up of angina pectoris. Patient now states that her life is miserable, she is unable to do urine also chores without having chest discomfort, also has nocturnal angina. I had seen her 6 weeks ago, due to severe myalgias had advised her to hold off on Crestor. Symptoms of myalgias has resolved. She denies PND, orthopnea, edema, palpitations, or symptoms suggestive of TIA.     Problem List/Past Medical(April Louretta Shorten; 08/18/2014 3:34 PM) Anxiety and depression (F41.9, F32.9) Hypothyroidism, adult (E03.9) Frequent headaches (R51) Encounter for preprocedural cardiovascular examination (Z01.810). Set up for coronary angiogram 03/04/2014  Labs 02/26/2014: CBC normal, creatinine 0.83, BMP normal, PT 10.1, INR 1.0, aPTT 25 Centrilobular emphysema (J43.2). Pulmonology office visit 04/23/2014 Baird Lyons, MD): Schedule PFT with 6 minute walk test, overnight oximetry on room air, chest x-ray, and labs including alpha-1 antitrypsin  assay Benign essential hypertension (I10). Labs 05/08/2014: CBC normal, creatinine 0.8, CMP normal, HbA1c 5.1%, TSH 0.340  Labs 02/10/2014: CBC normal, creatinine 0.74, eGFR 91, CMP normal Nocturnal leg cramps (G47.62). Lower extremity arterial duplex 03/11/2014: No hemodynamically significant stenoses are identified in the bilateral lower extremity arterial system. Normal bilateral ABI. Cough due to ACE inhibitor (R05) Angina, class II (I20.9) Hyperlipidemia, group B (E78.1). Labs 05/08/2014: Total cholesterol 134, triglycerides 99, HDL 56, LDL 58, LDL particle #643, LP-IR score 36  Labs 02/10/2014: Total cholesterol 191, triglycerides 196, HDL 48, LDL 104, LDL particle # 931, LP-IR score 57 Atherosclerosis of native coronary artery of native heart with angina pectoris (I25.119). Coronary angiogram 03/04/2014: Superdominant RCA, prior stents from 04/08/2013 widely patent, PDA large, flush occluded at the prior stented segment with faint recanalization and large contralateral type II collaterals. Mild disease left system. LVEF 55%. No significant change from 08/22/2013.  Coronary angiogram 04/08/2013: Complex, difficult overlapping stent implantation for ISR (01/09/2013) to mid and distal RCA extending into PL with 3.5 x 28 mm Promus DES. PDA stent 2.25 x 20 mm Promus DES, occluded after stent crush from PL stent. Mild diffuse disease left system. EF 65% Malaise and fatigue (R53.81, R53.83). Nocturnal oximetry 03/19/2014:Time < 88%: 324.5 minutes, time < 89% 363.9 minutes, low SpO2 74%, total desaturation events 377, average events per hour 37. On Nocturnal O2 since 07/02/2014. Rx by Danton Sewer. Suspected due to COPD Myalgia (M79.1) Gastroesophageal reflux disease without esophagitis (K21.9) DJD (degenerative joint disease) (M19.90)    Allergies(April Louretta Shorten; 08/18/2014 3:39 PM) Isosorbide Mononitrate *ANTIANGINAL AGENTS*. Headache. Severe headache Levofloxacin *CHEMICALS* Simvastatin  *ANTIHYPERLIPIDEMICS* Tetracycline *CHEMICALS* Bactrim *ANTI-INFECTIVE AGENTS - MISC.* Toradol *ANALGESICS - ANTI-INFLAMMATORY*. Difficulty breathing. Morphine Sulfate (Concentrate) *ANALGESICS -  OPIOID* Atorvastatin Calcium *ANTIHYPERLIPIDEMICS*. 2008 Myalgia Lisinopril *ANTIHYPERTENSIVES*. 02/21/2014 10:07 AM Cough. Crestor *ANTIHYPERLIPIDEMICS*. severe cramps    Family History(April Louretta Shorten; 09-13-2014 3:34 PM) Father. Deceased. at age64 from MI. Had COPD & asthma Sister 88. 10- 13 years older has HTN, diabetes. Mother. Deceased. at age 54 from lung cancer, had HTN, diabetes, stroke    Social History(April Louretta Shorten; 09-13-2014 3:34 PM) Number of Children. 1. Marital status. Married. Current tobacco use. Former smoker. quit smoking 18 years ago. (approx 40 pack year history of smoking) Living Situation. Lives with spouse. Non Drinker/No Alcohol Use    Past Surgical History(April Louretta Shorten; 13-Sep-2014 3:34 PM) Tonsillectomy. 1965 Thyroidicetctomy. 1966 Appendectomy. 1971 Tubal Ligation. [place] Surgery. 2006 Right. Arthroscopic Knee Surgery - Both. 2008 Cervical Disk Surgery. 2008 Hysterectomy; Total. 2009 abd hysterectomy, and oophorectomy R [place] surgery. 2009 Heart Cath. 04/2013 has 4 stents had a MI 2014, MI during placing a stent 04/2013. MI in hospital that night. Hand Surgery . 05/2013 Bilateral. and left arm s/p dog bites    Medication History(April Louretta Shorten; 09-13-14 3:46 PM) AmLODIPine Besylate (2.5MG Tablet, 1 (one) Tablet Oral daily, Taken starting 03/14/2014) Active. Nitrostat (0.4MG Tab Sublingual, 1 (one) Tab Sublingual Sublingual every 5 minutes as needed for chest pain., Taken starting 02/10/2014) Active. Ranexa (1000MG Tablet ER 12HR, 1 (one) Tablet ER 12HR Oral two times daily, Taken 03/14/2014 to 08/06/2014) Discontinued: Patient Choice. Metoprolol Tartrate (25MG Tablet, 1 (one) Oral two times daily, Taken starting 03/14/2014)  Active. Crestor (20MG Tablet,  (one half) Oral daily, Taken 07/07/2014 to 08/06/2014) Discontinued: severe cramps. Spironolactone (25MG Tablet, 1 Oral daily, Taken starting 07/08/2014) Active. Pantoprazole Sodium (40MG Tablet DR, 1 (one) Tablet DR Oral once daily 30 minutes before meal, Taken starting 07/07/2014) Active. (Cancel Omeprazole- Efficacy and patient on plavix) Clopidogrel Bisulfate (75MG Tablet, 1 Oral daily, Taken starting 08/06/2014) Active. Allegra Allergy (60MG Tablet, 1 Oral as needed) Active. ClonazePAM ODT (1MG Tablet Disperse, 1/2 to 1 Oral two times daily as neeed for anxiety/insomnia) Active. Cymbalta (60MG Capsule DR Part, 1 Oral daily) Active. Synthroid (175MCG Tablet, 1 Oral daily) Active. Aspirin EC (81MG Tablet DR, 1 Oral daily) Active. Ibuprofen 200 (200MG Tablet, 4 Oral as needed for headache/chest pain) Active. Tylenol Extra Strength (500MG Tablet, 2 Oral as needed for headaches) Active. Amoxicillin (500MG Capsule, 1 Oral three times daily for 10 days, Taken starting 08/15/2014) Active. Triamcinolone Acetonide (0.1% Cream, External as needed) Active. Clotrimazole-Betamethasone (1-0.05% Cream, External as needed) Active. Oxygen (2 L at night) Active. Medications Reconciled.    Diagnostic Studies History(April Louretta Shorten; 2014-09-13 3:34 PM) Treadmill stress test. 2005 Normal. Colonoscopy. 02/16/2010 Abnormal. 9 benign polys removed Carotid Doppler. 05/2013 Results: Marland Kitchen Unknown Echocardiogram. 05/16/2013 LVEF 60-65%, mild LVH, grade I diastolic dysfunction, right ventricle mildly dilated Coronary Angiogram. 08/22/2013 Repeat coronary angiography on 08/22/2013 revealed widely patent RCA stent and flushed occluded PDA with contralateral collaterals. Mild disease in the left system.  Coronary angiogram 04/08/2013: Complex, difficult overlapping stent implantation for ISR (01/09/2013) to mid and distal RCA extending into PL with 3.5 x 28 mm Promus DES. PDA stent 2.25 x  20 mm Promus DES, occluded after stent crush from PL stent. Mild diffuse disease left system. EF 65%    Other Problems(April Louretta Shorten; 2014/09/13 3:34 PM) Unspecified Diagnosis    Review of Systems(Jagadeesh Carlynn Herald, MD; 13-Sep-2014 5:07 PM) General:Present- Fatigue. Not Present- Anorexia and Fever. Respiratory:Present- Decreased Exercise Tolerance(chronic due to COPD) and Difficulty Breathing on Exertion. Not Present- Cough. Cardiovascular:Present- Chest Painand Leg Cramps. Not Present- Edema,  Orthopnea and Paroxysmal Nocturnal Dyspnea. Gastrointestinal:Present- Heartburn(worse last 2 weeks). Not Present- Change in Bowel Habits, Constipation and Nausea. Neurological:Not Present- Focal Neurological Symptoms. Endocrine:Not Present- Appetite Changes, Cold Intolerance and Heat Intolerance. Hematology:Not Present- Anemia, Petechiae and Prolonged Bleeding.    Vitals(April Garrison; 08/18/2014 3:47 PM) 08/18/2014 3:34 PM Weight: 184.19 lb Height: 66 in Body Surface Area: 1.97 m Body Mass Index: 29.73 kg/m Pulse: 81 (Regular) P.OX: 92% (Room air) BP: 116/82 (Sitting, Left Arm, Standard)     Physical Exam(Jagadeesh Carlynn Herald, MD; 08/18/2014 5:07 PM) The physical exam findings are as follows:   General Mental Status- Alert. General Appearance- Cooperative and Appears stated age. Not in acute distress. Orientation- Oriented X3. Build & Nutrition- Well built and Well nourished.   Head and Neck Thyroid Gland Characteristics- no palpable nodules. no palpable enlargement.   Chest and Lung Exam Palpation:Tender- No chest wall tenderness. Auscultation: Breath sounds:- Clear.   Cardiovascular Inspection:Jugular vein- Right- No Distention. Auscultation:Heart Sounds- S1 WNL, S2 WNL and No gallop present. Murmurs & Other Heart Sounds: Murmur:- No murmur.   Abdomen Palpation/Percussion:Palpation and Percussion of the abdomen reveal  - Non Tender and No hepatosplenomegaly. Auscultation:Auscultation of the abdomen reveals - Bowel sounds normal.   Peripheral Vascular Lower Extremity:Inspection- Left- No Pigmentation or Varicose veins. Right- No Pigmentation or Varicose veins. Palpation:Edema- Left- No edema. Right- No edema. Femoral pulse- Left- Normal. Right- Normal. Popliteal pulse- Left- Normal. Right- Normal. Dorsalis pedis pulse- Left- Normal. Right- Normal. Posterior tibial pulse- Left- Normal. Right- Normal. Carotid arteries- Left- No Carotid bruit. Right- No Carotid bruit. Abdomen- No prominent abdominal aortic pulsation or epigastric bruit.   Neurologic Motor:- Grossly intact without any focal deficits.   Musculoskeletal Global Assessment Left Lower Extremity - normal range of motion without pain. Right Lower Extremity- normal range of motion without pain.    Assessment & Plan(Devonna Shumate; 08/18/2014 5:20 PM) Atherosclerosis of native coronary artery of native heart with angina pectoris (I25.119) Story: Coronary angiogram 03/04/2014: Superdominant RCA, prior stents from 04/08/2013 widely patent, PDA large, flush occluded at the prior stented segment with faint recanalization and large contralateral type II collaterals. Mild disease left system. LVEF 55%. No significant change from 08/22/2013.  Coronary angiogram 04/08/2013: Complex, difficult overlapping stent implantation for ISR (01/09/2013) to mid and distal RCA extending into PL with 3.5 x 28 mm Promus DES. PDA stent 2.25 x 20 mm Promus DES, occluded after stent crush from PL stent. Mild diffuse disease left system. EF 65% Impression: EKG 07/07/2014: Normal sinus rhythm at rate of 80 bpm, normal intervals. No evidence of ischemia, normal EKG. No significant change 02/21/2014.  EKG 02/10/2014: Normal sinus rhythm, ventricular rate 71 bpm, normal axis. Nonspecific inferior and lateral ST segment depression of 0.5 mm, normal QT  interval. Current Plans l Discontinued Metoprolol Tartrate 25MG (Fatigue). l Started Bystolic 5MG, 1 (one) Tablet daily, #30, 08/18/2014, Ref. x1. l METABOLIC PANEL, COMPREHENSIVE (80053) l CBC & PLATELETS (AUTO) (85027) l PT (PROTHROMBIN TIME) (66294)  Hyperlipidemia, group B (E78.1) Story: Labs 05/08/2014: Total cholesterol 134, triglycerides 99, HDL 56, LDL 58, LDL particle #643, LP-IR score 36  Labs 02/10/2014: Total cholesterol 191, triglycerides 196, HDL 48, LDL 104, LDL particle # 931, LP-IR score 57 Current Plans l Restarted Pravastatin Sodium 80MG, 1 Tablet daily, #30, 08/18/2014, Ref. x2.  Malaise and fatigue (R53.81) Story: Nocturnal oximetry 03/19/2014:Time < 88%: 324.5 minutes, time < 89% 363.9 minutes, low SpO2 74%, total desaturation events 377, average events per hour 37. On Nocturnal O2 since 07/02/2014.  Rx by Danton Sewer. Suspected due to COPD Current Plans l Mechanism of underlying disease process and action of medications discussed with the patient. I discussed primary/secondary prevention and also dietary counceling was done. Patient presents to me for follow-up and evaluation of angina pectoris, patient continues to have severe symptoms of angina pectoris in spite of aggressive medical therapy. I have reviewed the angiograms at the bedside with the patient and her husband, patient has a moderate-sized PDA that is occluded. There is faint recanalization, I can certainly attempt to perform angioplasty. Patient states that she is even willing to go to bypass surgery to decrease her pain. I am not sure that the vessel is large enough for CABG, also the distal end of the segment where there is a secondary branch appears to be diffusely diseased also.  With regard to myalgias, I have discontinued Crestor, she was tolerating pravastatin without any side effects, I have restarted her back at 80 mg by mouth every evening. I discontinued metoprolol and  given her samples of Bystolic 5 mg by mouth daily which has basically dilated properties, set her up for coronary angiography. However I have advised the patient that if symptoms of angina does get relief, we will cancel coronary angiography. I have tentatively set her up to see me back in the office in 2 months. She cannot tolerate any sublingual nitroglycerin or long-acting nitrate due to severe migraine headaches.     Addendum Note(Bridgette Ebony Hail, AGNP-C; 08/27/2014 5:14 PM) 08/26/2014: Creatinine 0.71, CMP normal, CBC normal, PT 9.9, INR 1.0    Signed electronically by Laverda Page, MD (08/18/2014 5:24 PM)

## 2014-09-01 DIAGNOSIS — J439 Emphysema, unspecified: Secondary | ICD-10-CM | POA: Diagnosis not present

## 2014-09-01 DIAGNOSIS — J449 Chronic obstructive pulmonary disease, unspecified: Secondary | ICD-10-CM | POA: Diagnosis not present

## 2014-09-02 ENCOUNTER — Encounter (HOSPITAL_COMMUNITY): Payer: Self-pay | Admitting: *Deleted

## 2014-09-02 ENCOUNTER — Ambulatory Visit (HOSPITAL_COMMUNITY)
Admission: RE | Admit: 2014-09-02 | Discharge: 2014-09-02 | Disposition: A | Discharge status: Home / Self Care | Payer: Commercial Managed Care - HMO | Source: Ambulatory Visit | Attending: Cardiology | Admitting: Cardiology

## 2014-09-02 ENCOUNTER — Encounter (HOSPITAL_COMMUNITY)
Admission: RE | Disposition: A | Discharge status: Home / Self Care | Payer: Commercial Managed Care - HMO | Source: Ambulatory Visit | Attending: Cardiology

## 2014-09-02 DIAGNOSIS — M1990 Unspecified osteoarthritis, unspecified site: Secondary | ICD-10-CM | POA: Diagnosis not present

## 2014-09-02 DIAGNOSIS — R5381 Other malaise: Secondary | ICD-10-CM | POA: Diagnosis not present

## 2014-09-02 DIAGNOSIS — Z87891 Personal history of nicotine dependence: Secondary | ICD-10-CM | POA: Insufficient documentation

## 2014-09-02 DIAGNOSIS — J449 Chronic obstructive pulmonary disease, unspecified: Secondary | ICD-10-CM | POA: Diagnosis not present

## 2014-09-02 DIAGNOSIS — K219 Gastro-esophageal reflux disease without esophagitis: Secondary | ICD-10-CM | POA: Diagnosis not present

## 2014-09-02 DIAGNOSIS — I252 Old myocardial infarction: Secondary | ICD-10-CM | POA: Insufficient documentation

## 2014-09-02 DIAGNOSIS — E039 Hypothyroidism, unspecified: Secondary | ICD-10-CM | POA: Diagnosis not present

## 2014-09-02 DIAGNOSIS — E785 Hyperlipidemia, unspecified: Secondary | ICD-10-CM | POA: Insufficient documentation

## 2014-09-02 DIAGNOSIS — I25118 Atherosclerotic heart disease of native coronary artery with other forms of angina pectoris: Secondary | ICD-10-CM | POA: Diagnosis not present

## 2014-09-02 DIAGNOSIS — Y712 Prosthetic and other implants, materials and accessory cardiovascular devices associated with adverse incidents: Secondary | ICD-10-CM | POA: Diagnosis not present

## 2014-09-02 DIAGNOSIS — T82858A Stenosis of vascular prosthetic devices, implants and grafts, initial encounter: Secondary | ICD-10-CM | POA: Insufficient documentation

## 2014-09-02 DIAGNOSIS — I10 Essential (primary) hypertension: Secondary | ICD-10-CM | POA: Diagnosis not present

## 2014-09-02 DIAGNOSIS — Z7982 Long term (current) use of aspirin: Secondary | ICD-10-CM | POA: Insufficient documentation

## 2014-09-02 DIAGNOSIS — I2510 Atherosclerotic heart disease of native coronary artery without angina pectoris: Secondary | ICD-10-CM | POA: Diagnosis present

## 2014-09-02 DIAGNOSIS — F418 Other specified anxiety disorders: Secondary | ICD-10-CM | POA: Insufficient documentation

## 2014-09-02 DIAGNOSIS — Z8679 Personal history of other diseases of the circulatory system: Secondary | ICD-10-CM | POA: Diagnosis not present

## 2014-09-02 HISTORY — PX: CARDIAC CATHETERIZATION: SHX172

## 2014-09-02 LAB — POCT ACTIVATED CLOTTING TIME
ACTIVATED CLOTTING TIME: 319 s
Activated Clotting Time: 239 seconds
Activated Clotting Time: 251 seconds

## 2014-09-02 SURGERY — LEFT HEART CATH AND CORONARY ANGIOGRAPHY

## 2014-09-02 MED ORDER — SODIUM CHLORIDE 0.9 % WEIGHT BASED INFUSION: 3.0000 mL/kg/h | INTRAVENOUS | Status: DC

## 2014-09-02 MED ORDER — ASPIRIN 81 MG PO CHEW
CHEWABLE_TABLET | ORAL | Status: AC
  Filled 2014-09-02: qty 1

## 2014-09-02 MED ORDER — SODIUM CHLORIDE 0.9 % IV SOLN: 250.0000 mL | INTRAVENOUS | Status: DC | PRN

## 2014-09-02 MED ORDER — HEPARIN SODIUM (PORCINE) 1000 UNIT/ML IJ SOLN
INTRAMUSCULAR | Status: AC
  Filled 2014-09-02: qty 1

## 2014-09-02 MED ORDER — HEPARIN (PORCINE) IN NACL 2-0.9 UNIT/ML-% IJ SOLN
INTRAMUSCULAR | Status: AC
  Filled 2014-09-02: qty 1000

## 2014-09-02 MED ORDER — SODIUM CHLORIDE 0.9 % IJ SOLN: 3.0000 mL | INTRAMUSCULAR | Status: DC | PRN

## 2014-09-02 MED ORDER — MIDAZOLAM HCL 2 MG/2ML IJ SOLN
INTRAMUSCULAR | Status: AC
  Filled 2014-09-02: qty 2

## 2014-09-02 MED ORDER — LIDOCAINE HCL (PF) 1 % IJ SOLN
INTRAMUSCULAR | Status: DC | PRN
  Administered 2014-09-02: 2 mL

## 2014-09-02 MED ORDER — VERAPAMIL HCL 2.5 MG/ML IV SOLN
INTRAVENOUS | Status: DC | PRN
  Administered 2014-09-02: 10:00:00 via INTRA_ARTERIAL

## 2014-09-02 MED ORDER — HYDROMORPHONE HCL 1 MG/ML IJ SOLN
INTRAMUSCULAR | Status: AC
  Filled 2014-09-02: qty 1

## 2014-09-02 MED ORDER — SODIUM CHLORIDE 0.9 % WEIGHT BASED INFUSION
3.0000 mL/kg/h | INTRAVENOUS | Status: DC
  Administered 2014-09-02: 3 mL/kg/h via INTRAVENOUS

## 2014-09-02 MED ORDER — HYDROMORPHONE HCL 1 MG/ML IJ SOLN
INTRAMUSCULAR | Status: DC | PRN
  Administered 2014-09-02 (×3): 0.5 mg via INTRAVENOUS

## 2014-09-02 MED ORDER — SODIUM CHLORIDE 0.9 % IJ SOLN: 3.0000 mL | Freq: Two times a day (BID) | INTRAMUSCULAR | Status: DC

## 2014-09-02 MED ORDER — ASPIRIN 81 MG PO CHEW
81.0000 mg | CHEWABLE_TABLET | ORAL | Status: AC
  Administered 2014-09-02: 81 mg via ORAL

## 2014-09-02 MED ORDER — LIDOCAINE HCL (PF) 1 % IJ SOLN
INTRAMUSCULAR | Status: AC
  Filled 2014-09-02: qty 30

## 2014-09-02 MED ORDER — NITROGLYCERIN 1 MG/10 ML FOR IR/CATH LAB
INTRA_ARTERIAL | Status: AC
  Filled 2014-09-02: qty 10

## 2014-09-02 MED ORDER — SODIUM CHLORIDE 0.9 % WEIGHT BASED INFUSION
1.0000 mL/kg/h | INTRAVENOUS | Status: DC
Start: 2014-09-03 — End: 2014-09-02

## 2014-09-02 MED ORDER — IOHEXOL 350 MG/ML SOLN
INTRAVENOUS | Status: DC | PRN
  Administered 2014-09-02: 155 mL via INTRA_ARTERIAL

## 2014-09-02 MED ORDER — MIDAZOLAM HCL 2 MG/2ML IJ SOLN
INTRAMUSCULAR | Status: DC | PRN
  Administered 2014-09-02: 2 mg via INTRAVENOUS
  Administered 2014-09-02: 1 mg via INTRAVENOUS

## 2014-09-02 MED ORDER — VERAPAMIL HCL 2.5 MG/ML IV SOLN
INTRAVENOUS | Status: AC
  Filled 2014-09-02: qty 2

## 2014-09-02 MED ORDER — HEPARIN SODIUM (PORCINE) 1000 UNIT/ML IJ SOLN
INTRAMUSCULAR | Status: DC | PRN
  Administered 2014-09-02: 3000 [IU] via INTRAVENOUS
  Administered 2014-09-02: 2000 [IU] via INTRAVENOUS
  Administered 2014-09-02: 6000 [IU] via INTRAVENOUS

## 2014-09-02 SURGICAL SUPPLY — 17 items
CATH HEARTRAIL 6F IR1.5 (CATHETERS) ×1 IMPLANT
CATH INFINITI 5FR MPB2 (CATHETERS) IMPLANT
CATH MICROGUIDE FINCRSS 150 CM (MICROCATHETER) IMPLANT
CATH OPTITORQUE TIG 4.0 5F (CATHETERS) ×2 IMPLANT
DEVICE RAD COMP TR BAND LRG (VASCULAR PRODUCTS) ×2 IMPLANT
GLIDESHEATH SLEND A-KIT 6F 20G (SHEATH) ×2 IMPLANT
KIT ESSENTIALS PG (KITS) ×1 IMPLANT
KIT HEART LEFT (KITS) ×2 IMPLANT
MICROGUIDE FINECROSS 150 CM (MICROCATHETER) ×2
PACK CARDIAC CATHETERIZATION (CUSTOM PROCEDURE TRAY) ×2 IMPLANT
SHEATH PINNACLE 5F 10CM (SHEATH) IMPLANT
TRANSDUCER W/STOPCOCK (MISCELLANEOUS) ×2 IMPLANT
TUBING CIL FLEX 10 FLL-RA (TUBING) ×2 IMPLANT
WIRE ASAHI FIELDER XT 300CM (WIRE) ×1 IMPLANT
WIRE ASAHI MIRACLEBROS12 180CM (WIRE) ×1 IMPLANT
WIRE EMERALD 3MM-J .035X150CM (WIRE) IMPLANT
WIRE SAFE-T 1.5MM-J .035X260CM (WIRE) ×2 IMPLANT

## 2014-09-02 NOTE — Progress Notes (Signed)
TR BAND REMOVAL  LOCATION:    right radial  DEFLATED PER PROTOCOL:    Yes.    TIME BAND OFF / DRESSING APPLIED:    1330   SITE UPON ARRIVAL:    Level 0  SITE AFTER BAND REMOVAL:    Level 0  CIRCULATION SENSATION AND MOVEMENT:    Within Normal Limits   Yes.    COMMENTS:   TRB REMOVED/ TEGADERM DSG APPLIED

## 2014-09-02 NOTE — Interval H&P Note (Signed)
History and Physical Interval Note:  09/02/2014 8:58 AM  Amy Hodge  has presented today for surgery, with the diagnosis of cp/cad  The various methods of treatment have been discussed with the patient and family. After consideration of risks, benefits and other options for treatment, the patient has consented to  Procedure(s): Left Heart Cath and Coronary Angiography (N/A) and possible PCI as a surgical intervention .  The patient's history has been reviewed, patient examined, no change in status, stable for surgery.  I have reviewed the patient's chart and labs.  Questions were answered to the patient's satisfaction.   Procedure being observed by Ms. Elita Boone PA student and Beaulah Corin [employer] [facility] student)  and patient consents to this.  Ischemic Symptoms? CCS III (Marked limitation of ordinary activity) Anti-ischemic Medical Therapy? Maximal Medical Therapy (2 or more classes of medications) Non-invasive Test Results? No non-invasive testing performed Prior CABG? No Previous CABG   Patient Information:   1-2V CAD, no prox LAD  A (7)  Indication: 20; Score: 7   Patient Information:   1-2V-CAD with DS 50-60% With No FFR, No IVUS  I (3)  Indication: 21; Score: 3   Patient Information:   1-2V-CAD with DS 50-60% With FFR  A (7)  Indication: 22; Score: 7   Patient Information:   1-2V-CAD with DS 50-60% With FFR>0.8, IVUS not significant  I (2)  Indication: 23; Score: 2   Patient Information:   3V-CAD without LMCA With Abnormal LV systolic function  A (9)  Indication: 48; Score: 9   Patient Information:   LMCA-CAD  A (9)  Indication: 49; Score: 9   Patient Information:   2V-CAD with prox LAD PCI  A (7)  Indication: 62; Score: 7   Patient Information:   2V-CAD with prox LAD CABG  A (8)  Indication: 62; Score: 8   Patient Information:   3V-CAD without LMCA With Low CAD burden(i.e., 3 focal stenoses, low SYNTAX score) PCI  A (7)   Indication: 63; Score: 7   Patient Information:   3V-CAD without LMCA With Low CAD burden(i.e., 3 focal stenoses, low SYNTAX score) CABG  A (9)  Indication: 63; Score: 9   Patient Information:   3V-CAD without LMCA E06c - Intermediate-high CAD burden (i.e., multiple diffuse lesions, presence of CTO, or high SYNTAX score) PCI  U (4)  Indication: 64; Score: 4   Patient Information:   3V-CAD without LMCA E06c - Intermediate-high CAD burden (i.e., multiple diffuse lesions, presence of CTO, or high SYNTAX score) CABG  A (9)  Indication: 64; Score: 9   Patient Information:   LMCA-CAD With Isolated LMCA stenosis  PCI  U (6)  Indication: 65; Score: 6   Patient Information:   LMCA-CAD With Isolated LMCA stenosis  CABG  A (9)  Indication: 65; Score: 9   Patient Information:   LMCA-CAD Additional CAD, low CAD burden (i.e., 1- to 2-vessel additional involvement, low SYNTAX score) PCI  U (5)  Indication: 66; Score: 5   Patient Information:   LMCA-CAD Additional CAD, low CAD burden (i.e., 1- to 2-vessel additional involvement, low SYNTAX score) CABG  A (9)  Indication: 66; Score: 9   Patient Information:   LMCA-CAD Additional CAD, intermediate-high CAD burden (i.e., 3-vessel involvement, presence of CTO, or high SYNTAX score) PCI  I (3)  Indication: 67; Score: 3   Patient Information:   LMCA-CAD Additional CAD, intermediate-high CAD burden (i.e., 3-vessel involvement, presence of CTO, or high SYNTAX score) CABG  A (9)  Indication: 67; Score: 9   Amy Hodge

## 2014-09-02 NOTE — Discharge Instructions (Signed)
Radial Site Care °Refer to this sheet in the next few weeks. These instructions provide you with information on caring for yourself after your procedure. Your caregiver may also give you more specific instructions. Your treatment has been planned according to current medical practices, but problems sometimes occur. Call your caregiver if you have any problems or questions after your procedure. °HOME CARE INSTRUCTIONS °· You may shower the day after the procedure. Remove the bandage (dressing) and gently wash the site with plain soap and water. Gently pat the site dry. °· Do not apply powder or lotion to the site. °· Do not submerge the affected site in water for 3 to 5 days. °· Inspect the site at least twice daily. °· Do not flex or bend the affected arm for 24 hours. °· No lifting over 5 pounds (2.3 kg) for 5 days after your procedure. °· Do not drive home if you are discharged the same day of the procedure. Have someone else drive you. °· You may drive 24 hours after the procedure unless otherwise instructed by your caregiver. °· Do not operate machinery or power tools for 24 hours. °· A responsible adult should be with you for the first 24 hours after you arrive home. °What to expect: °· Any bruising will usually fade within 1 to 2 weeks. °· Blood that collects in the tissue (hematoma) may be painful to the touch. It should usually decrease in size and tenderness within 1 to 2 weeks. °SEEK IMMEDIATE MEDICAL CARE IF: °· You have unusual pain at the radial site. °· You have redness, warmth, swelling, or pain at the radial site. °· You have drainage (other than a small amount of blood on the dressing). °· You have chills. °· You have a fever or persistent symptoms for more than 72 hours. °· You have a fever and your symptoms suddenly get worse. °· Your arm becomes pale, cool, tingly, or numb. °· You have heavy bleeding from the site. Hold pressure on the site. °Document Released: 03/26/2010 Document Revised:  05/16/2011 Document Reviewed: 03/26/2010 °ExitCare® Patient Information ©2015 [employer]. This information is not intended to replace advice given to you by your health care provider. Make sure you discuss any questions you have with your health care provider. ° °

## 2014-09-03 ENCOUNTER — Encounter (HOSPITAL_COMMUNITY): Payer: Self-pay | Admitting: Cardiology

## 2014-09-03 MED FILL — Nitroglycerin IV Soln 100 MCG/ML in D5W: INTRA_ARTERIAL | Qty: 10 | Status: AC

## 2014-09-03 MED FILL — Heparin Sodium (Porcine) 2 Unit/ML in Sodium Chloride 0.9%: INTRAMUSCULAR | Qty: 1000 | Status: AC

## 2014-09-11 DIAGNOSIS — I25119 Atherosclerotic heart disease of native coronary artery with unspecified angina pectoris: Secondary | ICD-10-CM | POA: Diagnosis not present

## 2014-09-11 DIAGNOSIS — I209 Angina pectoris, unspecified: Secondary | ICD-10-CM | POA: Diagnosis not present

## 2014-10-01 DIAGNOSIS — J439 Emphysema, unspecified: Secondary | ICD-10-CM | POA: Diagnosis not present

## 2014-10-01 DIAGNOSIS — J449 Chronic obstructive pulmonary disease, unspecified: Secondary | ICD-10-CM | POA: Diagnosis not present

## 2014-10-23 ENCOUNTER — Encounter: Payer: Self-pay | Admitting: Internal Medicine

## 2014-10-23 ENCOUNTER — Ambulatory Visit (INDEPENDENT_AMBULATORY_CARE_PROVIDER_SITE_OTHER)
Admission: RE | Admit: 2014-10-23 | Discharge: 2014-10-23 | Disposition: A | Discharge status: Home / Self Care | Payer: Commercial Managed Care - HMO | Source: Ambulatory Visit | Attending: Internal Medicine | Admitting: Internal Medicine

## 2014-10-23 ENCOUNTER — Ambulatory Visit (INDEPENDENT_AMBULATORY_CARE_PROVIDER_SITE_OTHER): Payer: Commercial Managed Care - HMO | Admitting: Internal Medicine

## 2014-10-23 VITALS — BP 128/64 | HR 72 | Ht 66.0 in | Wt 188.4 lb

## 2014-10-23 DIAGNOSIS — R0602 Shortness of breath: Secondary | ICD-10-CM | POA: Diagnosis not present

## 2014-10-23 DIAGNOSIS — J441 Chronic obstructive pulmonary disease with (acute) exacerbation: Secondary | ICD-10-CM

## 2014-10-23 DIAGNOSIS — J449 Chronic obstructive pulmonary disease, unspecified: Secondary | ICD-10-CM | POA: Diagnosis not present

## 2014-10-23 DIAGNOSIS — R042 Hemoptysis: Secondary | ICD-10-CM

## 2014-10-23 DIAGNOSIS — R05 Cough: Secondary | ICD-10-CM | POA: Diagnosis not present

## 2014-10-23 MED ORDER — FLUTICASONE FUROATE-VILANTEROL 100-25 MCG/INH IN AEPB
1.0000 | INHALATION_SPRAY | Freq: Every day | RESPIRATORY_TRACT | Status: DC
Start: 2014-10-23 — End: 2015-06-27

## 2014-10-23 NOTE — Progress Notes (Signed)
04/23/14- 58 yoF referred courtesy of: Dr Einar Gip sent patient but PCP is Ramachandran; COPD Husband here She had smoked a pack a day, quitting 18 years ago. Diagnosed with COPD in 2000 at my old office. She says she was diagnosed at age 59 with bronchiectasis and "they blocked the lower left lung".. I'm not sure what kind of treatment she is referring to. Daily cough productive clear sputum or sometimes trace red while taking Plavix and aspirin dyspnea on exertion with hills and stairs. Cough is disturbing sleep at night. Lisinopril had caused cough and was changed. Albuterol rescue inhaler used about twice a week. Triggers include spring pollens, cold winter air and hot humid summer air. 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 CXR 05/05/13 FINDINGS Cardiopericardial silhouette within normal limits. Mediastinal contours normal. Trachea midline. No airspace disease or effusion. Basilar atelectasis. Left mid lung atelectasis or scarring. No displaced rib fractures or pneumothorax. Surgical clips in the thoracic inlet.  IMPRESSION Basilar atelectasis. No acute cardiopulmonary disease. SIGNATURE Electronically Signed  By: Dereck Ligas M.D.  On: 05/15/2013 19:04 ECHO 05/16/13 EF 60-65%, GR1 diast dysfunction  06/23/14- 58 yoF former smoker followed for COPD FOLLOWS FOR: Pt states she is having increased SOB with allergies flaring up; review 6MW, PFT, ONO, xray, and labs with patient. Some seasonal rhinitis. Zyrtec not enough help. Persistent cough and wheeze. Short of breath walking half the distance of her driveway. Stopped to rest before returning. Rescue inhaler not enough help. a1AT WNL MM 138 6MWT-06/23/2014-94%, 93%, 99%, 452 m on room air. No oxygen limitation. PFT-06/23/14- moderate obstructive airways disease with insignificant response to bronchodilator, normal lung  capacity, diffusion mildly reduced. FVC 2.64/70%, FEV1 1.92/65%, FEV1/FVC 0.72 ONOX-04/30/14- 314 minutes with oxygen saturation less than or equal to 88% on room air, qualifying for home O2 during sleep CXR 04/23/14-  IMPRESSION: COPD with right basilar scarring versus subsegmental atelectasis. There is no pneumonia, CHF, nor other acute cardiopulmonary abnormality. Electronically Signed  By: David Martinique  On: 04/23/2014 14:48  10/23/14- 12 yoF former smoker followed for COPD mixed type, complicated by CAD  Follows For: Increased sob with hot weather, prod cough (clear with 2 episodes of hemoptysis wiht bright red blood) Occas wheezing and chest tightness        husband here More dyspnea on exertion last 2 months maybe because she has been more active. Continues on Plavix-has noted a little blood twice. No other bleeding or bruising. Increase cough dry or white sputum with tinge of yellow. Rescue inhaler used occasionally, does help.  ROS-see HPI Constitutional:   No-   weight loss, night sweats, fevers, chills, fatigue, lassitude. HEENT:   + headaches, difficulty swallowing, tooth/dental problems, sore throat,       No-  sneezing, itching, ear ache, +nasal congestion, post nasal drip,  CV:  +chest pain, orthopnea, PND, swelling in lower extremities, anasarca,                                                          dizziness, +palpitations Resp: +shortness of breath with exertion or at rest.              +  productive cough,  No non-productive cough,  +  coughing up of blood.              +  change in color of mucus.  wheezing.   Skin: No-   rash or lesions. GI:  + heartburn, indigestion, abdominal pain, nausea, vomiting,  GU:  MS:  +  joint pain or swelling.   Neuro-     nothing unusual Psych:  No- change in mood or affect. No depression or anxiety.  No memory loss.  OBJ- Physical Exam General- Alert, Oriented, Affect-appropriate, Distress- none acute Skin- rash-none, lesions-  none, excoriation- none Lymphadenopathy- none Head- atraumatic            Eyes- Gross vision intact, PERRLA, conjunctivae and secretions clear            Ears- Hearing, canals-normal            Nose- Clear, no-Septal dev, mucus, polyps, erosion, perforation             Throat- Mallampati II , mucosa clear , drainage- none, tonsils- atrophic,                    + dentures Neck- flexible , trachea midline, no stridor , thyroid nl, carotid no bruit Chest - symmetrical excursion , unlabored           Heart/CV- RRR , no murmur , no gallop  , no rub, nl s1 s2                           - JVD- none , edema- none, stasis changes- none, varices- none           Lung- clear to P&A, wheeze- none, cough- none , dullness-none, rub- none           Chest wall-  Abd-  Br/ Gen/ Rectal- Not done, not indicated Extrem- cyanosis- none, clubbing, none, atrophy- none, strength- nl Neuro- grossly intact to observation

## 2014-10-23 NOTE — Patient Instructions (Signed)
Order - CXR    Dx hemoptysis   Sample Breo Ellipta 100          1 puff then rinse mouth well, one time daily- maintenance inhaler  See if it helps.

## 2014-10-24 DIAGNOSIS — R042 Hemoptysis: Secondary | ICD-10-CM | POA: Insufficient documentation

## 2014-10-24 NOTE — Assessment & Plan Note (Signed)
This is probably minor benign bleeding associated with area of bronchitis, while on Plavix Plan-chest x-ray. She understands to report if blood continues.

## 2014-10-24 NOTE — Assessment & Plan Note (Signed)
Not sure if there is really been an exacerbation of her bronchitis component, or simply more dyspnea progressive activity and hot weather. Rescue inhaler continues to help. Plan-try sample Breo Ellipta maintenance inhaler, chest x-ray

## 2014-10-31 ENCOUNTER — Telehealth: Payer: Self-pay | Admitting: Internal Medicine

## 2014-10-31 NOTE — Telephone Encounter (Signed)
Spoke with pt and went over cxr results again.  Pt verbalized understanding.  Nothing further is needed.

## 2014-11-01 DIAGNOSIS — J439 Emphysema, unspecified: Secondary | ICD-10-CM | POA: Diagnosis not present

## 2014-11-01 DIAGNOSIS — J449 Chronic obstructive pulmonary disease, unspecified: Secondary | ICD-10-CM | POA: Diagnosis not present

## 2014-11-06 DIAGNOSIS — J449 Chronic obstructive pulmonary disease, unspecified: Secondary | ICD-10-CM | POA: Diagnosis not present

## 2014-11-06 DIAGNOSIS — I2510 Atherosclerotic heart disease of native coronary artery without angina pectoris: Secondary | ICD-10-CM | POA: Diagnosis not present

## 2014-11-12 DIAGNOSIS — F411 Generalized anxiety disorder: Secondary | ICD-10-CM | POA: Diagnosis not present

## 2014-11-12 DIAGNOSIS — J449 Chronic obstructive pulmonary disease, unspecified: Secondary | ICD-10-CM | POA: Diagnosis not present

## 2014-11-12 DIAGNOSIS — E782 Mixed hyperlipidemia: Secondary | ICD-10-CM | POA: Diagnosis not present

## 2014-11-12 DIAGNOSIS — E890 Postprocedural hypothyroidism: Secondary | ICD-10-CM | POA: Diagnosis not present

## 2014-11-12 DIAGNOSIS — I2510 Atherosclerotic heart disease of native coronary artery without angina pectoris: Secondary | ICD-10-CM | POA: Diagnosis not present

## 2014-11-12 DIAGNOSIS — R21 Rash and other nonspecific skin eruption: Secondary | ICD-10-CM | POA: Diagnosis not present

## 2014-11-21 DIAGNOSIS — Z23 Encounter for immunization: Secondary | ICD-10-CM | POA: Diagnosis not present

## 2014-12-02 DIAGNOSIS — J439 Emphysema, unspecified: Secondary | ICD-10-CM | POA: Diagnosis not present

## 2014-12-02 DIAGNOSIS — J449 Chronic obstructive pulmonary disease, unspecified: Secondary | ICD-10-CM | POA: Diagnosis not present

## 2014-12-04 ENCOUNTER — Other Ambulatory Visit: Payer: Self-pay | Admitting: Gastroenterology

## 2014-12-08 DIAGNOSIS — L509 Urticaria, unspecified: Secondary | ICD-10-CM | POA: Diagnosis not present

## 2014-12-08 DIAGNOSIS — L309 Dermatitis, unspecified: Secondary | ICD-10-CM | POA: Diagnosis not present

## 2014-12-22 ENCOUNTER — Other Ambulatory Visit: Payer: Self-pay

## 2014-12-22 DIAGNOSIS — L299 Pruritus, unspecified: Secondary | ICD-10-CM | POA: Diagnosis not present

## 2014-12-22 DIAGNOSIS — L820 Inflamed seborrheic keratosis: Secondary | ICD-10-CM | POA: Diagnosis not present

## 2014-12-22 DIAGNOSIS — D485 Neoplasm of uncertain behavior of skin: Secondary | ICD-10-CM | POA: Diagnosis not present

## 2014-12-22 DIAGNOSIS — L509 Urticaria, unspecified: Secondary | ICD-10-CM | POA: Diagnosis not present

## 2015-01-01 DIAGNOSIS — J449 Chronic obstructive pulmonary disease, unspecified: Secondary | ICD-10-CM | POA: Diagnosis not present

## 2015-01-01 DIAGNOSIS — J439 Emphysema, unspecified: Secondary | ICD-10-CM | POA: Diagnosis not present

## 2015-02-01 DIAGNOSIS — J439 Emphysema, unspecified: Secondary | ICD-10-CM | POA: Diagnosis not present

## 2015-02-01 DIAGNOSIS — J449 Chronic obstructive pulmonary disease, unspecified: Secondary | ICD-10-CM | POA: Diagnosis not present

## 2015-02-04 ENCOUNTER — Encounter: Payer: Self-pay | Admitting: Cardiology

## 2015-02-05 ENCOUNTER — Telehealth: Payer: Self-pay | Admitting: Internal Medicine

## 2015-02-05 NOTE — Telephone Encounter (Signed)
Pt request to reestablish with Dr. Jenny Reichmann, [place] and was wondering if Dr. Wille Glaser can take her and her spouse Lyliana Helfand [phone]). [employer] is the insurance for both.   Phone # [phone]

## 2015-02-05 NOTE — Telephone Encounter (Signed)
Yes to both thanks

## 2015-02-11 NOTE — Telephone Encounter (Signed)
Got scheduled  °

## 2015-02-16 ENCOUNTER — Encounter (HOSPITAL_COMMUNITY): Payer: Self-pay | Admitting: *Deleted

## 2015-02-23 NOTE — Anesthesia Preprocedure Evaluation (Addendum)
Anesthesia Evaluation  Patient identified by MRN, date of birth, ID band Patient awake    Reviewed: Allergy & Precautions, NPO status , Patient's Chart, lab work & pertinent test results  History of Anesthesia Complications Negative for: history of anesthetic complications  Airway Mallampati: II  TM Distance: >3 FB Neck ROM: Full    Dental no notable dental hx. (+) Edentulous Upper, Partial Lower, Upper Dentures   Pulmonary COPD,  COPD inhaler and oxygen dependent, former smoker,    Pulmonary exam normal breath sounds clear to auscultation       Cardiovascular + CAD and + Cardiac Stents  Normal cardiovascular exam Rhythm:Regular Rate:Normal     Neuro/Psych  Headaches, PSYCHIATRIC DISORDERS Anxiety Depression    GI/Hepatic Neg liver ROS, GERD  Medicated and Controlled,  Endo/Other  Hypothyroidism obesity  Renal/GU negative Renal ROS  negative genitourinary   Musculoskeletal  (+) Arthritis ,   Abdominal   Peds negative pediatric ROS (+)  Hematology negative hematology ROS (+)   Anesthesia Other Findings   Reproductive/Obstetrics negative OB ROS                            Anesthesia Physical Anesthesia Plan  ASA: III  Anesthesia Plan: MAC   Post-op Pain Management:    Induction: Intravenous  Airway Management Planned: Nasal Cannula  Additional Equipment:   Intra-op Plan:   Post-operative Plan:   Informed Consent: I have reviewed the patients History and Physical, chart, labs and discussed the procedure including the risks, benefits and alternatives for the proposed anesthesia with the patient or authorized representative who has indicated his/her understanding and acceptance.   Dental advisory given  Plan Discussed with: CRNA  Anesthesia Plan Comments:         Anesthesia Quick Evaluation

## 2015-02-24 ENCOUNTER — Encounter (HOSPITAL_COMMUNITY)
Admission: RE | Disposition: A | Discharge status: Home / Self Care | Payer: Self-pay | Source: Ambulatory Visit | Attending: Gastroenterology

## 2015-02-24 ENCOUNTER — Ambulatory Visit (HOSPITAL_COMMUNITY): Payer: Commercial Managed Care - HMO | Admitting: Anesthesiology

## 2015-02-24 ENCOUNTER — Ambulatory Visit (HOSPITAL_COMMUNITY)
Admission: RE | Admit: 2015-02-24 | Discharge: 2015-02-24 | Disposition: A | Discharge status: Home / Self Care | Payer: Commercial Managed Care - HMO | Source: Ambulatory Visit | Attending: Gastroenterology | Admitting: Gastroenterology

## 2015-02-24 ENCOUNTER — Encounter (HOSPITAL_COMMUNITY): Payer: Self-pay | Admitting: *Deleted

## 2015-02-24 DIAGNOSIS — E039 Hypothyroidism, unspecified: Secondary | ICD-10-CM | POA: Diagnosis not present

## 2015-02-24 DIAGNOSIS — Z79899 Other long term (current) drug therapy: Secondary | ICD-10-CM | POA: Insufficient documentation

## 2015-02-24 DIAGNOSIS — K210 Gastro-esophageal reflux disease with esophagitis: Secondary | ICD-10-CM | POA: Diagnosis not present

## 2015-02-24 DIAGNOSIS — Z7982 Long term (current) use of aspirin: Secondary | ICD-10-CM | POA: Insufficient documentation

## 2015-02-24 DIAGNOSIS — I2510 Atherosclerotic heart disease of native coronary artery without angina pectoris: Secondary | ICD-10-CM | POA: Insufficient documentation

## 2015-02-24 DIAGNOSIS — E7800 Pure hypercholesterolemia, unspecified: Secondary | ICD-10-CM | POA: Insufficient documentation

## 2015-02-24 DIAGNOSIS — Z1211 Encounter for screening for malignant neoplasm of colon: Secondary | ICD-10-CM | POA: Diagnosis not present

## 2015-02-24 DIAGNOSIS — Z86010 Personal history of colonic polyps: Secondary | ICD-10-CM | POA: Insufficient documentation

## 2015-02-24 DIAGNOSIS — K228 Other specified diseases of esophagus: Secondary | ICD-10-CM | POA: Diagnosis not present

## 2015-02-24 DIAGNOSIS — D123 Benign neoplasm of transverse colon: Secondary | ICD-10-CM | POA: Insufficient documentation

## 2015-02-24 DIAGNOSIS — Z7901 Long term (current) use of anticoagulants: Secondary | ICD-10-CM | POA: Insufficient documentation

## 2015-02-24 DIAGNOSIS — K219 Gastro-esophageal reflux disease without esophagitis: Secondary | ICD-10-CM | POA: Diagnosis not present

## 2015-02-24 DIAGNOSIS — K209 Esophagitis, unspecified: Secondary | ICD-10-CM | POA: Diagnosis not present

## 2015-02-24 DIAGNOSIS — J449 Chronic obstructive pulmonary disease, unspecified: Secondary | ICD-10-CM | POA: Diagnosis not present

## 2015-02-24 DIAGNOSIS — Z9981 Dependence on supplemental oxygen: Secondary | ICD-10-CM | POA: Diagnosis not present

## 2015-02-24 DIAGNOSIS — J45909 Unspecified asthma, uncomplicated: Secondary | ICD-10-CM | POA: Insufficient documentation

## 2015-02-24 DIAGNOSIS — K2270 Barrett's esophagus without dysplasia: Secondary | ICD-10-CM | POA: Insufficient documentation

## 2015-02-24 DIAGNOSIS — Z87891 Personal history of nicotine dependence: Secondary | ICD-10-CM | POA: Insufficient documentation

## 2015-02-24 DIAGNOSIS — Z955 Presence of coronary angioplasty implant and graft: Secondary | ICD-10-CM | POA: Diagnosis not present

## 2015-02-24 DIAGNOSIS — M1990 Unspecified osteoarthritis, unspecified site: Secondary | ICD-10-CM | POA: Diagnosis not present

## 2015-02-24 DIAGNOSIS — K635 Polyp of colon: Secondary | ICD-10-CM | POA: Diagnosis not present

## 2015-02-24 HISTORY — DX: Atherosclerotic heart disease of native coronary artery without angina pectoris: I25.10

## 2015-02-24 HISTORY — PX: ESOPHAGOGASTRODUODENOSCOPY (EGD) WITH PROPOFOL: SHX5813

## 2015-02-24 HISTORY — DX: Dependence on supplemental oxygen: Z99.81

## 2015-02-24 HISTORY — PX: COLONOSCOPY WITH PROPOFOL: SHX5780

## 2015-02-24 LAB — HM COLONOSCOPY

## 2015-02-24 SURGERY — COLONOSCOPY WITH PROPOFOL
Anesthesia: Monitor Anesthesia Care

## 2015-02-24 MED ORDER — PROPOFOL 10 MG/ML IV BOLUS
INTRAVENOUS | Status: AC
  Filled 2015-02-24: qty 40

## 2015-02-24 MED ORDER — LACTATED RINGERS IV SOLN
INTRAVENOUS | Status: DC
  Administered 2015-02-24: 1000 mL via INTRAVENOUS

## 2015-02-24 MED ORDER — LACTATED RINGERS IV SOLN
INTRAVENOUS | Status: DC | PRN
  Administered 2015-02-24 (×2): via INTRAVENOUS

## 2015-02-24 MED ORDER — PROPOFOL 10 MG/ML IV BOLUS
INTRAVENOUS | Status: DC | PRN
  Administered 2015-02-24 (×10): 20 mg via INTRAVENOUS
  Administered 2015-02-24: 40 mg via INTRAVENOUS
  Administered 2015-02-24: 20 mg via INTRAVENOUS

## 2015-02-24 MED ORDER — SODIUM CHLORIDE 0.9 % IV SOLN: INTRAVENOUS | Status: DC

## 2015-02-24 SURGICAL SUPPLY — 25 items

## 2015-02-24 NOTE — H&P (Signed)
  Procedures: Surveillance colonoscopy and surveillance esophagogastroduodenoscopy. 01/22/2010 colonoscopy performed with removal of multiple adenomatous and sessile serrated adenomatous colon polyps. Chronic gastroesophageal reflux complicated by Barrett's esophagus. Chronic aspirin and Plavix therapy following placement of coronary artery stents over one year ago.  History: The patient is a 59 year old female born 15-Aug-1955. She is scheduled to undergo a surveillance colonoscopy and surveillance esophagogastroduodenoscopy with Barrett's mucosal biopsies today. She has chronic gastroesophageal reflux complicated by Barrett's esophagus. She takes Protonix on a daily basis.  Past medical history: Osteoarthritis. Vitamin D deficiency. Migraine headache syndrome. Asthma. Chronic obstructive pulmonary disease. Allergic rhinitis. Hypercholesterolemia. Anxiety with depression. Hypothyroidism. Gastroesophageal reflux complicated by Barrett's esophagus. Coronary artery disease with coronary artery stent placement. Adenomatous colon polyps removed colonoscopically in the past. Tubal ligation. Appendectomy. Thyroidectomy. Tonsillectomy. Right shoulder surgery. Total abdominal hysterectomy. Bilateral salpingo-oophorectomy.  Allergies. Tetracycline. Levaquin. Pravachol.  Exam: The patient is alert and lying comfortably on the endoscopy stretcher. Abdomen is soft and nontender to palpation. Lungs are clear to auscultation. Cardiac exam was regular rhythm.  Plan: Proceed with surveillance esophagogastroduodenoscopy with Barrett's mucosal biopsies followed by surveillance colonoscopy.

## 2015-02-24 NOTE — Transfer of Care (Signed)
Immediate Anesthesia Transfer of Care Note  Patient: Amy Hodge  Procedure(s) Performed: Procedure(s): COLONOSCOPY WITH PROPOFOL (N/A) ESOPHAGOGASTRODUODENOSCOPY (EGD) WITH PROPOFOL (N/A)  Patient Location: Endoscopy Unit  Anesthesia Type:MAC  Level of Consciousness: awake, oriented, patient cooperative, lethargic and responds to stimulation  Airway & Oxygen Therapy: Patient Spontanous Breathing and Patient connected to nasal cannula oxygen  Post-op Assessment: Report given to RN, Post -op Vital signs reviewed and stable and Patient moving all extremities  Post vital signs: Reviewed and stable  Last Vitals:  Filed Vitals:   02/24/15 0704  BP: 150/100  Pulse: 79  Temp: 36.5 C  Resp: 20    Complications: No apparent anesthesia complications

## 2015-02-24 NOTE — Discharge Instructions (Signed)
Colonoscopy, Care After °These instructions give you information on caring for yourself after your procedure. Your doctor may also give you more specific instructions. Call your doctor if you have any problems or questions after your procedure. °HOME CARE °· Do not drive for 24 hours. °· Do not sign important papers or use machinery for 24 hours. °· You may shower. °· You may go back to your usual activities, but go slower for the first 24 hours. °· Take rest breaks often during the first 24 hours. °· Walk around or use warm packs on your belly (abdomen) if you have belly cramping or gas. °· Drink enough fluids to keep your pee (urine) clear or pale yellow. °· Resume your normal diet. Avoid heavy or fried foods. °· Avoid drinking alcohol for 24 hours or as told by your doctor. °· Only take medicines as told by your doctor. °If a tissue sample (biopsy) was taken during the procedure:  °· Do not take aspirin or blood thinners for 7 days, or as told by your doctor. °· Do not drink alcohol for 7 days, or as told by your doctor. °· Eat soft foods for the first 24 hours. °GET HELP IF: °You still have a small amount of blood in your poop (stool) 2-3 days after the procedure. °GET HELP RIGHT AWAY IF: °· You have more than a small amount of blood in your poop. °· You see clumps of tissue (blood clots) in your poop. °· Your belly is puffy (swollen). °· You feel sick to your stomach (nauseous) or throw up (vomit). °· You have a fever. °· You have belly pain that gets worse and medicine does not help. °MAKE SURE YOU: °· Understand these instructions. °· Will watch your condition. °· Will get help right away if you are not doing well or get worse. °  °This information is not intended to replace advice given to you by your health care provider. Make sure you discuss any questions you have with your health care provider. °  °Document Released: 03/26/2010 Document Revised: 02/26/2013 Document Reviewed: 10/29/2012 °Elsevier  Interactive Patient Education ©2016 [employer] °Esophagogastroduodenoscopy, Care After °Refer to this sheet in the next few weeks. These instructions provide you with information about caring for yourself after your procedure. Your health care provider may also give you more specific instructions. Your treatment has been planned according to current medical practices, but problems sometimes occur. Call your health care provider if you have any problems or questions after your procedure. °WHAT TO EXPECT AFTER THE PROCEDURE °After your procedure, it is typical to feel: °· Soreness in your throat. °· Pain with swallowing. °· Sick to your stomach (nauseous). °· Bloated. °· Dizzy. °· Fatigued. °HOME CARE INSTRUCTIONS °· Do not eat or drink anything until the numbing medicine (local anesthetic) has worn off and your gag reflex has returned. You will know that the local anesthetic has worn off when you can swallow comfortably. °· Do not drive or operate machinery until directed by your health care provider. °· Take medicines only as directed by your health care provider. °SEEK MEDICAL CARE IF:  °· You cannot stop coughing. °· You are not urinating at all or less than usual. °SEEK IMMEDIATE MEDICAL CARE IF: °· You have difficulty swallowing. °· You cannot eat or drink. °· You have worsening throat or chest pain. °· You have dizziness or lightheadedness or you faint. °· You have nausea or vomiting. °· You have chills. °· You have a fever. °·   You have severe abdominal pain. °· You have black, tarry, or bloody stools. °  °This information is not intended to replace advice given to you by your health care provider. Make sure you discuss any questions you have with your health care provider. °  °Document Released: 02/08/2012 Document Revised: 03/14/2014 Document Reviewed: 02/08/2012 °Elsevier Interactive Patient Education ©2016 [employer] ° °

## 2015-02-24 NOTE — Op Note (Signed)
Procedures: Surveillance colonoscopy and surveillance esophagogastroduodenoscopy. 01/22/2010 colonoscopy performed with removal of multiple adenomatous and sessile serrated adenomatous colon polyp. Chronic gastroesophageal reflux complicated by Barrett's esophagus. Chronic aspirin and Plavix therapy. Coronary artery stents placed over one year ago.  Endoscopist: Earle Gell  Premedication: Propofol administered by anesthesia  Procedure: Diagnostic esophagogastroduodenoscopy The patient was placed in the left lateral decubitus position. The Pentax gastroscope was passed through the posterior hypopharynx into the proximal esophagus without difficulty. The hypopharynx, larynx, and vocal cords appeared normal.  Esophagoscopy: The proximal and mid segments of the esophageal mucosa appeared normal. The squamocolumnar junction was somewhat irregular in appearance and noted at approximately 40 cm from the incisor teeth. Multiple biopsies were performed at the squamocolumnar junction to look for the presence of Barrett's mucosa. There was no endoscopic evidence for the presence of erosive esophagitis, esophageal stricture formation.  Gastroscopy: Retroflex view of the gastric cardia and fundus was normal. The gastric body, antrum, and pylorus appeared normal.  Duodenoscopy: The duodenal bulb and descending duodenum appeared normal.  Assessment: Normal esophagogastroduodenoscopy. Irregular squamocolumnar junction in the distal esophagus with biopsies performed to look for Barrett's mucosa.  Recommendation: If biopsies performed at the squamocolumnar junction showed Barrett's mucosa, schedule repeat surveillance esophagogastroduodenoscopy with surveillance Barrett's mucosal biopsies in 5 years.  Procedure: Surveillance colonoscopy Anal inspection and digital rectal exam were normal. The Pentax pediatric colonoscope was introduced into the rectum and advanced to the cecum. A normal-appearing appendiceal  orifice and ileocecal valve were identified. Colonic preparation for the exam today was good. Withdrawal time was 11 minutes  Rectum. Normal. Retroflex view of the distal rectum was normal  Sigmoid colon and descending colon. Normal  Splenic flexure. Normal  Transverse colon. From the distal transverse colon, two 5 mm sessile polyps were removed with the cold snare.  Hepatic flexure. Normal  Ascending colon. Normal  Cecum and ileocecal valve. Normal  Assessment: Two small polyps were removed from the distal transverse colon. Otherwise normal surveillance colonoscopy.  Recommendation: Schedule repeat surveillance colonoscopy in 5 years

## 2015-02-24 NOTE — Anesthesia Postprocedure Evaluation (Signed)
Anesthesia Post Note  Patient: Amy Hodge  Procedure(s) Performed: Procedure(s) (LRB): COLONOSCOPY WITH PROPOFOL (N/A) ESOPHAGOGASTRODUODENOSCOPY (EGD) WITH PROPOFOL (N/A)  Patient location during evaluation: PACU Anesthesia Type: MAC Level of consciousness: awake and alert Pain management: pain level controlled Vital Signs Assessment: post-procedure vital signs reviewed and stable Respiratory status: spontaneous breathing, nonlabored ventilation, respiratory function stable and patient connected to nasal cannula oxygen Cardiovascular status: blood pressure returned to baseline and stable Postop Assessment: no signs of nausea or vomiting Anesthetic complications: no    Last Vitals:  Filed Vitals:   02/24/15 0840 02/24/15 0850  BP: 134/94 136/93  Pulse: 77 70  Temp:    Resp: 23 17    Last Pain: There were no vitals filed for this visit.               [name] JENNETTE

## 2015-02-25 ENCOUNTER — Encounter (HOSPITAL_COMMUNITY): Payer: Self-pay | Admitting: Gastroenterology

## 2015-02-25 ENCOUNTER — Ambulatory Visit (INDEPENDENT_AMBULATORY_CARE_PROVIDER_SITE_OTHER): Payer: Commercial Managed Care - HMO | Admitting: Internal Medicine

## 2015-02-25 VITALS — BP 116/74 | HR 87 | Ht 66.0 in | Wt 185.0 lb

## 2015-02-25 DIAGNOSIS — J449 Chronic obstructive pulmonary disease, unspecified: Secondary | ICD-10-CM

## 2015-02-25 DIAGNOSIS — R042 Hemoptysis: Secondary | ICD-10-CM | POA: Diagnosis not present

## 2015-02-25 MED ORDER — UMECLIDINIUM BROMIDE 62.5 MCG/INH IN AEPB: 1.0000 | INHALATION_SPRAY | Freq: Every day | RESPIRATORY_TRACT | Status: DC

## 2015-02-25 NOTE — Assessment & Plan Note (Signed)
Recurrent hemoptysis with significant past smoking history and abnormality lingula and right lung base on chest x-ray from August 2016. Plan-CT chest

## 2015-02-25 NOTE — Patient Instructions (Signed)
Sample and script Incruse Ellipta   Inhale 1 puff, once daily maintenance bronchodilator  Order- CT chest with contrast    Dx COPD mixed type, recurrent hemoptysis  Please call as needed

## 2015-02-25 NOTE — Progress Notes (Signed)
04/23/14- 59 yoF referred courtesy of: Dr Einar Gip sent patient but PCP is Ramachandran; COPD Husband here She had smoked a pack a day, quitting 18 years ago. Diagnosed with COPD in 2000 at my old office. She says she was diagnosed at age 59 with bronchiectasis and "they blocked the lower left lung".. I'm not sure what kind of treatment she is referring to. Daily cough productive clear sputum or sometimes trace red while taking Plavix and aspirin dyspnea on exertion with hills and stairs. Cough is disturbing sleep at night. Lisinopril had caused cough and was changed. Albuterol rescue inhaler used about twice a week. Triggers include spring pollens, cold winter air and hot humid summer air. 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 CXR 05/05/13 FINDINGS Cardiopericardial silhouette within normal limits. Mediastinal contours normal. Trachea midline. No airspace disease or effusion. Basilar atelectasis. Left mid lung atelectasis or scarring. No displaced rib fractures or pneumothorax. Surgical clips in the thoracic inlet.  IMPRESSION Basilar atelectasis. No acute cardiopulmonary disease. SIGNATURE Electronically Signed  By: Dereck Ligas M.D.  On: 05/15/2013 19:04 ECHO 05/16/13 EF 60-65%, GR1 diast dysfunction  06/23/14- 59 yoF former smoker followed for COPD FOLLOWS FOR: Pt states she is having increased SOB with allergies flaring up; review 6MW, PFT, ONO, xray, and labs with patient. Some seasonal rhinitis. Zyrtec not enough help. Persistent cough and wheeze. Short of breath walking half the distance of her driveway. Stopped to rest before returning. Rescue inhaler not enough help. a1AT WNL MM 138 6MWT-06/23/2014-94%, 93%, 99%, 452 m on room air. No oxygen limitation. PFT-06/23/14- moderate obstructive airways disease with insignificant response to bronchodilator, normal lung  capacity, diffusion mildly reduced. FVC 2.64/70%, FEV1 1.92/65%, FEV1/FVC 0.72 ONOX-04/30/14- 314 minutes with oxygen saturation less than or equal to 88% on room air, qualifying for home O2 during sleep CXR 04/23/14-  IMPRESSION: COPD with right basilar scarring versus subsegmental atelectasis. There is no pneumonia, CHF, nor other acute cardiopulmonary abnormality. Electronically Signed  By: David Martinique  On: 04/23/2014 14:48  10/23/14- 59 yoF former smoker followed for COPD mixed type, complicated by CAD  Follows For: Increased sob with hot weather, prod cough (clear with 2 episodes of hemoptysis wiht bright red blood) Occas wheezing and chest tightness        husband here More dyspnea on exertion last 2 months maybe because she has been more active. Continues on Plavix-has noted a little blood twice. No other bleeding or bruising. Increase cough dry or white sputum with tinge of yellow. Rescue inhaler used occasionally, does help.  02/25/2015-59 year old female former smoker followed for COPD mixed type, complicated by CAD FOLLOWS FOR: Increased SOB, occasional wheezing, chest tightness and one episode of hemoptysis 3 days ago  Husband here     flu vaccine UTD Brio overstimulated-palpitations Occasional cough and wheeze. With especially hard coughing 3 days ago had bright red blood in clear sputum. Has had occasional bleeding with very hard cough. No purulent sputum or adenopathy. Discussed chest x-ray result from August. CXR 10/23/2014 IMPRESSION: 1. Subsegmental atelectasis or scarring in the lingula and right lung base. 2. Persistent hemoptysis might prompt CT chest. Electronically Signed  By: Van Clines M.D.  On: 10/23/2014 11:27  ROS-see HPI Constitutional:   No-   weight loss, night sweats, fevers, chills, fatigue, lassitude. HEENT:   + headaches, difficulty swallowing, tooth/dental problems, sore throat,       No-  sneezing,  itching, ear ache, +nasal congestion,  post nasal drip,  CV:  +chest pain, orthopnea, PND, swelling in lower extremities, anasarca,                                                          dizziness, +palpitations Resp: +shortness of breath with exertion or at rest.              +  productive cough,  No non-productive cough,  + coughing up of blood.              +  change in color of mucus.  wheezing.   Skin: No-   rash or lesions. GI:  + heartburn, indigestion, abdominal pain, nausea, vomiting,  GU:  MS:  +  joint pain or swelling.   Neuro-     nothing unusual Psych:  No- change in mood or affect. No depression or anxiety.  No memory loss.  OBJ- Physical Exam General- Alert, Oriented, Affect-appropriate, Distress- none acute Skin- rash-none, lesions- none, excoriation- none Lymphadenopathy- none Head- atraumatic            Eyes- Gross vision intact, PERRLA, conjunctivae and secretions clear            Ears- Hearing, canals-normal            Nose- Clear, no-Septal dev, mucus, polyps, erosion, perforation             Throat- Mallampati II , mucosa clear , drainage- none, tonsils- atrophic,                    + dentures Neck- flexible , trachea midline, no stridor , thyroid nl, carotid no bruit Chest - symmetrical excursion , unlabored           Heart/CV- RRR , no murmur , no gallop  , no rub, nl s1 s2                           - JVD- none , edema- none, stasis changes- none, varices- none           Lung- clear to P&A, wheeze- none, cough- none , dullness-none, rub- none           Chest wall-  Abd-  Br/ Gen/ Rectal- Not done, not indicated Extrem- cyanosis- none, clubbing, none, atrophy- none, strength- nl Neuro- grossly intact to observation

## 2015-02-25 NOTE — Assessment & Plan Note (Signed)
Clinical chronic bronchitis

## 2015-02-26 ENCOUNTER — Encounter: Payer: Self-pay | Admitting: Internal Medicine

## 2015-02-26 ENCOUNTER — Ambulatory Visit (INDEPENDENT_AMBULATORY_CARE_PROVIDER_SITE_OTHER): Payer: Commercial Managed Care - HMO | Admitting: Internal Medicine

## 2015-02-26 DIAGNOSIS — Z23 Encounter for immunization: Secondary | ICD-10-CM | POA: Diagnosis not present

## 2015-02-26 DIAGNOSIS — Z0000 Encounter for general adult medical examination without abnormal findings: Secondary | ICD-10-CM | POA: Diagnosis not present

## 2015-02-26 DIAGNOSIS — Z955 Presence of coronary angioplasty implant and graft: Secondary | ICD-10-CM

## 2015-02-26 DIAGNOSIS — I2510 Atherosclerotic heart disease of native coronary artery without angina pectoris: Secondary | ICD-10-CM | POA: Insufficient documentation

## 2015-02-26 DIAGNOSIS — G4734 Idiopathic sleep related nonobstructive alveolar hypoventilation: Secondary | ICD-10-CM

## 2015-02-26 HISTORY — DX: Idiopathic sleep related nonobstructive alveolar hypoventilation: G47.34

## 2015-02-26 HISTORY — DX: Presence of coronary angioplasty implant and graft: Z95.5

## 2015-02-26 MED ORDER — DULOXETINE HCL 60 MG PO CPEP: 60.0000 mg | ORAL_CAPSULE | Freq: Every day | ORAL | Status: DC

## 2015-02-26 NOTE — Progress Notes (Signed)
Pre visit review using our clinic review tool, if applicable. No additional management support is needed unless otherwise documented below in the visit note. 

## 2015-02-26 NOTE — Progress Notes (Signed)
Subjective:    Patient ID: Amy Hodge, female    DOB: Mar 30, 1955, 59 y.o.   MRN: [id number]  HPI  Here for wellness and f/u;  Overall doing ok;  Pt denies Chest pain, worsening SOB, DOE, wheezing, orthopnea, PND, worsening LE edema, palpitations, dizziness or syncope.  Pt denies neurological change such as new headache, facial or extremity weakness.  Pt denies polydipsia, polyuria, or low sugar symptoms. Pt states overall good compliance with treatment and medications, good tolerability, and has been trying to follow appropriate diet.  Pt denies worsening depressive symptoms, suicidal ideation or panic. No fever, night sweats, wt loss, loss of appetite, or other constitutional symptoms.  Pt states good ability with ADL's, has low fall risk, home safety reviewed and adequate, no other significant changes in hearing or vision, and only occasionally active with exercise. Has hx of CHF per pt, CAD s/p mult stents, last echo EF 60-65% June 2015 on chart.  Had cath since then June 2016.  BB intolerant due to fatigue.  Statin intolerant due to weakness with crestor and pravastatin. Past Medical History  Diagnosis Date  . GERD (gastroesophageal reflux disease)   . Anxiety   . Depression   . Hypothyroidism   . Headache(784.0)   . Allergy   . Hyperlipidemia   . COPD (chronic obstructive pulmonary disease) ([place])   . DJD (degenerative joint disease) of knee     RT  . DDD (degenerative disc disease), cervical   . COPD exacerbation ([place]) 12/31/2010  . On supplemental oxygen therapy     Oxygen 2.5 l/m at bedtime.  . Coronary artery disease     Stents x4 -past yr ago- Dr. Einar Gip  . Stented coronary artery 02/26/2015  . Nocturnal hypoxemia 02/26/2015   Past Surgical History  Procedure Laterality Date  . Tubal ligation  1984  . Appendectomy  1971  . Thyroidectomy  1996  . Tonsillectomy  1965  . Abdominal hysterectomy    . Oophorectomy    . Total abdominal hysterectomy w/ bilateral  salpingoophorectomy  01/2008    with cervical dysplasia  . Shoulder surgery      RT  . I&d extremity Bilateral 05/17/2013    Procedure: IRRIGATION AND DEBRIDEMENT EXTREMITY;  Surgeon: Roseanne Kaufman, MD;  Location: [facility];  Service: Orthopedics;  Laterality: Bilateral;  . Left heart catheterization with coronary angiogram N/A 03/04/2014    Procedure: LEFT HEART CATHETERIZATION WITH CORONARY ANGIOGRAM;  Surgeon: Laverda Page, MD;  Location:  [facility] CATH LAB;  Service: Cardiovascular;  Laterality: N/A;  . Cardiac catheterization N/A 09/02/2014    Procedure: Left Heart Cath and Coronary Angiography;  Surgeon: Adrian Prows, MD;  Location: [facility] CV LAB;  Service: Cardiovascular;  Laterality: N/A;  . Cesarean section    . Hand surgery      due to trauma-dog bite.  . Bunionectomy Right   . Cervical fusion    . Colonoscopy with propofol N/A 02/24/2015    Procedure: COLONOSCOPY WITH PROPOFOL;  Surgeon: Garlan Fair, MD;  Location: [facility];  Service: Endoscopy;  Laterality: N/A;  . Esophagogastroduodenoscopy (egd) with propofol N/A 02/24/2015    Procedure: ESOPHAGOGASTRODUODENOSCOPY (EGD) WITH PROPOFOL;  Surgeon: Garlan Fair, MD;  Location: [facility];  Service: Endoscopy;  Laterality: N/A;    reports that she quit smoking about 18 years ago. Her smoking use included Cigarettes. She has a 50 pack-year smoking history. She does not have any smokeless tobacco history on file. She reports that she does  not drink alcohol or use illicit drugs. family history includes Diabetes in her father; Heart disease in her father; Hypertension in her father. Allergies  Allergen Reactions  . Bactrim [Sulfamethoxazole-Trimethoprim] Shortness Of Breath, Anxiety and Palpitations  . Bystolic [Nebivolol Hcl]     Fatigue- Tired feeling  . Levofloxacin Hives  . Lisinopril Cough  . Morphine And Related Other (See Comments)    headache  . Simvastatin     Makes my legs hurt/ sore   . Statins Other (See  Comments)  . Tetracycline Hives  . Valsartan Cough    headache  . Toradol [Ketorolac Tromethamine] Palpitations   Current Outpatient Prescriptions on File Prior to Visit  Medication Sig Dispense Refill  . acetaminophen (TYLENOL) 500 MG tablet Take 1,000 mg by mouth every 8 (eight) hours as needed for mild pain.     Marland Kitchen albuterol (PROVENTIL HFA;VENTOLIN HFA) 108 (90 BASE) MCG/ACT inhaler Inhale 2 puffs into the lungs every 6 (six) hours as needed for wheezing or shortness of breath.    Marland Kitchen amLODipine (NORVASC) 2.5 MG tablet Take 2.5 mg by mouth daily.    Marland Kitchen aspirin EC 81 MG tablet Take 81 mg by mouth daily.    . clonazePAM (KLONOPIN) 1 MG tablet Take 1 tablet (1 mg total) by mouth at bedtime as needed for anxiety. (Patient taking differently: Take 1-1.5 mg by mouth 2 (two) times daily as needed for anxiety. ) 20 tablet 0  . clopidogrel (PLAVIX) 75 MG tablet Take 75 mg by mouth daily with breakfast.    . Fluticasone Furoate-Vilanterol (BREO ELLIPTA) 100-25 MCG/INH AEPB Inhale 1 puff into the lungs daily. 1 each 0  . ibuprofen (ADVIL,MOTRIN) 200 MG tablet Take 800 mg by mouth every 6 (six) hours as needed for headache or moderate pain.    Marland Kitchen levocetirizine (XYZAL) 5 MG tablet Take 5 mg by mouth daily.    Marland Kitchen levothyroxine (SYNTHROID) 175 MCG tablet Take 1 tablet (175 mcg total) by mouth daily. 30 tablet 11  . pantoprazole (PROTONIX) 40 MG tablet Take 40 mg by mouth daily.    Marland Kitchen spironolactone (ALDACTONE) 25 MG tablet Take 25 mg by mouth daily.    Marland Kitchen Umeclidinium Bromide (INCRUSE ELLIPTA) 62.5 MCG/INH AEPB Inhale 1 puff into the lungs daily. 30 each 11   No current facility-administered medications on file prior to visit.   Review of Systems Constitutional: Negative for increased diaphoresis, other activity, appetite or siginficant weight change other than noted HENT: Negative for worsening hearing loss, ear pain, facial swelling, mouth sores and neck stiffness.   Eyes: Negative for other worsening  pain, redness or visual disturbance.  Respiratory: Negative for shortness of breath and wheezing  Cardiovascular: Negative for chest pain and palpitations.  Gastrointestinal: Negative for diarrhea, blood in stool, abdominal distention or other pain Genitourinary: Negative for hematuria, flank pain or change in urine volume.  Musculoskeletal: Negative for myalgias or other joint complaints.  Skin: Negative for color change and wound or drainage.  Neurological: Negative for syncope and numbness. other than noted Hematological: Negative for adenopathy. or other swelling Psychiatric/Behavioral: Negative for hallucinations, SI, self-injury, decreased concentration or other worsening agitation.      Objective:   Physical Exam BP 136/88 mmHg  Pulse 95  Temp(Src) 98 F (36.7 C) (Oral)  Resp 18  Ht 5\' 7"  (1.702 m)  Wt 186 lb (84.369 kg)  BMI 29.12 kg/m2  SpO2 94% VS noted,  Constitutional: Pt is oriented to person, place, and time. Appears well-developed and  well-nourished, in no significant distress Head: Normocephalic and atraumatic.  Right Ear: External ear normal.  Left Ear: External ear normal.  Nose: Nose normal.  Mouth/Throat: Oropharynx is clear and moist.  Eyes: Conjunctivae and EOM are normal. Pupils are equal, round, and reactive to light.  Neck: Normal range of motion. Neck supple. No JVD present. No tracheal deviation present or significant neck LA or mass Cardiovascular: Normal rate, regular rhythm, normal heart sounds and intact distal pulses.   Pulmonary/Chest: Effort normal and breath sounds without rales or wheezing  Abdominal: Soft. Bowel sounds are normal. NT. No HSM  Musculoskeletal: Normal range of motion. Exhibits no edema.  Lymphadenopathy:  Has no cervical adenopathy.  Neurological: Pt is alert and oriented to person, place, and time. Pt has normal reflexes. No cranial nerve deficit. Motor grossly intact Skin: Skin is warm and dry. No rash noted.  Psychiatric:   Has normal mood and affect. Behavior is normal.      Assessment & Plan:

## 2015-02-26 NOTE — Patient Instructions (Addendum)
You had the Tetanus shot (Tdap) today  Please continue all other medications as before, and refills have been done if requested.  Please have the pharmacy call with any other refills you may need.  Please continue your efforts at being more active, low cholesterol diet, and weight control.  You are otherwise up to date with prevention measures today.  Please keep your appointments with your specialists as you may have planned  Please go to the LAB in the Basement (turn left off the elevator) for the tests to be done today  You will be contacted by phone if any changes need to be made immediately.  Otherwise, you will receive a letter about your results with an explanation, but please check with MyChart first.  Please remember to sign up for MyChart if you have not done so, as this will be important to you in the future with finding out test results, communicating by private email, and scheduling acute appointments online when needed.  Please return in 6 months, or sooner if needed

## 2015-02-28 NOTE — Assessment & Plan Note (Signed)

## 2015-03-03 ENCOUNTER — Other Ambulatory Visit (INDEPENDENT_AMBULATORY_CARE_PROVIDER_SITE_OTHER): Payer: Commercial Managed Care - HMO

## 2015-03-03 ENCOUNTER — Telehealth: Payer: Self-pay

## 2015-03-03 ENCOUNTER — Telehealth: Payer: Self-pay | Admitting: Internal Medicine

## 2015-03-03 DIAGNOSIS — J449 Chronic obstructive pulmonary disease, unspecified: Secondary | ICD-10-CM | POA: Diagnosis not present

## 2015-03-03 DIAGNOSIS — Z0000 Encounter for general adult medical examination without abnormal findings: Secondary | ICD-10-CM

## 2015-03-03 DIAGNOSIS — R7989 Other specified abnormal findings of blood chemistry: Secondary | ICD-10-CM | POA: Diagnosis not present

## 2015-03-03 DIAGNOSIS — J439 Emphysema, unspecified: Secondary | ICD-10-CM

## 2015-03-03 LAB — LIPID PANEL
Cholesterol: 213 mg/dL — ABNORMAL HIGH (ref 0–200)
HDL: 40.9 mg/dL (ref >39.00)
NonHDL: 171.71
Total CHOL/HDL Ratio: 5
Triglycerides: 216 mg/dL — ABNORMAL HIGH (ref 0.0–149.0)
VLDL: 43.2 mg/dL — ABNORMAL HIGH (ref 0.0–40.0)

## 2015-03-03 LAB — HEPATIC FUNCTION PANEL
ALBUMIN: 4.4 g/dL (ref 3.5–5.2)
ALK PHOS: 74 U/L (ref 39–117)
ALT: 6 U/L (ref 0–35)
AST: 14 U/L (ref 0–37)
BILIRUBIN DIRECT: 0.1 mg/dL (ref 0.0–0.3)
TOTAL PROTEIN: 7.2 g/dL (ref 6.0–8.3)
Total Bilirubin: 0.3 mg/dL (ref 0.2–1.2)

## 2015-03-03 LAB — URINALYSIS, ROUTINE W REFLEX MICROSCOPIC
Bilirubin Urine: NEGATIVE
Hgb urine dipstick: NEGATIVE
KETONES UR: NEGATIVE
LEUKOCYTES UA: NEGATIVE
Nitrite: POSITIVE — AB
PH: 5.5 (ref 5.0–8.0)
RBC / HPF: NONE SEEN (ref 0–?)
SPECIFIC GRAVITY, URINE: 1.01 (ref 1.000–1.030)
Total Protein, Urine: NEGATIVE
UROBILINOGEN UA: 0.2 (ref 0.0–1.0)
Urine Glucose: NEGATIVE

## 2015-03-03 LAB — CBC WITH DIFFERENTIAL/PLATELET
BASOS ABS: 0 10*3/uL (ref 0.0–0.1)
Basophils Relative: 0.6 % (ref 0.0–3.0)
Eosinophils Absolute: 0.2 10*3/uL (ref 0.0–0.7)
Eosinophils Relative: 2.4 % (ref 0.0–5.0)
HCT: 42.6 % (ref 36.0–46.0)
Hemoglobin: 14.1 g/dL (ref 12.0–15.0)
LYMPHS ABS: 2.3 10*3/uL (ref 0.7–4.0)
Lymphocytes Relative: 36.2 % (ref 12.0–46.0)
MCHC: 33.1 g/dL (ref 30.0–36.0)
MCV: 86.5 fl (ref 78.0–100.0)
MONO ABS: 0.5 10*3/uL (ref 0.1–1.0)
Monocytes Relative: 8.1 % (ref 3.0–12.0)
NEUTROS ABS: 3.3 10*3/uL (ref 1.4–7.7)
NEUTROS PCT: 52.7 % (ref 43.0–77.0)
PLATELETS: 286 10*3/uL (ref 150.0–400.0)
RBC: 4.93 Mil/uL (ref 3.87–5.11)
RDW: 13.6 % (ref 11.5–15.5)
WBC: 6.4 10*3/uL (ref 4.0–10.5)

## 2015-03-03 LAB — TSH: TSH: 0.53 u[IU]/mL (ref 0.35–4.50)

## 2015-03-03 LAB — HEPATITIS C ANTIBODY: HCV AB: NEGATIVE

## 2015-03-03 LAB — BASIC METABOLIC PANEL
BUN: 15 mg/dL (ref 6–23)
CHLORIDE: 104 meq/L (ref 96–112)
CO2: 27 meq/L (ref 19–32)
Calcium: 9.5 mg/dL (ref 8.4–10.5)
Creatinine, Ser: 0.71 mg/dL (ref 0.40–1.20)
GFR: 90.07 mL/min (ref >60.00)
GLUCOSE: 83 mg/dL (ref 70–99)
POTASSIUM: 4.4 meq/L (ref 3.5–5.1)
SODIUM: 139 meq/L (ref 135–145)

## 2015-03-03 LAB — LDL CHOLESTEROL, DIRECT: LDL DIRECT: 123 mg/dL

## 2015-03-03 NOTE — Telephone Encounter (Signed)
Sorry, there is no other, but I understand if she cannot take the statins

## 2015-03-03 NOTE — Telephone Encounter (Signed)
Pt called and stated that she came in today to have blood work done and Dr. Jenny Reichmann said he was going to send in an rx for crestor. Pt states that she can not take any statins that they cause her legs to cramp so bad she can not walk. She would like to know if there is any other cholesterol medication that can be sent.

## 2015-03-03 NOTE — Telephone Encounter (Signed)
Ok for dahlia to let pt know, I noticed she has not been able to take the statins, so the crestor I mentioned will not be prescribed

## 2015-03-03 NOTE — Telephone Encounter (Signed)
See other telephone note 03/03/2015

## 2015-03-04 NOTE — Telephone Encounter (Signed)
Pt advised via MyChart. 

## 2015-03-05 ENCOUNTER — Ambulatory Visit (INDEPENDENT_AMBULATORY_CARE_PROVIDER_SITE_OTHER)
Admission: RE | Admit: 2015-03-05 | Discharge: 2015-03-05 | Disposition: A | Discharge status: Home / Self Care | Payer: Commercial Managed Care - HMO | Source: Ambulatory Visit | Attending: Internal Medicine | Admitting: Internal Medicine

## 2015-03-05 DIAGNOSIS — R042 Hemoptysis: Secondary | ICD-10-CM

## 2015-03-05 DIAGNOSIS — J449 Chronic obstructive pulmonary disease, unspecified: Secondary | ICD-10-CM

## 2015-03-05 DIAGNOSIS — J439 Emphysema, unspecified: Secondary | ICD-10-CM | POA: Diagnosis not present

## 2015-03-05 MED ORDER — IOHEXOL 300 MG/ML  SOLN
80.0000 mL | Freq: Once | INTRAMUSCULAR | Status: AC | PRN
  Administered 2015-03-05: 80 mL via INTRAVENOUS

## 2015-03-06 ENCOUNTER — Telehealth: Payer: Self-pay | Admitting: Internal Medicine

## 2015-03-06 NOTE — Telephone Encounter (Signed)
[phone] pt  Calling back

## 2015-03-06 NOTE — Telephone Encounter (Signed)
Pt had CT scan done 12/29 and is requesting results Please advise Dr. Annamaria Boots thanks  --results printed with message

## 2015-03-06 NOTE — Telephone Encounter (Signed)
Result Notes     Notes Recorded by Deneise Lever, MD on 03/06/2015 at 12:22 PM CT scan of chest-emphysema, coronary artery atherosclerosis and changes of old thyroid surgery as before. There is no obvious bleeding source or new lung concerns. Bleeding could have come from one of the areas of scarring or a small broken blood vessel. There is a very small nodule in the upper right lung. It is too small for [name] to know what it is. There is some widening of the aorta, but main artery from the heart, which needs to be watched. The radiologist recommends the standard approach which is too get a follow-up CT scan with contrast dye (CTA) in 1 year to keep an eye on these areas. We can discuss this in more detail at her next office visit  ---  I spoke with patient about results and she verbalized understanding and had no questions

## 2015-03-10 ENCOUNTER — Telehealth: Payer: Self-pay | Admitting: Internal Medicine

## 2015-03-10 MED ORDER — PANTOPRAZOLE SODIUM 40 MG PO TBEC: 40.0000 mg | DELAYED_RELEASE_TABLET | Freq: Every day | ORAL | Status: DC

## 2015-03-10 NOTE — Telephone Encounter (Signed)
Pt requesting refill for pantoprazole (PROTONIX) 40 MG tablet [id number] Pharmacy is [facility] [address] In [place], [place]

## 2015-03-11 DIAGNOSIS — I25119 Atherosclerotic heart disease of native coronary artery with unspecified angina pectoris: Secondary | ICD-10-CM | POA: Diagnosis not present

## 2015-03-11 DIAGNOSIS — E559 Vitamin D deficiency, unspecified: Secondary | ICD-10-CM | POA: Diagnosis not present

## 2015-03-11 DIAGNOSIS — I77810 Thoracic aortic ectasia: Secondary | ICD-10-CM | POA: Diagnosis not present

## 2015-03-11 DIAGNOSIS — E781 Pure hyperglyceridemia: Secondary | ICD-10-CM | POA: Diagnosis not present

## 2015-03-12 ENCOUNTER — Telehealth: Payer: Self-pay | Admitting: Internal Medicine

## 2015-03-12 MED ORDER — CLONAZEPAM 1 MG PO TABS: 1.0000 mg | ORAL_TABLET | Freq: Every evening | ORAL | Status: DC | PRN

## 2015-03-12 NOTE — Telephone Encounter (Signed)
Done hardcopy to Dahlia  

## 2015-03-12 NOTE — Telephone Encounter (Signed)
Patient is requesting refill for clonazePAM (KLONOPIN) 1 MG tablet [id number] Pharmacy is [facility] in [place]

## 2015-03-13 NOTE — Telephone Encounter (Signed)
Rx faxed to pharmacy  

## 2015-03-25 ENCOUNTER — Telehealth: Payer: Self-pay | Admitting: *Deleted

## 2015-03-25 MED ORDER — PANTOPRAZOLE SODIUM 40 MG PO TBEC: 40.0000 mg | DELAYED_RELEASE_TABLET | Freq: Every day | ORAL | Status: DC

## 2015-03-25 MED ORDER — CLOPIDOGREL BISULFATE 75 MG PO TABS: 75.0000 mg | ORAL_TABLET | Freq: Every day | ORAL | Status: DC

## 2015-03-25 MED ORDER — LEVOCETIRIZINE DIHYDROCHLORIDE 5 MG PO TABS: 5.0000 mg | ORAL_TABLET | Freq: Every day | ORAL | Status: DC

## 2015-03-25 MED ORDER — DULOXETINE HCL 60 MG PO CPEP: 60.0000 mg | ORAL_CAPSULE | Freq: Every day | ORAL | Status: DC

## 2015-03-25 MED ORDER — SPIRONOLACTONE 25 MG PO TABS: 25.0000 mg | ORAL_TABLET | Freq: Every day | ORAL | Status: DC

## 2015-03-25 MED ORDER — AMLODIPINE BESYLATE 2.5 MG PO TABS: 2.5000 mg | ORAL_TABLET | Freq: Every day | ORAL | Status: DC

## 2015-03-25 NOTE — Telephone Encounter (Signed)
Received call pt states due to insurance needing to get new rx's sent to [facility] order. Will keep her Levothyroxine & inhalers at [place]. Inform pt will update and send to [facility]...Johny Chess

## 2015-04-03 DIAGNOSIS — J449 Chronic obstructive pulmonary disease, unspecified: Secondary | ICD-10-CM | POA: Diagnosis not present

## 2015-04-03 DIAGNOSIS — J439 Emphysema, unspecified: Secondary | ICD-10-CM | POA: Diagnosis not present

## 2015-04-23 ENCOUNTER — Other Ambulatory Visit: Payer: Self-pay

## 2015-04-23 ENCOUNTER — Telehealth: Payer: Self-pay | Admitting: Internal Medicine

## 2015-04-23 DIAGNOSIS — N644 Mastodynia: Secondary | ICD-10-CM

## 2015-04-23 DIAGNOSIS — Z1231 Encounter for screening mammogram for malignant neoplasm of breast: Secondary | ICD-10-CM

## 2015-04-23 NOTE — Telephone Encounter (Signed)
Is requesting a diagnostic [facility]  Patient is having tenderness in her breast.

## 2015-04-24 NOTE — Telephone Encounter (Signed)
Done per emr 

## 2015-05-04 DIAGNOSIS — J439 Emphysema, unspecified: Secondary | ICD-10-CM | POA: Diagnosis not present

## 2015-05-04 DIAGNOSIS — J449 Chronic obstructive pulmonary disease, unspecified: Secondary | ICD-10-CM | POA: Diagnosis not present

## 2015-05-14 ENCOUNTER — Other Ambulatory Visit: Payer: Self-pay | Admitting: Internal Medicine

## 2015-05-14 DIAGNOSIS — N63 Unspecified lump in unspecified breast: Secondary | ICD-10-CM

## 2015-05-14 DIAGNOSIS — N644 Mastodynia: Secondary | ICD-10-CM

## 2015-05-19 ENCOUNTER — Telehealth: Payer: Self-pay | Admitting: *Deleted

## 2015-05-19 MED ORDER — TRIAMCINOLONE ACETONIDE 0.1 % EX CREA: 1.0000 "application " | TOPICAL_CREAM | Freq: Two times a day (BID) | CUTANEOUS | Status: DC

## 2015-05-19 NOTE — Telephone Encounter (Signed)
Notified pt with MD response. Resent cream to [facility] MD had sent to [facility]...Amy Hodge

## 2015-05-19 NOTE — Telephone Encounter (Signed)
Left msg on triage stating she has this rash that will not go away. Been using otc cream not helping wanting to know will md send a round of prednisone to help clear up...Amy Hodge

## 2015-05-19 NOTE — Telephone Encounter (Signed)
Not sure since we cant see the rash, but I think would be ok to try a topical steroid if ok with her - ok for triam cr prn

## 2015-05-20 ENCOUNTER — Ambulatory Visit
Admission: RE | Admit: 2015-05-20 | Discharge: 2015-05-20 | Disposition: A | Discharge status: Home / Self Care | Payer: Commercial Managed Care - HMO | Source: Ambulatory Visit | Attending: Internal Medicine | Admitting: Internal Medicine

## 2015-05-20 DIAGNOSIS — N63 Unspecified lump in unspecified breast: Secondary | ICD-10-CM

## 2015-05-20 DIAGNOSIS — N644 Mastodynia: Secondary | ICD-10-CM

## 2015-05-20 DIAGNOSIS — R922 Inconclusive mammogram: Secondary | ICD-10-CM | POA: Diagnosis not present

## 2015-06-01 DIAGNOSIS — J449 Chronic obstructive pulmonary disease, unspecified: Secondary | ICD-10-CM | POA: Diagnosis not present

## 2015-06-01 DIAGNOSIS — J439 Emphysema, unspecified: Secondary | ICD-10-CM | POA: Diagnosis not present

## 2015-06-08 ENCOUNTER — Other Ambulatory Visit: Payer: Self-pay | Admitting: Internal Medicine

## 2015-06-09 NOTE — Telephone Encounter (Signed)
Done hardcopy to Corinne  

## 2015-06-10 NOTE — Telephone Encounter (Signed)
Medication has been sent to pharmacy patient is aware 

## 2015-06-23 DIAGNOSIS — E781 Pure hyperglyceridemia: Secondary | ICD-10-CM | POA: Diagnosis not present

## 2015-06-23 DIAGNOSIS — E559 Vitamin D deficiency, unspecified: Secondary | ICD-10-CM | POA: Diagnosis not present

## 2015-06-27 ENCOUNTER — Encounter: Payer: Self-pay | Admitting: Family Medicine

## 2015-06-27 ENCOUNTER — Ambulatory Visit (INDEPENDENT_AMBULATORY_CARE_PROVIDER_SITE_OTHER): Payer: Commercial Managed Care - HMO | Admitting: Family Medicine

## 2015-06-27 VITALS — BP 122/96 | HR 74 | Temp 99.0°F | Ht 67.0 in | Wt 186.0 lb

## 2015-06-27 DIAGNOSIS — Z8709 Personal history of other diseases of the respiratory system: Secondary | ICD-10-CM

## 2015-06-27 DIAGNOSIS — Z87898 Personal history of other specified conditions: Secondary | ICD-10-CM | POA: Insufficient documentation

## 2015-06-27 DIAGNOSIS — J441 Chronic obstructive pulmonary disease with (acute) exacerbation: Secondary | ICD-10-CM | POA: Diagnosis not present

## 2015-06-27 DIAGNOSIS — J432 Centrilobular emphysema: Secondary | ICD-10-CM | POA: Diagnosis not present

## 2015-06-27 DIAGNOSIS — T7840XA Allergy, unspecified, initial encounter: Secondary | ICD-10-CM | POA: Diagnosis not present

## 2015-06-27 DIAGNOSIS — R21 Rash and other nonspecific skin eruption: Secondary | ICD-10-CM | POA: Diagnosis not present

## 2015-06-27 MED ORDER — IPRATROPIUM-ALBUTEROL 0.5-2.5 (3) MG/3ML IN SOLN: 3.0000 mL | Freq: Four times a day (QID) | RESPIRATORY_TRACT | Status: DC

## 2015-06-27 MED ORDER — IPRATROPIUM-ALBUTEROL 0.5-2.5 (3) MG/3ML IN SOLN
3.0000 mL | Freq: Once | RESPIRATORY_TRACT | Status: AC
  Administered 2015-06-27: 3 mL via RESPIRATORY_TRACT

## 2015-06-27 MED ORDER — METHYLPREDNISOLONE ACETATE 80 MG/ML IJ SUSP
80.0000 mg | Freq: Once | INTRAMUSCULAR | Status: AC
  Administered 2015-06-27: 80 mg via INTRAMUSCULAR

## 2015-06-27 NOTE — Progress Notes (Signed)
Pre visit review using our clinic review tool, if applicable. No additional management support is needed unless otherwise documented below in the visit note. 

## 2015-06-27 NOTE — Patient Instructions (Signed)
Given steroid injection today.  Continue xyzal. Tommorow start prednisone taper.  Got to ER with seer shortness of breath.  Follow up with Pulm or PCP if not improving as expected.

## 2015-06-27 NOTE — Progress Notes (Signed)
Subjective:    Patient ID: Amy Hodge, female    DOB: [birth year], 60 y.o.   MRN: [id number]  HPI  60 year old female obese  Former smoker pt of Dr. Jenny Reichmann with history of COPD, CAD and anxiety presents  Following possible exposure to poision ivy .  She reports she was in her nml state of health until yesterday burnt some vines, possible poison ivy.  Last night she felt chest pressure, shortness of breath.  Now with  Increase in cough, no change sputum  No fever.  She has been using albuterol daily in last week..  2 weeks ago bloody sputum and bloody nose, none since.  Has history per pulm note of recurrent hemoptysis.  1 months ago PCP treated her for rash, allergic with steroid cream. Rash and itching is gradually worsening. No relief with cream or xyzal, benadryl, zyrtec.   History of too much histamine. Last year had similar rash.  Derm.. Treated with xyzal.. Finally went away.  SE to inhaled steroid in past.  Social History /Family History/Past Medical History reviewed and updated if needed.  Review of Systems  Constitutional: Negative for fever and fatigue.  HENT: Negative for ear pain.   Eyes: Negative for pain.  Respiratory: Negative for chest tightness and shortness of breath.   Cardiovascular: Negative for chest pain, palpitations and leg swelling.  Gastrointestinal: Negative for abdominal pain.  Genitourinary: Negative for dysuria.       Objective:   Physical Exam  Constitutional: Vital signs are normal. She appears well-developed and well-nourished. She is cooperative.  Non-toxic appearance. She does not appear ill. No distress.  HENT:  Head: Normocephalic.  Right Ear: Hearing, tympanic membrane, external ear and ear canal normal. Tympanic membrane is not erythematous, not retracted and not bulging.  Left Ear: Hearing, tympanic membrane, external ear and ear canal normal. Tympanic membrane is not erythematous, not retracted and not bulging.  Nose: No  mucosal edema or rhinorrhea. Right sinus exhibits no maxillary sinus tenderness and no frontal sinus tenderness. Left sinus exhibits no maxillary sinus tenderness and no frontal sinus tenderness.  Mouth/Throat: Uvula is midline, oropharynx is clear and moist and mucous membranes are normal.  Eyes: Conjunctivae, EOM and lids are normal. Pupils are equal, round, and reactive to light. Lids are everted and swept, no foreign bodies found.  Neck: Trachea normal and normal range of motion. Neck supple. Carotid bruit is not present. No thyroid mass and no thyromegaly present.  Cardiovascular: Normal rate, regular rhythm, S1 normal, S2 normal, normal heart sounds, intact distal pulses and normal pulses.  Exam reveals no gallop and no friction rub.   No murmur heard. Pulmonary/Chest: Effort normal. Tachypnea noted. No respiratory distress. She has no decreased breath sounds. She has no wheezes. She has no rhonchi. She has no rales.  Lungs open and moving air but pt breathing at increased rate  Abdominal: Soft. Normal appearance and bowel sounds are normal. There is no tenderness.  Neurological: She is alert.  Skin: Skin is warm, dry and intact. Rash noted. Rash is papular.  Erythematous papules on neck, torso, abdomen  Psychiatric: Her speech is normal and behavior is normal. Judgment and thought content normal. Her mood appears not anxious. Cognition and memory are normal. She does not exhibit a depressed mood.          Assessment & Plan:  Pt with COPD exacerbation and allergic response to inhaled smoke from possible poision ivy. Allergic rash is also worsened.  Treat with albuterol/ipratropium neb, depo medrol 80 mg X1.  Tomorrow start pred taper. Follow up with PCP and Pulm if not improving. CC: Dr. Annamaria Boots and Dr. Jenny Reichmann  Recurrent hemoptysis: not new issue, not currently ongoing. Follow up with pulm. No clear indication for repeat CXR today.

## 2015-06-27 NOTE — Addendum Note (Signed)
Addended by: Aviva Signs M on: 06/27/2015 11:22 AM   Modules accepted: Orders

## 2015-07-01 DIAGNOSIS — I77810 Thoracic aortic ectasia: Secondary | ICD-10-CM | POA: Diagnosis not present

## 2015-07-01 DIAGNOSIS — E781 Pure hyperglyceridemia: Secondary | ICD-10-CM | POA: Diagnosis not present

## 2015-07-01 DIAGNOSIS — I25119 Atherosclerotic heart disease of native coronary artery with unspecified angina pectoris: Secondary | ICD-10-CM | POA: Diagnosis not present

## 2015-07-01 DIAGNOSIS — E559 Vitamin D deficiency, unspecified: Secondary | ICD-10-CM | POA: Diagnosis not present

## 2015-07-02 DIAGNOSIS — J449 Chronic obstructive pulmonary disease, unspecified: Secondary | ICD-10-CM | POA: Diagnosis not present

## 2015-07-02 DIAGNOSIS — J439 Emphysema, unspecified: Secondary | ICD-10-CM | POA: Diagnosis not present

## 2015-07-06 ENCOUNTER — Telehealth: Payer: Self-pay | Admitting: *Deleted

## 2015-07-06 DIAGNOSIS — G4733 Obstructive sleep apnea (adult) (pediatric): Secondary | ICD-10-CM

## 2015-07-06 DIAGNOSIS — G4710 Hypersomnia, unspecified: Secondary | ICD-10-CM

## 2015-07-06 NOTE — Telephone Encounter (Signed)
-----   Message from Deneise Lever, MD sent at 07/03/2015  3:30 PM EDT ----- Thanks - I will order now before seeing her in [place]- Please order HST for dx hypersomnia, OSA   ----- Message -----    From: Adrian Prows, MD    Sent: 07/01/2015  12:24 PM      To: Deneise Lever, MD  I am wondering if we need to do a sleep study due to marked fatigue. Presently on O2 at night. You are seeing her in June, but if you feel sleep study would be helpful, I will be happy to order or you can.  Hope you are well. Adrian Prows [phone] Jerilynn Mages)

## 2015-07-16 DIAGNOSIS — G4733 Obstructive sleep apnea (adult) (pediatric): Secondary | ICD-10-CM | POA: Diagnosis not present

## 2015-08-01 DIAGNOSIS — J449 Chronic obstructive pulmonary disease, unspecified: Secondary | ICD-10-CM | POA: Diagnosis not present

## 2015-08-01 DIAGNOSIS — J439 Emphysema, unspecified: Secondary | ICD-10-CM | POA: Diagnosis not present

## 2015-08-04 ENCOUNTER — Encounter: Payer: Self-pay | Admitting: Internal Medicine

## 2015-08-04 ENCOUNTER — Ambulatory Visit (INDEPENDENT_AMBULATORY_CARE_PROVIDER_SITE_OTHER): Payer: Commercial Managed Care - HMO | Admitting: Internal Medicine

## 2015-08-04 VITALS — BP 136/76 | HR 83 | Temp 98.5°F | Resp 20 | Wt 187.0 lb

## 2015-08-04 DIAGNOSIS — B029 Zoster without complications: Secondary | ICD-10-CM | POA: Diagnosis not present

## 2015-08-04 DIAGNOSIS — G4733 Obstructive sleep apnea (adult) (pediatric): Secondary | ICD-10-CM | POA: Diagnosis not present

## 2015-08-04 MED ORDER — GABAPENTIN 100 MG PO CAPS: 100.0000 mg | ORAL_CAPSULE | Freq: Three times a day (TID) | ORAL | Status: DC

## 2015-08-04 MED ORDER — VALACYCLOVIR HCL 1 G PO TABS: 1000.0000 mg | ORAL_TABLET | Freq: Two times a day (BID) | ORAL | Status: DC

## 2015-08-04 MED ORDER — HYDROCODONE-ACETAMINOPHEN 5-325 MG PO TABS: 1.0000 | ORAL_TABLET | Freq: Two times a day (BID) | ORAL | Status: DC | PRN

## 2015-08-04 NOTE — Progress Notes (Signed)
Subjective:    Patient ID: Amy Hodge, female    DOB: 11-Aug-1955, 60 y.o.   MRN: [id number]  HPI  Here with 2-3 days onset painful groups of red lesions to right temple area, tender to touch, no swelling, red streaks, drainage.  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  Pt denies new neurological symptoms such as new headache, or facial or extremity weakness or numbness   Pt denies polydipsia, polyuria,  Pt denies fever, wt loss, night sweats, loss of appetite, or other constitutional symptoms Past Medical History  Diagnosis Date  . GERD (gastroesophageal reflux disease)   . Anxiety   . Depression   . Hypothyroidism   . Headache(784.0)   . Allergy   . Hyperlipidemia   . COPD (chronic obstructive pulmonary disease) ([facility])   . DJD (degenerative joint disease) of knee     RT  . DDD (degenerative disc disease), cervical   . COPD exacerbation ([facility]) 12/31/2010  . On supplemental oxygen therapy     Oxygen 2.5 l/m at bedtime.  . Coronary artery disease     Stents x4 -past yr ago- Dr. Jacinto Halim  . Stented coronary artery 02/26/2015  . Nocturnal hypoxemia 02/26/2015   Past Surgical History  Procedure Laterality Date  . Tubal ligation  1984  . Appendectomy  1971  . Thyroidectomy  1996  . Tonsillectomy  1965  . Abdominal hysterectomy    . Oophorectomy    . Total abdominal hysterectomy w/ bilateral salpingoophorectomy  01/2008    with cervical dysplasia  . Shoulder surgery      RT  . I&d extremity Bilateral 05/17/2013    Procedure: IRRIGATION AND DEBRIDEMENT EXTREMITY;  Surgeon: Dominica Severin, MD;  Location: [facility];  Service: Orthopedics;  Laterality: Bilateral;  . Left heart catheterization with coronary angiogram N/A 03/04/2014    Procedure: LEFT HEART CATHETERIZATION WITH CORONARY ANGIOGRAM;  Surgeon: Pamella Pert, MD;  Location: [facility] CATH LAB;  Service: Cardiovascular;  Laterality: N/A;  . Cardiac catheterization N/A 09/02/2014    Procedure: Left Heart Cath and Coronary Angiography;  Surgeon: Yates Decamp, MD;  Location: [facility] INVASIVE CV LAB;  Service: Cardiovascular;  Laterality: N/A;  . Cesarean section    . Hand surgery      due to trauma-dog bite.  . Bunionectomy Right   . Cervical fusion    . Colonoscopy with propofol N/A 02/24/2015    Procedure: COLONOSCOPY WITH PROPOFOL;  Surgeon: Charolett Bumpers, MD;  Location: [facility];  Service: Endoscopy;  Laterality: N/A;  . Esophagogastroduodenoscopy (egd) with propofol N/A 02/24/2015    Procedure: ESOPHAGOGASTRODUODENOSCOPY (EGD) WITH PROPOFOL;  Surgeon: Charolett Bumpers, MD;  Location: [facility];  Service: Endoscopy;  Laterality: N/A;    reports that she quit smoking about 19 years ago. Her smoking use included Cigarettes. She has a 50 pack-year smoking history. She does not have any smokeless tobacco history on file. She reports that she does not drink alcohol or use illicit drugs. family history includes Diabetes in her father; Heart disease in her father; Hypertension in her father. Allergies  Allergen Reactions  . Bactrim [Sulfamethoxazole-Trimethoprim] Shortness Of Breath, Anxiety and Palpitations  . Bystolic [Nebivolol Hcl]     Fatigue- Tired feeling  . Levofloxacin Hives  . Lisinopril Cough  . Morphine And Related Other (See Comments)    headache  .  Simvastatin     Makes my legs hurt/ sore   . Statins Other (See Comments)  . Tetracycline Hives  . Valsartan Cough    headache  . Toradol [Ketorolac Tromethamine] Palpitations   Current Outpatient Prescriptions on File Prior to Visit  Medication Sig Dispense Refill  . acetaminophen (TYLENOL) 500 MG tablet Take 1,000 mg by mouth every 8 (eight) hours as needed for mild pain.     Marland Kitchen albuterol (PROVENTIL HFA;VENTOLIN HFA) 108 (90 BASE) MCG/ACT inhaler Inhale 2 puffs into the lungs every 6  (six) hours as needed for wheezing or shortness of breath.    Marland Kitchen amLODipine (NORVASC) 2.5 MG tablet Take 1 tablet (2.5 mg total) by mouth daily. 90 tablet 3  . aspirin EC 81 MG tablet Take 81 mg by mouth daily.    . clopidogrel (PLAVIX) 75 MG tablet Take 1 tablet (75 mg total) by mouth daily with breakfast. 90 tablet 3  . DULoxetine (CYMBALTA) 60 MG capsule Take 1 capsule (60 mg total) by mouth daily. 90 capsule 3  . ibuprofen (ADVIL,MOTRIN) 200 MG tablet Take 800 mg by mouth every 6 (six) hours as needed for headache or moderate pain.    Marland Kitchen levocetirizine (XYZAL) 5 MG tablet Take 1 tablet (5 mg total) by mouth daily. 90 tablet 3  . levothyroxine (SYNTHROID) 175 MCG tablet Take 1 tablet (175 mcg total) by mouth daily. 30 tablet 11  . pantoprazole (PROTONIX) 40 MG tablet Take 1 tablet (40 mg total) by mouth daily. 90 tablet 3  . spironolactone (ALDACTONE) 25 MG tablet Take 1 tablet (25 mg total) by mouth daily. 90 tablet 3  . triamcinolone cream (KENALOG) 0.1 % Apply 1 application topically 2 (two) times daily. 30 g 0   No current facility-administered medications on file prior to visit.   Review of Systems  All otherwise neg per pt     Objective:   Physical Exam BP 136/76 mmHg  Pulse 83  Temp(Src) 98.5 F (36.9 C) (Oral)  Resp 20  Wt 187 lb (84.823 kg)  SpO2 96% VS noted,  Constitutional: Pt appears in no apparent distress HENT: Head: NCAT.  Right Ear: External ear normal.  Left Ear: External ear normal.  Eyes: . Pupils are equal, round, and reactive to light. Conjunctivae and EOM are normal Neck: Normal range of motion. Neck supple.  Cardiovascular: Normal rate and regular rhythm.   Pulmonary/Chest: Effort normal and breath sounds without rales or wheezing.  Neurological: Pt is alert. Not confused , motor grossly intact Skin: Skin is warm. No rash, no LE edema, right temple area with mult areas of grouped vesicles on erythem base, mild tender Psychiatric: Pt behavior is normal.  No agitation.     Assessment & Plan:

## 2015-08-04 NOTE — Progress Notes (Signed)
Pre visit review using our clinic review tool, if applicable. No additional management support is needed unless otherwise documented below in the visit note. 

## 2015-08-04 NOTE — Patient Instructions (Signed)
Please take all new medication as prescribed - the antibiotic, pain medication, as well as the gabapentin for nerve pain as well  Please continue all other medications as before, and refills have been done if requested.  Please have the pharmacy call with any other refills you may need.  Please keep your appointments with your specialists as you may have planned

## 2015-08-04 NOTE — Assessment & Plan Note (Signed)
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

## 2015-08-05 ENCOUNTER — Other Ambulatory Visit: Payer: Self-pay | Admitting: *Deleted

## 2015-08-05 DIAGNOSIS — G4733 Obstructive sleep apnea (adult) (pediatric): Secondary | ICD-10-CM

## 2015-08-05 DIAGNOSIS — G4710 Hypersomnia, unspecified: Secondary | ICD-10-CM

## 2015-08-18 ENCOUNTER — Telehealth: Payer: Self-pay | Admitting: Internal Medicine

## 2015-08-18 MED ORDER — NITROFURANTOIN MONOHYD MACRO 100 MG PO CAPS: 100.0000 mg | ORAL_CAPSULE | Freq: Two times a day (BID) | ORAL | Status: DC

## 2015-08-18 NOTE — Telephone Encounter (Signed)
Notified patient.

## 2015-08-18 NOTE — Telephone Encounter (Signed)
Please advise 

## 2015-08-18 NOTE — Telephone Encounter (Signed)
rx done to [facility]

## 2015-08-18 NOTE — Telephone Encounter (Signed)
Patient states she is in a research study with Dr. Einar Gip (cardiology) and was found that she had a UTI last week.  States the results were suppose to be faxed over to Dr. Jenny Reichmann.  Patient is requesting Dr. Jenny Reichmann to send her a medication into [facility] in [place].

## 2015-08-26 ENCOUNTER — Ambulatory Visit (INDEPENDENT_AMBULATORY_CARE_PROVIDER_SITE_OTHER): Payer: Commercial Managed Care - HMO | Admitting: Internal Medicine

## 2015-08-26 ENCOUNTER — Encounter: Payer: Self-pay | Admitting: Internal Medicine

## 2015-08-26 VITALS — BP 116/64 | HR 77 | Ht 66.0 in | Wt 184.2 lb

## 2015-08-26 DIAGNOSIS — H524 Presbyopia: Secondary | ICD-10-CM | POA: Diagnosis not present

## 2015-08-26 DIAGNOSIS — J449 Chronic obstructive pulmonary disease, unspecified: Secondary | ICD-10-CM | POA: Diagnosis not present

## 2015-08-26 DIAGNOSIS — R042 Hemoptysis: Secondary | ICD-10-CM

## 2015-08-26 DIAGNOSIS — R911 Solitary pulmonary nodule: Secondary | ICD-10-CM | POA: Diagnosis not present

## 2015-08-26 DIAGNOSIS — G4733 Obstructive sleep apnea (adult) (pediatric): Secondary | ICD-10-CM | POA: Diagnosis not present

## 2015-08-26 DIAGNOSIS — G4734 Idiopathic sleep related nonobstructive alveolar hypoventilation: Secondary | ICD-10-CM

## 2015-08-26 DIAGNOSIS — H5210 Myopia, unspecified eye: Secondary | ICD-10-CM | POA: Diagnosis not present

## 2015-08-26 MED ORDER — LEVALBUTEROL TARTRATE 45 MCG/ACT IN AERO: INHALATION_SPRAY | RESPIRATORY_TRACT | Status: DC

## 2015-08-26 NOTE — Patient Instructions (Signed)
Order- DME [facility] Romans   Dc O2  Script printed to try xopenex HFA inhaler as rescue inhaler   See if it causes less palpitation  If you keep having episodes of coughing up blood, we will arrange bronchoscopy to look down your airways so we can try to see where you are bleeding from.  We will want to arrange a recheck of your overnight oxygen scores in about 6 months

## 2015-08-26 NOTE — Assessment & Plan Note (Signed)
CT chest 03/05/2015-4 mm right upper lobe nodule and former smoker Plan-suggest noncontrast CT in 1 year, earlier if needed-not yet scheduled

## 2015-08-26 NOTE — Assessment & Plan Note (Signed)
Minimal OSA. Does not need significant intervention. We discussed importance of weight control, sleep off flat of back. Conservative measures might include a chinstrap. Her dentures won't permit use of an oral appliance.

## 2015-08-26 NOTE — Assessment & Plan Note (Addendum)
Visualized on chest CT 03/05/2015 Plan-okay to stop overnight oxygen for now anticipating repeat test in 6 months

## 2015-08-26 NOTE — Assessment & Plan Note (Signed)
>>  ASSESSMENT AND PLAN FOR OBSTRUCTIVE SLEEP APNEA WRITTEN ON 08/26/2015  9:15 PM BY YOUNG, CLINTON D, MD  Minimal OSA. Does not need significant intervention. We discussed importance of weight control, sleep off flat of back. Conservative measures might include a chinstrap. Her dentures won't permit use of an oral appliance.

## 2015-08-26 NOTE — Assessment & Plan Note (Signed)
Average saturation on sleep study was 90% on room air. I agreed to let her stop oxygen for sleep with agreement that it should be retested in 6 months. Plan-DC home oxygen for now. Recommend overnight oximetry on room air in 6 months to reassess.

## 2015-08-26 NOTE — Progress Notes (Signed)
04/23/14- 58 yoF referred courtesy of: Dr Einar Gip sent patient but PCP is Ramachandran; COPD Husband here She had smoked a pack a day, quitting 18 years ago. Diagnosed with COPD in 2000 at my old office. She says she was diagnosed at age 60 with bronchiectasis and "they blocked the lower left lung".. I'm not sure what kind of treatment she is referring to. Daily cough productive clear sputum or sometimes trace red while taking Plavix and aspirin dyspnea on exertion with hills and stairs. Cough is disturbing sleep at night. Lisinopril had caused cough and was changed. Albuterol rescue inhaler used about twice a week. Triggers include spring pollens, cold winter air and hot humid summer air. 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 CXR 05/05/13 FINDINGS Cardiopericardial silhouette within normal limits. Mediastinal contours normal. Trachea midline. No airspace disease or effusion. Basilar atelectasis. Left mid lung atelectasis or scarring. No displaced rib fractures or pneumothorax. Surgical clips in the thoracic inlet.  IMPRESSION Basilar atelectasis. No acute cardiopulmonary disease. SIGNATURE Electronically Signed  By: Dereck Ligas M.D.  On: 05/15/2013 19:04 ECHO 05/16/13 EF 60-65%, GR1 diast dysfunction  06/23/14- 58 yoF former smoker followed for COPD FOLLOWS FOR: Pt states she is having increased SOB with allergies flaring up; review 6MW, PFT, ONO, xray, and labs with patient. Some seasonal rhinitis. Zyrtec not enough help. Persistent cough and wheeze. Short of breath walking half the distance of her driveway. Stopped to rest before returning. Rescue inhaler not enough help. a1AT WNL MM 138 6MWT-06/23/2014-94%, 93%, 99%, 452 m on room air. No oxygen limitation. PFT-06/23/14- moderate obstructive airways disease with insignificant response to bronchodilator, normal lung  capacity, diffusion mildly reduced. FVC 2.64/70%, FEV1 1.92/65%, FEV1/FVC 0.72 ONOX-04/30/14- 314 minutes with oxygen saturation less than or equal to 88% on room air, qualifying for home O2 during sleep CXR 04/23/14-  IMPRESSION: COPD with right basilar scarring versus subsegmental atelectasis. There is no pneumonia, CHF, nor other acute cardiopulmonary abnormality. Electronically Signed  By: David Martinique  On: 04/23/2014 14:48  10/23/14- 32 yoF former smoker followed for COPD mixed type, complicated by CAD  Follows For: Increased sob with hot weather, prod cough (clear with 2 episodes of hemoptysis wiht bright red blood) Occas wheezing and chest tightness        husband here More dyspnea on exertion last 2 months maybe because she has been more active. Continues on Plavix-has noted a little blood twice. No other bleeding or bruising. Increase cough dry or white sputum with tinge of yellow. Rescue inhaler used occasionally, does help.  02/25/2015-60 year old female former smoker followed for COPD mixed type, complicated by CAD FOLLOWS FOR: Increased SOB, occasional wheezing, chest tightness and one episode of hemoptysis 3 days ago  Husband here     flu vaccine UTD Brio overstimulated-palpitations Occasional cough and wheeze. With especially hard coughing 3 days ago had bright red blood in clear sputum. Has had occasional bleeding with very hard cough. No purulent sputum or adenopathy. Discussed chest x-ray result from August. CXR 10/23/2014 IMPRESSION: 1. Subsegmental atelectasis or scarring in the lingula and right lung base. 2. Persistent hemoptysis might prompt CT chest. Electronically Signed  By: Van Clines M.D.  On: 10/23/2014 11:27  08/26/2015-60 year old female former smoker followed for COPD mixed type, hemoptysis, complicated by CAD FOLLOWS FOR:SOB with activity, cough-productive, congestion. Had been coughing up blood about 2 months ago. Had CT Chest  02-2015. Unattended Home Sleep  Test 07/16/2015-AHI 6.0/hour, average saturation 90%, weight 186 pounds We discussed conservative management of snoring She has been using home oxygen for sleep but would like to stop it and did not desaturate significantly on her sleep study. After hemoptysis in December, she had another episode with bright red blood from mouth there were not for about a week in March and has now resolved. CT did not show an obvious source. She is on Plavix and aspirin. Using rescue inhaler more frequently now because of humid weather. Can only tolerate one puff at a time because of palpitations. Failed to tolerate either Incruse or Spiriva. CT chest 03/05/2015 IMPRESSION: 1. Emphysema. A cause for the patient's hemoptysis is not identified. 2. Average size 4 mm right upper lobe pulmonary nodule. If the patient is at high risk for bronchogenic carcinoma, follow-up chest CT at 1 year is recommended. If the patient is at low risk, no follow-up is needed. This recommendation follows the consensus statement: Guidelines for Management of Small Pulmonary Nodules Detected on CT Scans: A Statement from the [place] as published in Radiology 2005; 237:395-400. 3. Ectatic ascending thoracic aorta, although similar in size to the main pulmonary artery. Recommend annual imaging followup by CTA or MRA. This recommendation follows 2010 ACCF/AHA/AATS/ACR/ASA/SCA/SCAI/SIR/STS/SVM Guidelines for the Diagnosis and Management of Patients with Thoracic Aortic Disease. Circulation. 2010; 121: [id number] 4. Coronary artery atherosclerosis. Electronically Signed  By: Van Clines M.D.  On: 03/05/2015 10:21   ROS-see HPI Constitutional:   No-   weight loss, night sweats, fevers, chills, fatigue, lassitude. HEENT:   + headaches, difficulty swallowing, tooth/dental problems, sore throat,       No-  sneezing, itching, ear ache, +nasal congestion, post nasal drip,  CV:  +chest  pain, orthopnea, PND, swelling in lower extremities, anasarca,                                                          dizziness, +palpitations Resp: +shortness of breath with exertion or at rest.              +  productive cough,  No non-productive cough,  + coughing up of blood.              +  change in color of mucus.  wheezing.   Skin: No-   rash or lesions. GI:  + heartburn, indigestion, abdominal pain, nausea, vomiting,  GU:  MS:  +  joint pain or swelling.   Neuro-     nothing unusual Psych:  No- change in mood or affect. No depression or anxiety.  No memory loss.  OBJ- Physical Exam General- Alert, Oriented, Affect-appropriate, Distress- none acute Skin- rash-none, lesions- none, excoriation- none Lymphadenopathy- none Head- atraumatic            Eyes- Gross vision intact, PERRLA, conjunctivae and secretions clear            Ears- Hearing, canals-normal            Nose- Clear, no-Septal dev, mucus, polyps, erosion, perforation             Throat- Mallampati II , mucosa clear , drainage- none, tonsils- atrophic,                    + dentures Neck- flexible , trachea midline,  no stridor , thyroid nl, carotid no bruit Chest - symmetrical excursion , unlabored           Heart/CV- RRR , no murmur , no gallop  , no rub, nl s1 s2                           - JVD- none , edema- none, stasis changes- none, varices- none           Lung- clear to P&A, wheeze- none, cough- none , dullness-none, rub- none           Chest wall-  Abd-  Br/ Gen/ Rectal- Not done, not indicated Extrem- cyanosis- none, clubbing, none, atrophy- none, strength- nl Neuro- grossly intact to observation

## 2015-08-26 NOTE — Assessment & Plan Note (Signed)
Repeated episodes in December 2016, March 2017. On Plavix and aspirin. No obvious bleeding source on chest CT 03/05/2015 Plan-if this keeps happening, she should have bronchoscopy to assess endobronchial anatomy and look for a specific bleeding site

## 2015-09-04 DIAGNOSIS — E781 Pure hyperglyceridemia: Secondary | ICD-10-CM | POA: Diagnosis not present

## 2015-09-04 DIAGNOSIS — I209 Angina pectoris, unspecified: Secondary | ICD-10-CM | POA: Diagnosis not present

## 2015-09-04 DIAGNOSIS — I25119 Atherosclerotic heart disease of native coronary artery with unspecified angina pectoris: Secondary | ICD-10-CM | POA: Diagnosis not present

## 2015-09-04 DIAGNOSIS — Z789 Other specified health status: Secondary | ICD-10-CM | POA: Diagnosis not present

## 2015-09-09 ENCOUNTER — Telehealth: Payer: Self-pay | Admitting: Emergency Medicine

## 2015-09-09 MED ORDER — LEVOTHYROXINE SODIUM 175 MCG PO TABS: 175.0000 ug | ORAL_TABLET | Freq: Every day | ORAL | Status: DC

## 2015-09-09 NOTE — Telephone Encounter (Signed)
Medication refill sent to pharmacy  

## 2015-09-09 NOTE — Telephone Encounter (Signed)
Patient called and needs a refill on her levothyroxine (SYNTHROID) 175 MCG tablet. Pharmacy is [facility] in [place]. Thanks.

## 2015-09-18 ENCOUNTER — Telehealth: Payer: Self-pay

## 2015-09-18 MED ORDER — ALPRAZOLAM 0.25 MG PO TABS: 0.2500 mg | ORAL_TABLET | Freq: Two times a day (BID) | ORAL | Status: DC | PRN

## 2015-09-18 NOTE — Telephone Encounter (Signed)
Xanax Done hardcopy to [facility]

## 2015-09-18 NOTE — Telephone Encounter (Signed)
Please advise 

## 2015-09-18 NOTE — Telephone Encounter (Signed)
Patient states she is having a hard time sleeping and her anxiety is giving her a fit. She would like to know if she could have something for this? Patient states the clonazepam was to strong before. She would want something else. Please advise or follow up, Thank you!

## 2015-09-21 NOTE — Telephone Encounter (Signed)
Rx faxed to pharmacy, pt informed.  

## 2015-09-25 DIAGNOSIS — Z0100 Encounter for examination of eyes and vision without abnormal findings: Secondary | ICD-10-CM | POA: Diagnosis not present

## 2015-10-21 ENCOUNTER — Telehealth: Payer: Self-pay | Admitting: Emergency Medicine

## 2015-10-21 NOTE — Telephone Encounter (Signed)
The actual shingle infection normally does not come back, there can be other rashes of course that might occur coincidently, or there can be pain persisting even though the first rash has crusted and healing or even resolved  May need to make ROV as it is very unlikely she would need antibiotic such as valtrex

## 2015-10-21 NOTE — Telephone Encounter (Signed)
Pt called and stated she wants to know if she can have something else for shingles she thinks they are coming back? If not she will schedule an appt. If you can the pharmacy is [facility]- in [place] Please advise thanks.

## 2015-10-22 NOTE — Telephone Encounter (Signed)
Called patient, she is calling back to make an appointment.

## 2015-11-05 ENCOUNTER — Encounter: Payer: Self-pay | Admitting: Internal Medicine

## 2015-11-05 ENCOUNTER — Ambulatory Visit (INDEPENDENT_AMBULATORY_CARE_PROVIDER_SITE_OTHER): Payer: Commercial Managed Care - HMO | Admitting: Internal Medicine

## 2015-11-05 ENCOUNTER — Other Ambulatory Visit (INDEPENDENT_AMBULATORY_CARE_PROVIDER_SITE_OTHER): Payer: Commercial Managed Care - HMO

## 2015-11-05 VITALS — BP 132/72 | HR 100 | Temp 98.7°F | Resp 20 | Wt 187.0 lb

## 2015-11-05 DIAGNOSIS — I2510 Atherosclerotic heart disease of native coronary artery without angina pectoris: Secondary | ICD-10-CM

## 2015-11-05 DIAGNOSIS — F411 Generalized anxiety disorder: Secondary | ICD-10-CM

## 2015-11-05 DIAGNOSIS — E785 Hyperlipidemia, unspecified: Secondary | ICD-10-CM

## 2015-11-05 DIAGNOSIS — R6889 Other general symptoms and signs: Secondary | ICD-10-CM

## 2015-11-05 DIAGNOSIS — F329 Major depressive disorder, single episode, unspecified: Secondary | ICD-10-CM

## 2015-11-05 DIAGNOSIS — F32A Depression, unspecified: Secondary | ICD-10-CM

## 2015-11-05 DIAGNOSIS — Z0001 Encounter for general adult medical examination with abnormal findings: Secondary | ICD-10-CM

## 2015-11-05 DIAGNOSIS — R7989 Other specified abnormal findings of blood chemistry: Secondary | ICD-10-CM | POA: Diagnosis not present

## 2015-11-05 LAB — URINALYSIS, ROUTINE W REFLEX MICROSCOPIC
Bilirubin Urine: NEGATIVE
Hgb urine dipstick: NEGATIVE
Ketones, ur: NEGATIVE
Leukocytes, UA: NEGATIVE
Nitrite: NEGATIVE
SPECIFIC GRAVITY, URINE: 1.02 (ref 1.000–1.030)
Total Protein, Urine: NEGATIVE
Urine Glucose: NEGATIVE
Urobilinogen, UA: 1 (ref 0.0–1.0)
WBC UA: NONE SEEN (ref 0–?)
pH: 6.5 (ref 5.0–8.0)

## 2015-11-05 LAB — LIPID PANEL
CHOLESTEROL: 245 mg/dL — AB (ref 0–200)
HDL: 39.2 mg/dL (ref >39.00)
Total CHOL/HDL Ratio: 6
Triglycerides: 429 mg/dL — ABNORMAL HIGH (ref 0.0–149.0)

## 2015-11-05 LAB — CBC WITH DIFFERENTIAL/PLATELET
BASOS ABS: 0 10*3/uL (ref 0.0–0.1)
Basophils Relative: 0.4 % (ref 0.0–3.0)
EOS ABS: 0.1 10*3/uL (ref 0.0–0.7)
Eosinophils Relative: 1.3 % (ref 0.0–5.0)
HEMATOCRIT: 41.9 % (ref 36.0–46.0)
Hemoglobin: 14.6 g/dL (ref 12.0–15.0)
LYMPHS ABS: 2.8 10*3/uL (ref 0.7–4.0)
LYMPHS PCT: 34.5 % (ref 12.0–46.0)
MCHC: 34.8 g/dL (ref 30.0–36.0)
MCV: 84.8 fl (ref 78.0–100.0)
MONOS PCT: 8.1 % (ref 3.0–12.0)
Monocytes Absolute: 0.7 10*3/uL (ref 0.1–1.0)
NEUTROS PCT: 55.7 % (ref 43.0–77.0)
Neutro Abs: 4.6 10*3/uL (ref 1.4–7.7)
PLATELETS: 334 10*3/uL (ref 150.0–400.0)
RBC: 4.94 Mil/uL (ref 3.87–5.11)
RDW: 13.3 % (ref 11.5–15.5)
WBC: 8.2 10*3/uL (ref 4.0–10.5)

## 2015-11-05 LAB — HEPATIC FUNCTION PANEL
ALBUMIN: 4.6 g/dL (ref 3.5–5.2)
ALT: 6 U/L (ref 0–35)
AST: 14 U/L (ref 0–37)
Alkaline Phosphatase: 85 U/L (ref 39–117)
Bilirubin, Direct: 0 mg/dL (ref 0.0–0.3)
TOTAL PROTEIN: 7.6 g/dL (ref 6.0–8.3)
Total Bilirubin: 0.2 mg/dL (ref 0.2–1.2)

## 2015-11-05 LAB — BASIC METABOLIC PANEL
BUN: 18 mg/dL (ref 6–23)
CALCIUM: 9.4 mg/dL (ref 8.4–10.5)
CHLORIDE: 102 meq/L (ref 96–112)
CO2: 30 meq/L (ref 19–32)
CREATININE: 0.92 mg/dL (ref 0.40–1.20)
GFR: 66.63 mL/min (ref >60.00)
GLUCOSE: 85 mg/dL (ref 70–99)
Potassium: 4.5 mEq/L (ref 3.5–5.1)
Sodium: 137 mEq/L (ref 135–145)

## 2015-11-05 MED ORDER — ESCITALOPRAM OXALATE 10 MG PO TABS: 10.0000 mg | ORAL_TABLET | Freq: Every day | ORAL | 3 refills | Status: DC

## 2015-11-05 MED ORDER — LORAZEPAM 0.5 MG PO TABS: 0.5000 mg | ORAL_TABLET | Freq: Two times a day (BID) | ORAL | 5 refills | Status: DC | PRN

## 2015-11-05 NOTE — Progress Notes (Signed)
Pre visit review using our clinic review tool, if applicable. No additional management support is needed unless otherwise documented below in the visit note. 

## 2015-11-05 NOTE — Patient Instructions (Addendum)
You had the flu shot today  OK to stop the cymbalta  Please take all new medication as prescribed   - the lexapro, and zetia, as well as the ativan as needed  Please continue all other medications as before, and refills have been done if requested.  Please have the pharmacy call with any other refills you may need.  Please continue your efforts at being more active, low cholesterol diet, and weight control.  You are otherwise up to date with prevention measures today.  Please keep your appointments with your specialists as you may have planned  You will be contacted regarding the referral for: Cardiology  Please go to the LAB in the Basement (turn left off the elevator) for the tests to be done today  You will be contacted by phone if any changes need to be made immediately.  Otherwise, you will receive a letter about your results with an explanation, but please check with MyChart first.  Please remember to sign up for MyChart if you have not done so, as this will be important to you in the future with finding out test results, communicating by private email, and scheduling acute appointments online when needed.  Please return in 6 months, or sooner if needed

## 2015-11-05 NOTE — Progress Notes (Signed)
Subjective:    Patient ID: Amy Hodge, female    DOB: [birth year], 60 y.o.   MRN: [id number]  HPI  Here for wellness and f/u;  Overall doing ok;  Pt denies Chest pain, worsening SOB, DOE, wheezing, orthopnea, PND, worsening LE edema, palpitations, dizziness or syncope.  Pt denies neurological change such as new headache, facial or extremity weakness.  Pt denies polydipsia, polyuria, or low sugar symptoms. Pt states overall good compliance with treatment and medications, good tolerability, and has been trying to follow appropriate diet.  Pt denies worsening depressive symptoms, suicidal ideation or panic. No fever, night sweats, wt loss, loss of appetite, or other constitutional symptoms.  Pt states good ability with ADL's, has low fall risk, home safety reviewed and adequate, no other significant changes in hearing or vision, and only occasionally active with exercise.  Pt c/o worsening anxiety/depression -  "I'm not doing well, Very nervous, on edge, and depression getting worse., no desire to do anything.  Low dose xanax no help.  Has been on cymbalta for years. " Asks for change. No SI or HI.    Due to see Dr Einar Gip soon, but has had a falling out, and asks for referral to new cardiologist Past Medical History:  Diagnosis Date  . Allergy   . Anxiety   . COPD (chronic obstructive pulmonary disease) ([place])   . COPD exacerbation ([place]) 12/31/2010  . Coronary artery disease    Stents x4 -past yr ago- Dr. Einar Gip  . DDD (degenerative disc disease), cervical   . Depression   . DJD (degenerative joint disease) of knee    RT  . GERD (gastroesophageal reflux disease)   . Headache(784.0)   . Hyperlipidemia   . Hypothyroidism   . Nocturnal hypoxemia 02/26/2015  . On supplemental oxygen therapy    Oxygen 2.5 l/m at bedtime.  . Stented coronary artery 02/26/2015   Past Surgical History:  Procedure Laterality Date  . ABDOMINAL HYSTERECTOMY    . APPENDECTOMY  1971  . BUNIONECTOMY Right   .  CARDIAC CATHETERIZATION N/A 09/02/2014   Procedure: Left Heart Cath and Coronary Angiography;  Surgeon: Adrian Prows, MD;  Location: [facility];  Service: Cardiovascular;  Laterality: N/A;  . CERVICAL FUSION    . CESAREAN SECTION    . COLONOSCOPY WITH PROPOFOL N/A 02/24/2015   Procedure: COLONOSCOPY WITH PROPOFOL;  Surgeon: Garlan Fair, MD;  Location: [facility];  Service: Endoscopy;  Laterality: N/A;  . ESOPHAGOGASTRODUODENOSCOPY (EGD) WITH PROPOFOL N/A 02/24/2015   Procedure: ESOPHAGOGASTRODUODENOSCOPY (EGD) WITH PROPOFOL;  Surgeon: Garlan Fair, MD;  Location: [facility];  Service: Endoscopy;  Laterality: N/A;  . HAND SURGERY     due to trauma-dog bite.  . I&D EXTREMITY Bilateral 05/17/2013   Procedure: IRRIGATION AND DEBRIDEMENT EXTREMITY;  Surgeon: Roseanne Kaufman, MD;  Location: [facility];  Service: Orthopedics;  Laterality: Bilateral;  . LEFT HEART CATHETERIZATION WITH CORONARY ANGIOGRAM N/A 03/04/2014   Procedure: LEFT HEART CATHETERIZATION WITH CORONARY ANGIOGRAM;  Surgeon: Laverda Page, MD;  Location: [facility];  Service: Cardiovascular;  Laterality: N/A;  . OOPHORECTOMY    . SHOULDER SURGERY     RT  . THYROIDECTOMY  1996  . TONSILLECTOMY  1965  . TOTAL ABDOMINAL HYSTERECTOMY W/ BILATERAL SALPINGOOPHORECTOMY  01/2008   with cervical dysplasia  . TUBAL LIGATION  1984    reports that she quit smoking about 19 years ago. Her smoking use included Cigarettes. She has a 50.00 pack-year smoking history.  She does not have any smokeless tobacco history on file. She reports that she does not drink alcohol or use drugs. family history includes Diabetes in her father; Heart disease in her father; Hypertension in her father. Allergies  Allergen Reactions  . Bactrim [Sulfamethoxazole-Trimethoprim] Shortness Of Breath, Anxiety and Palpitations  . Bystolic [Nebivolol Hcl]     Fatigue- Tired feeling  . Levofloxacin Hives  . Lisinopril Cough  . Morphine And Related Other  (See Comments)    headache  . Simvastatin     Makes my legs hurt/ sore   . Statins Other (See Comments)  . Tetracycline Hives  . Valsartan Cough    headache  . Toradol [Ketorolac Tromethamine] Palpitations   Current Outpatient Prescriptions on File Prior to Visit  Medication Sig Dispense Refill  . acetaminophen (TYLENOL) 500 MG tablet Take 1,000 mg by mouth every 8 (eight) hours as needed for mild pain.     Marland Kitchen albuterol (PROVENTIL HFA;VENTOLIN HFA) 108 (90 BASE) MCG/ACT inhaler Inhale 2 puffs into the lungs every 6 (six) hours as needed for wheezing or shortness of breath.    Marland Kitchen amLODipine (NORVASC) 2.5 MG tablet Take 1 tablet (2.5 mg total) by mouth daily. 90 tablet 3  . aspirin EC 81 MG tablet Take 81 mg by mouth daily.    . clopidogrel (PLAVIX) 75 MG tablet Take 1 tablet (75 mg total) by mouth daily with breakfast. 90 tablet 3  . ibuprofen (ADVIL,MOTRIN) 200 MG tablet Take 800 mg by mouth every 6 (six) hours as needed for headache or moderate pain.    Marland Kitchen levalbuterol (XOPENEX HFA) 45 MCG/ACT inhaler Inhale 2 puffs every 6 hours as needed 1 Inhaler 12  . levocetirizine (XYZAL) 5 MG tablet Take 1 tablet (5 mg total) by mouth daily. 90 tablet 3  . levothyroxine (SYNTHROID) 175 MCG tablet Take 1 tablet (175 mcg total) by mouth daily. 30 tablet 11  . pantoprazole (PROTONIX) 40 MG tablet Take 1 tablet (40 mg total) by mouth daily. 90 tablet 3  . spironolactone (ALDACTONE) 25 MG tablet Take 1 tablet (25 mg total) by mouth daily. 90 tablet 3   No current facility-administered medications on file prior to visit.    Review of Systems Constitutional: Negative for increased diaphoresis, or other activity, appetite or siginficant weight change other than noted HENT: Negative for worsening hearing loss, ear pain, facial swelling, mouth sores and neck stiffness.   Eyes: Negative for other worsening pain, redness or visual disturbance.  Respiratory: Negative for choking or stridor Cardiovascular:  Negative for other chest pain and palpitations.  Gastrointestinal: Negative for worsening diarrhea, blood in stool, or abdominal distention Genitourinary: Negative for hematuria, flank pain or change in urine volume.  Musculoskeletal: Negative for myalgias or other joint complaints.  Skin: Negative for other color change and wound or drainage.  Neurological: Negative for syncope and numbness. other than noted Hematological: Negative for adenopathy. or other swelling Psychiatric/Behavioral: Negative for hallucinations, SI, self-injury, decreased concentration or other worsening agitation.      Objective:   Physical Exam BP 132/72   Pulse 100   Temp 98.7 F (37.1 C) (Oral)   Resp 20   Wt 187 lb (84.8 kg)   SpO2 94%   BMI 30.18 kg/m  VS noted,  Constitutional: Pt is oriented to person, place, and time. Appears well-developed and well-nourished, in no significant distress Head: Normocephalic and atraumatic  Eyes: Conjunctivae and EOM are normal. Pupils are equal, round, and reactive to light Right Ear:  External ear normal.  Left Ear: External ear normal Nose: Nose normal.  Mouth/Throat: Oropharynx is clear and moist  Neck: Normal range of motion. Neck supple. No JVD present. No tracheal deviation present or significant neck LA or mass Cardiovascular: Normal rate, regular rhythm, normal heart sounds and intact distal pulses.   Pulmonary/Chest: Effort normal and breath sounds without rales or wheezing  Abdominal: Soft. Bowel sounds are normal. NT. No HSM  Musculoskeletal: Normal range of motion. Exhibits no edema Lymphadenopathy: Has no cervical adenopathy.  Neurological: Pt is alert and oriented to person, place, and time. Pt has normal reflexes. No cranial nerve deficit. Motor grossly intact Skin: Skin is warm and dry. No rash noted or new ulcers Psychiatric:  Has 2+ nervous/depressedl mood and affect. Behavior is normal.   CT CHEST WITH CONTRAST 12 29 2016 IMPRESSION: 1.  Emphysema. A cause for the patient's hemoptysis is not identified. 2. Average size 4 mm right upper lobe pulmonary nodule. If the patient is at high risk for bronchogenic carcinoma, follow-up chest CT at 1 year is recommended. If the patient is at low risk, no follow-up is needed. This recommendation follows the consensus statement: Guidelines for Management of Small Pulmonary Nodules Detected on CT Scans: A Statement from the [place] as published in Radiology 2005; 237:395-400. 3. Ectatic ascending thoracic aorta, although similar in size to the main pulmonary artery. Recommend annual imaging followup by CTA or MRA. This recommendation follows 2010 ACCF/AHA/AATS/ACR/ASA/SCA/SCAI/SIR/STS/SVM Guidelines for the Diagnosis and Management of Patients with Thoracic Aortic Disease. Circulation. 2010; 121: [id number] 4. Coronary artery atherosclerosis.    Assessment & Plan:

## 2015-11-06 ENCOUNTER — Encounter: Payer: Self-pay | Admitting: Internal Medicine

## 2015-11-06 LAB — TSH: TSH: 1.27 u[IU]/mL (ref 0.35–4.50)

## 2015-11-06 LAB — LDL CHOLESTEROL, DIRECT: LDL DIRECT: 143 mg/dL

## 2015-11-08 NOTE — Assessment & Plan Note (Addendum)
Lab Results  Component Value Date   CHOL 245 (H) 11/05/2015   HDL 39.20 11/05/2015   LDLDIRECT 143.0 11/05/2015   TRIG (H) 11/05/2015    429.0 Triglyceride is over 400; calculations on Lipids are invalid.   CHOLHDL 6 11/05/2015   Has been statin intolerant, to start zetia 10 qd  In addition to the time spent performing CPE, I spent an additional 25 minutes face to face,in which greater than 50% of this time was spent in counseling and coordination of care for patient's acute illness as documented.

## 2015-11-08 NOTE — Assessment & Plan Note (Signed)
Mild situational worsening, for ativan prn refill,  to f/u any worsening symptoms or concerns

## 2015-11-08 NOTE — Assessment & Plan Note (Signed)

## 2015-11-08 NOTE — Assessment & Plan Note (Signed)
No SI or HI, for change cymbalta to lexapro 10 qd,  to f/u any worsening symptoms or concerns, declines referral for counseling or psychiatry

## 2015-11-08 NOTE — Assessment & Plan Note (Signed)
Pt adamant for change cardiologist, will defer to her reqeust

## 2015-11-08 NOTE — Assessment & Plan Note (Signed)
>>  ASSESSMENT AND PLAN FOR HYPERLIPIDEMIA- INTOL TO STATINS WRITTEN ON 11/08/2015 12:32 PM BY Biagio Borg, MD  Lab Results  Component Value Date   CHOL 245 (H) 11/05/2015   HDL 39.20 11/05/2015   LDLDIRECT 143.0 11/05/2015   TRIG (H) 11/05/2015    429.0 Triglyceride is over 400; calculations on Lipids are invalid.   CHOLHDL 6 11/05/2015   Has been statin intolerant, to start zetia 10 qd  In addition to the time spent performing CPE, I spent an additional 25 minutes face to face,in which greater than 50% of this time was spent in counseling and coordination of care for patient's acute illness as documented.

## 2015-11-26 ENCOUNTER — Telehealth: Payer: Self-pay | Admitting: Emergency Medicine

## 2015-11-26 NOTE — Telephone Encounter (Signed)
done

## 2015-11-26 NOTE — Telephone Encounter (Signed)
Ok to refer to [facility]

## 2015-11-26 NOTE — Telephone Encounter (Signed)
Pt called and her insurnce is requring a written referral to be brought to her appt on 10/6. Can she get a copy of that please. She will come by and pick it up. Thanks.

## 2015-11-30 ENCOUNTER — Telehealth: Payer: Self-pay | Admitting: Cardiology

## 2015-11-30 NOTE — Telephone Encounter (Signed)
Records received from [facility] for apt on 12/11/15 w/ Dr Stanford Breed, records given to Nellie (medical records) CN

## 2015-12-07 ENCOUNTER — Telehealth: Payer: Self-pay | Admitting: Internal Medicine

## 2015-12-07 NOTE — Telephone Encounter (Signed)
escitalopram (LEXAPRO) 10 MG tablet  Patient called and stated that she did not think this medication was working for her. She would like to try something else. She was on something before years back and felt that worked pretty good then. Please follow up with patient. Thank you.

## 2015-12-07 NOTE — Progress Notes (Signed)
HPI: 60 yo female for evaluation of CAD. Previously followed by Dr Einar Gip. Pt had NSTEMI XX123456 with complicated PCI of RCA (performed in [facility]). Echo 3/15 showed normal LV function, mild LVH, grade 1 DD, mild RVE. ABI 12/15 normal. Last cath 6/16 showed 100 PDA, patent stents in proximal and mid RCA, no obstructive disease in the left system; collaterals to the PDA noted; PTCA of CTO unsuccessful. Chest CT 12/16 showed RUL nodule, emphysema, ascending thoracic aorta 4 cm; fu recommended one year. H/O cough with ACEI. Myalgias with crestor. Patient describes intermittent chest pain. It is substernal with radiation to left upper extremity. It occurs both with exertion and at rest. Some increase with lying flat. She notes dyspnea on exertion and orthopnea. No syncope.  Current Outpatient Prescriptions  Medication Sig Dispense Refill  . acetaminophen (TYLENOL) 500 MG tablet Take 1,000 mg by mouth every 8 (eight) hours as needed for mild pain.     Marland Kitchen albuterol (PROVENTIL HFA;VENTOLIN HFA) 108 (90 BASE) MCG/ACT inhaler Inhale 2 puffs into the lungs every 6 (six) hours as needed for wheezing or shortness of breath.    Marland Kitchen amLODipine (NORVASC) 2.5 MG tablet Take 1 tablet (2.5 mg total) by mouth daily. 90 tablet 3  . aspirin EC 81 MG tablet Take 81 mg by mouth daily.    . cetirizine (ZYRTEC) 10 MG tablet Take 10 mg by mouth daily.    . clopidogrel (PLAVIX) 75 MG tablet Take 1 tablet (75 mg total) by mouth daily with breakfast. 90 tablet 3  . Clotrimazole 1 % OINT Apply topically.    Marland Kitchen escitalopram (LEXAPRO) 10 MG tablet Take 10 mg by mouth daily.    Marland Kitchen ibuprofen (ADVIL,MOTRIN) 200 MG tablet Take 800 mg by mouth every 6 (six) hours as needed for headache or moderate pain.    Marland Kitchen levalbuterol (XOPENEX HFA) 45 MCG/ACT inhaler Inhale 2 puffs every 6 hours as needed 1 Inhaler 12  . levothyroxine (SYNTHROID) 175 MCG tablet Take 1 tablet (175 mcg total) by mouth daily. 30 tablet 11  . LORazepam (ATIVAN) 0.5 MG tablet  Take 1 tablet (0.5 mg total) by mouth 2 (two) times daily as needed for anxiety. 60 tablet 5  . nitroGLYCERIN (NITROSTAT) 0.4 MG SL tablet Place 0.4 mg under the tongue every 5 (five) minutes as needed for chest pain.    . pantoprazole (PROTONIX) 40 MG tablet Take 1 tablet (40 mg total) by mouth daily. 90 tablet 3  . spironolactone (ALDACTONE) 25 MG tablet Take 1 tablet (25 mg total) by mouth daily. 90 tablet 3  . triamcinolone cream (KENALOG) 0.1 % Apply 1 application topically 2 (two) times daily.     No current facility-administered medications for this visit.     Allergies  Allergen Reactions  . Bactrim [Sulfamethoxazole-Trimethoprim] Shortness Of Breath, Anxiety and Palpitations  . Valsartan Cough    headache  . Bystolic [Nebivolol Hcl] Other (See Comments)    Fatigue- Tired feeling  . Isosorbide Other (See Comments)    HEADACHES   . Levofloxacin Hives  . Lisinopril Cough  . Metoprolol Other (See Comments)    FATIGUE  . Morphine And Related Other (See Comments)    headache  . Simvastatin Other (See Comments)    Makes my legs hurt/ sore   . Statins Other (See Comments)    MYALGIAS  . Tetracycline Hives  . Toradol [Ketorolac Tromethamine] Palpitations     Past Medical History:  Diagnosis Date  . Allergy   .  Anxiety   . COPD (chronic obstructive pulmonary disease) ([place])   . Coronary artery disease    Stents x4 -past yr ago- Dr. Einar Gip  . DDD (degenerative disc disease), cervical   . Depression   . DJD (degenerative joint disease) of knee    RT  . GERD (gastroesophageal reflux disease)   . Hashimoto's disease   . Headache(784.0)   . Hyperlipidemia   . Hypothyroidism   . Nocturnal hypoxemia 02/26/2015  . On supplemental oxygen therapy    Oxygen 2.5 l/m at bedtime.  . Stented coronary artery 02/26/2015    Past Surgical History:  Procedure Laterality Date  . ABDOMINAL HYSTERECTOMY    . APPENDECTOMY  1971  . BUNIONECTOMY Right   . CARDIAC CATHETERIZATION N/A  09/02/2014   Procedure: Left Heart Cath and Coronary Angiography;  Surgeon: Adrian Prows, MD;  Location: [place] [facility];  Service: Cardiovascular;  Laterality: N/A;  . CERVICAL FUSION    . CESAREAN SECTION    . COLONOSCOPY WITH PROPOFOL N/A 02/24/2015   Procedure: COLONOSCOPY WITH PROPOFOL;  Surgeon: Garlan Fair, MD;  Location: [facility];  Service: Endoscopy;  Laterality: N/A;  . ESOPHAGOGASTRODUODENOSCOPY (EGD) WITH PROPOFOL N/A 02/24/2015   Procedure: ESOPHAGOGASTRODUODENOSCOPY (EGD) WITH PROPOFOL;  Surgeon: Garlan Fair, MD;  Location: [facility];  Service: Endoscopy;  Laterality: N/A;  . HAND SURGERY     due to trauma-dog bite.  . I&D EXTREMITY Bilateral 05/17/2013   Procedure: IRRIGATION AND DEBRIDEMENT EXTREMITY;  Surgeon: Roseanne Kaufman, MD;  Location: [place];  Service: Orthopedics;  Laterality: Bilateral;  . LEFT HEART CATHETERIZATION WITH CORONARY ANGIOGRAM N/A 03/04/2014   Procedure: LEFT HEART CATHETERIZATION WITH CORONARY ANGIOGRAM;  Surgeon: Laverda Page, MD;  Location: [facility];  Service: Cardiovascular;  Laterality: N/A;  . OOPHORECTOMY    . SHOULDER SURGERY     RT  . THYROIDECTOMY  1996  . TONSILLECTOMY  1965  . TOTAL ABDOMINAL HYSTERECTOMY W/ BILATERAL SALPINGOOPHORECTOMY  01/2008   with cervical dysplasia  . TUBAL LIGATION  1984    Social History   Social History  . Marital status: Married    Spouse name: N/A  . Number of children: 1  . Years of education: N/A   Occupational History  . disabled Disability   Social History Main Topics  . Smoking status: Former Smoker    Packs/day: 2.00    Years: 25.00    Types: Cigarettes    Quit date: 04/23/1996  . Smokeless tobacco: Never Used  . Alcohol use No  . Drug use: No  . Sexual activity: Not on file   Other Topics Concern  . Not on file   Social History Narrative   On worker's comp for lower back and right shoulder - applying for disability; losing [employer] insurance 03/07/2010    Family  History  Problem Relation Age of Onset  . Lung cancer Mother   . Hypertension Mother   . Stroke Mother   . Diabetes Mother   . Diabetes Father   . Hypertension Father   . Heart attack Father   . Asthma Father   . COPD Father   . Diabetes Sister   . Hypertension Sister     ROS: no fevers or chills, productive cough, hemoptysis, dysphasia, odynophagia, melena, hematochezia, dysuria, hematuria, rash, seizure activity, orthopnea, PND, pedal edema, claudication. Remaining systems are negative.  Physical Exam:   Blood pressure 116/88, pulse 76, height 5\' 6"  (1.676 m), weight 196 lb (88.9 kg).  General:  Well developed/well nourished in NAD Skin warm/dry Patient not depressed No peripheral clubbing Back-normal HEENT-normal/normal eyelids Neck supple/normal carotid upstroke bilaterally; no bruits; no JVD; no thyromegaly chest - CTA/ normal expansion CV - RRR/normal S1 and S2; no murmurs, rubs or gallops;  PMI nondisplaced Abdomen -NT/ND, no HSM, no mass, + bowel sounds, no bruit 2+ femoral pulses, no bruits Ext-no edema, chords, 2+ DP Neuro-grossly nonfocal  ECG - sinus rhythm at a rate of 76. Cannot rule out prior anterior infarct.  A/P  1 Coronary artery disease-continue aspirin. Intolerant to statins.  2 chest pain-symptoms somewhat difficult to assess. Some sound chronic. She has had previous catheterizations which showed chronic total occlusion of the PDA. We discussed options today. I will arrange a cardiac catheterization with Dr. Irish Lack or Dr Martinique to rule out progression of coronary disease. She can also be evaluated for possible intervention for CTO of PDA. The risks and benefits of cardiac catheterization including myocardial infarction, CVA and death discussed and she agrees to proceed.  3 hyperlipidemia-intolerant to all statins and zetia. Check lipids. If elevated we will arrange evaluation for possible PCSK9 inhibitor.    4 hypertension-blood pressure  controlled. Continue present medications.  Kirk Ruths, MD

## 2015-12-08 NOTE — Telephone Encounter (Signed)
Not sure what she means by this, computer record only goes back 5 yrs  Does she mean the cymbalta? We could go back to this if she wants. Just let me know

## 2015-12-08 NOTE — Telephone Encounter (Signed)
Left patient message to give [name] a call back.

## 2015-12-08 NOTE — Telephone Encounter (Signed)
Patient called back to clarify. She states that she meant the wellbutrin. She does not want cymbalta

## 2015-12-11 ENCOUNTER — Encounter: Payer: Self-pay | Admitting: *Deleted

## 2015-12-11 ENCOUNTER — Ambulatory Visit (INDEPENDENT_AMBULATORY_CARE_PROVIDER_SITE_OTHER): Payer: Commercial Managed Care - HMO | Admitting: Cardiology

## 2015-12-11 ENCOUNTER — Other Ambulatory Visit: Payer: Self-pay | Admitting: *Deleted

## 2015-12-11 ENCOUNTER — Encounter: Payer: Self-pay | Admitting: Cardiology

## 2015-12-11 VITALS — BP 116/88 | HR 76 | Ht 66.0 in | Wt 196.0 lb

## 2015-12-11 DIAGNOSIS — I2510 Atherosclerotic heart disease of native coronary artery without angina pectoris: Secondary | ICD-10-CM

## 2015-12-11 DIAGNOSIS — E7849 Other hyperlipidemia: Secondary | ICD-10-CM

## 2015-12-11 DIAGNOSIS — R072 Precordial pain: Secondary | ICD-10-CM

## 2015-12-11 DIAGNOSIS — Z01818 Encounter for other preprocedural examination: Secondary | ICD-10-CM

## 2015-12-11 DIAGNOSIS — E784 Other hyperlipidemia: Secondary | ICD-10-CM

## 2015-12-11 DIAGNOSIS — I712 Thoracic aortic aneurysm, without rupture, unspecified: Secondary | ICD-10-CM

## 2015-12-11 DIAGNOSIS — I10 Essential (primary) hypertension: Secondary | ICD-10-CM | POA: Diagnosis not present

## 2015-12-11 LAB — BASIC METABOLIC PANEL
BUN: 12 mg/dL (ref 7–25)
CHLORIDE: 104 mmol/L (ref 98–110)
CO2: 25 mmol/L (ref 20–31)
CREATININE: 0.79 mg/dL (ref 0.50–1.05)
Calcium: 9.1 mg/dL (ref 8.6–10.4)
Glucose, Bld: 78 mg/dL (ref 65–99)
Potassium: 4.3 mmol/L (ref 3.5–5.3)
Sodium: 139 mmol/L (ref 135–146)

## 2015-12-11 LAB — LIPID PANEL
CHOLESTEROL: 230 mg/dL — AB (ref 125–200)
HDL: 40 mg/dL — ABNORMAL LOW (ref >=46)
LDL Cholesterol: 142 mg/dL — ABNORMAL HIGH (ref <130)
Total CHOL/HDL Ratio: 5.8 Ratio — ABNORMAL HIGH (ref <=5.0)
Triglycerides: 240 mg/dL — ABNORMAL HIGH (ref <150)
VLDL: 48 mg/dL — AB (ref <30)

## 2015-12-11 LAB — CBC
HCT: 38.8 % (ref 35.0–45.0)
Hemoglobin: 13 g/dL (ref 11.7–15.5)
MCH: 28.7 pg (ref 27.0–33.0)
MCHC: 33.5 g/dL (ref 32.0–36.0)
MCV: 85.7 fL (ref 80.0–100.0)
MPV: 9 fL (ref 7.5–12.5)
PLATELETS: 267 10*3/uL (ref 140–400)
RBC: 4.53 MIL/uL (ref 3.80–5.10)
RDW: 13.4 % (ref 11.0–15.0)
WBC: 6.1 10*3/uL (ref 3.8–10.8)

## 2015-12-11 NOTE — Patient Instructions (Addendum)
Medication Instructions:   NO CHANGE  Labwork:  Your physician recommends that you HAVE LAB WORK TODAY  Testing/Procedures:  Your physician has requested that you have a cardiac catheterization. Cardiac catheterization is used to diagnose and/or treat various heart conditions. Doctors may recommend this procedure for a number of different reasons. The most common reason is to evaluate chest pain. Chest pain can be a symptom of coronary artery disease (CAD), and cardiac catheterization can show whether plaque is narrowing or blocking your heart's arteries. This procedure is also used to evaluate the valves, as well as measure the blood flow and oxygen levels in different parts of your heart. For further information please visit [email or web address]. Please follow instruction sheet, as given.   CTA OF THE CHEST W/WO TO FOLLOW UP LUNG NODULE AND THORACIC ANEURYSM-SCHEDULE IN DECEMBER  Follow-Up:  Your physician recommends that you schedule a follow-up appointment in: [place]

## 2015-12-12 LAB — PROTIME-INR
INR: 1
Prothrombin Time: 10.5 s (ref 9.0–11.5)

## 2015-12-15 ENCOUNTER — Ambulatory Visit (HOSPITAL_COMMUNITY)
Admission: RE | Admit: 2015-12-15 | Discharge: 2015-12-15 | Disposition: A | Discharge status: Home / Self Care | Payer: Commercial Managed Care - HMO | Source: Ambulatory Visit | Attending: Interventional Cardiology | Admitting: Interventional Cardiology

## 2015-12-15 ENCOUNTER — Encounter (HOSPITAL_COMMUNITY)
Admission: RE | Disposition: A | Discharge status: Home / Self Care | Payer: Self-pay | Source: Ambulatory Visit | Attending: Interventional Cardiology

## 2015-12-15 DIAGNOSIS — Z833 Family history of diabetes mellitus: Secondary | ICD-10-CM | POA: Diagnosis not present

## 2015-12-15 DIAGNOSIS — I2510 Atherosclerotic heart disease of native coronary artery without angina pectoris: Secondary | ICD-10-CM | POA: Diagnosis not present

## 2015-12-15 DIAGNOSIS — K219 Gastro-esophageal reflux disease without esophagitis: Secondary | ICD-10-CM | POA: Diagnosis not present

## 2015-12-15 DIAGNOSIS — Z8249 Family history of ischemic heart disease and other diseases of the circulatory system: Secondary | ICD-10-CM | POA: Insufficient documentation

## 2015-12-15 DIAGNOSIS — I252 Old myocardial infarction: Secondary | ICD-10-CM | POA: Diagnosis not present

## 2015-12-15 DIAGNOSIS — E785 Hyperlipidemia, unspecified: Secondary | ICD-10-CM | POA: Diagnosis not present

## 2015-12-15 DIAGNOSIS — Z7902 Long term (current) use of antithrombotics/antiplatelets: Secondary | ICD-10-CM | POA: Insufficient documentation

## 2015-12-15 DIAGNOSIS — Z9981 Dependence on supplemental oxygen: Secondary | ICD-10-CM | POA: Diagnosis not present

## 2015-12-15 DIAGNOSIS — Z87891 Personal history of nicotine dependence: Secondary | ICD-10-CM | POA: Diagnosis not present

## 2015-12-15 DIAGNOSIS — Z823 Family history of stroke: Secondary | ICD-10-CM | POA: Diagnosis not present

## 2015-12-15 DIAGNOSIS — Y712 Prosthetic and other implants, materials and accessory cardiovascular devices associated with adverse incidents: Secondary | ICD-10-CM | POA: Diagnosis not present

## 2015-12-15 DIAGNOSIS — Z7982 Long term (current) use of aspirin: Secondary | ICD-10-CM | POA: Insufficient documentation

## 2015-12-15 DIAGNOSIS — J449 Chronic obstructive pulmonary disease, unspecified: Secondary | ICD-10-CM | POA: Diagnosis not present

## 2015-12-15 DIAGNOSIS — E063 Autoimmune thyroiditis: Secondary | ICD-10-CM | POA: Diagnosis not present

## 2015-12-15 DIAGNOSIS — F419 Anxiety disorder, unspecified: Secondary | ICD-10-CM | POA: Diagnosis not present

## 2015-12-15 DIAGNOSIS — I25118 Atherosclerotic heart disease of native coronary artery with other forms of angina pectoris: Secondary | ICD-10-CM | POA: Diagnosis not present

## 2015-12-15 DIAGNOSIS — T82855A Stenosis of coronary artery stent, initial encounter: Secondary | ICD-10-CM | POA: Diagnosis not present

## 2015-12-15 DIAGNOSIS — Z981 Arthrodesis status: Secondary | ICD-10-CM | POA: Insufficient documentation

## 2015-12-15 DIAGNOSIS — F329 Major depressive disorder, single episode, unspecified: Secondary | ICD-10-CM | POA: Diagnosis not present

## 2015-12-15 DIAGNOSIS — I10 Essential (primary) hypertension: Secondary | ICD-10-CM | POA: Insufficient documentation

## 2015-12-15 DIAGNOSIS — M5030 Other cervical disc degeneration, unspecified cervical region: Secondary | ICD-10-CM | POA: Insufficient documentation

## 2015-12-15 DIAGNOSIS — M1710 Unilateral primary osteoarthritis, unspecified knee: Secondary | ICD-10-CM | POA: Diagnosis not present

## 2015-12-15 DIAGNOSIS — R072 Precordial pain: Secondary | ICD-10-CM

## 2015-12-15 HISTORY — PX: CARDIAC CATHETERIZATION: SHX172

## 2015-12-15 SURGERY — LEFT HEART CATH AND CORONARY ANGIOGRAPHY
Anesthesia: LOCAL

## 2015-12-15 MED ORDER — SODIUM CHLORIDE 0.9% FLUSH
3.0000 mL | Freq: Two times a day (BID) | INTRAVENOUS | Status: DC
Start: 2015-12-15 — End: 2015-12-15

## 2015-12-15 MED ORDER — IOPAMIDOL (ISOVUE-370) INJECTION 76%
INTRAVENOUS | Status: DC | PRN
  Administered 2015-12-15: 120 mL via INTRA_ARTERIAL

## 2015-12-15 MED ORDER — SODIUM CHLORIDE 0.9 % WEIGHT BASED INFUSION: 1.0000 mL/kg/h | INTRAVENOUS | Status: DC

## 2015-12-15 MED ORDER — MIDAZOLAM HCL 2 MG/2ML IJ SOLN
INTRAMUSCULAR | Status: AC
  Filled 2015-12-15: qty 2

## 2015-12-15 MED ORDER — FENTANYL CITRATE (PF) 100 MCG/2ML IJ SOLN
INTRAMUSCULAR | Status: DC | PRN
  Administered 2015-12-15 (×4): 25 ug via INTRAVENOUS

## 2015-12-15 MED ORDER — ASPIRIN 81 MG PO CHEW: 81.0000 mg | CHEWABLE_TABLET | ORAL | Status: DC

## 2015-12-15 MED ORDER — MIDAZOLAM HCL 2 MG/2ML IJ SOLN
INTRAMUSCULAR | Status: DC | PRN
  Administered 2015-12-15 (×2): 1 mg via INTRAVENOUS
  Administered 2015-12-15: 2 mg via INTRAVENOUS

## 2015-12-15 MED ORDER — HYDROMORPHONE HCL 1 MG/ML IJ SOLN
INTRAMUSCULAR | Status: AC
  Filled 2015-12-15: qty 1

## 2015-12-15 MED ORDER — SODIUM CHLORIDE 0.9% FLUSH: 3.0000 mL | INTRAVENOUS | Status: DC | PRN

## 2015-12-15 MED ORDER — VERAPAMIL HCL 2.5 MG/ML IV SOLN
INTRAVENOUS | Status: AC
  Filled 2015-12-15: qty 2

## 2015-12-15 MED ORDER — ONDANSETRON HCL 4 MG/2ML IJ SOLN
4.0000 mg | Freq: Once | INTRAMUSCULAR | Status: AC
  Administered 2015-12-15: 4 mg via INTRAVENOUS

## 2015-12-15 MED ORDER — SODIUM CHLORIDE 0.9% FLUSH: 3.0000 mL | Freq: Two times a day (BID) | INTRAVENOUS | Status: DC

## 2015-12-15 MED ORDER — LIDOCAINE HCL (PF) 1 % IJ SOLN
INTRAMUSCULAR | Status: AC
  Filled 2015-12-15: qty 30

## 2015-12-15 MED ORDER — LIDOCAINE HCL (PF) 1 % IJ SOLN
INTRAMUSCULAR | Status: DC | PRN
  Administered 2015-12-15: 2 mL

## 2015-12-15 MED ORDER — SODIUM CHLORIDE 0.9 % WEIGHT BASED INFUSION
3.0000 mL/kg/h | INTRAVENOUS | Status: AC
  Administered 2015-12-15: 3 mL/kg/h via INTRAVENOUS

## 2015-12-15 MED ORDER — SODIUM CHLORIDE 0.9 % IV SOLN: 250.0000 mL | INTRAVENOUS | Status: DC | PRN

## 2015-12-15 MED ORDER — HEPARIN (PORCINE) IN NACL 2-0.9 UNIT/ML-% IJ SOLN
INTRAMUSCULAR | Status: AC
  Filled 2015-12-15: qty 500

## 2015-12-15 MED ORDER — HEPARIN SODIUM (PORCINE) 1000 UNIT/ML IJ SOLN
INTRAMUSCULAR | Status: DC | PRN
  Administered 2015-12-15: 4500 [IU] via INTRAVENOUS

## 2015-12-15 MED ORDER — HEPARIN (PORCINE) IN NACL 2-0.9 UNIT/ML-% IJ SOLN
INTRAMUSCULAR | Status: DC | PRN
  Administered 2015-12-15: 500 mL

## 2015-12-15 MED ORDER — IOPAMIDOL (ISOVUE-370) INJECTION 76%
INTRAVENOUS | Status: AC
  Filled 2015-12-15: qty 100

## 2015-12-15 MED ORDER — HYDROMORPHONE HCL 1 MG/ML IJ SOLN
1.0000 mg | INTRAMUSCULAR | Status: DC | PRN
  Administered 2015-12-15: 1 mg via INTRAVENOUS

## 2015-12-15 MED ORDER — ONDANSETRON HCL 4 MG/2ML IJ SOLN
INTRAMUSCULAR | Status: AC
  Filled 2015-12-15: qty 2

## 2015-12-15 MED ORDER — SODIUM CHLORIDE 0.9 % WEIGHT BASED INFUSION: 1.0000 mL/kg/h | INTRAVENOUS | Status: AC

## 2015-12-15 MED ORDER — IOPAMIDOL (ISOVUE-370) INJECTION 76%
INTRAVENOUS | Status: AC
  Filled 2015-12-15: qty 50

## 2015-12-15 MED ORDER — VERAPAMIL HCL 2.5 MG/ML IV SOLN
INTRAVENOUS | Status: DC | PRN
  Administered 2015-12-15: 10 mL via INTRA_ARTERIAL

## 2015-12-15 MED ORDER — FENTANYL CITRATE (PF) 100 MCG/2ML IJ SOLN
INTRAMUSCULAR | Status: AC
  Filled 2015-12-15: qty 2

## 2015-12-15 MED ORDER — HEPARIN SODIUM (PORCINE) 1000 UNIT/ML IJ SOLN
INTRAMUSCULAR | Status: AC
  Filled 2015-12-15: qty 1

## 2015-12-15 SURGICAL SUPPLY — 15 items
CATH INFINITI 5 FR JL3.5 (CATHETERS) ×1 IMPLANT
CATH INFINITI 5FR ANG PIGTAIL (CATHETERS) ×1 IMPLANT
CATH INFINITI 5FR JL4 (CATHETERS) ×1 IMPLANT
CATH INFINITI JR4 5F (CATHETERS) ×1 IMPLANT
CATH LAUNCHER 5F EBU3.0 (CATHETERS) IMPLANT
CATHETER LAUNCHER 5F EBU3.0 (CATHETERS) ×2
DEVICE RAD COMP TR BAND LRG (VASCULAR PRODUCTS) ×1 IMPLANT
GLIDESHEATH SLEND SS 6F .021 (SHEATH) ×1 IMPLANT
KIT HEART LEFT (KITS) ×2 IMPLANT
PACK CARDIAC CATHETERIZATION (CUSTOM PROCEDURE TRAY) ×2 IMPLANT
SYR MEDRAD MARK V 150ML (SYRINGE) ×2 IMPLANT
TRANSDUCER W/STOPCOCK (MISCELLANEOUS) ×2 IMPLANT
TUBING CIL FLEX 10 FLL-RA (TUBING) ×2 IMPLANT
WIRE HI TORQ VERSACORE-J 145CM (WIRE) ×1 IMPLANT
WIRE J 3MM .035X260CM (WIRE) ×1 IMPLANT

## 2015-12-15 NOTE — H&P (View-Only) (Signed)
HPI: 60 yo female for evaluation of CAD. Previously followed by Dr Einar Gip. Pt had NSTEMI XX123456 with complicated PCI of RCA (performed in [facility]). Echo 3/15 showed normal LV function, mild LVH, grade 1 DD, mild RVE. ABI 12/15 normal. Last cath 6/16 showed 100 PDA, patent stents in proximal and mid RCA, no obstructive disease in the left system; collaterals to the PDA noted; PTCA of CTO unsuccessful. Chest CT 12/16 showed RUL nodule, emphysema, ascending thoracic aorta 4 cm; fu recommended one year. H/O cough with ACEI. Myalgias with crestor. Patient describes intermittent chest pain. It is substernal with radiation to left upper extremity. It occurs both with exertion and at rest. Some increase with lying flat. She notes dyspnea on exertion and orthopnea. No syncope.  Current Outpatient Prescriptions  Medication Sig Dispense Refill  . acetaminophen (TYLENOL) 500 MG tablet Take 1,000 mg by mouth every 8 (eight) hours as needed for mild pain.     Marland Kitchen albuterol (PROVENTIL HFA;VENTOLIN HFA) 108 (90 BASE) MCG/ACT inhaler Inhale 2 puffs into the lungs every 6 (six) hours as needed for wheezing or shortness of breath.    Marland Kitchen amLODipine (NORVASC) 2.5 MG tablet Take 1 tablet (2.5 mg total) by mouth daily. 90 tablet 3  . aspirin EC 81 MG tablet Take 81 mg by mouth daily.    . cetirizine (ZYRTEC) 10 MG tablet Take 10 mg by mouth daily.    . clopidogrel (PLAVIX) 75 MG tablet Take 1 tablet (75 mg total) by mouth daily with breakfast. 90 tablet 3  . Clotrimazole 1 % OINT Apply topically.    Marland Kitchen escitalopram (LEXAPRO) 10 MG tablet Take 10 mg by mouth daily.    Marland Kitchen ibuprofen (ADVIL,MOTRIN) 200 MG tablet Take 800 mg by mouth every 6 (six) hours as needed for headache or moderate pain.    Marland Kitchen levalbuterol (XOPENEX HFA) 45 MCG/ACT inhaler Inhale 2 puffs every 6 hours as needed 1 Inhaler 12  . levothyroxine (SYNTHROID) 175 MCG tablet Take 1 tablet (175 mcg total) by mouth daily. 30 tablet 11  . LORazepam (ATIVAN) 0.5 MG tablet  Take 1 tablet (0.5 mg total) by mouth 2 (two) times daily as needed for anxiety. 60 tablet 5  . nitroGLYCERIN (NITROSTAT) 0.4 MG SL tablet Place 0.4 mg under the tongue every 5 (five) minutes as needed for chest pain.    . pantoprazole (PROTONIX) 40 MG tablet Take 1 tablet (40 mg total) by mouth daily. 90 tablet 3  . spironolactone (ALDACTONE) 25 MG tablet Take 1 tablet (25 mg total) by mouth daily. 90 tablet 3  . triamcinolone cream (KENALOG) 0.1 % Apply 1 application topically 2 (two) times daily.     No current facility-administered medications for this visit.     Allergies  Allergen Reactions  . Bactrim [Sulfamethoxazole-Trimethoprim] Shortness Of Breath, Anxiety and Palpitations  . Valsartan Cough    headache  . Bystolic [Nebivolol Hcl] Other (See Comments)    Fatigue- Tired feeling  . Isosorbide Other (See Comments)    HEADACHES   . Levofloxacin Hives  . Lisinopril Cough  . Metoprolol Other (See Comments)    FATIGUE  . Morphine And Related Other (See Comments)    headache  . Simvastatin Other (See Comments)    Makes my legs hurt/ sore   . Statins Other (See Comments)    MYALGIAS  . Tetracycline Hives  . Toradol [Ketorolac Tromethamine] Palpitations     Past Medical History:  Diagnosis Date  . Allergy   .  Anxiety   . COPD (chronic obstructive pulmonary disease) ([place])   . Coronary artery disease    Stents x4 -past yr ago- Dr. Einar Gip  . DDD (degenerative disc disease), cervical   . Depression   . DJD (degenerative joint disease) of knee    RT  . GERD (gastroesophageal reflux disease)   . Hashimoto's disease   . Headache(784.0)   . Hyperlipidemia   . Hypothyroidism   . Nocturnal hypoxemia 02/26/2015  . On supplemental oxygen therapy    Oxygen 2.5 l/m at bedtime.  . Stented coronary artery 02/26/2015    Past Surgical History:  Procedure Laterality Date  . ABDOMINAL HYSTERECTOMY    . APPENDECTOMY  1971  . BUNIONECTOMY Right   . CARDIAC CATHETERIZATION N/A  09/02/2014   Procedure: Left Heart Cath and Coronary Angiography;  Surgeon: Adrian Prows, MD;  Location: [place] [facility];  Service: Cardiovascular;  Laterality: N/A;  . CERVICAL FUSION    . CESAREAN SECTION    . COLONOSCOPY WITH PROPOFOL N/A 02/24/2015   Procedure: COLONOSCOPY WITH PROPOFOL;  Surgeon: Garlan Fair, MD;  [facility];  Service: Endoscopy;  Laterality: N/A;  . ESOPHAGOGASTRODUODENOSCOPY (EGD) WITH PROPOFOL N/A 02/24/2015   Procedure: ESOPHAGOGASTRODUODENOSCOPY (EGD) WITH PROPOFOL;  Surgeon: Garlan Fair, MD;  [facility];  Service: Endoscopy;  Laterality: N/A;  . HAND SURGERY     due to trauma-dog bite.  . I&D EXTREMITY Bilateral 05/17/2013   Procedure: IRRIGATION AND DEBRIDEMENT EXTREMITY;  Surgeon: Roseanne Kaufman, MD;  Location: [place];  Service: Orthopedics;  Laterality: Bilateral;  . LEFT HEART CATHETERIZATION WITH CORONARY ANGIOGRAM N/A 03/04/2014   Procedure: LEFT HEART CATHETERIZATION WITH CORONARY ANGIOGRAM;  Surgeon: Laverda Page, MD;  Location: [facility];  Service: Cardiovascular;  Laterality: N/A;  . OOPHORECTOMY    . SHOULDER SURGERY     RT  . THYROIDECTOMY  1996  . TONSILLECTOMY  1965  . TOTAL ABDOMINAL HYSTERECTOMY W/ BILATERAL SALPINGOOPHORECTOMY  01/2008   with cervical dysplasia  . TUBAL LIGATION  1984    Social History   Social History  . Marital status: Married    Spouse name: N/A  . Number of children: 1  . Years of education: N/A   Occupational History  . disabled Disability   Social History Main Topics  . Smoking status: Former Smoker    Packs/day: 2.00    Years: 25.00    Types: Cigarettes    Quit date: 04/23/1996  . Smokeless tobacco: Never Used  . Alcohol use No  . Drug use: No  . Sexual activity: Not on file   Other Topics Concern  . Not on file   Social History Narrative   On worker's comp for lower back and right shoulder - applying for disability; losing [employer] insurance 03/07/2010    Family  History  Problem Relation Age of Onset  . Lung cancer Mother   . Hypertension Mother   . Stroke Mother   . Diabetes Mother   . Diabetes Father   . Hypertension Father   . Heart attack Father   . Asthma Father   . COPD Father   . Diabetes Sister   . Hypertension Sister     ROS: no fevers or chills, productive cough, hemoptysis, dysphasia, odynophagia, melena, hematochezia, dysuria, hematuria, rash, seizure activity, orthopnea, PND, pedal edema, claudication. Remaining systems are negative.  Physical Exam:   Blood pressure 116/88, pulse 76, height 5\' 6"  (1.676 m), weight 196 lb (88.9 kg).  General:  Well developed/well nourished in NAD Skin warm/dry Patient not depressed No peripheral clubbing Back-normal HEENT-normal/normal eyelids Neck supple/normal carotid upstroke bilaterally; no bruits; no JVD; no thyromegaly chest - CTA/ normal expansion CV - RRR/normal S1 and S2; no murmurs, rubs or gallops;  PMI nondisplaced Abdomen -NT/ND, no HSM, no mass, + bowel sounds, no bruit 2+ femoral pulses, no bruits Ext-no edema, chords, 2+ DP Neuro-grossly nonfocal  ECG - sinus rhythm at a rate of 76. Cannot rule out prior anterior infarct.  A/P  1 Coronary artery disease-continue aspirin. Intolerant to statins.  2 chest pain-symptoms somewhat difficult to assess. Some sound chronic. She has had previous catheterizations which showed chronic total occlusion of the PDA. We discussed options today. I will arrange a cardiac catheterization with Dr. Irish Lack or Dr Martinique to rule out progression of coronary disease. She can also be evaluated for possible intervention for CTO of PDA. The risks and benefits of cardiac catheterization including myocardial infarction, CVA and death discussed and she agrees to proceed.  3 hyperlipidemia-intolerant to all statins and zetia. Check lipids. If elevated we will arrange evaluation for possible PCSK9 inhibitor.    4 hypertension-blood pressure  controlled. Continue present medications.  Kirk Ruths, MD

## 2015-12-15 NOTE — Discharge Instructions (Signed)
Radial Site Care °Refer to this sheet in the next few weeks. These instructions provide you with information about caring for yourself after your procedure. Your health care provider may also give you more specific instructions. Your treatment has been planned according to current medical practices, but problems sometimes occur. Call your health care provider if you have any problems or questions after your procedure. °WHAT TO EXPECT AFTER THE PROCEDURE °After your procedure, it is typical to have the following: °· Bruising at the radial site that usually fades within 1-2 weeks. °· Blood collecting in the tissue (hematoma) that may be painful to the touch. It should usually decrease in size and tenderness within 1-2 weeks. °HOME CARE INSTRUCTIONS °· Take medicines only as directed by your health care provider. °· You may shower 24-48 hours after the procedure or as directed by your health care provider. Remove the bandage (dressing) and gently wash the site with plain soap and water. Pat the area dry with a clean towel. Do not rub the site, because this may cause bleeding. °· Do not take baths, swim, or use a hot tub until your health care provider approves. °· Check your insertion site every day for redness, swelling, or drainage. °· Do not apply powder or lotion to the site. °· Do not flex or bend the affected arm for 24 hours or as directed by your health care provider. °· Do not push or pull heavy objects with the affected arm for 24 hours or as directed by your health care provider. °· Do not lift over 10 lb (4.5 kg) for 5 days after your procedure or as directed by your health care provider. °· Ask your health care provider when it is okay to: °¨ Return to work or school. °¨ Resume usual physical activities or sports. °¨ Resume sexual activity. °· Do not drive home if you are discharged the same day as the procedure. Have someone else drive you. °· You may drive 24 hours after the procedure unless otherwise  instructed by your health care provider. °· Do not operate machinery or power tools for 24 hours after the procedure. °· If your procedure was done as an outpatient procedure, which means that you went home the same day as your procedure, a responsible adult should be with you for the first 24 hours after you arrive home. °· Keep all follow-up visits as directed by your health care provider. This is important. °SEEK MEDICAL CARE IF: °· You have a fever. °· You have chills. °· You have increased bleeding from the radial site. Hold pressure on the site. °SEEK IMMEDIATE MEDICAL CARE IF: °· You have unusual pain at the radial site. °· You have redness, warmth, or swelling at the radial site. °· You have drainage (other than a small amount of blood on the dressing) from the radial site. °· The radial site is bleeding, and the bleeding does not stop after 30 minutes of holding steady pressure on the site. °· Your arm or hand becomes pale, cool, tingly, or numb. °  °This information is not intended to replace advice given to you by your health care provider. Make sure you discuss any questions you have with your health care provider. °  °Document Released: 03/26/2010 Document Revised: 03/14/2014 Document Reviewed: 09/09/2013 °Elsevier Interactive Patient Education ©2016 [employer] ° °

## 2015-12-15 NOTE — Progress Notes (Signed)
Dr Irish Lack notified of c/o chest pain and shortness of breath and headache and orders noted

## 2015-12-15 NOTE — Interval H&P Note (Signed)
Cath Lab Visit (complete for each Cath Lab visit)  Clinical Evaluation Leading to the Procedure:   ACS: No.  Non-ACS:    Anginal Classification: CCS III  Anti-ischemic medical therapy: Minimal Therapy (1 class of medications)  Non-Invasive Test Results: No non-invasive testing performed  Prior CABG: No previous CABG   Known CTO of the PDA.   History and Physical Interval Note:  12/15/2015 10:20 AM  Amy Hodge  has presented today for surgery, with the diagnosis of cp  The various methods of treatment have been discussed with the patient and family. After consideration of risks, benefits and other options for treatment, the patient has consented to  Procedure(s): Left Heart Cath and Coronary Angiography (N/A) as a surgical intervention .  The patient's history has been reviewed, patient examined, no change in status, stable for surgery.  I have reviewed the patient's chart and labs.  Questions were answered to the patient's satisfaction.     Larae Grooms

## 2015-12-15 NOTE — Progress Notes (Signed)
Pt's TRB off. Discharge teaching done. Pts chest pain resolved.  Pt states she has to go to the bathroom. When attempting to get pt up, pt became pale and diaphoretic and stated she was nauseated.  Pt started vomiting and stated her chest pain is back and is a "6" Harlan Stains, PA  notified and came to assess pt. Zofran ordered IV. PT's chest pain is resolving.  Pt's color is returning to normal.  Report given to [facility]

## 2015-12-16 ENCOUNTER — Encounter (HOSPITAL_COMMUNITY): Payer: Self-pay | Admitting: Interventional Cardiology

## 2015-12-21 ENCOUNTER — Telehealth: Payer: Self-pay | Admitting: *Deleted

## 2015-12-21 MED ORDER — BUPROPION HCL ER (XL) 150 MG PO TB24: 150.0000 mg | ORAL_TABLET | Freq: Every day | ORAL | 3 refills | Status: DC

## 2015-12-21 NOTE — Telephone Encounter (Signed)
Patient is calling to follow up . Advised that dr Jenny Reichmann has been out of office. Marya Amsler, any insight on this?

## 2015-12-21 NOTE — Telephone Encounter (Addendum)
Ok for change lexapro to wellbutrin - done erx to [employer]

## 2015-12-21 NOTE — Addendum Note (Signed)
Addended by: Biagio Borg on: 12/21/2015 09:23 PM   Modules accepted: Orders

## 2015-12-21 NOTE — Telephone Encounter (Signed)
-----   Message from Lelon Perla, MD sent at 12/12/2015  6:20 AM EDT ----- Refer to lipid clinic Kirk Ruths

## 2015-12-21 NOTE — Telephone Encounter (Signed)
Called to discuss lab work with the patient, she reports not doing well since cath. She c/o continued SOB, dizziness and chest pain. She reports the cath was a bad experience. Aware dr Stanford Breed is not in the office this week, Follow up scheduled with pa after Wednesday this week per pt request.

## 2015-12-24 NOTE — Telephone Encounter (Signed)
Pt called in and and said that the lexapro is not help her.  She said that she called in a few times (which I did not see) but she said that she needs to talk to someone asap

## 2015-12-24 NOTE — Telephone Encounter (Signed)
Pt called in and said that he has called a few times about the [facility]

## 2015-12-24 NOTE — Telephone Encounter (Signed)
This has alredy been addressed - see notes below

## 2015-12-24 NOTE — Telephone Encounter (Signed)
Please advise patient states that the lexapro is not working and would like to try welbutrin since she was on it in the past.

## 2015-12-25 ENCOUNTER — Ambulatory Visit: Payer: Commercial Managed Care - HMO | Admitting: Physician Assistant

## 2016-01-20 ENCOUNTER — Encounter: Payer: Self-pay | Admitting: Internal Medicine

## 2016-01-20 ENCOUNTER — Ambulatory Visit (INDEPENDENT_AMBULATORY_CARE_PROVIDER_SITE_OTHER): Payer: Commercial Managed Care - HMO | Admitting: Internal Medicine

## 2016-01-20 ENCOUNTER — Telehealth: Payer: Self-pay | Admitting: Internal Medicine

## 2016-01-20 VITALS — BP 138/74 | HR 88 | Temp 98.1°F | Resp 20 | Wt 200.0 lb

## 2016-01-20 DIAGNOSIS — F329 Major depressive disorder, single episode, unspecified: Secondary | ICD-10-CM

## 2016-01-20 DIAGNOSIS — R079 Chest pain, unspecified: Secondary | ICD-10-CM | POA: Diagnosis not present

## 2016-01-20 DIAGNOSIS — E7849 Other hyperlipidemia: Secondary | ICD-10-CM

## 2016-01-20 DIAGNOSIS — Z0001 Encounter for general adult medical examination with abnormal findings: Secondary | ICD-10-CM

## 2016-01-20 DIAGNOSIS — F411 Generalized anxiety disorder: Secondary | ICD-10-CM

## 2016-01-20 DIAGNOSIS — E784 Other hyperlipidemia: Secondary | ICD-10-CM

## 2016-01-20 DIAGNOSIS — F32A Depression, unspecified: Secondary | ICD-10-CM

## 2016-01-20 MED ORDER — DESVENLAFAXINE SUCCINATE ER 50 MG PO TB24
50.0000 mg | ORAL_TABLET | Freq: Every day | ORAL | 3 refills | Status: DC
Start: 2016-01-20 — End: 2016-01-20

## 2016-01-20 MED ORDER — BUPROPION HCL ER (XL) 300 MG PO TB24: 300.0000 mg | ORAL_TABLET | Freq: Every day | ORAL | 3 refills | Status: DC

## 2016-01-20 NOTE — Telephone Encounter (Signed)
Patient informed of the change.

## 2016-01-20 NOTE — Telephone Encounter (Signed)
desvenlafaxine (PRISTIQ) 50 MG 24 hr tablet    Patient states that this rx cost to much and she wants to go back on what she was on before. Please follow up,Thank you.

## 2016-01-20 NOTE — Progress Notes (Signed)
Subjective:    Patient ID: Amy Hodge, female    DOB: Jul 24, 1955, 60 y.o.   MRN: [id number]  HPI    Here to f/u; c/o sharp recurring CP in variable locations but more to the SSCP area, but no diaphoresis, n/v,  And Pt denies  increasing sob or doe, wheezing, orthopnea, PND, increased LE swelling, palpitations, dizziness or syncope. Pain is nonpositional, non exertional.  States increased stress makes it worse, less stress does opposite.  Recent cath as documented and reviewed with pt. No ST, cough.  Pt denies new neurological symptoms such as new headache, or facial or extremity weakness or numbness.  Pt denies polydipsia, polyuria, or low sugar episode.   Pt denies new neurological symptoms such as new headache, or facial or extremity weakness or numbness.   Pt states overall good compliance with meds, mostly trying to follow appropriate diet, with wt overall increased,  but little exercise however, pt states due to worsening depression symptoms.  Now on wellbutrin due to ? Wt gain with lexapro., does not want to restart. Was also previously on klonopin.  Cymbalta no help after years of taking.  Has tried effexor in past, did not seem effective. Only takes ativan once per day as makes more depressed.  Has tried xanax , seemed to make her more jittery.  Has gained wt with more stress, has been as low as 162, was 187 at last visit here. Horrifed about wt gain.  Wt Readings from Last 3 Encounters:  01/20/16 200 lb (90.7 kg)  12/15/15 194 lb (88 kg)  12/11/15 196 lb (88.9 kg)  Not happy after recent cath as she left after with more CP and weakness, took 10 days to get back to close to normal.  Drinks plenty of fluids, no LE swelling, and goes to BR every 2 hrs, even at night.  Has ankles and knees swelling sometimes.  . Past Medical History:  Diagnosis Date  . Allergy   . Anxiety   . COPD (chronic obstructive pulmonary disease) ([place])   . Coronary artery disease    Stents x4 -past yr ago- Dr.  Einar Gip  . DDD (degenerative disc disease), cervical   . Depression   . DJD (degenerative joint disease) of knee    RT  . GERD (gastroesophageal reflux disease)   . Hashimoto's disease   . Headache(784.0)   . Hyperlipidemia   . Hypothyroidism   . Nocturnal hypoxemia 02/26/2015  . On supplemental oxygen therapy    Oxygen 2.5 l/m at bedtime.  . Stented coronary artery 02/26/2015   Past Surgical History:  Procedure Laterality Date  . ABDOMINAL HYSTERECTOMY    . APPENDECTOMY  1971  . BUNIONECTOMY Right   . CARDIAC CATHETERIZATION N/A 09/02/2014   Procedure: Left Heart Cath and Coronary Angiography;  Surgeon: Adrian Prows, MD;  Location: [facility];  Service: Cardiovascular;  Laterality: N/A;  . CARDIAC CATHETERIZATION N/A 12/15/2015   Procedure: Left Heart Cath and Coronary Angiography;  Surgeon: Jettie Booze, MD;  Location: [facility];  Service: Cardiovascular;  Laterality: N/A;  . CERVICAL FUSION    . CESAREAN SECTION    . COLONOSCOPY WITH PROPOFOL N/A 02/24/2015   Procedure: COLONOSCOPY WITH PROPOFOL;  Surgeon: Garlan Fair, MD;  Location: [facility];  Service: Endoscopy;  Laterality: N/A;  . ESOPHAGOGASTRODUODENOSCOPY (EGD) WITH PROPOFOL N/A 02/24/2015   Procedure: ESOPHAGOGASTRODUODENOSCOPY (EGD) WITH PROPOFOL;  Surgeon: Garlan Fair, MD;  Location: [facility];  Service: Endoscopy;  Laterality: N/A;  . HAND SURGERY     due to trauma-dog bite.  . I&D EXTREMITY Bilateral 05/17/2013   Procedure: IRRIGATION AND DEBRIDEMENT EXTREMITY;  Surgeon: Roseanne Kaufman, MD;  Location: [facility];  Service: Orthopedics;  Laterality: Bilateral;  . LEFT HEART CATHETERIZATION WITH CORONARY ANGIOGRAM N/A 03/04/2014   Procedure: LEFT HEART CATHETERIZATION WITH CORONARY ANGIOGRAM;  Surgeon: Laverda Page, MD;  Location: [facility];  Service: Cardiovascular;  Laterality: N/A;  . OOPHORECTOMY    . SHOULDER SURGERY     RT  . THYROIDECTOMY  1996  . TONSILLECTOMY  1965  .  TOTAL ABDOMINAL HYSTERECTOMY W/ BILATERAL SALPINGOOPHORECTOMY  01/2008   with cervical dysplasia  . TUBAL LIGATION  1984    reports that she quit smoking about 19 years ago. Her smoking use included Cigarettes. She has a 50.00 pack-year smoking history. She has never used smokeless tobacco. She reports that she does not drink alcohol or use drugs. family history includes Asthma in her father; COPD in her father; Diabetes in her father, mother, and sister; Heart attack in her father; Hypertension in her father, mother, and sister; Lung cancer in her mother; Stroke in her mother. Allergies  Allergen Reactions  . Bactrim [Sulfamethoxazole-Trimethoprim] Shortness Of Breath, Anxiety and Palpitations  . Valsartan Cough    headache  . Bystolic [Nebivolol Hcl] Other (See Comments)    Fatigue- Tired feeling  . Isosorbide Other (See Comments)    HEADACHES   . Levofloxacin Hives  . Lisinopril Cough  . Metoprolol Other (See Comments)    FATIGUE  . Morphine And Related Other (See Comments)    headache  . Simvastatin Other (See Comments)    Makes my legs hurt/ sore   . Statins Other (See Comments)    MYALGIAS  . Tetracycline Hives  . Toradol [Ketorolac Tromethamine] Palpitations   Current Outpatient Prescriptions on File Prior to Visit  Medication Sig Dispense Refill  . acetaminophen (TYLENOL) 500 MG tablet Take 1,000 mg by mouth every 8 (eight) hours as needed for mild pain.     Marland Kitchen albuterol (PROVENTIL HFA;VENTOLIN HFA) 108 (90 BASE) MCG/ACT inhaler Inhale 2 puffs into the lungs every 6 (six) hours as needed for wheezing or shortness of breath.    Marland Kitchen amLODipine (NORVASC) 2.5 MG tablet Take 1 tablet (2.5 mg total) by mouth daily. 90 tablet 3  . aspirin EC 81 MG tablet Take 81 mg by mouth daily.    . cetirizine (ZYRTEC) 10 MG tablet Take 10 mg by mouth daily.    . clopidogrel (PLAVIX) 75 MG tablet Take 1 tablet (75 mg total) by mouth daily with breakfast. 90 tablet 3  . ibuprofen (ADVIL,MOTRIN)  200 MG tablet Take 800 mg by mouth every 6 (six) hours as needed for headache or moderate pain.    Marland Kitchen levothyroxine (SYNTHROID) 175 MCG tablet Take 1 tablet (175 mcg total) by mouth daily. 30 tablet 11  . LORazepam (ATIVAN) 0.5 MG tablet Take 1 tablet (0.5 mg total) by mouth 2 (two) times daily as needed for anxiety. 60 tablet 5  . nitroGLYCERIN (NITROSTAT) 0.4 MG SL tablet Place 0.4 mg under the tongue every 5 (five) minutes as needed for chest pain.    . pantoprazole (PROTONIX) 40 MG tablet Take 1 tablet (40 mg total) by mouth daily. 90 tablet 3  . spironolactone (ALDACTONE) 25 MG tablet Take 1 tablet (25 mg total) by mouth daily. 90 tablet 3   No current facility-administered medications on file prior to  visit.    Review of Systems  Constitutional: Negative for unusual diaphoresis or night sweats HENT: Negative for ear swelling or discharge Eyes: Negative for worsening visual haziness  Respiratory: Negative for choking and stridor.   Gastrointestinal: Negative for distension or worsening eructation Genitourinary: Negative for retention or change in urine volume.  Musculoskeletal: Negative for other MSK pain or swelling Skin: Negative for color change and worsening wound Neurological: Negative for tremors and numbness other than noted  Psychiatric/Behavioral: Negative for decreased concentration or agitation other than above   All other system neg per pt    Objective:   Physical Exam BP 138/74   Pulse 88   Temp 98.1 F (36.7 C) (Oral)   Resp 20   Wt 200 lb (90.7 kg)   SpO2 97%   BMI 31.80 kg/m  VS noted,  Constitutional: Pt appears in no apparent distress HENT: Head: NCAT.  Right Ear: External ear normal.  Left Ear: External ear normal.  Eyes: . Pupils are equal, round, and reactive to light. Conjunctivae and EOM are normal Neck: Normal range of motion. Neck supple.  Cardiovascular: Normal rate and regular rhythm.   Pulmonary/Chest: Effort normal and breath sounds without  rales or wheezing.  Abd:  Soft, NT, ND, + BS No anterior chest wall tender Neurological: Pt is alert. Not confused , motor grossly intact Skin: Skin is warm. No rash, no LE edema Psychiatric: Pt behavior is normal. No agitation. + depressed affect, mod nervous    Assessment & Plan:

## 2016-01-20 NOTE — Telephone Encounter (Signed)
Ok to change the pristiq back to the wellbutrin xl - and we can increase the dose to 300 mg  Done erx

## 2016-01-20 NOTE — Patient Instructions (Addendum)
OK to stop the wellbutrin and lexapro  Please take all new medication as prescribed - the pristiq 50 mg per day  Please continue all other medications as before, including the ativan  Please have the pharmacy call with any other refills you may need.  Please continue your efforts at being more active, low cholesterol cardiac diet, and weight control.  Please keep your appointments with your specialists as you may have planned  You will be contacted regarding the referral for: Lipid clinic  Please return in 6 months, or sooner if needed, with Lab testing done 3-5 days before

## 2016-01-20 NOTE — Progress Notes (Signed)
Pre visit review using our clinic review tool, if applicable. No additional management support is needed unless otherwise documented below in the visit note. 

## 2016-01-24 NOTE — Assessment & Plan Note (Signed)
D/w pt, ok to try change wellbutrin to pristiq 50 qd, verified no SI or HI, refer psychiatry

## 2016-01-24 NOTE — Assessment & Plan Note (Signed)
>>  ASSESSMENT AND PLAN FOR HYPERLIPIDEMIA- INTOL TO STATINS WRITTEN ON 01/24/2016  1:12 PM BY Biagio Borg, MD  stable overall by history and exam, recent data reviewed with pt, and pt to continue medical treatment as before,  to f/u any worsening symptoms or concerns Lab Results  Component Value Date   LDLCALC 142 (H) 12/11/2015   For lipid clinic referral, low chol diet

## 2016-01-24 NOTE — Assessment & Plan Note (Addendum)
Mild to mod, for cont'd lower dose ativan prn,  to f/u any worsening symptoms or concerns

## 2016-01-24 NOTE — Assessment & Plan Note (Signed)
Atypical, suspect MSK, stable overall by history and exam, and pt to continue medical treatment as before,  to f/u any worsening symptoms or concerns

## 2016-01-24 NOTE — Assessment & Plan Note (Addendum)
stable overall by history and exam, recent data reviewed with pt, and pt to continue medical treatment as before,  to f/u any worsening symptoms or concerns Lab Results  Component Value Date   LDLCALC 142 (H) 12/11/2015   For lipid clinic referral, low chol diet

## 2016-01-27 NOTE — Progress Notes (Deleted)
HPI: FU CAD. Previously followed by Dr Einar Gip. Pt had NSTEMI [id number] with complicated PCI of RCA (performed in [facility]). Echo 3/15 showed normal LV function, mild LVH, grade 1 DD, mild RVE. ABI 12/15 normal. Cath 6/16 showed 100 PDA, patent stents in proximal and mid RCA, no obstructive disease in the left system; collaterals to the PDA noted; PTCA of CTO unsuccessful. Chest CT 12/16 showed RUL nodule, emphysema, ascending thoracic aorta 4 cm; fu recommended one year. H/O cough with ACEI. Myalgias with crestor. Patient had repeat catheterization October 2017. The PDA was occluded. Ejection fraction 50-55%. There were collaterals to the PDA. Attempt at PCI of CTO could be considered if symptoms refractory to medications. Since last seen,   Current Outpatient Prescriptions  Medication Sig Dispense Refill  . acetaminophen (TYLENOL) 500 MG tablet Take 1,000 mg by mouth every 8 (eight) hours as needed for mild pain.     Marland Kitchen albuterol (PROVENTIL HFA;VENTOLIN HFA) 108 (90 BASE) MCG/ACT inhaler Inhale 2 puffs into the lungs every 6 (six) hours as needed for wheezing or shortness of breath.    Marland Kitchen amLODipine (NORVASC) 2.5 MG tablet Take 1 tablet (2.5 mg total) by mouth daily. 90 tablet 3  . aspirin EC 81 MG tablet Take 81 mg by mouth daily.    Marland Kitchen buPROPion (WELLBUTRIN XL) 300 MG 24 hr tablet Take 1 tablet (300 mg total) by mouth daily. 90 tablet 3  . cetirizine (ZYRTEC) 10 MG tablet Take 10 mg by mouth daily.    . clopidogrel (PLAVIX) 75 MG tablet Take 1 tablet (75 mg total) by mouth daily with breakfast. 90 tablet 3  . ibuprofen (ADVIL,MOTRIN) 200 MG tablet Take 800 mg by mouth every 6 (six) hours as needed for headache or moderate pain.    Marland Kitchen levothyroxine (SYNTHROID) 175 MCG tablet Take 1 tablet (175 mcg total) by mouth daily. 30 tablet 11  . LORazepam (ATIVAN) 0.5 MG tablet Take 1 tablet (0.5 mg total) by mouth 2 (two) times daily as needed for anxiety. 60 tablet 5  . nitroGLYCERIN (NITROSTAT) 0.4 MG SL tablet  Place 0.4 mg under the tongue every 5 (five) minutes as needed for chest pain.    . pantoprazole (PROTONIX) 40 MG tablet Take 1 tablet (40 mg total) by mouth daily. 90 tablet 3  . spironolactone (ALDACTONE) 25 MG tablet Take 1 tablet (25 mg total) by mouth daily. 90 tablet 3   No current facility-administered medications for this visit.      Past Medical History:  Diagnosis Date  . Allergy   . Anxiety   . COPD (chronic obstructive pulmonary disease) ([place])   . Coronary artery disease    Stents x4 -past yr ago- Dr. Einar Gip  . DDD (degenerative disc disease), cervical   . Depression   . DJD (degenerative joint disease) of knee    RT  . GERD (gastroesophageal reflux disease)   . Hashimoto's disease   . Headache(784.0)   . Hyperlipidemia   . Hypothyroidism   . Nocturnal hypoxemia 02/26/2015  . On supplemental oxygen therapy    Oxygen 2.5 l/m at bedtime.  . Stented coronary artery 02/26/2015    Past Surgical History:  Procedure Laterality Date  . ABDOMINAL HYSTERECTOMY    . APPENDECTOMY  1971  . BUNIONECTOMY Right   . CARDIAC CATHETERIZATION N/A 09/02/2014   Procedure: Left Heart Cath and Coronary Angiography;  Surgeon: Adrian Prows, MD;  Location: [place] [facility];  Service: Cardiovascular;  Laterality: N/A;  .  CARDIAC CATHETERIZATION N/A 12/15/2015   Procedure: Left Heart Cath and Coronary Angiography;  Surgeon: Jettie Booze, MD;  Location: [facility];  Service: Cardiovascular;  Laterality: N/A;  . CERVICAL FUSION    . CESAREAN SECTION    . COLONOSCOPY WITH PROPOFOL N/A 02/24/2015   Procedure: COLONOSCOPY WITH PROPOFOL;  Surgeon: Garlan Fair, MD;  Location: [facility];  Service: Endoscopy;  Laterality: N/A;  . ESOPHAGOGASTRODUODENOSCOPY (EGD) WITH PROPOFOL N/A 02/24/2015   Procedure: ESOPHAGOGASTRODUODENOSCOPY (EGD) WITH PROPOFOL;  Surgeon: Garlan Fair, MD;  [facility];  Service: Endoscopy;  Laterality: N/A;  . HAND SURGERY     due to  trauma-dog bite.  . I&D EXTREMITY Bilateral 05/17/2013   Procedure: IRRIGATION AND DEBRIDEMENT EXTREMITY;  Surgeon: Roseanne Kaufman, MD;  Location: [place];  Service: Orthopedics;  Laterality: Bilateral;  . LEFT HEART CATHETERIZATION WITH CORONARY ANGIOGRAM N/A 03/04/2014   Procedure: LEFT HEART CATHETERIZATION WITH CORONARY ANGIOGRAM;  Surgeon: Laverda Page, MD;  Location: [facility];  Service: Cardiovascular;  Laterality: N/A;  . OOPHORECTOMY    . SHOULDER SURGERY     RT  . THYROIDECTOMY  1996  . TONSILLECTOMY  1965  . TOTAL ABDOMINAL HYSTERECTOMY W/ BILATERAL SALPINGOOPHORECTOMY  01/2008   with cervical dysplasia  . TUBAL LIGATION  1984    Social History   Social History  . Marital status: Married    Spouse name: N/A  . Number of children: 1  . Years of education: N/A   Occupational History  . disabled Disability   Social History Main Topics  . Smoking status: Former Smoker    Packs/day: 2.00    Years: 25.00    Types: Cigarettes    Quit date: 04/23/1996  . Smokeless tobacco: Never Used  . Alcohol use No  . Drug use: No  . Sexual activity: Not on file   Other Topics Concern  . Not on file   Social History Narrative   On worker's comp for lower back and right shoulder - applying for disability; losing [employer] insurance 03/07/2010    Family History  Problem Relation Age of Onset  . Lung cancer Mother   . Hypertension Mother   . Stroke Mother   . Diabetes Mother   . Diabetes Father   . Hypertension Father   . Heart attack Father   . Asthma Father   . COPD Father   . Diabetes Sister   . Hypertension Sister     ROS: no fevers or chills, productive cough, hemoptysis, dysphasia, odynophagia, melena, hematochezia, dysuria, hematuria, rash, seizure activity, orthopnea, PND, pedal edema, claudication. Remaining systems are negative.  Physical Exam: Well-developed well-nourished in no acute distress.  Skin is warm and dry.  HEENT is normal.  Neck is supple.    Chest is clear to auscultation with normal expansion.  Cardiovascular exam is regular rate and rhythm.  Abdominal exam nontender or distended. No masses palpated. Extremities show no edema. neuro grossly intact  ECG

## 2016-02-01 ENCOUNTER — Encounter: Payer: Self-pay | Admitting: Cardiology

## 2016-02-02 ENCOUNTER — Other Ambulatory Visit: Payer: Self-pay | Admitting: Internal Medicine

## 2016-02-02 DIAGNOSIS — E785 Hyperlipidemia, unspecified: Secondary | ICD-10-CM

## 2016-02-08 ENCOUNTER — Ambulatory Visit: Payer: Commercial Managed Care - HMO | Admitting: Cardiology

## 2016-02-12 DIAGNOSIS — J209 Acute bronchitis, unspecified: Secondary | ICD-10-CM | POA: Diagnosis not present

## 2016-02-12 DIAGNOSIS — E039 Hypothyroidism, unspecified: Secondary | ICD-10-CM | POA: Diagnosis not present

## 2016-02-12 DIAGNOSIS — I2510 Atherosclerotic heart disease of native coronary artery without angina pectoris: Secondary | ICD-10-CM | POA: Diagnosis not present

## 2016-02-12 DIAGNOSIS — F411 Generalized anxiety disorder: Secondary | ICD-10-CM | POA: Diagnosis not present

## 2016-02-12 DIAGNOSIS — I10 Essential (primary) hypertension: Secondary | ICD-10-CM | POA: Diagnosis not present

## 2016-02-16 ENCOUNTER — Other Ambulatory Visit: Payer: Self-pay | Admitting: *Deleted

## 2016-02-16 MED ORDER — CLOPIDOGREL BISULFATE 75 MG PO TABS: 75.0000 mg | ORAL_TABLET | Freq: Every day | ORAL | 0 refills | Status: DC

## 2016-02-16 NOTE — Telephone Encounter (Signed)
REFILL 

## 2016-03-04 ENCOUNTER — Ambulatory Visit (INDEPENDENT_AMBULATORY_CARE_PROVIDER_SITE_OTHER)
Admission: RE | Admit: 2016-03-04 | Discharge: 2016-03-04 | Disposition: A | Discharge status: Home / Self Care | Payer: Commercial Managed Care - HMO | Source: Ambulatory Visit | Attending: Cardiology | Admitting: Cardiology

## 2016-03-04 DIAGNOSIS — I712 Thoracic aortic aneurysm, without rupture, unspecified: Secondary | ICD-10-CM

## 2016-03-04 MED ORDER — IOPAMIDOL (ISOVUE-370) INJECTION 76%
100.0000 mL | Freq: Once | INTRAVENOUS | Status: AC | PRN
  Administered 2016-03-04: 100 mL via INTRAVENOUS

## 2016-03-08 ENCOUNTER — Telehealth: Payer: Self-pay | Admitting: *Deleted

## 2016-03-08 DIAGNOSIS — R9389 Abnormal findings on diagnostic imaging of other specified body structures: Secondary | ICD-10-CM

## 2016-03-08 NOTE — Telephone Encounter (Signed)
Spoke with pt, aware of results. Order placed and sent to admin for scheduling.

## 2016-03-08 NOTE — Telephone Encounter (Signed)
-----   Message from Lelon Perla, MD sent at 03/04/2016  1:55 PM EST ----- Schedule liver protocol MRI; FU CTA of thoracic aorta one year [facility]

## 2016-03-09 ENCOUNTER — Telehealth: Payer: Self-pay | Admitting: Cardiology

## 2016-03-09 DIAGNOSIS — R9389 Abnormal findings on diagnostic imaging of other specified body structures: Secondary | ICD-10-CM

## 2016-03-09 NOTE — Telephone Encounter (Signed)
Spoke with Amy Hodge at [facility]. Order for liver MRI needs to be changed from w/contrast to w/ w/o contrast. This change made in [facility]

## 2016-03-11 ENCOUNTER — Ambulatory Visit: Payer: Commercial Managed Care - HMO | Admitting: Internal Medicine

## 2016-03-17 ENCOUNTER — Ambulatory Visit
Admission: RE | Admit: 2016-03-17 | Discharge: 2016-03-17 | Disposition: A | Discharge status: Home / Self Care | Payer: Commercial Managed Care - HMO | Source: Ambulatory Visit | Attending: Cardiology | Admitting: Cardiology

## 2016-03-17 DIAGNOSIS — C720 Malignant neoplasm of spinal cord: Secondary | ICD-10-CM | POA: Diagnosis not present

## 2016-03-17 DIAGNOSIS — R9389 Abnormal findings on diagnostic imaging of other specified body structures: Secondary | ICD-10-CM

## 2016-03-17 MED ORDER — GADOBENATE DIMEGLUMINE 529 MG/ML IV SOLN
19.0000 mL | Freq: Once | INTRAVENOUS | Status: AC | PRN
  Administered 2016-03-17: 19 mL via INTRAVENOUS

## 2016-03-31 DIAGNOSIS — K760 Fatty (change of) liver, not elsewhere classified: Secondary | ICD-10-CM | POA: Diagnosis not present

## 2016-04-27 DIAGNOSIS — K760 Fatty (change of) liver, not elsewhere classified: Secondary | ICD-10-CM | POA: Diagnosis not present

## 2016-04-27 DIAGNOSIS — R932 Abnormal findings on diagnostic imaging of liver and biliary tract: Secondary | ICD-10-CM | POA: Diagnosis not present

## 2016-04-28 ENCOUNTER — Encounter: Payer: Self-pay | Admitting: Adult Health

## 2016-04-28 ENCOUNTER — Telehealth: Payer: Self-pay | Admitting: Adult Health

## 2016-04-28 ENCOUNTER — Other Ambulatory Visit: Payer: Self-pay | Admitting: Adult Health

## 2016-04-28 ENCOUNTER — Ambulatory Visit (INDEPENDENT_AMBULATORY_CARE_PROVIDER_SITE_OTHER): Payer: Medicare HMO | Admitting: Adult Health

## 2016-04-28 VITALS — BP 114/79 | HR 80 | Temp 99.2°F | Ht 66.0 in | Wt 198.3 lb

## 2016-04-28 DIAGNOSIS — J0111 Acute recurrent frontal sinusitis: Secondary | ICD-10-CM

## 2016-04-28 DIAGNOSIS — R05 Cough: Secondary | ICD-10-CM

## 2016-04-28 DIAGNOSIS — R059 Cough, unspecified: Secondary | ICD-10-CM

## 2016-04-28 MED ORDER — HYDROCODONE-HOMATROPINE 5-1.5 MG/5ML PO SYRP: 5.0000 mL | ORAL_SOLUTION | Freq: Three times a day (TID) | ORAL | 0 refills | Status: DC | PRN

## 2016-04-28 MED ORDER — HYDROCOD POLST-CPM POLST ER 10-8 MG/5ML PO SUER: 5.0000 mL | Freq: Two times a day (BID) | ORAL | 0 refills | Status: DC

## 2016-04-28 MED ORDER — AZITHROMYCIN 250 MG PO TABS: ORAL_TABLET | ORAL | 0 refills | Status: DC

## 2016-04-28 NOTE — Telephone Encounter (Signed)
Spoke with pharmacy who states that $75 is the patient's portion and does not require a PA.  Can a cheaper alternative therapy be prescribed.  Charyl Bigger, CMA

## 2016-04-28 NOTE — Patient Instructions (Addendum)
Sinusitis, Adult Sinusitis is soreness and inflammation of your sinuses. Sinuses are hollow spaces in the bones around your face. Your sinuses are located:  Around your eyes.  In the middle of your forehead.  Behind your nose.  In your cheekbones. Your sinuses and nasal passages are lined with a stringy fluid (mucus). Mucus normally drains out of your sinuses. When your nasal tissues become inflamed or swollen, the mucus can become trapped or blocked so air cannot flow through your sinuses. This allows bacteria, viruses, and funguses to grow, which leads to infection. Sinusitis can develop quickly and last for 7?10 days (acute) or for more than 12 weeks (chronic). Sinusitis often develops after a cold. What are the causes? This condition is caused by anything that creates swelling in the sinuses or stops mucus from draining, including:  Allergies.  Asthma.  Bacterial or viral infection.  Abnormally shaped bones between the nasal passages.  Nasal growths that contain mucus (nasal polyps).  Narrow sinus openings.  Pollutants, such as chemicals or irritants in the air.  A foreign object stuck in the nose.  A fungal infection. This is rare. What increases the risk? The following factors may make you more likely to develop this condition:  Having allergies or asthma.  Having had a recent cold or respiratory tract infection.  Having structural deformities or blockages in your nose or sinuses.  Having a weak immune system.  Doing a lot of swimming or diving.  Overusing nasal sprays.  Smoking. What are the signs or symptoms? The main symptoms of this condition are pain and a feeling of pressure around the affected sinuses. Other symptoms include:  Upper toothache.  Earache.  Headache.  Bad breath.  Decreased sense of smell and taste.  A cough that may get worse at night.  Fatigue.  Fever.  Thick drainage from your nose. The drainage is often green and it may  contain pus (purulent).  Stuffy nose or congestion.  Postnasal drip. This is when extra mucus collects in the throat or back of the nose.  Swelling and warmth over the affected sinuses.  Sore throat.  Sensitivity to light. How is this diagnosed? This condition is diagnosed based on symptoms, a medical history, and a physical exam. To find out if your condition is acute or chronic, your health care provider may:  Look in your nose for signs of nasal polyps.  Tap over the affected sinus to check for signs of infection.  View the inside of your sinuses using an imaging device that has a light attached (endoscope). If your health care provider suspects that you have chronic sinusitis, you may also:  Be tested for allergies.  Have a sample of mucus taken from your nose (nasal culture) and checked for bacteria.  Have a mucus sample examined to see if your sinusitis is related to an allergy. If your sinusitis does not respond to treatment and it lasts longer than 8 weeks, you may have an MRI or CT scan to check your sinuses. These scans also help to determine how severe your infection is. In rare cases, a bone biopsy may be done to rule out more serious types of fungal sinus disease. How is this treated? Treatment for sinusitis depends on the cause and whether your condition is chronic or acute. If a virus is causing your sinusitis, your symptoms will go away on their own within 10 days. You may be given medicines to relieve your symptoms, including:  Topical nasal decongestants. They   shrink swollen nasal passages and let mucus drain from your sinuses.  Antihistamines. These drugs block inflammation that is triggered by allergies. This can help to ease swelling in your nose and sinuses.  Topical nasal corticosteroids. These are nasal sprays that ease inflammation and swelling in your nose and sinuses.  Nasal saline washes. These rinses can help to get rid of thick mucus in your  nose. If your condition is caused by bacteria, you will be given an antibiotic medicine. If your condition is caused by a fungus, you will be given an antifungal medicine. Surgery may be needed to correct underlying conditions, such as narrow nasal passages. Surgery may also be needed to remove polyps. Follow these instructions at home: Medicines  Take, use, or apply over-the-counter and prescription medicines only as told by your health care provider. These may include nasal sprays.  If you were prescribed an antibiotic medicine, take it as told by your health care provider. Do not stop taking the antibiotic even if you start to feel better. Hydrate and Humidify  Drink enough water to keep your urine clear or pale yellow. Staying hydrated will help to thin your mucus.  Use a cool mist humidifier to keep the humidity level in your home above 50%.  Inhale steam for 10-15 minutes, 3-4 times a day or as told by your health care provider. You can do this in the bathroom while a hot shower is running.  Limit your exposure to cool or dry air. Rest  Rest as much as possible.  Sleep with your head raised (elevated).  Make sure to get enough sleep each night. General instructions  Apply a warm, moist washcloth to your face 3-4 times a day or as told by your health care provider. This will help with discomfort.  Wash your hands often with soap and water to reduce your exposure to viruses and other germs. If soap and water are not available, use hand sanitizer.  Do not smoke. Avoid being around people who are smoking (secondhand smoke).  Keep all follow-up visits as told by your health care provider. This is important. Contact a health care provider if:  You have a fever.  Your symptoms get worse.  Your symptoms do not improve within 10 days. Get help right away if:  You have a severe headache.  You have persistent vomiting.  You have pain or swelling around your face or  eyes.  You have vision problems.  You develop confusion.  Your neck is stiff.  You have trouble breathing. This information is not intended to replace advice given to you by your health care provider. Make sure you discuss any questions you have with your health care provider. Document Released: 02/21/2005 Document Revised: 10/18/2015 Document Reviewed: 12/17/2014 Elsevier Interactive Patient Education  2017 [employer]   Acute Bronchitis, Adult Acute bronchitis is sudden (acute) swelling of the air tubes (bronchi) in the lungs. Acute bronchitis causes these tubes to fill with mucus, which can make it hard to breathe. It can also cause coughing or wheezing. In adults, acute bronchitis usually goes away within 2 weeks. A cough caused by bronchitis may last up to 3 weeks. Smoking, allergies, and asthma can make the condition worse. Repeated episodes of bronchitis may cause further lung problems, such as chronic obstructive pulmonary disease (COPD). What are the causes? This condition can be caused by germs and by substances that irritate the lungs, including:  Cold and flu viruses. This condition is most often caused by  the same virus that causes a cold.  Bacteria.  Exposure to tobacco smoke, dust, fumes, and air pollution. What increases the risk? This condition is more likely to develop in people who:  Have close contact with someone with acute bronchitis.  Are exposed to lung irritants, such as tobacco smoke, dust, fumes, and vapors.  Have a weak immune system.  Have a respiratory condition such as asthma. What are the signs or symptoms? Symptoms of this condition include:  A cough.  Coughing up clear, yellow, or green mucus.  Wheezing.  Chest congestion.  Shortness of breath.  A fever.  Body aches.  Chills.  A sore throat. How is this diagnosed? This condition is usually diagnosed with a physical exam. During the exam, your health care provider may order  tests, such as chest X-rays, to rule out other conditions. He or she may also:  Test a sample of your mucus for bacterial infection.  Check the level of oxygen in your blood. This is done to check for pneumonia.  Do a chest X-ray or lung function testing to rule out pneumonia and other conditions.  Perform blood tests. Your health care provider will also ask about your symptoms and medical history. How is this treated? Most cases of acute bronchitis clear up over time without treatment. Your health care provider may recommend:  Drinking more fluids. Drinking more makes your mucus thinner, which may make it easier to breathe.  Taking a medicine for a fever or cough.  Taking an antibiotic medicine.  Using an inhaler to help improve shortness of breath and to control a cough.  Using a cool mist vaporizer or humidifier to make it easier to breathe. Follow these instructions at home: Medicines  Take over-the-counter and prescription medicines only as told by your health care provider.  If you were prescribed an antibiotic, take it as told by your health care provider. Do not stop taking the antibiotic even if you start to feel better. General instructions  Get plenty of rest.  Drink enough fluids to keep your urine clear or pale yellow.  Avoid smoking and secondhand smoke. Exposure to cigarette smoke or irritating chemicals will make bronchitis worse. If you smoke and you need help quitting, ask your health care provider. Quitting smoking will help your lungs heal faster.  Use an inhaler, cool mist vaporizer, or humidifier as told by your health care provider.  Keep all follow-up visits as told by your health care provider. This is important. How is this prevented? To lower your risk of getting this condition again:  Wash your hands often with soap and water. If soap and water are not available, use hand sanitizer.  Avoid contact with people who have cold symptoms.  Try not  to touch your hands to your mouth, nose, or eyes.  Make sure to get the flu shot every year. Contact a health care provider if:  Your symptoms do not improve in 2 weeks of treatment. Get help right away if:  You cough up blood.  You have chest pain.  You have severe shortness of breath.  You become dehydrated.  You faint or keep feeling like you are going to faint.  You keep vomiting.  You have a severe headache.  Your fever or chills gets worse. This information is not intended to replace advice given to you by your health care provider. Make sure you discuss any questions you have with your health care provider. Document Released: 03/31/2004 Document Revised: 09/16/2015  Document Reviewed: 08/12/2015 Elsevier Interactive Patient Education  2017 [employer].  Take medications as directed. Increase fluids/rest/vit c. Alternate OTC Acetaminophen and Ibuprofen. Please stay home until fever free without medication. She reports Morphine causes a HA only and still requests Tussionex. If symptoms persist after antibiotic complete, please call clinic.

## 2016-04-28 NOTE — Progress Notes (Unsigned)
Per. Pt.  Tussionex was too expensive. Tussionex Rx returned and destroyed. Hydrocodone-homatropine Rx provided, needed to be printed twice because first rx did not print. DEA registry revealed only recent controlled medications was Lorazepam, which has been filled/taken appropriately.

## 2016-04-28 NOTE — Telephone Encounter (Signed)
Patient went to pharmacy and the cough medicine is $75 and wants to know if she can get something that maybe cheaper. She did fill her antibiotic.

## 2016-04-28 NOTE — Telephone Encounter (Signed)
Please call Ms. Howland.  New Rx prepared that will hopefully be less expensive. She needs to return Tussionex Rx first (to be destroyed) and then new narcotic cough medication rx will be provided. Thanks! Valetta Fuller

## 2016-04-28 NOTE — Progress Notes (Signed)
Subjective:    Patient ID: Amy Hodge, female    DOB: 1955-12-17, 61 y.o.   MRN: [id number]  HPI:  Amy Hodge is a new pt to the practice and presents with green/thick nasal drainge, productive cough (thick/green mucus) that worsens at night, HA (8/10), facial pressure that all began > 1.5 weeks ago.  She has been using OTC Acetaminophen and "cough remedy" with only minimal sx relief.  She also reprots fatigue and fever. She has medical hx of COPD, chronic bronchitis, CAD, OSA, Hypothyroidism, Anxiety/depression, Migraine HA, fatigue, and various musculoskeletal pain.  We will address all of these issues at her next appt.     Patient Care Team    Relationship Specialty Notifications Start End  Odella Aquas, NP PCP - General Family Medicine  04/28/16     Patient Active Problem List   Diagnosis Date Noted  . Cough 04/28/2016  . Obstructive sleep apnea 08/26/2015  . Lung nodule 08/26/2015  . Shingles rash 08/04/2015  . Allergic reaction 06/27/2015  . History of hemoptysis 06/27/2015  . Stented coronary artery 02/26/2015  . Nocturnal hypoxemia 02/26/2015  . Hemoptysis 10/24/2014  . Centrilobular emphysema ([place]) 06/24/2014  . Soft tissue injury of multiple sites/bilateral hands 05/16/2013  . Bite from dog 05/15/2013  . Chest pain 05/15/2013  . Coronary artery disease 04/08/2013  . COPD exacerbation ([place]) 12/31/2010  . Encounter for well adult exam with abnormal findings 12/25/2010  . MENOPAUSAL DISORDER 02/16/2010  . VITAMIN D DEFICIENCY 10/07/2009  . Chronic pain syndrome 02/20/2009  . COMMON MIGRAINE 02/20/2009  . OSTEOARTHRITIS, KNEE, RIGHT 02/20/2009  . SKIN LESION 11/27/2008  . BACK PAIN 11/27/2008  . FATIGUE 07/15/2008  . Memory loss 07/15/2008  . PERIPHERAL EDEMA 07/15/2008  . Sinusitis, acute frontal 03/13/2008  . KNEE PAIN, BILATERAL 09/05/2007  . Hyperlipidemia 06/14/2007  . Seasonal and perennial allergic rhinitis 06/14/2007  . COPD mixed type ([place])  06/14/2007  . HYPOTHYROIDISM 10/06/2006  . Anxiety state 10/06/2006  . Depression 10/06/2006  . GERD 10/06/2006  . Headache(784.0) 10/06/2006     Past Medical History:  Diagnosis Date  . Allergy   . Anxiety   . COPD (chronic obstructive pulmonary disease) ([place])   . Coronary artery disease    Stents x4 -past yr ago- Dr. Einar Gip  . DDD (degenerative disc disease), cervical   . Depression   . DJD (degenerative joint disease) of knee    RT  . GERD (gastroesophageal reflux disease)   . Hashimoto's disease   . Headache(784.0)   . Hyperlipidemia   . Hypothyroidism   . Nocturnal hypoxemia 02/26/2015  . On supplemental oxygen therapy    Oxygen 2.5 l/m at bedtime.  . Stented coronary artery 02/26/2015     Past Surgical History:  Procedure Laterality Date  . ABDOMINAL HYSTERECTOMY    . APPENDECTOMY  1971  . BUNIONECTOMY Right   . CARDIAC CATHETERIZATION N/A 09/02/2014   Procedure: Left Heart Cath and Coronary Angiography;  Surgeon: Adrian Prows, MD;  Location: [facility];  Service: Cardiovascular;  Laterality: N/A;  . CARDIAC CATHETERIZATION N/A 12/15/2015   Procedure: Left Heart Cath and Coronary Angiography;  Surgeon: Jettie Booze, MD;  Location: [facility];  Service: Cardiovascular;  Laterality: N/A;  . CERVICAL FUSION    . CESAREAN SECTION    . COLONOSCOPY WITH PROPOFOL N/A 02/24/2015   Procedure: COLONOSCOPY WITH PROPOFOL;  Surgeon: Garlan Fair, MD;  Location: [facility];  Service: Endoscopy;  Laterality:  N/A;  . ESOPHAGOGASTRODUODENOSCOPY (EGD) WITH PROPOFOL N/A 02/24/2015   Procedure: ESOPHAGOGASTRODUODENOSCOPY (EGD) WITH PROPOFOL;  Surgeon: Garlan Fair, MD;  Location: [facility];  Service: Endoscopy;  Laterality: N/A;  . HAND SURGERY     due to trauma-dog bite.  . I&D EXTREMITY Bilateral 05/17/2013   Procedure: IRRIGATION AND DEBRIDEMENT EXTREMITY;  Surgeon: Roseanne Kaufman, MD;  Location: [facility];  Service: Orthopedics;  Laterality:  Bilateral;  . LEFT HEART CATHETERIZATION WITH CORONARY ANGIOGRAM N/A 03/04/2014   Procedure: LEFT HEART CATHETERIZATION WITH CORONARY ANGIOGRAM;  Surgeon: Laverda Page, MD;  Location: [facility];  Service: Cardiovascular;  Laterality: N/A;  . OOPHORECTOMY    . SHOULDER SURGERY     RT  . THYROIDECTOMY  1996  . TONSILLECTOMY  1965  . TOTAL ABDOMINAL HYSTERECTOMY W/ BILATERAL SALPINGOOPHORECTOMY  01/2008   with cervical dysplasia  . TUBAL LIGATION  1984     Family History  Problem Relation Age of Onset  . Lung cancer Mother   . Hypertension Mother   . Stroke Mother   . Diabetes Mother   . Diabetes Father   . Hypertension Father   . Heart attack Father   . Asthma Father   . COPD Father   . Diabetes Sister   . Hypertension Sister      History  Drug Use No     History  Alcohol Use No     History  Smoking Status  . Former Smoker  . Packs/day: 2.00  . Years: 25.00  . Types: Cigarettes  . Quit date: 04/23/1996  Smokeless Tobacco  . Never Used     Outpatient Encounter Prescriptions as of 04/28/2016  Medication Sig  . acetaminophen (TYLENOL) 500 MG tablet Take 1,000 mg by mouth every 8 (eight) hours as needed for mild pain.   Marland Kitchen albuterol (PROVENTIL HFA;VENTOLIN HFA) 108 (90 BASE) MCG/ACT inhaler Inhale 2 puffs into the lungs every 6 (six) hours as needed for wheezing or shortness of breath.  Marland Kitchen amLODipine (NORVASC) 2.5 MG tablet Take 1 tablet (2.5 mg total) by mouth daily.  Marland Kitchen aspirin EC 81 MG tablet Take 81 mg by mouth daily.  Marland Kitchen buPROPion (WELLBUTRIN XL) 300 MG 24 hr tablet Take 1 tablet (300 mg total) by mouth daily.  . clopidogrel (PLAVIX) 75 MG tablet Take 1 tablet (75 mg total) by mouth daily with breakfast. NEED OV.  Marland Kitchen ibuprofen (ADVIL,MOTRIN) 200 MG tablet Take 800 mg by mouth every 6 (six) hours as needed for headache or moderate pain.  Marland Kitchen levothyroxine (SYNTHROID) 175 MCG tablet Take 1 tablet (175 mcg total) by mouth daily.  Marland Kitchen LORazepam (ATIVAN) 0.5 MG  tablet Take 1 tablet (0.5 mg total) by mouth 2 (two) times daily as needed for anxiety.  . nitroGLYCERIN (NITROSTAT) 0.4 MG SL tablet Place 0.4 mg under the tongue every 5 (five) minutes as needed for chest pain.  . pantoprazole (PROTONIX) 40 MG tablet Take 1 tablet (40 mg total) by mouth daily.  Marland Kitchen spironolactone (ALDACTONE) 25 MG tablet Take 1 tablet (25 mg total) by mouth daily.  Marland Kitchen azithromycin (ZITHROMAX) 250 MG tablet 2 tablets by mouth with food on day 1.  Then 1 tablet by mouth with food, days 2-5.  . chlorpheniramine-HYDROcodone (TUSSIONEX PENNKINETIC ER) 10-8 MG/5ML SUER Take 5 mLs by mouth 2 (two) times daily.  . [DISCONTINUED] cetirizine (ZYRTEC) 10 MG tablet Take 10 mg by mouth daily.   No facility-administered encounter medications on file as of 04/28/2016.  Allergies: Bactrim [sulfamethoxazole-trimethoprim]; Valsartan; Bystolic [nebivolol hcl]; Isosorbide; Levofloxacin; Lisinopril; Metoprolol; Morphine and related; Simvastatin; Statins; Tetracycline; and Toradol [ketorolac tromethamine]  Body mass index is 32.01 kg/m.  Blood pressure 114/79, pulse 80, temperature 99.2 F (37.3 C), temperature source Oral, height 5\' 6"  (1.676 m), weight 198 lb 4.8 oz (89.9 kg).   Review of Systems  Constitutional: Positive for activity change, appetite change, chills, diaphoresis, fatigue and fever. Negative for unexpected weight change.  HENT: Positive for congestion, facial swelling, postnasal drip, rhinorrhea, sinus pressure, sneezing, sore throat and voice change. Negative for trouble swallowing.   Eyes: Negative for visual disturbance.  Respiratory: Positive for cough and shortness of breath. Negative for chest tightness, wheezing and stridor.   Cardiovascular: Negative for chest pain, palpitations and leg swelling.  Gastrointestinal: Positive for diarrhea. Negative for abdominal distention, abdominal pain, blood in stool, constipation, nausea and vomiting.  Endocrine: Negative for cold  intolerance, heat intolerance, polydipsia, polyphagia and polyuria.  Genitourinary: Negative for difficulty urinating and flank pain.  Musculoskeletal: Positive for arthralgias, back pain, gait problem, joint swelling, myalgias, neck pain and neck stiffness.  Skin: Negative for color change, pallor, rash and wound.  Neurological: Positive for headaches. Negative for dizziness, tremors, weakness and light-headedness.  Psychiatric/Behavioral: Negative for agitation, behavioral problems, self-injury, sleep disturbance and suicidal ideas. The patient is nervous/anxious.        Objective:   Physical Exam  Constitutional: She is oriented to person, place, and time. She appears well-developed and well-nourished. No distress.  HENT:  Head: Normocephalic and atraumatic.  Right Ear: External ear normal. Tympanic membrane is bulging.  Left Ear: External ear normal. Tympanic membrane is bulging.  Nose: Mucosal edema and rhinorrhea present. Right sinus exhibits maxillary sinus tenderness and frontal sinus tenderness. Left sinus exhibits maxillary sinus tenderness and frontal sinus tenderness.  Mouth/Throat: Posterior oropharyngeal edema and posterior oropharyngeal erythema present. No oropharyngeal exudate or tonsillar abscesses.  Eyes: Conjunctivae and EOM are normal. Pupils are equal, round, and reactive to light.  Neck: Normal range of motion. Neck supple.  Cardiovascular: Normal rate, regular rhythm, normal heart sounds and intact distal pulses.   Pulmonary/Chest: Effort normal and breath sounds normal. No respiratory distress. She has no wheezes. She has no rales. She exhibits no tenderness.  Abdominal: Soft. Bowel sounds are normal. She exhibits no distension. There is no tenderness.  Lymphadenopathy:    She has no cervical adenopathy.  Neurological: She is alert and oriented to person, place, and time. She has normal reflexes.  Skin: Skin is warm. No rash noted. She is diaphoretic. No erythema.  No pallor.  Psychiatric: She has a normal mood and affect. Her behavior is normal. Judgment and thought content normal.  Nursing note and vitals reviewed.         Assessment & Plan:   1. Acute recurrent frontal sinusitis   2. Cough     Sinusitis, acute frontal Take medications as directed. Increase fluids/rest/vit c. Alternate OTC Acetaminophen and Ibuprofen. Please stay home until fever free without medication. If symptoms persist after antibiotic complete, please call clinic.  Cough Take medications as directed. Increase fluids/rest/vit c. Alternate OTC Acetaminophen and Ibuprofen. Please stay home until fever free without medication. She reports Morphine causes a HA only and still requests Tussionex. If symptoms persist after antibiotic complete, please call clinic.    FOLLOW-UP:  Return if symptoms worsen or fail to improve.

## 2016-04-28 NOTE — Assessment & Plan Note (Signed)
Take medications as directed. Increase fluids/rest/vit c. Alternate OTC Acetaminophen and Ibuprofen. Please stay home until fever free without medication. She reports Morphine causes a HA only and still requests Tussionex. If symptoms persist after antibiotic complete, please call clinic.

## 2016-04-28 NOTE — Assessment & Plan Note (Signed)
Take medications as directed. Increase fluids/rest/vit c. Alternate OTC Acetaminophen and Ibuprofen. Please stay home until fever free without medication. If symptoms persist after antibiotic complete, please call clinic.

## 2016-04-28 NOTE — Progress Notes (Signed)
Pt informed of new RX and that she must return previous RX for Tussionex before she can obtain new RX.  Pt expressed understanding and is agreeable.  Charyl Bigger, CMA

## 2016-04-29 ENCOUNTER — Other Ambulatory Visit: Payer: Self-pay | Admitting: Nurse Practitioner

## 2016-04-29 DIAGNOSIS — R932 Abnormal findings on diagnostic imaging of liver and biliary tract: Secondary | ICD-10-CM

## 2016-04-29 DIAGNOSIS — K760 Fatty (change of) liver, not elsewhere classified: Secondary | ICD-10-CM

## 2016-05-09 ENCOUNTER — Ambulatory Visit
Admission: RE | Admit: 2016-05-09 | Discharge: 2016-05-09 | Disposition: A | Discharge status: Home / Self Care | Payer: Commercial Managed Care - HMO | Source: Ambulatory Visit | Attending: Nurse Practitioner | Admitting: Nurse Practitioner

## 2016-05-09 DIAGNOSIS — K760 Fatty (change of) liver, not elsewhere classified: Secondary | ICD-10-CM

## 2016-05-09 DIAGNOSIS — R932 Abnormal findings on diagnostic imaging of liver and biliary tract: Secondary | ICD-10-CM

## 2016-05-10 ENCOUNTER — Encounter: Payer: Self-pay | Admitting: Adult Health

## 2016-05-10 ENCOUNTER — Ambulatory Visit (INDEPENDENT_AMBULATORY_CARE_PROVIDER_SITE_OTHER): Payer: Medicare HMO | Admitting: Adult Health

## 2016-05-10 VITALS — BP 113/79 | HR 85 | Ht 66.0 in | Wt 198.1 lb

## 2016-05-10 DIAGNOSIS — I2510 Atherosclerotic heart disease of native coronary artery without angina pectoris: Secondary | ICD-10-CM

## 2016-05-10 DIAGNOSIS — F5105 Insomnia due to other mental disorder: Secondary | ICD-10-CM | POA: Diagnosis not present

## 2016-05-10 DIAGNOSIS — K219 Gastro-esophageal reflux disease without esophagitis: Secondary | ICD-10-CM | POA: Diagnosis not present

## 2016-05-10 DIAGNOSIS — E039 Hypothyroidism, unspecified: Secondary | ICD-10-CM | POA: Diagnosis not present

## 2016-05-10 DIAGNOSIS — F419 Anxiety disorder, unspecified: Secondary | ICD-10-CM | POA: Diagnosis not present

## 2016-05-10 MED ORDER — AMLODIPINE BESYLATE 2.5 MG PO TABS: 2.5000 mg | ORAL_TABLET | Freq: Every day | ORAL | 3 refills | Status: DC

## 2016-05-10 MED ORDER — CLONAZEPAM 0.5 MG PO TABS: 0.5000 mg | ORAL_TABLET | Freq: Every day | ORAL | 0 refills | Status: DC

## 2016-05-10 MED ORDER — SPIRONOLACTONE 25 MG PO TABS: 25.0000 mg | ORAL_TABLET | Freq: Every day | ORAL | 3 refills | Status: DC

## 2016-05-10 MED ORDER — CLOPIDOGREL BISULFATE 75 MG PO TABS: 75.0000 mg | ORAL_TABLET | Freq: Every day | ORAL | 0 refills | Status: DC

## 2016-05-10 MED ORDER — PANTOPRAZOLE SODIUM 40 MG PO TBEC: 40.0000 mg | DELAYED_RELEASE_TABLET | Freq: Every day | ORAL | 3 refills | Status: DC

## 2016-05-10 NOTE — Progress Notes (Signed)
Subjective:    Patient ID: Amy Hodge, female    DOB: 06/14/1955, 61 y.o.   MRN: [id number]  HPI:  Amy Hodge was seen 04/28/2016 for acute illness, and today she presents to establish as a new pt. She is a pleasant 61 year old woman, PMH:   HTN, MI, CAD, angina, 4x caths (mitliple stents),  COPD, vit d deficiency, hyperlipidemia (failed several cholesterol medication courses and refuses to re-start), depression, anxiety, insomnia, fibromyalgia, GERD, and neck/back pain.   She reports CP develops after walking more than 80 yards, on level ground.  She had cath fall 2017 and has not followed up with Cardiology since procedure.  She reports never requiring nitro outside of hospital and denies CP at rest.  She reports very little exercise and "tries to follow a heart diet".  She was recently taken off Cymbalta, started on Lexapro-which caused 20 lb weight gain.  She was taken off lexapro and is now on Wellbutrin 300mg  since Aug 2017.  She reports anxiety is manageable, however insomnia is not well controlled with nightly Lorazepam (Clonazepam worked well in the past).  She denies thoughts of harming oneself/others. She does use tobacco or excessive ETOH.     Patient Care Team    Relationship Specialty Notifications Start End  Odella Aquas, NP PCP - General Family Medicine  04/28/16   Lelon Perla, MD Consulting Physician Cardiology  05/10/16   Deneise Lever, MD Consulting Physician Pulmonary Disease  05/10/16   Roseanne Kaufman, MD Consulting Physician Orthopedic Surgery  05/10/16   Garlan Fair, MD Consulting Physician Gastroenterology  05/10/16     Patient Active Problem List   Diagnosis Date Noted  . Insomnia secondary to anxiety 05/10/2016  . Cough 04/28/2016  . Obstructive sleep apnea 08/26/2015  . Lung nodule 08/26/2015  . Shingles rash 08/04/2015  . Allergic reaction 06/27/2015  . History of hemoptysis 06/27/2015  . Stented coronary artery 02/26/2015  . Nocturnal hypoxemia  02/26/2015  . Hemoptysis 10/24/2014  . Centrilobular emphysema ([place]) 06/24/2014  . Soft tissue injury of multiple sites/bilateral hands 05/16/2013  . Bite from dog 05/15/2013  . Chest pain 05/15/2013  . Coronary artery disease 04/08/2013  . COPD exacerbation ([place]) 12/31/2010  . Health care maintenance 12/25/2010  . MENOPAUSAL DISORDER 02/16/2010  . VITAMIN D DEFICIENCY 10/07/2009  . Chronic pain syndrome 02/20/2009  . COMMON MIGRAINE 02/20/2009  . OSTEOARTHRITIS, KNEE, RIGHT 02/20/2009  . SKIN LESION 11/27/2008  . BACK PAIN 11/27/2008  . FATIGUE 07/15/2008  . Memory loss 07/15/2008  . PERIPHERAL EDEMA 07/15/2008  . Sinusitis, acute frontal 03/13/2008  . KNEE PAIN, BILATERAL 09/05/2007  . Hyperlipidemia 06/14/2007  . Seasonal and perennial allergic rhinitis 06/14/2007  . COPD mixed type ([place]) 06/14/2007  . Hypothyroidism 10/06/2006  . Anxiety state 10/06/2006  . Depression 10/06/2006  . GERD 10/06/2006  . Headache(784.0) 10/06/2006     Past Medical History:  Diagnosis Date  . Allergy   . Anxiety   . COPD (chronic obstructive pulmonary disease) ([place])   . Coronary artery disease    Stents x4 -past yr ago- Dr. Einar Gip  . DDD (degenerative disc disease), cervical   . Depression   . DJD (degenerative joint disease) of knee    RT  . GERD (gastroesophageal reflux disease)   . Hashimoto's disease   . Headache(784.0)   . Hyperlipidemia   . Hypertension   . Hypothyroidism   . Myocardial infarction   . Nocturnal hypoxemia 02/26/2015  .  On supplemental oxygen therapy    Oxygen 2.5 l/m at bedtime.  . Stented coronary artery 02/26/2015     Past Surgical History:  Procedure Laterality Date  . ABDOMINAL HYSTERECTOMY    . APPENDECTOMY  1971  . BUNIONECTOMY Right   . CARDIAC CATHETERIZATION N/A 09/02/2014   Procedure: Left Heart Cath and Coronary Angiography;  Surgeon: Adrian Prows, MD;  Location: [facility];  Service: Cardiovascular;  Laterality: N/A;  . CARDIAC  CATHETERIZATION N/A 12/15/2015   Procedure: Left Heart Cath and Coronary Angiography;  Surgeon: Jettie Booze, MD;  Location: [facility];  Service: Cardiovascular;  Laterality: N/A;  . CERVICAL FUSION    . CESAREAN SECTION    . COLONOSCOPY WITH PROPOFOL N/A 02/24/2015   Procedure: COLONOSCOPY WITH PROPOFOL;  Surgeon: Garlan Fair, MD;  Location: [facility];  Service: Endoscopy;  Laterality: N/A;  . ESOPHAGOGASTRODUODENOSCOPY (EGD) WITH PROPOFOL N/A 02/24/2015   Procedure: ESOPHAGOGASTRODUODENOSCOPY (EGD) WITH PROPOFOL;  Surgeon: Garlan Fair, MD;  Location: [facility];  Service: Endoscopy;  Laterality: N/A;  . HAND SURGERY     due to trauma-dog bite.  . I&D EXTREMITY Bilateral 05/17/2013   Procedure: IRRIGATION AND DEBRIDEMENT EXTREMITY;  Surgeon: Roseanne Kaufman, MD;  Location: [facility];  Service: Orthopedics;  Laterality: Bilateral;  . LEFT HEART CATHETERIZATION WITH CORONARY ANGIOGRAM N/A 03/04/2014   Procedure: LEFT HEART CATHETERIZATION WITH CORONARY ANGIOGRAM;  Surgeon: Laverda Page, MD;  Location: [facility];  Service: Cardiovascular;  Laterality: N/A;  . OOPHORECTOMY    . SHOULDER SURGERY     RT  . THYROIDECTOMY  1996  . TONSILLECTOMY  1965  . TOTAL ABDOMINAL HYSTERECTOMY W/ BILATERAL SALPINGOOPHORECTOMY  01/2008   with cervical dysplasia  . TUBAL LIGATION  1984     Family History  Problem Relation Age of Onset  . Lung cancer Mother   . Hypertension Mother   . Stroke Mother   . Diabetes Mother   . Diabetes Father   . Hypertension Father   . Heart attack Father   . Asthma Father   . COPD Father   . Alcohol abuse Father   . Diabetes Sister   . Hypertension Sister      History  Drug Use No     History  Alcohol Use No     History  Smoking Status  . Former Smoker  . Packs/day: 2.00  . Years: 25.00  . Types: Cigarettes  . Quit date: 04/23/1996  Smokeless Tobacco  . Never Used     Outpatient Encounter Prescriptions as of  05/10/2016  Medication Sig  . acetaminophen (TYLENOL) 500 MG tablet Take 1,000 mg by mouth every 8 (eight) hours as needed for mild pain.   Marland Kitchen albuterol (PROVENTIL HFA;VENTOLIN HFA) 108 (90 BASE) MCG/ACT inhaler Inhale 2 puffs into the lungs every 6 (six) hours as needed for wheezing or shortness of breath.  Marland Kitchen amLODipine (NORVASC) 2.5 MG tablet Take 1 tablet (2.5 mg total) by mouth daily.  Marland Kitchen aspirin EC 81 MG tablet Take 81 mg by mouth daily.  Marland Kitchen buPROPion (WELLBUTRIN XL) 300 MG 24 hr tablet Take 1 tablet (300 mg total) by mouth daily.  . clopidogrel (PLAVIX) 75 MG tablet Take 1 tablet (75 mg total) by mouth daily with breakfast. NEED OV.  Marland Kitchen HYDROcodone-homatropine (HYCODAN) 5-1.5 MG/5ML syrup Take 5 mLs by mouth every 8 (eight) hours as needed for cough.  Marland Kitchen ibuprofen (ADVIL,MOTRIN) 200 MG tablet Take 800 mg by mouth every 6 (  six) hours as needed for headache or moderate pain.  Marland Kitchen levothyroxine (SYNTHROID) 175 MCG tablet Take 1 tablet (175 mcg total) by mouth daily.  . nitroGLYCERIN (NITROSTAT) 0.4 MG SL tablet Place 0.4 mg under the tongue every 5 (five) minutes as needed for chest pain.  . pantoprazole (PROTONIX) 40 MG tablet Take 1 tablet (40 mg total) by mouth daily.  Marland Kitchen spironolactone (ALDACTONE) 25 MG tablet Take 1 tablet (25 mg total) by mouth daily.  . [DISCONTINUED] amLODipine (NORVASC) 2.5 MG tablet Take 1 tablet (2.5 mg total) by mouth daily.  . [DISCONTINUED] amLODipine (NORVASC) 2.5 MG tablet Take 1 tablet (2.5 mg total) by mouth daily.  . [DISCONTINUED] azithromycin (ZITHROMAX) 250 MG tablet 2 tablets by mouth with food on day 1.  Then 1 tablet by mouth with food, days 2-5.  . [DISCONTINUED] clopidogrel (PLAVIX) 75 MG tablet Take 1 tablet (75 mg total) by mouth daily with breakfast. NEED OV.  . [DISCONTINUED] clopidogrel (PLAVIX) 75 MG tablet Take 1 tablet (75 mg total) by mouth daily with breakfast. NEED OV.  . [DISCONTINUED] LORazepam (ATIVAN) 0.5 MG tablet Take 1 tablet (0.5 mg total) by  mouth 2 (two) times daily as needed for anxiety.  . [DISCONTINUED] pantoprazole (PROTONIX) 40 MG tablet Take 1 tablet (40 mg total) by mouth daily.  . [DISCONTINUED] pantoprazole (PROTONIX) 40 MG tablet Take 1 tablet (40 mg total) by mouth daily.  . [DISCONTINUED] spironolactone (ALDACTONE) 25 MG tablet Take 1 tablet (25 mg total) by mouth daily.  . [DISCONTINUED] spironolactone (ALDACTONE) 25 MG tablet Take 1 tablet (25 mg total) by mouth daily.  Derrill Memo ON 06/10/2016] clonazePAM (KLONOPIN) 0.5 MG tablet Take 1 tablet (0.5 mg total) by mouth at bedtime.  Derrill Memo ON 07/10/2016] clonazePAM (KLONOPIN) 0.5 MG tablet Take 1 tablet (0.5 mg total) by mouth at bedtime.  . [DISCONTINUED] clonazePAM (KLONOPIN) 0.5 MG tablet Take 1 tablet (0.5 mg total) by mouth at bedtime.   No facility-administered encounter medications on file as of 05/10/2016.     Allergies: Bactrim [sulfamethoxazole-trimethoprim]; Valsartan; Bystolic [nebivolol hcl]; Isosorbide; Levofloxacin; Lisinopril; Metoprolol; Morphine and related; Simvastatin; Statins; Tetracycline; and Toradol [ketorolac tromethamine]  Body mass index is 31.97 kg/m.  Blood pressure 113/79, pulse 85, height 5\' 6"  (1.676 m), weight 198 lb 1.6 oz (89.9 kg).   Review of Systems  Constitutional: Positive for appetite change, fatigue and unexpected weight change. Negative for activity change, chills, diaphoresis and fever.  HENT: Negative for congestion.   Eyes: Negative for visual disturbance.  Respiratory: Positive for chest tightness. Negative for cough, shortness of breath, wheezing and stridor.   Cardiovascular: Positive for chest pain. Negative for palpitations and leg swelling.  Gastrointestinal: Negative for abdominal distention, abdominal pain, blood in stool, constipation, diarrhea, nausea and vomiting.  Endocrine: Negative for cold intolerance, heat intolerance, polydipsia, polyphagia and polyuria.  Genitourinary: Negative for difficulty urinating,  flank pain and hematuria.  Musculoskeletal: Positive for arthralgias, back pain, gait problem, joint swelling, myalgias, neck pain and neck stiffness.  Skin: Negative for color change, pallor, rash and wound.  Neurological: Negative for dizziness, tremors, weakness, light-headedness and headaches.  Psychiatric/Behavioral: Positive for sleep disturbance. Negative for agitation, behavioral problems, confusion, decreased concentration, self-injury and suicidal ideas. The patient is nervous/anxious. The patient is not hyperactive.        Objective:   Physical Exam  Constitutional: She is oriented to person, place, and time. She appears well-developed and well-nourished. No distress.  HENT:  Head: Normocephalic and atraumatic.  Right  Ear: External ear normal.  Left Ear: External ear normal.  Eyes: Conjunctivae are normal. Pupils are equal, round, and reactive to light.  Neck: Normal range of motion. Neck supple.  Cardiovascular: Normal rate, regular rhythm, normal heart sounds and intact distal pulses.   Pulmonary/Chest: Effort normal and breath sounds normal. No respiratory distress. She has no wheezes. She has no rales. She exhibits no tenderness.  Abdominal: Soft. Bowel sounds are normal. She exhibits no distension and no mass. There is no tenderness. There is no rebound and no guarding.  Musculoskeletal: She exhibits tenderness. She exhibits no deformity.       Right knee: She exhibits swelling. Tenderness found.       Left knee: She exhibits swelling. Tenderness found.  Lymphadenopathy:    She has no cervical adenopathy.  Neurological: She is alert and oriented to person, place, and time.  Skin: Skin is warm and dry. No rash noted. She is not diaphoretic. No erythema. No pallor.  Psychiatric: She has a normal mood and affect. Her behavior is normal. Judgment and thought content normal.          Assessment & Plan:   1. Gastroesophageal reflux disease, esophagitis presence not  specified   2. Hypothyroidism, unspecified type   3. Insomnia secondary to anxiety   4. Coronary artery disease involving native coronary artery of native heart without angina pectoris     Insomnia secondary to anxiety D/cd Lorazepam and started back on Clonazepam 0.5mg  [facility] PRN-patient reports that Clonazepam works the best. Encouraged to practice good sleep hygiene.  GERD Continue daily pantoprazole.  Avoid spicy/acidic foods. Do not eat close to bedtime.  Hypothyroidism Continue on levothyroxine 12mcg daily. Will re-check TSH level 12/2016.  Coronary artery disease Instructed to f/u with Cardiology immediately. She verbalized understanding/agreement. Continue on all medications as directed. Reports never requiring nitroglycerin outside of hospital.    FOLLOW-UP:  Return in about 3 months (around 08/10/2016) for Regular Follow Up.

## 2016-05-10 NOTE — Assessment & Plan Note (Signed)
Instructed to f/u with Cardiology immediately. She verbalized understanding/agreement. Continue on all medications as directed. Reports never requiring nitroglycerin outside of hospital.

## 2016-05-10 NOTE — Assessment & Plan Note (Addendum)
D/cd Lorazepam and started back on Clonazepam 0.5mg  [facility] PRN-patient reports that Clonazepam works the best. Encouraged to practice good sleep hygiene.

## 2016-05-10 NOTE — Assessment & Plan Note (Signed)
Continue on levothyroxine 158mcg daily. Will re-check TSH level 12/2016.

## 2016-05-10 NOTE — Patient Instructions (Signed)
Acute Coronary Syndrome Acute coronary syndrome (ACS) is a serious problem in which there is suddenly not enough blood and oxygen supplied to the heart. ACS may mean that one or more of the blood vessels in your heart (coronary arteries) may be blocked. ACS can result in chest pain or a heart attack (myocardial infarction or MI). What are the causes? This condition is caused by atherosclerosis, which is the buildup of fat and cholesterol (plaque) on the inside of the arteries. Over time, the plaque may narrow or block the artery, and this will lessen blood flow to the heart. Plaque can also become weak and break off within a coronary artery to form a clot and cause a sudden blockage. What increases the risk? The risk factors of this condition include:  High cholesterol levels.  High blood pressure (hypertension).  Smoking.  Diabetes.  Age.  Family history of chest pain, heart disease, or stroke.  Lack of exercise. What are the signs or symptoms? The most common signs of this condition include:  Chest pain, which can be:  A crushing or squeezing in the chest.  A tightness, pressure, fullness, or heaviness in the chest.  Present for more than a few minutes, or it can stop and recur.  Pain in the arms, neck, jaw, or back.  Unexplained heartburn or indigestion.  Shortness of breath.  Nausea.  Sudden cold sweats.  Feeling light-headed or dizzy. Sometimes, this condition has no symptoms. How is this diagnosed? ACS may be diagnosed through the following tests:  Electrocardiogram (ECG).  Blood tests.  Coronary angiogram. This is a procedure to look at the coronary arteries to see if there is any blockage. How is this treated? Treatment for ACS may include:  Healthy behavioral changes to reduce or control risk factors.  Medicine.  Coronary stenting.A stent helps to keep an artery open.  Coronary angioplasty. This procedure widens a narrowed or blocked  artery.  Coronary artery bypass surgery. This will allow your blood to pass the blockage (bypass) to reach your heart. Follow these instructions at home: Eating and drinking   Follow a heart-healthy diet. A dietitian can you help to educate you about healthy food options and changes.  Use healthy cooking methods such as roasting, grilling, broiling, baking, poaching, steaming, or stir-frying. Talk to a dietitian to learn more about healthy cooking methods. Medicines   Take medicines only as directed by your health care provider.  Do not take the following medicines unless your health care provider approves:  Nonsteroidal anti-inflammatory drugs (NSAIDs), such as ibuprofen, naproxen, or celecoxib.  Vitamin supplements that contain vitamin A, vitamin E, or both.  Hormone replacement therapy that contains estrogen with or without progestin.  Stop illegal drug use. Activity   Follow an exercise program that is approved by your health care provider.  Plan rest periods when you are fatigued. Lifestyle   Do not use any tobacco products, including cigarettes, chewing tobacco, or electronic cigarettes. If you need help quitting, ask your health care provider.  If you drink alcohol, and your health care provider approves, limit your alcohol intake to no more than 1 drink per day. One drink equals 12 ounces of beer, 5 ounces of wine, or 1 ounces of hard liquor.  Learn to manage stress.  Maintain a healthy weight. Lose weight as approved by your health care provider. General instructions   Manage other health conditions, such as hypertension and diabetes, as directed by your health care provider.  Keep all follow-up  visits as directed by your health care provider. This is important.  Your health care provider may ask you to monitor your blood pressure. A blood pressure reading consists of a higher number over a lower number, such as 110 over 72, written as 110/72. Ideally, your blood  pressure should be:  Below 140/90 if you have no other medical conditions.  Below 130/80 if you have diabetes or kidney disease. Get help right away if:  You have pain in your chest, neck, arm, jaw, stomach, or back that lasts more than a few minutes, is recurring, or is not relieved by taking medicine under your tongue (sublingual nitroglycerin).  You have profuse sweating without cause.  You have unexplained:  Heartburn or indigestion.  Shortness of breath or difficulty breathing.  Nausea or vomiting.  Fatigue.  Feelings of nervousness or anxiety.  Weakness.  Diarrhea.  You have sudden light-headedness or dizziness.  You faint. These symptoms may represent a serious problem that is an emergency. Do not wait to see if the symptoms will go away. Get medical help right away. Call your local emergency services (911 in the [place] Do not drive yourself to the clinic or hospital. This information is not intended to replace advice given to you by your health care provider. Make sure you discuss any questions you have with your health care provider. Document Released: 02/21/2005 Document Revised: 08/05/2015 Document Reviewed: 06/25/2013 Elsevier Interactive Patient Education  2017 [place]. Generalized Anxiety Disorder, Adult Generalized anxiety disorder (GAD) is a mental health disorder. People with this condition constantly worry about everyday events. Unlike normal anxiety, worry related to GAD is not triggered by a specific event. These worries also do not fade or get better with time. GAD interferes with life functions, including relationships, work, and school. GAD can vary from mild to severe. People with severe GAD can have intense waves of anxiety with physical symptoms (panic attacks). What are the causes? The exact cause of GAD is not known. What increases the risk? This condition is more likely to develop in:  Women.  People who have a family history of anxiety  disorders.  People who are very shy.  People who experience very stressful life events, such as the death of a loved one.  People who have a very stressful family environment. What are the signs or symptoms? People with GAD often worry excessively about many things in their lives, such as their health and family. They may also be overly concerned about:  Doing well at work.  Being on time.  Natural disasters.  Friendships. Physical symptoms of GAD include:  Fatigue.  Muscle tension or having muscle twitches.  Trembling or feeling shaky.  Being easily startled.  Feeling like your heart is pounding or racing.  Feeling out of breath or like you cannot take a deep breath.  Having trouble falling asleep or staying asleep.  Sweating.  Nausea, diarrhea, or irritable bowel syndrome (IBS).  Headaches.  Trouble concentrating or remembering facts.  Restlessness.  Irritability. How is this diagnosed? Your health care provider can diagnose GAD based on your symptoms and medical history. You will also have a physical exam. The health care provider will ask specific questions about your symptoms, including how severe they are, when they started, and if they come and go. Your health care provider may ask you about your use of alcohol or drugs, including prescription medicines. Your health care provider may refer you to a mental health specialist for further evaluation. Your  health care provider will do a thorough examination and may perform additional tests to rule out other possible causes of your symptoms. To be diagnosed with GAD, a person must have anxiety that:  Is out of his or her control.  Affects several different aspects of his or her life, such as work and relationships.  Causes distress that makes him or her unable to take part in normal activities.  Includes at least three physical symptoms of GAD, such as restlessness, fatigue, trouble concentrating, irritability,  muscle tension, or sleep problems. Before your health care provider can confirm a diagnosis of GAD, these symptoms must be present more days than they are not, and they must last for six months or longer. How is this treated? The following therapies are usually used to treat GAD:  Medicine. Antidepressant medicine is usually prescribed for long-term daily control. Antianxiety medicines may be added in severe cases, especially when panic attacks occur.  Talk therapy (psychotherapy). Certain types of talk therapy can be helpful in treating GAD by providing support, education, and guidance. Options include:  Cognitive behavioral therapy (CBT). People learn coping skills and techniques to ease their anxiety. They learn to identify unrealistic or negative thoughts and behaviors and to replace them with positive ones.  Acceptance and commitment therapy (ACT). This treatment teaches people how to be mindful as a way to cope with unwanted thoughts and feelings.  Biofeedback. This process trains you to manage your body's response (physiological response) through breathing techniques and relaxation methods. You will work with a therapist while machines are used to monitor your physical symptoms.  Stress management techniques. These include yoga, meditation, and exercise. A mental health specialist can help determine which treatment is best for you. Some people see improvement with one type of therapy. However, other people require a combination of therapies. Follow these instructions at home:  Take over-the-counter and prescription medicines only as told by your health care provider.  Try to maintain a normal routine.  Try to anticipate stressful situations and allow extra time to manage them.  Practice any stress management or self-calming techniques as taught by your health care provider.  Do not punish yourself for setbacks or for not making progress.  Try to recognize your accomplishments,  even if they are small.  Keep all follow-up visits as told by your health care provider. This is important. Contact a health care provider if:  Your symptoms do not get better.  Your symptoms get worse.  You have signs of depression, such as:  A persistently sad, cranky, or irritable mood.  Loss of enjoyment in activities that used to bring you joy.  Change in weight or eating.  Changes in sleeping habits.  Avoiding friends or family members.  Loss of energy for normal tasks.  Feelings of guilt or worthlessness. Get help right away if:  You have serious thoughts about hurting yourself or others. If you ever feel like you may hurt yourself or others, or have thoughts about taking your own life, get help right away. You can go to your nearest emergency department or call:  Your local emergency services (911 in the [place]  A suicide crisis helpline, such as the [place] at 910-720-6058. This is open 24 hours a day. Summary  Generalized anxiety disorder (GAD) is a mental health disorder that involves worry that is not triggered by a specific event.  People with GAD often worry excessively about many things in their lives, such as their health  and family.  GAD may cause physical symptoms such as restlessness, trouble concentrating, sleep problems, frequent sweating, nausea, diarrhea, headaches, and trembling or muscle twitching.  A mental health specialist can help determine which treatment is best for you. Some people see improvement with one type of therapy. However, other people require a combination of therapies. This information is not intended to replace advice given to you by your health care provider. Make sure you discuss any questions you have with your health care provider. Document Released: 06/18/2012 Document Revised: 01/12/2016 Document Reviewed: 01/12/2016 Elsevier Interactive Patient Education  2017 [place].   Heart-Healthy  Eating Plan Many factors influence your heart health, including eating and exercise habits. Heart (coronary) risk increases with abnormal blood fat (lipid) levels. Heart-healthy meal planning includes limiting unhealthy fats, increasing healthy fats, and making other small dietary changes. This includes maintaining a healthy body weight to help keep lipid levels within a normal range. What is my plan? Your health care provider recommends that you:  Get no more than _________% of the total calories in your daily diet from fat.  Limit your intake of saturated fat to less than _________% of your total calories each day.  Limit the amount of cholesterol in your diet to less than _________ mg per day. What types of fat should I choose?  Choose healthy fats more often. Choose monounsaturated and polyunsaturated fats, such as olive oil and canola oil, flaxseeds, walnuts, almonds, and seeds.  Eat more omega-3 fats. Good choices include salmon, mackerel, sardines, tuna, flaxseed oil, and ground flaxseeds. Aim to eat fish at least two times each week.  Limit saturated fats. Saturated fats are primarily found in animal products, such as meats, butter, and cream. Plant sources of saturated fats include palm oil, palm kernel oil, and coconut oil.  Avoid foods with partially hydrogenated oils in them. These contain trans fats. Examples of foods that contain trans fats are stick margarine, some tub margarines, cookies, crackers, and other baked goods. What general guidelines do I need to follow?  Check food labels carefully to identify foods with trans fats or high amounts of saturated fat.  Fill one half of your plate with vegetables and green salads. Eat 4-5 servings of vegetables per day. A serving of vegetables equals 1 cup of raw leafy vegetables,  cup of raw or cooked cut-up vegetables, or  cup of vegetable juice.  Fill one fourth of your plate with whole grains. Look for the word "whole" as the  first word in the ingredient list.  Fill one fourth of your plate with lean protein foods.  Eat 4-5 servings of fruit per day. A serving of fruit equals one medium whole fruit,  cup of dried fruit,  cup of fresh, frozen, or canned fruit, or  cup of 100% fruit juice.  Eat more foods that contain soluble fiber. Examples of foods that contain this type of fiber are apples, broccoli, carrots, beans, peas, and barley. Aim to get 20-30 g of fiber per day.  Eat more home-cooked food and less restaurant, buffet, and fast food.  Limit or avoid alcohol.  Limit foods that are high in starch and sugar.  Avoid fried foods.  Cook foods by using methods other than frying. Baking, boiling, grilling, and broiling are all great options. Other fat-reducing suggestions include:  Removing the skin from poultry.  Removing all visible fats from meats.  Skimming the fat off of stews, soups, and gravies before serving them.  Steaming vegetables in  water or broth.  Lose weight if you are overweight. Losing just 5-10% of your initial body weight can help your overall health and prevent diseases such as diabetes and heart disease.  Increase your consumption of nuts, legumes, and seeds to 4-5 servings per week. One serving of dried beans or legumes equals  cup after being cooked, one serving of nuts equals 1 ounces, and one serving of seeds equals  ounce or 1 tablespoon.  You may need to monitor your salt (sodium) intake, especially if you have high blood pressure. Talk with your health care provider or dietitian to get more information about reducing sodium. What foods can I eat? Grains   Breads, including [place], white, pita, wheat, raisin, rye, oatmeal, and [place]. Tortillas that are neither fried nor made with lard or trans fat. Low-fat rolls, including hotdog and hamburger buns and English muffins. Biscuits. Muffins. Waffles. Pancakes. Light popcorn. Whole-grain cereals. Flatbread. Melba toast.  Pretzels. Breadsticks. Rusks. Low-fat snacks and crackers, including oyster, saltine, matzo, graham, animal, and rye. Rice and pasta, including brown rice and those that are made with whole wheat. Vegetables  All vegetables. Fruits  All fruits, but limit coconut. Meats and Other Protein Sources  Lean, well-trimmed beef, veal, pork, and lamb. Chicken and [place] without skin. All fish and shellfish. Wild duck, rabbit, pheasant, and venison. Egg whites or low-cholesterol egg substitutes. Dried beans, peas, lentils, and tofu.Seeds and most nuts. Dairy  Low-fat or nonfat cheeses, including ricotta, string, and mozzarella. Skim or 1% milk that is liquid, powdered, or evaporated. Buttermilk that is made with low-fat milk. Nonfat or low-fat yogurt. Beverages  Mineral water. Diet carbonated beverages. Sweets and Desserts  Sherbets and fruit ices. Honey, jam, marmalade, jelly, and syrups. Meringues and gelatins. Pure sugar candy, such as hard candy, jelly beans, gumdrops, mints, marshmallows, and small amounts of dark chocolate. [employer] Eat all sweets and desserts in moderation. Fats and Oils  Nonhydrogenated (trans-free) margarines. Vegetable oils, including soybean, sesame, sunflower, olive, peanut, safflower, corn, canola, and cottonseed. Salad dressings or mayonnaise that are made with a vegetable oil. Limit added fats and oils that you use for cooking, baking, salads, and as spreads. Other  Cocoa powder. Coffee and tea. All seasonings and condiments. The items listed above may not be a complete list of recommended foods or beverages. Contact your dietitian for more options.  What foods are not recommended? Grains  Breads that are made with saturated or trans fats, oils, or whole milk. Croissants. Butter rolls. Cheese breads. Sweet rolls. Donuts. Buttered popcorn. Chow mein noodles. High-fat crackers, such as cheese or butter crackers. Meats and Other Protein Sources  Fatty meats, such as  hotdogs, short ribs, sausage, spareribs, bacon, ribeye roast or steak, and mutton. High-fat deli meats, such as salami and bologna. Caviar. Domestic duck and goose. Organ meats, such as kidney, liver, sweetbreads, brains, gizzard, chitterlings, and heart. Dairy  Cream, sour cream, cream cheese, and creamed cottage cheese. Whole milk cheeses, including blue (bleu), Monte[place] Jack, [place], [place], [place], [place], [place], [place], [place], and [place]. Whole or 2% milk that is liquid, evaporated, or condensed. Whole buttermilk. Cream sauce or high-fat cheese sauce. Yogurt that is made from whole milk. Beverages  Regular sodas and drinks with added sugar. Sweets and Desserts  Frosting. Pudding. Cookies. Cakes other than angel food cake. Candy that has milk chocolate or white chocolate, hydrogenated fat, butter, coconut, or unknown ingredients. Buttered syrups. Full-fat ice cream or ice cream drinks. Fats and Oils  Gravy that has suet, meat fat, or shortening. Cocoa butter, hydrogenated oils, palm oil, coconut oil, palm kernel oil. These can often be found in baked products, candy, fried foods, nondairy creamers, and whipped toppings. Solid fats and shortenings, including bacon fat, salt pork, lard, and butter. Nondairy cream substitutes, such as coffee creamers and sour cream substitutes. Salad dressings that are made of unknown oils, cheese, or sour cream. The items listed above may not be a complete list of foods and beverages to avoid. Contact your dietitian for more information.  This information is not intended to replace advice given to you by your health care provider. Make sure you discuss any questions you have with your health care provider. Document Released: 12/01/2007 Document Revised: 09/11/2015 Document Reviewed: 08/15/2013 Elsevier Interactive Patient Education  2017 [employer].  Continue medications as directed. Please return in 3 months for follow-up, refill on Clonazepam. Please  follow-up with Cardiologist ASAP.

## 2016-05-10 NOTE — Assessment & Plan Note (Signed)
Continue daily pantoprazole.  Avoid spicy/acidic foods. Do not eat close to bedtime.

## 2016-05-11 ENCOUNTER — Other Ambulatory Visit: Payer: Self-pay | Admitting: Nurse Practitioner

## 2016-05-11 DIAGNOSIS — R932 Abnormal findings on diagnostic imaging of liver and biliary tract: Secondary | ICD-10-CM

## 2016-05-11 DIAGNOSIS — K760 Fatty (change of) liver, not elsewhere classified: Secondary | ICD-10-CM

## 2016-06-03 ENCOUNTER — Other Ambulatory Visit: Payer: Commercial Managed Care - HMO

## 2016-06-28 ENCOUNTER — Emergency Department (HOSPITAL_COMMUNITY): Payer: Medicare HMO

## 2016-06-28 ENCOUNTER — Ambulatory Visit (INDEPENDENT_AMBULATORY_CARE_PROVIDER_SITE_OTHER): Payer: Medicare HMO | Admitting: Family Medicine

## 2016-06-28 ENCOUNTER — Encounter: Payer: Self-pay | Admitting: Family Medicine

## 2016-06-28 ENCOUNTER — Emergency Department (HOSPITAL_COMMUNITY)
Admission: EM | Admit: 2016-06-28 | Discharge: 2016-06-28 | Disposition: A | Discharge status: Home / Self Care | Payer: Medicare HMO | Attending: Emergency Medicine | Admitting: Emergency Medicine

## 2016-06-28 ENCOUNTER — Encounter (HOSPITAL_COMMUNITY): Payer: Self-pay

## 2016-06-28 ENCOUNTER — Other Ambulatory Visit: Payer: Self-pay | Admitting: Student

## 2016-06-28 VITALS — BP 124/81 | HR 85 | Ht 66.0 in | Wt 199.6 lb

## 2016-06-28 DIAGNOSIS — Z87891 Personal history of nicotine dependence: Secondary | ICD-10-CM | POA: Diagnosis not present

## 2016-06-28 DIAGNOSIS — J449 Chronic obstructive pulmonary disease, unspecified: Secondary | ICD-10-CM | POA: Insufficient documentation

## 2016-06-28 DIAGNOSIS — I200 Unstable angina: Secondary | ICD-10-CM | POA: Diagnosis not present

## 2016-06-28 DIAGNOSIS — Z79899 Other long term (current) drug therapy: Secondary | ICD-10-CM | POA: Insufficient documentation

## 2016-06-28 DIAGNOSIS — R0602 Shortness of breath: Secondary | ICD-10-CM | POA: Diagnosis not present

## 2016-06-28 DIAGNOSIS — E039 Hypothyroidism, unspecified: Secondary | ICD-10-CM | POA: Diagnosis not present

## 2016-06-28 DIAGNOSIS — I252 Old myocardial infarction: Secondary | ICD-10-CM | POA: Diagnosis not present

## 2016-06-28 DIAGNOSIS — I249 Acute ischemic heart disease, unspecified: Secondary | ICD-10-CM | POA: Insufficient documentation

## 2016-06-28 DIAGNOSIS — Z955 Presence of coronary angioplasty implant and graft: Secondary | ICD-10-CM | POA: Diagnosis not present

## 2016-06-28 DIAGNOSIS — R079 Chest pain, unspecified: Secondary | ICD-10-CM

## 2016-06-28 DIAGNOSIS — E785 Hyperlipidemia, unspecified: Secondary | ICD-10-CM

## 2016-06-28 DIAGNOSIS — R0789 Other chest pain: Secondary | ICD-10-CM

## 2016-06-28 DIAGNOSIS — R0609 Other forms of dyspnea: Secondary | ICD-10-CM

## 2016-06-28 DIAGNOSIS — I209 Angina pectoris, unspecified: Secondary | ICD-10-CM

## 2016-06-28 DIAGNOSIS — I10 Essential (primary) hypertension: Secondary | ICD-10-CM | POA: Insufficient documentation

## 2016-06-28 DIAGNOSIS — Z7982 Long term (current) use of aspirin: Secondary | ICD-10-CM | POA: Diagnosis not present

## 2016-06-28 LAB — CBC WITH DIFFERENTIAL/PLATELET
Basophils Absolute: 0 10*3/uL (ref 0.0–0.1)
Basophils Relative: 0 %
Eosinophils Absolute: 0.2 10*3/uL (ref 0.0–0.7)
Eosinophils Relative: 3 %
HEMATOCRIT: 44 % (ref 36.0–46.0)
HEMOGLOBIN: 14.6 g/dL (ref 12.0–15.0)
LYMPHS ABS: 2.5 10*3/uL (ref 0.7–4.0)
LYMPHS PCT: 32 %
MCH: 28.6 pg (ref 26.0–34.0)
MCHC: 33.2 g/dL (ref 30.0–36.0)
MCV: 86.1 fL (ref 78.0–100.0)
MONOS PCT: 6 %
Monocytes Absolute: 0.5 10*3/uL (ref 0.1–1.0)
NEUTROS ABS: 4.7 10*3/uL (ref 1.7–7.7)
Neutrophils Relative %: 59 %
Platelets: 263 10*3/uL (ref 150–400)
RBC: 5.11 MIL/uL (ref 3.87–5.11)
RDW: 13.5 % (ref 11.5–15.5)
WBC: 7.9 10*3/uL (ref 4.0–10.5)

## 2016-06-28 LAB — COMPREHENSIVE METABOLIC PANEL
ALBUMIN: 4.5 g/dL (ref 3.5–5.0)
ALK PHOS: 92 U/L (ref 38–126)
ALT: 12 U/L — ABNORMAL LOW (ref 14–54)
ANION GAP: 9 (ref 5–15)
AST: 22 U/L (ref 15–41)
BILIRUBIN TOTAL: 0.7 mg/dL (ref 0.3–1.2)
BUN: 13 mg/dL (ref 6–20)
CO2: 23 mmol/L (ref 22–32)
Calcium: 9.6 mg/dL (ref 8.9–10.3)
Chloride: 106 mmol/L (ref 101–111)
Creatinine, Ser: 0.77 mg/dL (ref 0.44–1.00)
GFR calc Af Amer: >60 mL/min (ref >60)
GLUCOSE: 84 mg/dL (ref 65–99)
Potassium: 4.3 mmol/L (ref 3.5–5.1)
Sodium: 138 mmol/L (ref 135–145)
Total Protein: 7.5 g/dL (ref 6.5–8.1)

## 2016-06-28 LAB — D-DIMER, QUANTITATIVE: D-Dimer, Quant: 0.75 ug/mL-FEU — ABNORMAL HIGH (ref 0.00–0.50)

## 2016-06-28 LAB — TROPONIN I: Troponin I: <0.03 ng/mL (ref <0.03)

## 2016-06-28 LAB — BRAIN NATRIURETIC PEPTIDE: B Natriuretic Peptide: 24.1 pg/mL (ref 0.0–100.0)

## 2016-06-28 MED ORDER — RANOLAZINE ER 500 MG PO TB12
500.0000 mg | ORAL_TABLET | Freq: Two times a day (BID) | ORAL | Status: DC
  Administered 2016-06-28: 500 mg via ORAL
  Filled 2016-06-28: qty 1

## 2016-06-28 MED ORDER — RANOLAZINE ER 500 MG PO TB12: 500.0000 mg | ORAL_TABLET | Freq: Two times a day (BID) | ORAL | 0 refills | Status: DC

## 2016-06-28 MED ORDER — IOPAMIDOL (ISOVUE-370) INJECTION 76%
INTRAVENOUS | Status: AC
  Administered 2016-06-28: 100 mL
  Filled 2016-06-28: qty 100

## 2016-06-28 NOTE — ED Provider Notes (Signed)
I assumed care of this patient from Dr. Dayna Barker at 1600.  Please see their note for further details of Hx, PE.  Briefly patient is a 61 y.o. female who presents with chest pain. Current plan is to follow-up with cardiology for their recommendations..  Cardiology evaluated the patient in the emergency department and felt that the patient would not benefit from an additional catheterization given her known disease. Medical management for patient's symptoms is limited given to her intolerance of certain medications. They would like to try patient on Ranexa. She will follow-up with cardiology.  The patient is safe for discharge with strict return precautions.   Disposition: Discharge  Condition: Good  I have discussed the results, Dx and Tx plan with the patient who expressed understanding and agree(s) with the plan. Discharge instructions discussed at great length. The patient was given strict return precautions who verbalized understanding of the instructions. No further questions at time of discharge.    Discharge Medication List as of 06/28/2016  6:13 PM    START taking these medications   Details  ranolazine (RANEXA) 500 MG 12 hr tablet Take 1 tablet (500 mg total) by mouth 2 (two) times daily., Starting Tue 06/28/2016, Until Thu 07/28/2016, Print        Follow Up: Lelon Perla, MD [address] [employer] [place] [address] [phone]  Follow up The office will call you to arrange follow-up. If you do not hear from them in 3 business days, please call the number provided.       Fatima Blank, MD 06/29/16 [phone]

## 2016-06-28 NOTE — Progress Notes (Signed)
Impression and Recommendations:    1. Other chest pain   2. ACS (acute coronary syndrome)      ACS (acute coronary syndrome) No acute EKG changes since 12/15/15 - exertional CP with major sx and unable to tol nitro/ other meds - to ED now via EMS for further urgent cardiac eval - care tx to EMS after discussion with family had.  All questions answered  The patient was counseled, risk factors were discussed, anticipatory guidance given.   New Prescriptions   No medications on file     Meds ordered this encounter  Medications  . Vitamin D, Cholecalciferol, 1000 units CAPS    Sig: Take 2,000 Units by mouth daily.     Discontinued Medications   HYDROCODONE-HOMATROPINE (HYCODAN) 5-1.5 MG/5ML SYRUP    Take 5 mLs by mouth every 8 (eight) hours as needed for cough.     Orders Placed This Encounter  Procedures  . EKG 12-Lead     Gross side effects, risk and benefits, and alternatives of medications and treatment plan in general discussed with patient.  Patient is aware that all medications have potential side effects and we are unable to predict every side effect or drug-drug interaction that may occur.   Patient will call with any questions prior to using medication if they have concerns.  Expresses verbal understanding and consents to current therapy and treatment regimen.  No barriers to understanding were identified.  Red flag symptoms and signs discussed in detail.  Patient expressed understanding regarding what to do in case of emergency\urgent symptoms  Please see AVS handed out to patient at the end of our visit for further patient instructions/ counseling done pertaining to today's office visit.   Return for f/up post hosp.     Note: This document was prepared using Dragon voice recognition software and may include unintentional dictation  errors.   --------------------------------------------------------------------------------------------------------------------------------------------------------------------------------------------------------------------------------------------    Subjective:    CC:  Chief Complaint  Patient presents with  . Fatigue  . Chest Pain    HPI: Amy Hodge is a 61 y.o. female who presents to [place] at [facility] today for issues as discussed below.  - new to me.    Dr Stanford Breed is her Cardiologist.      CC: Much more fatigue and SOB than usual.  Chest pain/ heaviness in chest with activity with accompanying SOB and even Nausea for 3-4 wks now has been very bad.   Today presents with having sx this am with exertion/ activity but no sx walking in today.  UNable to tol nitro or imdur due to terrible HA.  Also for past 1 month having PND and orthopnea with having 2 thick pillows to sleep on at night- this is new.    If lays flat gets chest pressure and too SOB- she can't breath.   N CP currently with sitting here in office.     She called her cardiologist and could not get her in for sev mo.      --> Med HX;  CAD s/p stenting mult times, OSA, HTN, COPD, HLD - uncontrolled as she is unable to tolerate statins.  No smoking for 20+ yrs now.  Takes plavix QD.    Sign Cardiac hx: stent in Nov 2014 in [place]- had the widow maker with over 90% blockage per pt.   Second procedure- Feb 2015- 3 drug eluding stents placed in [facility].  Moved here  couple yrs ago.  Then failed proceudre in 2017.     Notes below:  Reading physician: Jettie Booze, MD Ordering physician: Jettie Booze, MD Study date: 12/15/15  Physicians   Panel Physicians Referring Physician Case Authorizing Physician  Jettie Booze, MD (Primary)    Procedures   Left Heart Cath and Coronary Angiography  Conclusion     Ost RPDA lesion, prior stent 100 %stenosed. Left to  right collaterals fill this territory.  RCA stent patient with mild in stent restenosis.  The left ventricular systolic function is normal.  The left ventricular ejection fraction is 50-55% by visual estimate.  LV end diastolic pressure is normal.  There is no aortic valve stenosis.  Septal collateral arcade that feeds in to branch of PDA.   Continue medical therapy.  Will discuss case for possible CTO PCI if her sx are not controlled.  We discussed the additional risks of retrograde CTO PCI after the procedure.  She will take this in to consideration.  I also discussed this with the family.         Wt Readings from Last 3 Encounters:  06/28/16 199 lb 9.6 oz (90.5 kg)  05/10/16 198 lb 1.6 oz (89.9 kg)  04/28/16 198 lb 4.8 oz (89.9 kg)   BP Readings from Last 3 Encounters:  06/28/16 124/81  05/10/16 113/79  04/28/16 114/79   Pulse Readings from Last 3 Encounters:  06/28/16 85  05/10/16 85  04/28/16 80   BMI Readings from Last 3 Encounters:  06/28/16 32.22 kg/m  05/10/16 31.97 kg/m  04/28/16 32.01 kg/m     Patient Care Team    Relationship Specialty Notifications Start End  Odella Aquas, NP PCP - General Family Medicine  04/28/16   Lelon Perla, MD Consulting Physician Cardiology  05/10/16   Deneise Lever, MD Consulting Physician Pulmonary Disease  05/10/16   Roseanne Kaufman, MD Consulting Physician Orthopedic Surgery  05/10/16   Garlan Fair, MD Consulting Physician Gastroenterology  05/10/16      Patient Active Problem List   Diagnosis Date Noted  . Stented coronary artery 02/26/2015    Priority: High  . Coronary artery disease 04/08/2013    Priority: High  . Hyperlipidemia- intol to statins 06/14/2007    Priority: High  . COPD mixed type ([place]) 06/14/2007    Priority: High  . Obstructive sleep apnea 08/26/2015    Priority: Medium  . Centrilobular emphysema ([place]) 06/24/2014    Priority: Medium  . Chronic pain syndrome 02/20/2009    Priority: Low   . COMMON MIGRAINE 02/20/2009    Priority: Low  . Hypothyroidism 10/06/2006    Priority: Low  . Depression 10/06/2006    Priority: Low  . GERD 10/06/2006    Priority: Low  . ACS (acute coronary syndrome) 06/28/2016  . Insomnia secondary to anxiety 05/10/2016  . Cough 04/28/2016  . Lung nodule 08/26/2015  . Shingles rash 08/04/2015  . Allergic reaction 06/27/2015  . History of hemoptysis 06/27/2015  . Nocturnal hypoxemia 02/26/2015  . Hemoptysis 10/24/2014  . Soft tissue injury of multiple sites/bilateral hands 05/16/2013  . Bite from dog 05/15/2013  . Chest pain 05/15/2013  . COPD exacerbation ([place]) 12/31/2010  . Health care maintenance 12/25/2010  . MENOPAUSAL DISORDER 02/16/2010  . VITAMIN D DEFICIENCY 10/07/2009  . OSTEOARTHRITIS, KNEE, RIGHT 02/20/2009  . SKIN LESION 11/27/2008  . BACK PAIN 11/27/2008  . FATIGUE 07/15/2008  . Memory loss 07/15/2008  . PERIPHERAL EDEMA 07/15/2008  .  Sinusitis, acute frontal 03/13/2008  . KNEE PAIN, BILATERAL 09/05/2007  . Seasonal and perennial allergic rhinitis 06/14/2007  . Anxiety state 10/06/2006  . Headache(784.0) 10/06/2006    Past Medical history, Surgical history, Family history, Social history, Allergies and Medications have been entered into the medical record, reviewed and changed as needed.    Current Meds  Medication Sig  . acetaminophen (TYLENOL) 500 MG tablet Take 1,000 mg by mouth every 8 (eight) hours as needed for mild pain.   Marland Kitchen albuterol (PROVENTIL HFA;VENTOLIN HFA) 108 (90 BASE) MCG/ACT inhaler Inhale 2 puffs into the lungs every 6 (six) hours as needed for wheezing or shortness of breath.  Marland Kitchen amLODipine (NORVASC) 2.5 MG tablet Take 1 tablet (2.5 mg total) by mouth daily.  Marland Kitchen aspirin EC 81 MG tablet Take 81 mg by mouth daily.  Marland Kitchen buPROPion (WELLBUTRIN XL) 300 MG 24 hr tablet Take 1 tablet (300 mg total) by mouth daily.  . clonazePAM (KLONOPIN) 0.5 MG tablet Take 1 tablet (0.5 mg total) by mouth at bedtime.  Derrill Memo ON 07/10/2016] clonazePAM (KLONOPIN) 0.5 MG tablet Take 1 tablet (0.5 mg total) by mouth at bedtime.  . clopidogrel (PLAVIX) 75 MG tablet Take 1 tablet (75 mg total) by mouth daily with breakfast. NEED OV.  Marland Kitchen ibuprofen (ADVIL,MOTRIN) 200 MG tablet Take 800 mg by mouth every 6 (six) hours as needed for headache or moderate pain.  Marland Kitchen levothyroxine (SYNTHROID) 175 MCG tablet Take 1 tablet (175 mcg total) by mouth daily.  . nitroGLYCERIN (NITROSTAT) 0.4 MG SL tablet Place 0.4 mg under the tongue every 5 (five) minutes as needed for chest pain.  . pantoprazole (PROTONIX) 40 MG tablet Take 1 tablet (40 mg total) by mouth daily.  Marland Kitchen spironolactone (ALDACTONE) 25 MG tablet Take 1 tablet (25 mg total) by mouth daily.  . Vitamin D, Cholecalciferol, 1000 units CAPS Take 2,000 Units by mouth daily.  . [DISCONTINUED] HYDROcodone-homatropine (HYCODAN) 5-1.5 MG/5ML syrup Take 5 mLs by mouth every 8 (eight) hours as needed for cough.    Allergies:  Allergies  Allergen Reactions  . Bactrim [Sulfamethoxazole-Trimethoprim] Shortness Of Breath, Anxiety and Palpitations  . Valsartan Cough    headache  . Bystolic [Nebivolol Hcl] Other (See Comments)    Fatigue- Tired feeling  . Isosorbide Other (See Comments)    HEADACHES   . Levofloxacin Hives  . Lisinopril Cough  . Metoprolol Other (See Comments)    FATIGUE  . Morphine And Related Other (See Comments)    headache  . Simvastatin Other (See Comments)    Makes my legs hurt/ sore   . Statins Other (See Comments)    MYALGIAS  . Tetracycline Hives  . Toradol [Ketorolac Tromethamine] Palpitations     Review of Systems: General:   Denies fever, chills, unexplained weight loss.  Optho/Auditory:   Denies visual changes, blurred vision/LOV Respiratory:   Denies wheeze, DOE more than baseline levels.  Cardiovascular:   Denies chest pain, palpitations, new onset peripheral edema  Gastrointestinal:   Denies nausea, vomiting, diarrhea, abd pain.   Genitourinary: Denies dysuria, freq/ urgency, flank pain or discharge from genitals.  Endocrine:     Denies hot or cold intolerance, polyuria, polydipsia. Musculoskeletal:   Denies unexplained myalgias, joint swelling, unexplained arthralgias, gait problems.  Skin:  Denies new onset rash, suspicious lesions Neurological:     Denies dizziness, unexplained weakness, numbness  Psychiatric/Behavioral:   Denies mood changes, suicidal or homicidal ideations, hallucinations       Objective:  Blood pressure 124/81, pulse 85, height 5\' 6"  (1.676 m), weight 199 lb 9.6 oz (90.5 kg), SpO2 97 %. Body mass index is 32.22 kg/m. General:  Well Developed, well nourished, appropriate for stated age.  Neuro:  Alert and oriented,  extra-ocular muscles intact  HEENT:  Normocephalic, atraumatic, neck supple, no carotid bruits appreciated  Skin:  no gross rash, warm, pink. Cardiac:  RRR, S1 S2 Respiratory:  ECTA B/L and A/P, Not using accessory muscles, speaking in full sentences- unlabored. Vascular:  Ext warm, no cyanosis apprec.; cap RF less 2 sec. Psych:  No HI/SI, judgement and insight good, Euthymic mood. Full Affect.  No results found for this or any previous visit (from the past 2160 hour(s)).

## 2016-06-28 NOTE — ED Notes (Signed)
Patient transported to X-ray 

## 2016-06-28 NOTE — Assessment & Plan Note (Signed)
No acute EKG changes since 12/15/15 - exertional CP with major sx and unable to tol nitro/ other meds - to ED now via EMS for further urgent cardiac eval

## 2016-06-28 NOTE — Consult Note (Signed)
Cardiology Consult    Patient ID: [name] MRN: [id number], DOB/AGE: 07/05/1955   Consult date: 06/28/2016  Primary Physician: Lillard Anes, NP Primary Cardiologist: Dr. Stanford Breed Referring Provider: Dr. Dayna Barker  History of Present Illness    Amy Hodge is a 61 y.o. female with past medical history of CAD (s/p PCI of RCA in 2015, cath in 12/2015 showed 100% Ost RPDA with collaterals present), HTN, HLD, GERD, COPD, and hypothyroidism who presents to [facility] ED on 06/28/2016 for evaluation of worsening chest pain and dyspnea on exertion. She is being seen for evaluation of chest pain at the request Dr. Dayna Barker.   She was last seen by Dr. Stanford Breed in 12/2015 and reported episodes of intermittent chest pain for the past several months, occurring at both rest and with exertion. A cardiac catheterization was recommended and this was obtained on 12/15/2015, showing known 100% stenosis of Ost RPDA lesion with left to right collaterals, RCA stent with mild ISR, and a preserved EF of 50-55%. Continued medical management was recommended with possible CTO PCI if her symptoms were not controlled with medical management.  Examined by her PCP earlier this morning for chest pain and was brought to [facility] ED via EMS. Says she called the Cardiology office and the first available appointment was in June as she refused to see an APP. She reports having worsening chest pain and dyspnea on exertion since her cath last year. She describes the pain as something sitting on her chest which usually presents with exertion and resolves with rest. Radiates into her left jaw. She reports associated dyspnea and diaphoresis. No nausea, vomiting, orthopnea, or PND.  She reports being intolerant to multiple cardiac medications including "all" beta-blockers, "all" ACE-I, and "all" statins. She does not take SL NTG due to significant headaches with this. Also intolerant to Imdur.   Initial labs show WBC of 7.9, Hgb  14.6, and platelets 263. Na+ 138, K+ 4.3, creatinine 0.77. Initial troponin negative. BNP 24.1. D-dimer 0.75. CXR showing mild atelectasis with no edema or consolidation. CTA with no evidence of PE with stable dilation of the ascending aorta up to 4.1cm. EKG shows NSR, HR 88, with minimal ST depression in Leads I and II.    Past Medical History    Past Medical History:  Diagnosis Date  . Allergy   . Anxiety   . COPD (chronic obstructive pulmonary disease) ([place])   . Coronary artery disease    a. s/p PCI of RCA in 2015 b. cath in 12/2015 showed 100% Ost RPDA with collaterals present  . DDD (degenerative disc disease), cervical   . Depression   . DJD (degenerative joint disease) of knee    RT  . GERD (gastroesophageal reflux disease)   . Hashimoto's disease   . Headache(784.0)   . Hyperlipidemia   . Hypertension   . Hypothyroidism   . Myocardial infarction ([place])   . Nocturnal hypoxemia 02/26/2015  . On supplemental oxygen therapy    Oxygen 2.5 l/m at bedtime.  . Stented coronary artery 02/26/2015    Past Surgical History:  Procedure Laterality Date  . ABDOMINAL HYSTERECTOMY    . APPENDECTOMY  1971  . BUNIONECTOMY Right   . CARDIAC CATHETERIZATION N/A 09/02/2014   Procedure: Left Heart Cath and Coronary Angiography;  Surgeon: Adrian Prows, MD;  Location: [place] CV [facility];  Service: Cardiovascular;  Laterality: N/A;  . CARDIAC CATHETERIZATION N/A 12/15/2015   Procedure: Left Heart Cath and Coronary Angiography;  Surgeon: Jettie Booze, MD;  Location: [place] CV LAB;  Service: Cardiovascular;  Laterality: N/A;  . CERVICAL FUSION    . CESAREAN SECTION    . COLONOSCOPY WITH PROPOFOL N/A 02/24/2015   Procedure: COLONOSCOPY WITH PROPOFOL;  Surgeon: Garlan Fair, MD;  Location: [facility] END[facility]COPY;  Service: Endoscopy;  Laterality: N/A;  . ESOPHAGOGASTRODUODENOSCOPY (EGD) WITH PROPOFOL N/A 02/24/2015   Procedure: ESOPHAGOGASTRODUODENOSCOPY (EGD) WITH PROPOFOL;  Surgeon: Garlan Fair, MD;  Location: [facility] ENDOSCOPY;  Service: Endoscopy;  Laterality: N/A;  . HAND SURGERY     due to trauma-dog bite.  . I&D EXTREMITY Bilateral 05/17/2013   Procedure: IRRIGATION AND DEBRIDEMENT EXTREMITY;  Surgeon: Roseanne Kaufman, MD;  Location: [place];  Service: Orthopedics;  Laterality: Bilateral;  . LEFT HEART CATHETERIZATION WITH CORONARY ANGIOGRAM N/A 03/04/2014   Procedure: LEFT HEART CATHETERIZATION WITH CORONARY ANGIOGRAM;  Surgeon: Laverda Page, MD;  Location: [facility] CATH LAB;  Service: Cardiovascular;  Laterality: N/A;  . OOPHORECTOMY    . SHOULDER SURGERY     RT  . THYROIDECTOMY  1996  . TONSILLECTOMY  1965  . TOTAL ABDOMINAL HYSTERECTOMY W/ BILATERAL SALPINGOOPHORECTOMY  01/2008   with cervical dysplasia  . TUBAL LIGATION  1984     Allergies  Allergies  Allergen Reactions  . Bactrim [Sulfamethoxazole-Trimethoprim] Shortness Of Breath, Anxiety and Palpitations  . Valsartan Other (See Comments) and Cough    Headaches also   . Bystolic [Nebivolol Hcl] Other (See Comments)    Fatigue- Tired feeling  . Isosorbide Other (See Comments)    HEADACHES   . Levofloxacin Hives  . Lisinopril Cough  . Metoprolol Other (See Comments)    FATIGUE  . Morphine And Related Other (See Comments)    headache  . Simvastatin Other (See Comments)    Makes legs hurt/ sore   . Statins Other (See Comments)    MYALGIAS  . Tetracycline Hives  . Toradol [Ketorolac Tromethamine] Palpitations     Home Medications    Prior to Admission medications   Medication Sig Start Date End Date Taking? Authorizing Provider  acetaminophen (TYLENOL) 500 MG tablet Take 1,000 mg by mouth every 8 (eight) hours as needed (for pain or headahces).    Yes Historical Provider, MD  albuterol (PROVENTIL HFA;VENTOLIN HFA) 108 (90 BASE) MCG/ACT inhaler Inhale 2 puffs into the lungs every 6 (six) hours as needed for wheezing or shortness of breath.   Yes Historical Provider, MD  amLODipine (NORVASC) 2.5 MG  tablet Take 1 tablet (2.5 mg total) by mouth daily. 05/10/16  Yes Odella Aquas, NP  aspirin EC 81 MG tablet Take 81 mg by mouth daily.   Yes Historical Provider, MD  buPROPion (WELLBUTRIN XL) 300 MG 24 hr tablet Take 1 tablet (300 mg total) by mouth daily. 01/20/16  Yes Biagio Borg, MD  cetirizine (ZYRTEC) 10 MG tablet Take 10 mg by mouth daily.   Yes Historical Provider, MD  clonazePAM (KLONOPIN) 0.5 MG tablet Take 1 tablet (0.5 mg total) by mouth at bedtime. 07/10/16 08/10/16 Yes Odella Aquas, NP  clopidogrel (PLAVIX) 75 MG tablet Take 1 tablet (75 mg total) by mouth daily with breakfast. NEED OV. 05/10/16  Yes Odella Aquas, NP  ibuprofen (ADVIL,MOTRIN) 200 MG tablet Take 800 mg by mouth every 6 (six) hours as needed for headache or moderate pain.   Yes Historical Provider, MD  levothyroxine (SYNTHROID) 175 MCG tablet Take 1 tablet (175 mcg total) by mouth daily. 09/09/15  Yes Jeneen Rinks  Quin Hoop, MD  nitroGLYCERIN (NITROSTAT) 0.4 MG SL tablet Place 0.4 mg under the tongue every 5 (five) minutes as needed for chest pain.   Yes Historical Provider, MD  pantoprazole (PROTONIX) 40 MG tablet Take 1 tablet (40 mg total) by mouth daily. 05/10/16  Yes Odella Aquas, NP  spironolactone (ALDACTONE) 25 MG tablet Take 1 tablet (25 mg total) by mouth daily. 05/10/16  Yes Odella Aquas, NP  Vitamin D, Cholecalciferol, 1000 units CAPS Take 2,000 Units by mouth daily.   Yes Historical Provider, MD  clonazePAM (KLONOPIN) 0.5 MG tablet Take 1 tablet (0.5 mg total) by mouth at bedtime. Patient not taking: Reported on 06/28/2016 06/10/16 07/10/16  Odella Aquas, NP    Family History    Family History  Problem Relation Age of Onset  . Lung cancer Mother   . Hypertension Mother   . Stroke Mother   . Diabetes Mother   . Diabetes Father   . Hypertension Father   . Heart attack Father   . Asthma Father   . COPD Father   . Alcohol abuse Father   . Diabetes Sister   . Hypertension Sister     Social History    Social History   Social  History  . Marital status: Married    Spouse name: N/A  . Number of children: 1  . Years of education: N/A   Occupational History  . disabled Disability   Social History Main Topics  . Smoking status: Former Smoker    Packs/day: 2.00    Years: 25.00    Types: Cigarettes    Quit date: 04/23/1996  . Smokeless tobacco: Never Used  . Alcohol use No  . Drug use: No  . Sexual activity: Not Currently   Other Topics Concern  . Not on file   Social History Narrative   On worker's comp for lower back and right shoulder - applying for disability; losing [employer] insurance 03/07/2010     Review of Systems    General:  No chills, fever, night sweats or weight changes.  Cardiovascular:  No chest pain, dyspnea on exertion, edema, orthopnea, palpitations, paroxysmal nocturnal dyspnea. Positive for chest pain and dyspnea on exertion.  Dermatological: No rash, lesions/masses Respiratory: No cough, Positive for dyspnea Urologic: No hematuria, dysuria Abdominal:   No nausea, vomiting, diarrhea, bright red blood per rectum, melena, or hematemesis Neurologic:  No visual changes, wkns, changes in mental status. All other systems reviewed and are otherwise negative except as noted above.  Physical Exam    Blood pressure 125/90, pulse 92, temperature 98.2 F (36.8 C), temperature source Oral, resp. rate 20, height 5\' 6"  (1.676 m), weight 199 lb (90.3 kg), SpO2 94 %.  General: Well developed, well nourished Caucasian female appearing in no acute distress. Head: Normocephalic, atraumatic, sclera non-icteric, no xanthomas, nares are without discharge. Dentition:  Neck: No carotid bruits. JVD not elevated.  Lungs: Respirations regular and unlabored, without wheezes or rales.  Heart: Regular rate and rhythm. No S3 or S4.  No murmur, no rubs, or gallops appreciated. Abdomen: Soft, non-tender, non-distended with normoactive bowel sounds. No hepatomegaly. No rebound/guarding. No obvious abdominal  masses. Msk:  Strength and tone appear normal for age. No joint deformities or effusions. Extremities: No clubbing or cyanosis. No edema.  Distal pedal pulses are 2+ bilaterally. Neuro: Alert and oriented X 3. Moves all extremities spontaneously. No focal deficits noted. Psych:  Responds to questions appropriately with a normal affect. Skin: No rashes  or lesions noted  Labs    Troponin (Point of Care Test) No results for input(s): TROPIPOC in the last 72 hours.  Recent Labs  06/28/16 1201  TROPONINI <0.03   Lab Results  Component Value Date   WBC 7.9 06/28/2016   HGB 14.6 06/28/2016   HCT 44.0 06/28/2016   MCV 86.1 06/28/2016   PLT 263 06/28/2016     Recent Labs Lab 06/28/16 1201  NA 138  K 4.3  CL 106  CO2 23  BUN 13  CREATININE 0.77  CALCIUM 9.6  PROT 7.5  BILITOT 0.7  ALKPHOS 92  ALT 12*  AST 22  GLUCOSE 84   Lab Results  Component Value Date   CHOL 230 (H) 12/11/2015   HDL 40 (L) 12/11/2015   LDLCALC 142 (H) 12/11/2015   TRIG 240 (H) 12/11/2015   Lab Results  Component Value Date   DDIMER 0.75 (H) 06/28/2016     B Natriuretic Peptide  Date/Time Value Ref Range Status  06/28/2016 12:01 PM 24.1 0.0 - 100.0 pg/mL Final   No results found for: PROBNP No results for input(s): INR in the last 72 hours.    Radiology Studies    Dg Chest 2 View  Result Date: 06/28/2016 CLINICAL DATA:  Chest pain EXAM: CHEST  2 VIEW COMPARISON:  Chest radiograph October 23, 2014 and chest CT March 04, 2016 FINDINGS: There is slight atelectasis in both mid and lower lung zones. There is no edema or consolidation. The heart size and pulmonary vascularity are normal. There is atherosclerotic calcification in the aorta. No adenopathy. There is postoperative change consistent with prior thyroidectomy as well as in the lower cervical spine region. IMPRESSION: Areas of mild atelectatic change bilaterally. No edema or consolidation. Aortic atherosclerosis. No adenopathy.  Electronically Signed   By: Lowella Grip III M.D.   On: 06/28/2016 12:53   Ct Angio Chest Pe W And/or Wo Contrast  Result Date: 06/28/2016 CLINICAL DATA:  Shortness of breath for 1 month with progression. EXAM: CT ANGIOGRAPHY CHEST WITH CONTRAST TECHNIQUE: Multidetector CT imaging of the chest was performed using the standard protocol during bolus administration of intravenous contrast. Multiplanar CT image reconstructions and MIPs were obtained to evaluate the vascular anatomy. CONTRAST:  100 mL Isovue 370 COMPARISON:  Two-view chest x-ray 06/28/2016. CTA of the chest 03/04/2016. FINDINGS: Cardiovascular: The heart size is normal. Dilation of the ascending aorta is stable. Pulmonary arterial opacification is excellent. There is some patient breathing motion no obscuring distal segmental arteries in the lower lobes bilaterally. No proximal filling defects are present to suggest pulmonary emboli. Upper lobar arteries are clear bilaterally. Coronary artery calcifications are present. Mediastinum/Nodes: No significant mediastinal or axillary adenopathy is present. Lungs/Pleura: There is mild dependent atelectasis at the lung bases bilaterally, worse on the right. Mild patient breathing motion is noted. Upper Abdomen: Within normal limits. Musculoskeletal: Vertebral body heights and alignment are maintained. No focal lytic or blastic lesions are present. The ribs are unremarkable. Review of the MIP images confirms the above findings. IMPRESSION: 1. No pulmonary embolus. 2. Mild patient breathing motion decreases sensitivity for distal pulmonary emboli. 3. Coronary artery disease. 4. Stable dilation of the ascending aorta measuring up to 4.1 cm. 5. Mild dependent atelectasis bilaterally is worse on the right. Electronically Signed   By: San Morelle M.D.   On: 06/28/2016 14:48    EKG & Cardiac Imaging    EKG:  NSR, HR 88, with minimal ST depression in Leads I and  II.- Personally Reviewed  Cardiac  Catheterization: 12/15/2015  Ost RPDA lesion, prior stent 100 %stenosed. Left to right collaterals fill this territory.  RCA stent patient with mild in stent restenosis.  The left ventricular systolic function is normal.  The left ventricular ejection fraction is 50-55% by visual estimate.  LV end diastolic pressure is normal.  There is no aortic valve stenosis.  Septal collateral arcade that feeds in to branch of PDA.   Continue medical therapy.  Will discuss case for possible CTO PCI if her sx are not controlled.  We discussed the additional risks of retrograde CTO PCI after the procedure.  She will take this in to consideration.  I also discussed this with the family.    Assessment & Plan    1. Unstable Angina/ CAD - has known CAD, s/p PCI of RCA in 2015, cath in 12/2015 showed 100% Ost RPDA with collaterals present.  - cardiac cath in 12/2015 showed known 100% stenosis of Ost RPDA lesion with left to right collaterals, RCA stent with mild ISR, and a preserved EF of 50-55%. Continued medical management was recommended with possible CTO PCI if her symptoms were not controlled with medical management. - reports worsening anginal symptoms with chest pain and dyspnea on exertion with minimal activity such as walking to the mailbox. Radiates into her left jaw. She reports associated dyspnea and diaphoresis. No nausea, vomiting, orthopnea, or PND. - EKG is without acute ischemic changes and initial troponin is negative.  - unsure of how beneficial a repeat cardiac catheterization would be as she has undergone two procedures in the past 2 years showing the known RDPA CTO. Medical therapy is limited as she reports intolerances to BB, ACE-I, statins, Zetia, and Imdur. Could consider the addition of Ranexa. Will discuss further ischemic evaluation with Dr. Martinique.   2. HTN - BP at 124/81 - 149/99 since arrival to the ED.  - continue Amlodipine 2.5mg  daily and Spironolactone 25mg  daily.   3.  HLD - Lipid Panel in 12/2015 showed total cholesterol 230, triglycerides 240, and LDL 142. Goal LDL is < 70 with known CAD. - intolerant to statin therapy and Zetia. Discussed PCSK9-inhibitors.   4. GERD - continue Protonix.    Arna Medici, PA-C 06/28/2016, 4:40 PM Pager: [phone] Patient seen and examined and history reviewed. Agree with above findings and plan. 61 yo WF with history of CAD seen for evaluation of chest pain. She reports that she has increased chest pain over the past month. She describes this as a pressure in her mid chest radiating to her left jaw with drawing. This is associated with SOB. She tells me that this has been worse over past month but told our PA that it has been worse ever since October. Also, sometimes she states she can be quite active but at other times can't do much of anything without symptoms. She does not take sl Ntg due to severe HA. Intolerant of multiple beta blockers, nitrates, statins, Zetia, ACEi.   She reports 3 prior heart attacks. She had initial stent of the RCA at one hospital in [facility] in 2015 with a BMS. She returned with MI to [facility] center with an MI and had complex bifurcation stenting of the RCA/PDA/PLOM bifurcation with 3 stents. This procedure took 3-4 hours and patient reports she had an infarct on the table. According to the patient the artery was patent at the end of procedure. The patient was seen here in 2016  by Dr. Einar Gip with recurrent chest pain and underwent cardiac cath by Dr. Einar Gip with findings of CTO of the ostium of the PDA. There were some left to right collaterals. She continued to have chest pain and attempt at opening the PDA was unsuccessful due to inability to pass a wire. Another cath in October 2017 showed stable anatomy with CTO of the PDA. No other significant stenosis. Some consideration was given for CTO PCI but this was felt to be unfavorable. Family reports they were told the only  fix was bypass surgery. It is interesting to note that her pain at that time was similar to now.   On exam she is a pleasant WF in NAD. VSS Lungs are clear. CV RRR without gallop, murmur or click. No JVD. Abd soft NT No edema. Pulses 2+  Ecg shows no acute change. Troponin is negative.  Impression: 1. Acute on chronic chest pain. No objective evidence of ischemia. She has known CTO of the PDA with otherwise nonobstructive CAD. While this may be the cause of her chest pain I think it is difficult to reconcile this with significant fluctuation in her symptoms over the past 2 years. She unfortunately has been intolerant of multiple antianginal medications including beta blockers and nitrates. She is taking amlodpine. She has not tried Ranexa in the past. Evaluation for noncardiac causes of chest pain has been largely unremarkable. She had chest CT showing mild 4 cm aortic enlargement. She has hepatic steatosis. No GB disease. Upper EGD negative.   I reviewed her prior angiograms in detail with focus on revascularization options. I do not think she is a candidate for CTO PCI. The ostium of the PDA is covered by at least 2 layers of stent- possibly more. The PDA is small and bifurcates in the mid vessel. The length of occlusion appears > 20 mm. I think even if it could be open it would not remain open due to small size. I also would not recommend CABG. This is her only stenosis and the vessel is too small a target for bypass.   I explained these findings in detail with the patient and family. Given the fact that she has had 3 cardiac caths in last 2 years for similar symptoms I would like to avoid repeat invasive evaluation unless she has clear objective evidence of ischemia. I think it would be helpful to schedule her for a nuclear stress test to assess the extent of ischemia. I would like to try Ranexa to see if her chest pain improves. The major draw back here may be cost but it is likely the most  effective therapy for her. Will check and see if we have samples in our office and will need to check with her medication plan to see what the cost would be for her. Perhaps she may be eligible for patient assistance. I spent over 60 minutes discussing this with patient and family. Will DC home from ED tonight and follow up in office.  Peter Martinique, [place] 06/28/2016 5:43 PM

## 2016-06-28 NOTE — Patient Instructions (Signed)
To ed via EMS for eval ACS

## 2016-06-28 NOTE — ED Notes (Signed)
Patient transported to CT 

## 2016-06-28 NOTE — ED Notes (Signed)
Pt ambulated to restroom, no issues. 

## 2016-06-28 NOTE — ED Provider Notes (Signed)
[facility] DEPT Provider Note   CSN: [id number] Arrival date & time: 06/28/16  1048     History   Chief Complaint Chief Complaint  Patient presents with  . Chest Pain    HPI Amy Hodge is a 61 y.o. female.  HPI  61 year old female with extensive coronary artery disease status post multiple stents here with worsening exertional chest pain and dyspnea. Patient states started last month and has gotten to the point where she is barely walks up the steps she gets to have a left-sided chest pressure radiating to her left jaw also with breath, diaphoresis and nausea. No syncope or vomiting. She states she tried to see her cardiologist however they are point result July should see her primary doctor today who sent her here for further evaluation. This time she is chest pain-free. She also endorses some intermittent right calf pain and swelling. Otherwise no modifying or associated symptoms.  Past Medical History:  Diagnosis Date  . Allergy   . Anxiety   . COPD (chronic obstructive pulmonary disease) ([place])   . Coronary artery disease    Stents x4 -past yr ago- Dr. Einar Gip  . DDD (degenerative disc disease), cervical   . Depression   . DJD (degenerative joint disease) of knee    RT  . GERD (gastroesophageal reflux disease)   . Hashimoto's disease   . Headache(784.0)   . Hyperlipidemia   . Hypertension   . Hypothyroidism   . Myocardial infarction ([place])   . Nocturnal hypoxemia 02/26/2015  . On supplemental oxygen therapy    Oxygen 2.5 l/m at bedtime.  . Stented coronary artery 02/26/2015    Patient Active Problem List   Diagnosis Date Noted  . ACS (acute coronary syndrome) 06/28/2016  . Insomnia secondary to anxiety 05/10/2016  . Cough 04/28/2016  . Obstructive sleep apnea 08/26/2015  . Lung nodule 08/26/2015  . Shingles rash 08/04/2015  . Allergic reaction 06/27/2015  . History of hemoptysis 06/27/2015  . Stented coronary artery 02/26/2015  . Nocturnal hypoxemia  02/26/2015  . Hemoptysis 10/24/2014  . Centrilobular emphysema ([place]) 06/24/2014  . Soft tissue injury of multiple sites/bilateral hands 05/16/2013  . Bite from dog 05/15/2013  . Chest pain 05/15/2013  . Coronary artery disease 04/08/2013  . COPD exacerbation ([place]) 12/31/2010  . Health care maintenance 12/25/2010  . MENOPAUSAL DISORDER 02/16/2010  . VITAMIN D DEFICIENCY 10/07/2009  . Chronic pain syndrome 02/20/2009  . COMMON MIGRAINE 02/20/2009  . OSTEOARTHRITIS, KNEE, RIGHT 02/20/2009  . SKIN LESION 11/27/2008  . BACK PAIN 11/27/2008  . FATIGUE 07/15/2008  . Memory loss 07/15/2008  . PERIPHERAL EDEMA 07/15/2008  . Sinusitis, acute frontal 03/13/2008  . KNEE PAIN, BILATERAL 09/05/2007  . Hyperlipidemia- intol to statins 06/14/2007  . Seasonal and perennial allergic rhinitis 06/14/2007  . COPD mixed type ([place]) 06/14/2007  . Hypothyroidism 10/06/2006  . Anxiety state 10/06/2006  . Depression 10/06/2006  . GERD 10/06/2006  . Headache(784.0) 10/06/2006    Past Surgical History:  Procedure Laterality Date  . ABDOMINAL HYSTERECTOMY    . APPENDECTOMY  1971  . BUNIONECTOMY Right   . CARDIAC CATHETERIZATION N/A 09/02/2014   Procedure: Left Heart Cath and Coronary Angiography;  Surgeon: Adrian Prows, MD;  Location: [facility];  Service: Cardiovascular;  Laterality: N/A;  . CARDIAC CATHETERIZATION N/A 12/15/2015   Procedure: Left Heart Cath and Coronary Angiography;  Surgeon: Jettie Booze, MD;  Location: [facility];  Service: Cardiovascular;  Laterality: N/A;  . CERVICAL  FUSION    . CESAREAN SECTION    . COLONOSCOPY WITH PROPOFOL N/A 02/24/2015   Procedure: COLONOSCOPY WITH PROPOFOL;  Surgeon: Garlan Fair, MD;  Location: [facility];  Service: Endoscopy;  Laterality: N/A;  . ESOPHAGOGASTRODUODENOSCOPY (EGD) WITH PROPOFOL N/A 02/24/2015   Procedure: ESOPHAGOGASTRODUODENOSCOPY (EGD) WITH PROPOFOL;  Surgeon: Garlan Fair, MD;  Location: [facility];   Service: Endoscopy;  Laterality: N/A;  . HAND SURGERY     due to trauma-dog bite.  . I&D EXTREMITY Bilateral 05/17/2013   Procedure: IRRIGATION AND DEBRIDEMENT EXTREMITY;  Surgeon: Roseanne Kaufman, MD;  Location: [facility];  Service: Orthopedics;  Laterality: Bilateral;  . LEFT HEART CATHETERIZATION WITH CORONARY ANGIOGRAM N/A 03/04/2014   Procedure: LEFT HEART CATHETERIZATION WITH CORONARY ANGIOGRAM;  Surgeon: Laverda Page, MD;  Location: [facility] C[facility] LAB;  Service: Cardiovascular;  Laterality: N/A;  . OOPHORECTOMY    . SHOULDER SURGERY     RT  . THYROIDECTOMY  1996  . TONSILLECTOMY  1965  . TOTAL ABDOMINAL HYSTERECTOMY W/ BILATERAL SALPINGOOPHORECTOMY  01/2008   with cervical dysplasia  . TUBAL LIGATION  1984    OB History    No data available       Home Medications    Prior to Admission medications   Medication Sig Start Date End Date Taking? Authorizing Provider  acetaminophen (TYLENOL) 500 MG tablet Take 1,000 mg by mouth every 8 (eight) hours as needed (for pain or headahces).    Yes Historical Provider, MD  albuterol (PROVENTIL HFA;VENTOLIN HFA) 108 (90 BASE) MCG/ACT inhaler Inhale 2 puffs into the lungs every 6 (six) hours as needed for wheezing or shortness of breath.   Yes Historical Provider, MD  amLODipine (NORVASC) 2.5 MG tablet Take 1 tablet (2.5 mg total) by mouth daily. 05/10/16  Yes Odella Aquas, NP  aspirin EC 81 MG tablet Take 81 mg by mouth daily.   Yes Historical Provider, MD  buPROPion (WELLBUTRIN XL) 300 MG 24 hr tablet Take 1 tablet (300 mg total) by mouth daily. 01/20/16  Yes Biagio Borg, MD  cetirizine (ZYRTEC) 10 MG tablet Take 10 mg by mouth daily.   Yes Historical Provider, MD  clonazePAM (KLONOPIN) 0.5 MG tablet Take 1 tablet (0.5 mg total) by mouth at bedtime. 07/10/16 08/10/16 Yes Odella Aquas, NP  clopidogrel (PLAVIX) 75 MG tablet Take 1 tablet (75 mg total) by mouth daily with breakfast. NEED OV. 05/10/16  Yes Odella Aquas, NP  ibuprofen (ADVIL,MOTRIN) 200 MG  tablet Take 800 mg by mouth every 6 (six) hours as needed for headache or moderate pain.   Yes Historical Provider, MD  levothyroxine (SYNTHROID) 175 MCG tablet Take 1 tablet (175 mcg total) by mouth daily. 09/09/15  Yes Biagio Borg, MD  nitroGLYCERIN (NITROSTAT) 0.4 MG SL tablet Place 0.4 mg under the tongue every 5 (five) minutes as needed for chest pain.   Yes Historical Provider, MD  pantoprazole (PROTONIX) 40 MG tablet Take 1 tablet (40 mg total) by mouth daily. 05/10/16  Yes Odella Aquas, NP  spironolactone (ALDACTONE) 25 MG tablet Take 1 tablet (25 mg total) by mouth daily. 05/10/16  Yes Odella Aquas, NP  Vitamin D, Cholecalciferol, 1000 units CAPS Take 2,000 Units by mouth daily.   Yes Historical Provider, MD  clonazePAM (KLONOPIN) 0.5 MG tablet Take 1 tablet (0.5 mg total) by mouth at bedtime. Patient not taking: Reported on 06/28/2016 06/10/16 07/10/16  Odella Aquas, NP    Family History Family History  Problem Relation Age of Onset  . Lung cancer Mother   . Hypertension Mother   . Stroke Mother   . Diabetes Mother   . Diabetes Father   . Hypertension Father   . Heart attack Father   . Asthma Father   . COPD Father   . Alcohol abuse Father   . Diabetes Sister   . Hypertension Sister     Social History Social History  Substance Use Topics  . Smoking status: Former Smoker    Packs/day: 2.00    Years: 25.00    Types: Cigarettes    Quit date: 04/23/1996  . Smokeless tobacco: Never Used  . Alcohol use No     Allergies   Bactrim [sulfamethoxazole-trimethoprim]; Valsartan; Bystolic [nebivolol hcl]; Isosorbide; Levofloxacin; Lisinopril; Metoprolol; Morphine and related; Simvastatin; Statins; Tetracycline; and Toradol [ketorolac tromethamine]   Review of Systems Review of Systems  All other systems reviewed and are negative.    Physical Exam Updated Vital Signs BP 125/90   Pulse 92   Temp 98.2 F (36.8 C) (Oral)   Resp 20   Ht 5\' 6"  (1.676 m)   Wt 199 lb (90.3 kg)   SpO2  94%   BMI 32.12 kg/m   Physical Exam  Constitutional: She is oriented to person, place, and time. She appears well-developed and well-nourished.  HENT:  Head: Normocephalic and atraumatic.  Eyes: Conjunctivae and EOM are normal.  Neck: Normal range of motion.  Cardiovascular: Normal rate and regular rhythm.   Pulmonary/Chest: Effort normal and breath sounds normal. No stridor. No respiratory distress. She has no wheezes.  Abdominal: Soft. She exhibits no distension. There is no tenderness.  Neurological: She is alert and oriented to person, place, and time.  Skin: Skin is warm and dry.  Nursing note and vitals reviewed.    ED Treatments / Results  Labs (all labs ordered are listed, but only abnormal results are displayed) Labs Reviewed  COMPREHENSIVE METABOLIC PANEL - Abnormal; Notable for the following:       Result Value   ALT 12 (*)    All other components within normal limits  D-DIMER, QUANTITATIVE (NOT AT [facility]) - Abnormal; Notable for the following:    D-Dimer, Quant 0.75 (*)    All other components within normal limits  CBC WITH DIFFERENTIAL/PLATELET  TROPONIN I  BRAIN NATRIURETIC PEPTIDE    EKG  EKG Interpretation  Date/Time:  Tuesday June 28 2016 10:55:18 EDT Ventricular Rate:  88 PR Interval:    QRS Duration: 101 QT Interval:  392 QTC Calculation: 475 R Axis:   36 Text Interpretation:  Sinus rhythm aVR higher but otherwise similar to october 2017 Confirmed by [facility] MD, Cas Tracz [phone]) on 06/28/2016 11:50:48 AM       Radiology Dg Chest 2 View  Result Date: 06/28/2016 CLINICAL DATA:  Chest pain EXAM: CHEST  2 VIEW COMPARISON:  Chest radiograph October 23, 2014 and chest CT March 04, 2016 FINDINGS: There is slight atelectasis in both mid and lower lung zones. There is no edema or consolidation. The heart size and pulmonary vascularity are normal. There is atherosclerotic calcification in the aorta. No adenopathy. There is postoperative change consistent  with prior thyroidectomy as well as in the lower cervical spine region. IMPRESSION: Areas of mild atelectatic change bilaterally. No edema or consolidation. Aortic atherosclerosis. No adenopathy. Electronically Signed   By: Lowella Grip III M.D.   On: 06/28/2016 12:53   Ct Angio Chest Pe W And/or Wo Contrast  Result Date:  06/28/2016 CLINICAL DATA:  Shortness of breath for 1 month with progression. EXAM: CT ANGIOGRAPHY CHEST WITH CONTRAST TECHNIQUE: Multidetector CT imaging of the chest was performed using the standard protocol during bolus administration of intravenous contrast. Multiplanar CT image reconstructions and MIPs were obtained to evaluate the vascular anatomy. CONTRAST:  100 mL Isovue 370 COMPARISON:  Two-view chest x-ray 06/28/2016. CTA of the chest 03/04/2016. FINDINGS: Cardiovascular: The heart size is normal. Dilation of the ascending aorta is stable. Pulmonary arterial opacification is excellent. There is some patient breathing motion no obscuring distal segmental arteries in the lower lobes bilaterally. No proximal filling defects are present to suggest pulmonary emboli. Upper lobar arteries are clear bilaterally. Coronary artery calcifications are present. Mediastinum/Nodes: No significant mediastinal or axillary adenopathy is present. Lungs/Pleura: There is mild dependent atelectasis at the lung bases bilaterally, worse on the right. Mild patient breathing motion is noted. Upper Abdomen: Within normal limits. Musculoskeletal: Vertebral body heights and alignment are maintained. No focal lytic or blastic lesions are present. The ribs are unremarkable. Review of the MIP images confirms the above findings. IMPRESSION: 1. No pulmonary embolus. 2. Mild patient breathing motion decreases sensitivity for distal pulmonary emboli. 3. Coronary artery disease. 4. Stable dilation of the ascending aorta measuring up to 4.1 cm. 5. Mild dependent atelectasis bilaterally is worse on the right.  Electronically Signed   By: San Morelle M.D.   On: 06/28/2016 14:48    Procedures Procedures (including critical care time)  Medications Ordered in ED Medications  iopamidol (ISOVUE-370) 76 % injection (100 mLs  Contrast Given 06/28/16 1424)     Initial Impression / Assessment and Plan / ED Course  I have reviewed the triage vital signs and the nursing notes.  Pertinent labs & imaging results that were available during my care of the patient were reviewed by me and considered in my medical decision making (see chart for details).     With some history of calf swelling and tenderness along with mild hypoxia, tachypnea and her story symptoms will get a d-dimer to rule out PE. Mostly concerned for unstable angina and will discuss with cardiology once workup complete.  PE study negative. Cardiology consulted for consideration of admission for ACS rule out.   Final Clinical Impressions(s) / ED Diagnoses   Final diagnoses:  Chest pain, unspecified type    New Prescriptions New Prescriptions   No medications on file     Merrily Pew, MD 06/28/16 [phone]

## 2016-06-28 NOTE — ED Triage Notes (Signed)
Pt brought in by EMS from doctors office due to chest pain. Pt has had chest pain x1 month with worsening symptoms past 2 weeks. Pt endorse SOB, diaphoresis, and nausea. Pt has hx of COPD and stents. Pt a&ox4. Pt unable to lie flat when awake or sleeping.

## 2016-06-30 ENCOUNTER — Telehealth: Payer: Self-pay | Admitting: Cardiology

## 2016-06-30 NOTE — Telephone Encounter (Signed)
Returned call to patient-appt made for 5/1 at 10AM with Dr. Stanford Breed at [facility].  Ok to add on per primary [employer] [facility].  Patient aware and verbalized understanding.

## 2016-06-30 NOTE — Telephone Encounter (Signed)
New message    Pt has a referral to the lipid clinic in our workqueue. When I called pt about scheduling appt she said someone is supposed to call her with an appt with Dr. Stanford Breed because she was just discharged from the hospital. Informed pt his next appt was in July and offered an appt with APP. Pt refused appt with APP. She would like for someone to call her with an appt with only Dr. Stanford Breed.

## 2016-07-01 ENCOUNTER — Encounter: Payer: Self-pay | Admitting: Cardiology

## 2016-07-04 NOTE — Progress Notes (Signed)
HPI: FU CAD. Previously followed by Dr Einar Gip. Pt had NSTEMI 3/87 with complicated PCI of RCA (performed in [facility]). Echo 3/15 showed normal LV function, mild LVH, grade 1 DD, mild RVE. ABI 12/15 normal. Cath 6/16 showed 100 PDA, patent stents in proximal and mid RCA, no obstructive disease in the left system; collaterals to the PDA noted; PTCA of CTO unsuccessful. Last cath 10/17 showed 100 PDA (filled with left to right collaterals), patent RCA stent and normal LV function. CTA 4/18 showed no PE, dilated ascending aorta (4.1 cm). Liver MRI 1/18 showed heterogeneous liver which may be seen in budd chiari and hepatic veno-occlusive disease. H/O cough with ACEI. Myalgias with crestor. Seen in ER 4/28 with CP; enzymes neg., BNP 24. Dr Martinique reviewed and did not think attempt at CTO intervention or CABG was appropriate; Ranexa added. She did not tolerate this stating it caused dizziness.  Since last seen, she continues to describe bouts of chest pain, dyspnea, palpitations and dizziness. No syncope. The symptoms have been intermittent since 2015.   Current Outpatient Prescriptions  Medication Sig Dispense Refill  . acetaminophen (TYLENOL) 500 MG tablet Take 1,000 mg by mouth every 8 (eight) hours as needed (for pain or headahces).     Marland Kitchen albuterol (PROVENTIL HFA;VENTOLIN HFA) 108 (90 BASE) MCG/ACT inhaler Inhale 2 puffs into the lungs every 6 (six) hours as needed for wheezing or shortness of breath.    Marland Kitchen amLODipine (NORVASC) 2.5 MG tablet Take 1 tablet (2.5 mg total) by mouth daily. 90 tablet 3  . aspirin EC 81 MG tablet Take 81 mg by mouth daily.    Marland Kitchen buPROPion (WELLBUTRIN XL) 300 MG 24 hr tablet Take 1 tablet (300 mg total) by mouth daily. 90 tablet 3  . cetirizine (ZYRTEC) 10 MG tablet Take 10 mg by mouth daily.    Derrill Memo ON 07/10/2016] clonazePAM (KLONOPIN) 0.5 MG tablet Take 1 tablet (0.5 mg total) by mouth at bedtime. 30 tablet 0  . clopidogrel (PLAVIX) 75 MG tablet Take 1 tablet (75 mg total) by  mouth daily with breakfast. NEED OV. 90 tablet 0  . ibuprofen (ADVIL,MOTRIN) 200 MG tablet Take 800 mg by mouth every 6 (six) hours as needed for headache or moderate pain.    Marland Kitchen levothyroxine (SYNTHROID) 175 MCG tablet Take 1 tablet (175 mcg total) by mouth daily. 30 tablet 11  . nitroGLYCERIN (NITROSTAT) 0.4 MG SL tablet Place 0.4 mg under the tongue every 5 (five) minutes as needed for chest pain.    . pantoprazole (PROTONIX) 40 MG tablet Take 1 tablet (40 mg total) by mouth daily. 90 tablet 3  . spironolactone (ALDACTONE) 25 MG tablet Take 1 tablet (25 mg total) by mouth daily. 90 tablet 3  . Vitamin D, Cholecalciferol, 1000 units CAPS Take 2,000 Units by mouth daily.     No current facility-administered medications for this visit.      Past Medical History:  Diagnosis Date  . Allergy   . Anxiety   . COPD (chronic obstructive pulmonary disease) ([place])   . Coronary artery disease    a. s/p PCI of RCA in 2015 b. cath in 12/2015 showed 100% Ost RPDA with collaterals present  . DDD (degenerative disc disease), cervical   . Depression   . DJD (degenerative joint disease) of knee    RT  . GERD (gastroesophageal reflux disease)   . Hashimoto's disease   . Headache(784.0)   . Hyperlipidemia   . Hypertension   .  Hypothyroidism   . Myocardial infarction ([place])   . Nocturnal hypoxemia 02/26/2015  . On supplemental oxygen therapy    Oxygen 2.5 l/m at bedtime.  . Stented coronary artery 02/26/2015    Past Surgical History:  Procedure Laterality Date  . ABDOMINAL HYSTERECTOMY    . APPENDECTOMY  1971  . BUNIONECTOMY Right   . CARDIAC CATHETERIZATION N/A 09/02/2014   Procedure: Left Heart Cath and Coronary Angiography;  Surgeon: Adrian Prows, MD;  Location: [facility];  Service: Cardiovascular;  Laterality: N/A;  . CARDIAC CATHETERIZATION N/A 12/15/2015   Procedure: Left Heart Cath and Coronary Angiography;  Surgeon: Jettie Booze, MD;  Location: [facility];  Service:  Cardiovascular;  Laterality: N/A;  . CERVICAL FUSION    . CESAREAN SECTION    . COLONOSCOPY WITH PROPOFOL N/A 02/24/2015   Procedure: COLONOSCOPY WITH PROPOFOL;  Surgeon: Garlan Fair, MD;  Location: [facility];  Service: Endoscopy;  Laterality: N/A;  . ESOPHAGOGASTRODUODENOSCOPY (EGD) WITH PROPOFOL N/A 02/24/2015   Procedure: ESOPHAGOGASTRODUODENOSCOPY (EGD) WITH PROPOFOL;  Surgeon: Garlan Fair, MD;  [facility];  Service: Endoscopy;  Laterality: N/A;  . HAND SURGERY     due to trauma-dog bite.  . I&D EXTREMITY Bilateral 05/17/2013   Procedure: IRRIGATION AND DEBRIDEMENT EXTREMITY;  Surgeon: Roseanne Kaufman, MD;  Location: [facility];  Service: Orthopedics;  Laterality: Bilateral;  . LEFT HEART CATHETERIZATION WITH CORONARY ANGIOGRAM N/A 03/04/2014   Procedure: LEFT HEART CATHETERIZATION WITH CORONARY ANGIOGRAM;  Surgeon: Laverda Page, MD;  Location: [facility] CATH LAB;  Service: Cardiovascular;  Laterality: N/A;  . OOPHORECTOMY    . SHOULDER SURGERY     RT  . THYROIDECTOMY  1996  . TONSILLECTOMY  1965  . TOTAL ABDOMINAL HYSTERECTOMY W/ BILATERAL SALPINGOOPHORECTOMY  01/2008   with cervical dysplasia  . TUBAL LIGATION  1984    Social History   Social History  . Marital status: Married    Spouse name: N/A  . Number of children: 1  . Years of education: N/A   Occupational History  . disabled Disability   Social History Main Topics  . Smoking status: Former Smoker    Packs/day: 2.00    Years: 25.00    Types: Cigarettes    Quit date: 04/23/1996  . Smokeless tobacco: Never Used  . Alcohol use No  . Drug use: No  . Sexual activity: Not Currently   Other Topics Concern  . Not on file   Social History Narrative   On worker's comp for lower back and right shoulder - applying for disability; losing [employer] insurance 03/07/2010    Family History  Problem Relation Age of Onset  . Lung cancer Mother   . Hypertension Mother   . Stroke Mother   . Diabetes Mother     . Diabetes Father   . Hypertension Father   . Heart attack Father   . Asthma Father   . COPD Father   . Alcohol abuse Father   . Diabetes Sister   . Hypertension Sister     ROS: General malaise but no fevers or chills, productive cough, hemoptysis, dysphasia, odynophagia, melena, hematochezia, dysuria, hematuria, rash, seizure activity, orthopnea, PND, pedal edema, claudication. Remaining systems are negative.  Physical Exam: Well-developed well-nourished in no acute distress.  Skin is warm and dry.  HEENT is normal.  Neck is supple.  no bruits  Chest is clear to auscultation with normal expansion.  Cardiovascular exam is regular rate and rhythm.  no  murmurs  Abdominal exam nontender or distended. No masses palpated. Extremities show no edema. neuro grossly intact   A/P  1 coronary artery disease-continue aspirin. Patient is intolerant to statins.  2 chest pain-Difficult situation. Patient continues to describe chest pain and dyspnea. Dr. Martinique reviewed her films and felt CTO was not a good option as vessel was unlikely to remain patent. He also felt bypass was not a good option. Therefore we will plan medical therapy. She has not tolerated nitrates or beta blockade previously. Increase amlodipine to 5 mg daily for anti-anginal. She did not tolerate Ranexa.   3 hypertension-blood pressure is controlled. Continue present medications.  4 hyperlipidemia- Patient is intolerant to statins and Zetia. I will refer to lipid clinic.  Kirk Ruths, MD

## 2016-07-05 ENCOUNTER — Ambulatory Visit (INDEPENDENT_AMBULATORY_CARE_PROVIDER_SITE_OTHER): Payer: Medicare HMO | Admitting: Cardiology

## 2016-07-05 ENCOUNTER — Encounter: Payer: Self-pay | Admitting: Cardiology

## 2016-07-05 VITALS — BP 124/88 | HR 80 | Ht 66.0 in | Wt 199.0 lb

## 2016-07-05 DIAGNOSIS — I10 Essential (primary) hypertension: Secondary | ICD-10-CM | POA: Diagnosis not present

## 2016-07-05 DIAGNOSIS — E7800 Pure hypercholesterolemia, unspecified: Secondary | ICD-10-CM

## 2016-07-05 DIAGNOSIS — I2510 Atherosclerotic heart disease of native coronary artery without angina pectoris: Secondary | ICD-10-CM

## 2016-07-05 MED ORDER — AMLODIPINE BESYLATE 5 MG PO TABS: 5.0000 mg | ORAL_TABLET | Freq: Every day | ORAL | 3 refills | Status: DC

## 2016-07-05 NOTE — Patient Instructions (Signed)
Medication Instructions:   INCREASE AMLODIPINE TO 5 MG ONCE DAILY= 2 OF THE 2.5 MG TABLETS ONCE DAILY  Follow-Up:  Your physician recommends that you schedule a follow-up appointment WITH THE LIPID CLINIC WHEN CONVENIENT  Your physician wants you to follow-up in: [place] will receive a reminder letter in the mail two months in advance. If you don't receive a letter, please call our office to schedule the follow-up appointment.   If you need a refill on your cardiac medications before your next appointment, please call your pharmacy.

## 2016-07-12 ENCOUNTER — Ambulatory Visit
Admission: RE | Admit: 2016-07-12 | Discharge: 2016-07-12 | Disposition: A | Discharge status: Home / Self Care | Payer: Medicare HMO | Source: Ambulatory Visit | Attending: Nurse Practitioner | Admitting: Nurse Practitioner

## 2016-07-12 DIAGNOSIS — K769 Liver disease, unspecified: Secondary | ICD-10-CM | POA: Diagnosis not present

## 2016-07-12 DIAGNOSIS — R932 Abnormal findings on diagnostic imaging of liver and biliary tract: Secondary | ICD-10-CM

## 2016-07-12 DIAGNOSIS — K760 Fatty (change of) liver, not elsewhere classified: Secondary | ICD-10-CM

## 2016-07-12 MED ORDER — GADOXETATE DISODIUM 0.25 MMOL/ML IV SOLN
9.0000 mL | Freq: Once | INTRAVENOUS | Status: AC | PRN
  Administered 2016-07-12: 9 mL via INTRAVENOUS

## 2016-07-14 ENCOUNTER — Ambulatory Visit: Payer: Medicare HMO

## 2016-07-27 DIAGNOSIS — Z0100 Encounter for examination of eyes and vision without abnormal findings: Secondary | ICD-10-CM | POA: Diagnosis not present

## 2016-08-02 ENCOUNTER — Encounter: Payer: Self-pay | Admitting: Family Medicine

## 2016-08-02 ENCOUNTER — Telehealth: Payer: Self-pay | Admitting: Family Medicine

## 2016-08-02 ENCOUNTER — Ambulatory Visit (INDEPENDENT_AMBULATORY_CARE_PROVIDER_SITE_OTHER): Payer: Medicare HMO | Admitting: Family Medicine

## 2016-08-02 VITALS — BP 118/84 | HR 76 | Temp 98.7°F | Ht 66.0 in | Wt 195.0 lb

## 2016-08-02 DIAGNOSIS — F4323 Adjustment disorder with mixed anxiety and depressed mood: Secondary | ICD-10-CM | POA: Diagnosis not present

## 2016-08-02 DIAGNOSIS — G894 Chronic pain syndrome: Secondary | ICD-10-CM | POA: Diagnosis not present

## 2016-08-02 DIAGNOSIS — F419 Anxiety disorder, unspecified: Secondary | ICD-10-CM

## 2016-08-02 DIAGNOSIS — E784 Other hyperlipidemia: Secondary | ICD-10-CM

## 2016-08-02 DIAGNOSIS — E559 Vitamin D deficiency, unspecified: Secondary | ICD-10-CM

## 2016-08-02 DIAGNOSIS — M791 Myalgia, unspecified site: Secondary | ICD-10-CM | POA: Insufficient documentation

## 2016-08-02 DIAGNOSIS — R5383 Other fatigue: Secondary | ICD-10-CM

## 2016-08-02 DIAGNOSIS — E7849 Other hyperlipidemia: Secondary | ICD-10-CM

## 2016-08-02 DIAGNOSIS — E039 Hypothyroidism, unspecified: Secondary | ICD-10-CM | POA: Diagnosis not present

## 2016-08-02 DIAGNOSIS — F5105 Insomnia due to other mental disorder: Secondary | ICD-10-CM

## 2016-08-02 DIAGNOSIS — R413 Other amnesia: Secondary | ICD-10-CM | POA: Diagnosis not present

## 2016-08-02 MED ORDER — AMITRIPTYLINE HCL 25 MG PO TABS: 50.0000 mg | ORAL_TABLET | Freq: Every day | ORAL | 0 refills | Status: DC

## 2016-08-02 MED ORDER — BUPROPION HCL ER (XL) 150 MG PO TB24: 150.0000 mg | ORAL_TABLET | Freq: Every day | ORAL | 0 refills | Status: DC

## 2016-08-02 MED ORDER — DULOXETINE HCL 20 MG PO CPEP: 20.0000 mg | ORAL_CAPSULE | Freq: Every day | ORAL | 0 refills | Status: DC

## 2016-08-02 MED ORDER — BUPROPION HCL ER (XL) 150 MG PO TB24: 300.0000 mg | ORAL_TABLET | Freq: Every day | ORAL | 0 refills | Status: DC

## 2016-08-02 NOTE — Telephone Encounter (Signed)
PT called states Rx/buPROpion/ Wellbutrin 150 MG instructions still say take 2 tablets daily-- she stated thought they were weaning her off the Wellbutrin-- Pls confirm dosage she should be taking. ---Dion Body

## 2016-08-02 NOTE — Assessment & Plan Note (Signed)
DC clonazepam due to excessive tearfulness\crying  Add back Cymbalta-low dose  Decrease Wellbutrin from 300 XL daily to 150 mg XL daily  We will slowly titrate up Cymbalta dose and down Wellbutrin.

## 2016-08-02 NOTE — Assessment & Plan Note (Addendum)
We'll check TSH and T4 today.  We'll change dose Synthroid- non-generic- if needed

## 2016-08-02 NOTE — Assessment & Plan Note (Signed)
We'll add back Cymbalta, Elavil at night-  both of these med changes may help her chronic pain syndrome.

## 2016-08-02 NOTE — Assessment & Plan Note (Signed)
We'll order lipid panel.     Has not been repeated since 10\2017

## 2016-08-02 NOTE — Assessment & Plan Note (Signed)
Wean off of any benzodiazepines.  - I would not recommend these for the patient in the future, and would consider BuSpar if her anxiety symptoms increase and she needs a when necessary medication.

## 2016-08-02 NOTE — Assessment & Plan Note (Signed)
Continue supplementation, check levels today.

## 2016-08-02 NOTE — Assessment & Plan Note (Signed)
>>  ASSESSMENT AND PLAN FOR HYPERLIPIDEMIA- INTOL TO STATINS WRITTEN ON 08/02/2016  6:12 PM BY OPALSKI, [place], DO  We'll order lipid panel.     Has not been repeated since 10\2017

## 2016-08-02 NOTE — Telephone Encounter (Signed)
Spoke with patient regarding wellbutrin rx.  Per office note patient is to wean off.  Patient did pick up the rx but will just take 1 tablet daily until gone.

## 2016-08-02 NOTE — Progress Notes (Signed)
Impression and Recommendations:    1. Adjustment disorder with mixed anxiety and depressed mood   2. Other fatigue\ poor exercise tolerance   3. Memory loss   4. Vitamin D deficiency   5. Hypothyroidism, unspecified type   6. Myalgia/ generalized fibromyalgia   7. Other hyperlipidemia   8. Insomnia secondary to anxiety   9. Chronic pain syndrome      Memory loss Wean off of any benzodiazepines.  - I would not recommend these for the patient in the future, and would consider BuSpar if her anxiety symptoms increase and she needs a when necessary medication.  Vitamin D deficiency Continue supplementation, check levels today.  Hypothyroidism We'll check TSH and T4 today.  We'll change dose Synthroid- non-generic- if needed  Adjustment disorder with mixed anxiety and depressed mood DC clonazepam due to excessive tearfulness\crying  Add back Cymbalta-low dose  Decrease Wellbutrin from 300 XL daily to 150 mg XL daily  We will slowly titrate up Cymbalta dose and down Wellbutrin.  Hyperlipidemia- intol to statins We'll order lipid panel.     Has not been repeated since 10\2017  Insomnia secondary to anxiety Stop benzos.  Trial of Elavil for her sleep especially since she has chronic pain and fibromyalgia  Sleep hygiene discussed  Chronic pain syndrome We'll add back Cymbalta, Elavil at night-  both of these med changes may help her chronic pain syndrome.  Myalgia/ generalized fibromyalgia Cymbalta and Elavil may help   The patient was counseled, risk factors were discussed, anticipatory guidance given.    Meds ordered this encounter  Medications  .       Marland Kitchen DULoxetine (CYMBALTA) 20 MG capsule    Sig: Take 1 capsule (20 mg total) by mouth daily.    Dispense:  90 capsule    Refill:  0  . DISCONTD: buPROPion (WELLBUTRIN XL) 150 MG 24 hr tablet    Sig: Take 2 tablets (300 mg total) by mouth daily.    Dispense:  60 tablet    Refill:  0  .  amitriptyline (ELAVIL) 25 MG tablet    Sig: Take 2 tablets (50 mg total) by mouth at bedtime.    Dispense:  180 tablet    Refill:  0  . buPROPion (WELLBUTRIN XL) 150 MG 24 hr tablet    Sig: Take 1 tablet (150 mg total) by mouth daily.    Dispense:  30 tablet    Refill:  0     Discontinued Medications   CLONAZEPAM (KLONOPIN) 0.5 MG TABLET    Take 1 tablet (0.5 mg total) by mouth at bedtime.   CLONAZEPAM (KLONOPIN) 0.5 MG TABLET          Orders Placed This Encounter  Procedures  . Lipid panel  . T4, free  . TSH  . Vitamin B12  . VITAMIN D 25 Hydroxy (Vit-D Deficiency, Fractures)     Gross side effects, risk and benefits, and alternatives of medications and treatment plan in general discussed with patient.  Patient is aware that all medications have potential side effects and we are unable to predict every side effect or drug-drug interaction that may occur.   Patient will call with any questions prior to using medication if they have concerns.  Expresses verbal understanding and consents to current therapy and treatment regimen.  No barriers to understanding were identified.  Red flag symptoms and signs discussed in detail.  Patient expressed understanding regarding what to do in case of emergency\urgent  symptoms  Please see AVS handed out to patient at the end of our visit for further patient instructions/ counseling done pertaining to today's office visit.   Return in about 4 weeks (around 08/30/2016) for Add amitriptyline, decrease Wellbutrin, off clonazepam, add Cymbalta.   Next OV we will consider increasing Cymbalta and weaning off Wellbutrin     Note: This document was prepared using Dragon voice recognition software and may include unintentional dictation errors.  Mellody Dance 6:17  PM --------------------------------------------------------------------------------------------------------------------------------------------------------------------------------------------------------------------------------------------    Subjective:    CC:  Chief Complaint  Patient presents with  . Hypothyroidism  . mood disorder  . Medication Management    HPI: Amy Hodge is a 61 y.o. female who presents to [facility] today for issues as discussed below.  Mood d/o; anxiety >er than depression:  Patient here today to discuss her history of anxiety and depression.  Recent office visit with Valetta Fuller in March- patient was put on clonazepam to help with her sleep as she was on it prior and it worked well.  Patient feels she is more tearful and crying at the drop of a hat now because of being on this medicine.   She denies feeling depressed but is extremely emotional and tearful.   She has historically battled more anxiety than depression.    She also felt she was more easily agitated or angered and cut back on her Wellbutrin from 300 mg daily XL to every other day.  This did help her feel better with her impatience and irritability.  Her past PCP,  prior to seeing Valetta Fuller, took her off Cymbalta and changed her to Lexapro because she felt that the Cymbalta was losing its effect.  Her hope was to increase the dose and he changed it altogether.   Cymbalta worked very well - especially for her chronic pain syndrome, fibromyalgia etc.   She denies suicidal or homicidal ideation.    Also scared to take benzo's or anything that will affect her memory.   ---- Sleep issues: She has never been on Elavil to help her sleep and states she also has  fibromyalgia and some restless leg syndrome type symptoms at night.   --- She also has concerns for her thyroid function and says that she hasn't had labs in many months.   --- She also was told her vitamin D was extremely  low in the past by a cardiologist and takes supplements.   Wt Readings from Last 3 Encounters:  08/02/16 195 lb (88.5 kg)  07/05/16 199 lb (90.3 kg)  06/28/16 199 lb (90.3 kg)   BP Readings from Last 3 Encounters:  08/02/16 118/84  07/05/16 124/88  06/28/16 (!) 120/99   Pulse Readings from Last 3 Encounters:  08/02/16 76  07/05/16 80  06/28/16 (!) 110   BMI Readings from Last 3 Encounters:  08/02/16 31.47 kg/m  07/05/16 32.12 kg/m  06/28/16 32.12 kg/m     Patient Care Team    Relationship Specialty Notifications Start End  Mellody Dance, DO PCP - General Family Medicine  07/05/16   Lelon Perla, MD Consulting Physician Cardiology  05/10/16   Deneise Lever, MD Consulting Physician Pulmonary Disease  05/10/16   Roseanne Kaufman, MD Consulting Physician Orthopedic Surgery  05/10/16   Garlan Fair, MD Consulting Physician Gastroenterology  05/10/16      Patient Active Problem List   Diagnosis Date Noted  . Adjustment disorder with mixed anxiety and depressed mood  08/02/2016    Priority: High  . Stented coronary artery 02/26/2015    Priority: High  . Coronary artery disease 04/08/2013    Priority: High  . Hyperlipidemia- intol to statins 06/14/2007    Priority: High  . COPD mixed type ([place]) 06/14/2007    Priority: High  . Insomnia secondary to anxiety 05/10/2016    Priority: Medium  . Obstructive sleep apnea 08/26/2015    Priority: Medium  . Centrilobular emphysema ([place]) 06/24/2014    Priority: Medium  . Hypothyroidism 10/06/2006    Priority: Medium  . Myalgia/ generalized fibromyalgia 08/02/2016    Priority: Low  . Vitamin D deficiency 10/07/2009    Priority: Low  . Chronic pain syndrome 02/20/2009    Priority: Low  . COMMON MIGRAINE 02/20/2009    Priority: Low  . Fatigue- chronic 07/15/2008    Priority: Low  . GERD 10/06/2006    Priority: Low  . ACS (acute coronary syndrome) 06/28/2016  . Crescendo angina ([place]) 06/28/2016  . Cough 04/28/2016   . Lung nodule 08/26/2015  . Shingles rash 08/04/2015  . Allergic reaction 06/27/2015  . History of hemoptysis 06/27/2015  . Nocturnal hypoxemia 02/26/2015  . Hemoptysis 10/24/2014  . Soft tissue injury of multiple sites/bilateral hands 05/16/2013  . Bite from dog 05/15/2013  . Chest pain 05/15/2013  . COPD exacerbation ([place]) 12/31/2010  . Health care maintenance 12/25/2010  . MENOPAUSAL DISORDER 02/16/2010  . OSTEOARTHRITIS, KNEE, RIGHT 02/20/2009  . BACK PAIN 11/27/2008  . Memory loss 07/15/2008  . PERIPHERAL EDEMA 07/15/2008  . KNEE PAIN, BILATERAL 09/05/2007  . Seasonal and perennial allergic rhinitis 06/14/2007  . Anxiety state 10/06/2006  . Depression 10/06/2006    Past Medical history, Surgical history, Family history, Social history, Allergies and Medications have been entered into the medical record, reviewed and changed as needed.    Current Meds  Medication Sig  . acetaminophen (TYLENOL) 500 MG tablet Take 1,000 mg by mouth every 8 (eight) hours as needed (for pain or headahces).   Marland Kitchen albuterol (PROVENTIL HFA;VENTOLIN HFA) 108 (90 BASE) MCG/ACT inhaler Inhale 2 puffs into the lungs every 6 (six) hours as needed for wheezing or shortness of breath.  Marland Kitchen amLODipine (NORVASC) 2.5 MG tablet Take 2.5 mg by mouth daily.  Marland Kitchen amLODipine (NORVASC) 5 MG tablet Take 1 tablet (5 mg total) by mouth daily.  Marland Kitchen aspirin EC 81 MG tablet Take 81 mg by mouth daily.  Marland Kitchen buPROPion (WELLBUTRIN XL) 150 MG 24 hr tablet Take 1 tablet (150 mg total) by mouth daily.  . cetirizine (ZYRTEC) 10 MG tablet Take 10 mg by mouth daily.  . clopidogrel (PLAVIX) 75 MG tablet Take 1 tablet (75 mg total) by mouth daily with breakfast. NEED OV.  Marland Kitchen ibuprofen (ADVIL,MOTRIN) 200 MG tablet Take 800 mg by mouth every 6 (six) hours as needed for headache or moderate pain.  Marland Kitchen levothyroxine (SYNTHROID) 175 MCG tablet Take 1 tablet (175 mcg total) by mouth daily.  . nitroGLYCERIN (NITROSTAT) 0.4 MG SL tablet Place 0.4 mg  under the tongue every 5 (five) minutes as needed for chest pain.  . pantoprazole (PROTONIX) 40 MG tablet Take 1 tablet (40 mg total) by mouth daily.  Marland Kitchen spironolactone (ALDACTONE) 25 MG tablet Take 1 tablet (25 mg total) by mouth daily.  . Vitamin D, Cholecalciferol, 1000 units CAPS Take 2,000 Units by mouth daily.  . [DISCONTINUED] buPROPion (WELLBUTRIN XL) 150 MG 24 hr tablet Take 2 tablets (300 mg total) by mouth daily.  . [DISCONTINUED]  buPROPion (WELLBUTRIN XL) 300 MG 24 hr tablet Take 1 tablet (300 mg total) by mouth daily.  . [DISCONTINUED] clonazePAM (KLONOPIN) 0.5 MG tablet Take 1 tablet (0.5 mg total) by mouth at bedtime.    Allergies:  Allergies  Allergen Reactions  . Bactrim [Sulfamethoxazole-Trimethoprim] Shortness Of Breath, Anxiety and Palpitations  . Valsartan Other (See Comments) and Cough    Headaches also   . Bystolic [Nebivolol Hcl] Other (See Comments)    Fatigue- Tired feeling  . Isosorbide Other (See Comments)    HEADACHES   . Levofloxacin Hives  . Lisinopril Cough  . Metoprolol Other (See Comments)    FATIGUE  . Morphine And Related Other (See Comments)    headache  . Simvastatin Other (See Comments)    Makes legs hurt/ sore   . Statins Other (See Comments)    MYALGIAS  . Tetracycline Hives  . Toradol [Ketorolac Tromethamine] Palpitations     Review of Systems: General:   Denies fever, chills, unexplained weight loss.  Optho/Auditory:   Denies visual changes, blurred vision/LOV Respiratory:   Denies wheeze, DOE more than baseline levels.  Cardiovascular:   Denies chest pain, palpitations, new onset peripheral edema  Gastrointestinal:   Denies nausea, vomiting, diarrhea, abd pain.  Genitourinary: Denies dysuria, freq/ urgency, flank pain or discharge from genitals.  Endocrine:     Denies hot or cold intolerance, polyuria, polydipsia. Musculoskeletal:   Denies unexplained myalgias, joint swelling, unexplained arthralgias, gait problems.  Skin:   Denies new onset rash, suspicious lesions Neurological:     Denies dizziness, unexplained weakness, numbness  Psychiatric/Behavioral:   Denies mood changes, suicidal or homicidal ideations, hallucinations    Objective:   Blood pressure 118/84, pulse 76, temperature 98.7 F (37.1 C), temperature source Oral, height 5\' 6"  (1.676 m), weight 195 lb (88.5 kg), SpO2 95 %. Body mass index is 31.47 kg/m. General:  Well Developed, well nourished, appropriate for stated age.  Neuro:  Alert and oriented,  extra-ocular muscles intact  HEENT:  Normocephalic, atraumatic, neck supple, no carotid bruits appreciated  Skin:  no gross rash, warm, pink. Cardiac:  RRR, S1 S2 Respiratory:  ECTA B/L and A/P, Not using accessory muscles, speaking in full sentences- unlabored. Vascular:  Ext warm, no cyanosis apprec.; cap RF less 2 sec. Psych:  No HI/SI, judgement and insight good, Euthymic mood. Full Affect.

## 2016-08-02 NOTE — Assessment & Plan Note (Signed)
Stop benzos.  Trial of Elavil for her sleep especially since she has chronic pain and fibromyalgia  Sleep hygiene discussed

## 2016-08-02 NOTE — Assessment & Plan Note (Signed)
Cymbalta and Elavil may help

## 2016-08-02 NOTE — Patient Instructions (Addendum)
Please wean down off of your clonazepam.  Please take a half a tablet every day for 4 days.  Then one half of your tablet every other day for 4 days then off.  You may start the Elavil\amitriptyline this evening.  We will see back in 4 weeks to discuss other changes in medications.    Please note that we did decrease your Wellbutrin to half of the dose currently is at - 150 mg tablet you'll take daily.    You may start your Cymbalta as well tomorrow morning.  Please continue to exercise to a goal of 30 minutes daily.  This will help your mood as well as her physical symptoms.

## 2016-08-03 LAB — SPECIMEN STATUS

## 2016-08-03 LAB — LIPID PANEL
CHOLESTEROL TOTAL: 206 mg/dL — AB (ref 100–199)
Chol/HDL Ratio: 5.2 ratio — ABNORMAL HIGH (ref 0.0–4.4)
HDL: 40 mg/dL (ref >39)
LDL CALC: 115 mg/dL — AB (ref 0–99)
Triglycerides: 253 mg/dL — ABNORMAL HIGH (ref 0–149)
VLDL Cholesterol Cal: 51 mg/dL — ABNORMAL HIGH (ref 5–40)

## 2016-08-03 LAB — VITAMIN B12: VITAMIN B 12: 451 pg/mL (ref 232–1245)

## 2016-08-03 LAB — VITAMIN D 25 HYDROXY (VIT D DEFICIENCY, FRACTURES): VIT D 25 HYDROXY: 33.9 ng/mL (ref 30.0–100.0)

## 2016-08-03 LAB — TSH: TSH: 1.6 u[IU]/mL (ref 0.450–4.500)

## 2016-08-03 LAB — T4, FREE: Free T4: 1.93 ng/dL — ABNORMAL HIGH (ref 0.82–1.77)

## 2016-08-09 ENCOUNTER — Telehealth: Payer: Self-pay

## 2016-08-09 DIAGNOSIS — R5383 Other fatigue: Secondary | ICD-10-CM

## 2016-08-09 DIAGNOSIS — M791 Myalgia, unspecified site: Secondary | ICD-10-CM

## 2016-08-09 NOTE — Telephone Encounter (Signed)
CBC was not done because an order was not sent.  They called the office 08/03/2016 to confirm a CBC was not needed since they did have the blood but [facility] was advised it was not needed.

## 2016-08-09 NOTE — Telephone Encounter (Signed)
-----   Message from Mellody Dance, DO sent at 08/09/2016 12:47 AM EDT ----- Cholesterol largely unchanged from prior.  We will discuss specifics at your upcoming office visit with me time permitting.  Please follow low saturated and trans fat diet and cut back on white flour products/ carbs.   Please ask your Cardiologist about these results in near future as well since you are under their care.   Thyroid levels look ok, if you feel well, I rec cont same dose of meds.  Vit D levels are normal but low normal and not optimized.   Please inc from 2000 iu daily to 5000 IU daily.  CBC results have not resulted.  I have sent this to my medical assistants as well so they may track down those results/ find out why labs were not resulted.  We will likely have an answer for you on that at next office visit with me in a couple weeks.   Thanks,  Be well,   Dr Raliegh Scarlet

## 2016-08-10 ENCOUNTER — Ambulatory Visit: Payer: Commercial Managed Care - HMO | Admitting: Adult Health

## 2016-08-24 ENCOUNTER — Other Ambulatory Visit: Payer: Self-pay | Admitting: Cardiology

## 2016-08-30 ENCOUNTER — Encounter: Payer: Self-pay | Admitting: Family Medicine

## 2016-08-30 ENCOUNTER — Ambulatory Visit (INDEPENDENT_AMBULATORY_CARE_PROVIDER_SITE_OTHER): Payer: Medicare HMO | Admitting: Family Medicine

## 2016-08-30 VITALS — BP 118/85 | HR 82 | Ht 65.0 in | Wt 189.2 lb

## 2016-08-30 DIAGNOSIS — E890 Postprocedural hypothyroidism: Secondary | ICD-10-CM | POA: Diagnosis not present

## 2016-08-30 DIAGNOSIS — G894 Chronic pain syndrome: Secondary | ICD-10-CM | POA: Diagnosis not present

## 2016-08-30 DIAGNOSIS — F5104 Psychophysiologic insomnia: Secondary | ICD-10-CM

## 2016-08-30 DIAGNOSIS — F4323 Adjustment disorder with mixed anxiety and depressed mood: Secondary | ICD-10-CM | POA: Diagnosis not present

## 2016-08-30 DIAGNOSIS — M791 Myalgia, unspecified site: Secondary | ICD-10-CM

## 2016-08-30 DIAGNOSIS — E782 Mixed hyperlipidemia: Secondary | ICD-10-CM | POA: Diagnosis not present

## 2016-08-30 DIAGNOSIS — E559 Vitamin D deficiency, unspecified: Secondary | ICD-10-CM

## 2016-08-30 DIAGNOSIS — R5382 Chronic fatigue, unspecified: Secondary | ICD-10-CM | POA: Diagnosis not present

## 2016-08-30 MED ORDER — BUPROPION HCL ER (XL) 150 MG PO TB24: 150.0000 mg | ORAL_TABLET | Freq: Every day | ORAL | 0 refills | Status: DC

## 2016-08-30 MED ORDER — DULOXETINE HCL 20 MG PO CPEP: 20.0000 mg | ORAL_CAPSULE | Freq: Every day | ORAL | 0 refills | Status: DC

## 2016-08-30 MED ORDER — AMITRIPTYLINE HCL 25 MG PO TABS: 50.0000 mg | ORAL_TABLET | Freq: Every day | ORAL | 1 refills | Status: DC

## 2016-08-30 MED ORDER — LEVOTHYROXINE SODIUM 175 MCG PO TABS
175.0000 ug | ORAL_TABLET | Freq: Every day | ORAL | 11 refills | Status: DC
Start: 2016-08-30 — End: 2017-03-30

## 2016-08-30 NOTE — Progress Notes (Signed)
Impression and Recommendations:    1. Adjustment disorder with mixed anxiety and depressed mood   2. Mixed hyperlipidemia   3. Postoperative hypothyroidism   4. Chronic pain syndrome   5. Chronic fatigue   6. Myalgia/ generalized fibromyalgia   7. Vitamin D deficiency   8. Psychophysiological insomnia    1-continue Wellbutrin and Cymbalta.  Patient doing extremely well. 2- patient has done intensive dietary and lifestyle changes over the past one month.  Since she is intolerant to cholesterol medications and declines any of them, strongly encouraged to continue her healthier lifestyle.  Handouts provided on low saturated and transplant diet as well as healthy eating and weight loss. 3- continue current dose of levothyroxine.  Even though T4 slightly up, TSH within normal limits and patient feels fantastic and declines meds changed today.  Been on this dose for many years. 4- for her chronic pain she is doing extremely well on the Cymbalta and Elavil.  Continue medications.  Continue to walk and exercise 1.5 miles daily 5- fatigue improving on current medications and with current lifestyle changes. 6- Cymbalta and Elavil doing extremely well for her fibromyalgia.  She is very happy.  Explained exercise will help tremendously as well. 7- per vitamin D deficiency she will continue supplementation. 8-  continue Elavil for her insomnia.  This is working well and she wakes up feeling rested and well.  The patient was counseled, risk factors were discussed, anticipatory guidance given.  Modified Medications   Modified Medication Previous Medication   AMITRIPTYLINE (ELAVIL) 25 MG TABLET amitriptyline (ELAVIL) 25 MG tablet      Take 2 tablets (50 mg total) by mouth at bedtime.    Take 2 tablets (50 mg total) by mouth at bedtime.   BUPROPION (WELLBUTRIN XL) 150 MG 24 HR TABLET buPROPion (WELLBUTRIN XL) 150 MG 24 hr tablet      Take 1 tablet (150 mg total) by mouth daily.    Take 1 tablet  (150 mg total) by mouth daily.   DULOXETINE (CYMBALTA) 20 MG CAPSULE DULoxetine (CYMBALTA) 20 MG capsule      Take 1 capsule (20 mg total) by mouth daily.    Take 1 capsule (20 mg total) by mouth daily.   LEVOTHYROXINE (SYNTHROID) 175 MCG TABLET levothyroxine (SYNTHROID) 175 MCG tablet      Take 1 tablet (175 mcg total) by mouth daily.    Take 1 tablet (175 mcg total) by mouth daily.    Gross side effects, risk and benefits, and alternatives of medications and treatment plan in general discussed with patient.  Patient is aware that all medications have potential side effects and we are unable to predict every side effect or drug-drug interaction that may occur.   Patient will call with any questions prior to using medication if they have concerns.  Expresses verbal understanding and consents to current therapy and treatment regimen.  No barriers to understanding were identified.  Red flag symptoms and signs discussed in detail.  Patient expressed understanding regarding what to do in case of emergency\urgent symptoms  Please see AVS handed out to patient at the end of our visit for further patient instructions/ counseling done pertaining to today's office visit.   Return for 3-4 mo- f/up mood, CPS, Fatigue, thyroid, chol, wt loss, fitness--> mental and physical.     Note: This document was prepared using Dragon voice recognition software and may include unintentional dictation errors.  [name] 9:04 AM --------------------------------------------------------------------------------------------------------------------------------------------------------------------------------------------------------------------------------------------  Subjective:    CC:  Chief Complaint  Patient presents with  . Hyperlipidemia    follow up  . Hypothyroidism    follow up     HPI: Amy Hodge is a 61 y.o. female who presents to [place] at [facility] today for issues  as discussed below.   Mood:  wellbutrin tol well, no S-E. Feels the best she has in years.  Feels "I'm a god-sent."   No anxiety, doesn't worry about anything anymore, I love my husband again, we laugh again, I genuinely enjoy life again.     Weened off her clonazepam; she is taking the elavikll - works great for her sleep.  Feels rested and normal again.    Started cymbalta--> and weened down to 150mg  wellbturtin--> feels great and deosn';t want tot change a thing.   Before she was taking tylenol a lot--now since startin g cymbalta- not even taking those.  She is walking 1.29miles per day M-F.  Energy levels are good.   Wt lost:   She lost 10lbs  According to herscale at home.  Breathing heavy with walking and some CP's but she slws down and feels great/ goes away.  Haqsn't detered her from exercising- she does what she can. .       Goals fro last OV:  Please wean down off of your clonazepam.  Please take a half a tablet every day for 4 days.  Then one half of your tablet every other day for 4 days then off.  You may start the Elavil\amitriptyline this evening.  We will see back in 4 weeks to discuss other changes in medications.    Please note that we did decrease your Wellbutrin to half of the dose currently is at - 150 mg tablet you'll take daily.    You may start your Cymbalta as well tomorrow morning.  Please continue to exercise to a goal of 30 minutes daily.  This will help your mood as well as her physical symptoms.  Return in about 4 weeks (around 08/30/2016) for Add amitriptyline, decrease Wellbutrin, off clonazepam, add Cymbalta.   Next OV we will consider increasing Cymbalta and weaning off Wellbutrin      Problem  post-operative Hypothyroidism   Qualifier: Diagnosis of  By: Jenny Reichmann MD, Hunt Oris       Wt Readings from Last 3 Encounters:  08/30/16 189 lb 3.2 oz (85.8 kg)  08/02/16 195 lb (88.5 kg)  07/05/16 199 lb (90.3 kg)   BP Readings from Last 3  Encounters:  08/30/16 118/85  08/02/16 118/84  07/05/16 124/88   Pulse Readings from Last 3 Encounters:  08/30/16 82  08/02/16 76  07/05/16 80   BMI Readings from Last 3 Encounters:  08/30/16 31.48 kg/m  08/02/16 31.47 kg/m  07/05/16 32.12 kg/m     Patient Care Team    Relationship Specialty Notifications Start End  Mellody Dance, DO PCP - General Family Medicine  07/05/16   Lelon Perla, MD Consulting Physician Cardiology  05/10/16   Deneise Lever, MD Consulting Physician Pulmonary Disease  05/10/16   Roseanne Kaufman, MD Consulting Physician Orthopedic Surgery  05/10/16   Garlan Fair, MD Consulting Physician Gastroenterology  05/10/16      Patient Active Problem List   Diagnosis Date Noted  . Adjustment disorder with mixed anxiety and depressed mood 08/02/2016    Priority: High  . Stented coronary artery 02/26/2015    Priority: High  .  Coronary artery disease 04/08/2013    Priority: High  . Hyperlipidemia- intol to statins 06/14/2007    Priority: High  . COPD mixed type ([place]) 06/14/2007    Priority: High  . Insomnia secondary to anxiety 05/10/2016    Priority: Medium  . Obstructive sleep apnea 08/26/2015    Priority: Medium  . Centrilobular emphysema ([place]) 06/24/2014    Priority: Medium  . post-operative Hypothyroidism 10/06/2006    Priority: Medium  . Myalgia/ generalized fibromyalgia 08/02/2016    Priority: Low  . Vitamin D deficiency 10/07/2009    Priority: Low  . Chronic pain syndrome 02/20/2009    Priority: Low  . COMMON MIGRAINE 02/20/2009    Priority: Low  . Fatigue- chronic 07/15/2008    Priority: Low  . GERD 10/06/2006    Priority: Low  . ACS (acute coronary syndrome) 06/28/2016  . Crescendo angina ([place]) 06/28/2016  . Cough 04/28/2016  . Lung nodule 08/26/2015  . Shingles rash 08/04/2015  . Allergic reaction 06/27/2015  . History of hemoptysis 06/27/2015  . Nocturnal hypoxemia 02/26/2015  . Hemoptysis 10/24/2014  . Soft tissue  injury of multiple sites/bilateral hands 05/16/2013  . Bite from dog 05/15/2013  . Chest pain 05/15/2013  . COPD exacerbation ([place]) 12/31/2010  . Health care maintenance 12/25/2010  . MENOPAUSAL DISORDER 02/16/2010  . OSTEOARTHRITIS, KNEE, RIGHT 02/20/2009  . BACK PAIN 11/27/2008  . Memory loss 07/15/2008  . PERIPHERAL EDEMA 07/15/2008  . KNEE PAIN, BILATERAL 09/05/2007  . Seasonal and perennial allergic rhinitis 06/14/2007  . Anxiety state 10/06/2006  . Depression 10/06/2006    Past Medical history, Surgical history, Family history, Social history, Allergies and Medications have been entered into the medical record, reviewed and changed as needed.    Current Meds  Medication Sig  . acetaminophen (TYLENOL) 500 MG tablet Take 1,000 mg by mouth every 8 (eight) hours as needed (for pain or headahces).   Marland Kitchen albuterol (PROVENTIL HFA;VENTOLIN HFA) 108 (90 BASE) MCG/ACT inhaler Inhale 2 puffs into the lungs every 6 (six) hours as needed for wheezing or shortness of breath.  Marland Kitchen amitriptyline (ELAVIL) 25 MG tablet Take 2 tablets (50 mg total) by mouth at bedtime.  Marland Kitchen amLODipine (NORVASC) 2.5 MG tablet Take 2.5 mg by mouth daily.  Marland Kitchen aspirin EC 81 MG tablet Take 81 mg by mouth daily.  Marland Kitchen buPROPion (WELLBUTRIN XL) 150 MG 24 hr tablet Take 1 tablet (150 mg total) by mouth daily.  . cetirizine (ZYRTEC) 10 MG tablet Take 10 mg by mouth daily.  . clopidogrel (PLAVIX) 75 MG tablet TAKE 1 TABLET EVERY DAY WITH BREAKFAST (NEED MD APPOINTMENT)  . DULoxetine (CYMBALTA) 20 MG capsule Take 1 capsule (20 mg total) by mouth daily.  Marland Kitchen ibuprofen (ADVIL,MOTRIN) 200 MG tablet Take 800 mg by mouth every 6 (six) hours as needed for headache or moderate pain.  Marland Kitchen levothyroxine (SYNTHROID) 175 MCG tablet Take 1 tablet (175 mcg total) by mouth daily.  . nitroGLYCERIN (NITROSTAT) 0.4 MG SL tablet Place 0.4 mg under the tongue every 5 (five) minutes as needed for chest pain.  . pantoprazole (PROTONIX) 40 MG tablet Take 1  tablet (40 mg total) by mouth daily.  Marland Kitchen spironolactone (ALDACTONE) 25 MG tablet Take 1 tablet (25 mg total) by mouth daily.  . Vitamin D, Cholecalciferol, 1000 units CAPS Take 2,000 Units by mouth daily.  . [DISCONTINUED] amitriptyline (ELAVIL) 25 MG tablet Take 2 tablets (50 mg total) by mouth at bedtime.  . [DISCONTINUED] buPROPion (WELLBUTRIN XL) 150 MG 24  hr tablet Take 1 tablet (150 mg total) by mouth daily.  . [DISCONTINUED] DULoxetine (CYMBALTA) 20 MG capsule Take 1 capsule (20 mg total) by mouth daily.  . [DISCONTINUED] levothyroxine (SYNTHROID) 175 MCG tablet Take 1 tablet (175 mcg total) by mouth daily.    Allergies:  Allergies  Allergen Reactions  . Bactrim [Sulfamethoxazole-Trimethoprim] Shortness Of Breath, Anxiety and Palpitations  . Valsartan Other (See Comments) and Cough    Headaches also   . Bystolic [Nebivolol Hcl] Other (See Comments)    Fatigue- Tired feeling  . Isosorbide Other (See Comments)    HEADACHES   . Levofloxacin Hives  . Lisinopril Cough  . Metoprolol Other (See Comments)    FATIGUE  . Morphine And Related Other (See Comments)    headache  . Simvastatin Other (See Comments)    Makes legs hurt/ sore   . Statins Other (See Comments)    MYALGIAS  . Tetracycline Hives  . Toradol [Ketorolac Tromethamine] Palpitations     Review of Systems: General:   Denies fever, chills, unexplained weight loss.  Optho/Auditory:   Denies visual changes, blurred vision/LOV Respiratory:   Denies wheeze, DOE more than baseline levels.  Cardiovascular:   Denies chest pain, palpitations, new onset peripheral edema  Gastrointestinal:   Denies nausea, vomiting, diarrhea, abd pain.  Genitourinary: Denies dysuria, freq/ urgency, flank pain or discharge from genitals.  Endocrine:     Denies hot or cold intolerance, polyuria, polydipsia. Musculoskeletal:   Denies unexplained myalgias, joint swelling, unexplained arthralgias, gait problems.  Skin:  Denies new onset rash,  suspicious lesions Neurological:     Denies dizziness, unexplained weakness, numbness  Psychiatric/Behavioral:   Denies mood changes, suicidal or homicidal ideations, hallucinations    Objective:   Blood pressure 118/85, pulse 82, height 5\' 5"  (1.651 m), weight 189 lb 3.2 oz (85.8 kg). Body mass index is 31.48 kg/m. General:  Well Developed, well nourished, appropriate for stated age.  Neuro:  Alert and oriented,  extra-ocular muscles intact  HEENT:  Normocephalic, atraumatic, neck supple, no carotid bruits appreciated  Skin:  no gross rash, warm, pink. Cardiac:  RRR, S1 S2 Respiratory:  ECTA B/L and A/P, Not using accessory muscles, speaking in full sentences- unlabored. Vascular:  Ext warm, no cyanosis apprec.; cap RF less 2 sec. Psych:  No HI/SI, judgement and insight good, Euthymic mood. Full Affect.

## 2016-08-30 NOTE — Patient Instructions (Signed)
Guidelines for a Low Cholesterol, Low Saturated Fat Diet   Fats - Limit total intake of fats and oils. - Avoid butter, stick margarine, shortening, lard, palm and coconut oils. - Limit mayonnaise, salad dressings, gravies and sauces, unless they are homemade with low-fat ingredients. - Limit chocolate. - Choose low-fat and nonfat products, such as low-fat mayonnaise, low-fat or non-hydrogenated peanut butter, low-fat or fat-free salad dressings and nonfat gravy. - Use vegetable oil, such as canola or olive oil. - Look for margarine that does not contain trans fatty acids. - Use nuts in moderate amounts. - Read ingredient labels carefully to determine both amount and type of fat present in foods. Limit saturated and trans fats! - Avoid high-fat processed and convenience foods.  Meats and Meat Alternatives - Choose fish, chicken, [place] and lean meats. - Use dried beans, peas, lentils and tofu. - Limit egg yolks to three to four per week. - If you eat red meat, limit to no more than three servings per week and choose loin or round cuts. - Avoid fatty meats, such as bacon, sausage, franks, luncheon meats and ribs. - Avoid all organ meats, including liver.  Dairy - Choose nonfat or low-fat milk, yogurt and cottage cheese. - Most cheeses are high in fat. Choose cheeses made from non-fat milk, such as mozzarella and ricotta cheese. - Choose light or fat-free cream cheese and sour cream. - Avoid cream and sauces made with cream.  Fruits and Vegetables - Eat a wide variety of fruits and vegetables. - Use lemon juice, vinegar or "mist" olive oil on vegetables. - Avoid adding sauces, fat or oil to vegetables.  Breads, Cereals and Grains - Choose whole-grain breads, cereals, pastas and rice. - Avoid high-fat snack foods, such as granola, cookies, pies, pastries, doughnuts and croissants.  Cooking Tips - Avoid deep fried foods. - Trim visible fat off meats and remove skin from poultry  before cooking. - Bake, broil, boil, poach or roast poultry, fish and lean meats. - Drain and discard fat that drains out of meat as you cook it. - Add little or no fat to foods. - Use vegetable oil sprays to grease pans for cooking or baking. - Steam vegetables. - Use herbs or no-oil marinades to flavor foods.    Behavior Modification Ideas for Weight Management  Weight management involves adopting a healthy lifestyle that includes a knowledge of nutrition and exercise, a positive attitude and the right kind of motivation. Internal motives such as better health, increased energy, self-esteem and personal control increase your chances of lifelong weight management success.  Remember to have realistic goals and think long-term success. Believe in yourself and you can do it. The following information will give you ideas to help you meet your goals.  Control Your Home Environment  Eat only while sitting down at the kitchen or dining room table. Do not eat while watching television, reading, cooking, talking on the phone, standing at the refrigerator or working on the computer. Keep tempting foods out of the house - don't buy them. Keep tempting foods out of sight. Have low-calorie foods ready to eat. Unless you are preparing a meal, stay out of the kitchen. Have healthy snacks at your disposal, such as small pieces of fruit, vegetables, canned fruit, pretzels, low-fat string cheese and nonfat cottage cheese.  Control Your Work Environment  Do not eat at [employer] or keep tempting snacks at your desk. If you get hungry between meals, plan healthy snacks and bring them  with you to work. During your breaks, go for a walk instead of eating. If you work around food, plan in advance the one item you will eat at mealtime. Make it inconvenient to nibble on food by chewing gum, sugarless candy or drinking water or another low-calorie beverage. Do not work through meals. Skipping meals slows down  metabolism and may result in overeating at the next meal. If food is available for special occasions, either pick the healthiest item, nibble on low-fat snacks brought from home, don't have anything offered, choose one option and have a small amount, or have only a beverage.  Control Your Mealtime Environment  Serve your plate of food at the stove or kitchen counter. Do not put the serving dishes on the table. If you do put dishes on the table, remove them immediately when finished eating. Fill half of your plate with vegetables, a quarter with lean protein and a quarter with starch. Use smaller plates, bowls and glasses. A smaller portion will look large when it is in a little dish. Politely refuse second helpings. When fixing your plate, limit portions of food to one scoop/serving or less.   Daily Food Management  Replace eating with another activity that you will not associate with food. Wait 20 minutes before eating something you are craving. Drink a large glass of water or diet soda before eating. Always have a big glass or bottle of water to drink throughout the day. Avoid high-calorie add-ons such as cream with your coffee, butter, mayonnaise and salad dressings.  Shopping: Do not shop when hungry or tired. Shop from a list and avoid buying anything that is not on your list. If you must have tempting foods, buy individual-sized packages and try to find a lower-calorie alternative. Don't taste test in the store. Read food labels. Compare products to help you make the healthiest choices.  Preparation: Chew a piece of gum while cooking meals. Use a quarter teaspoon if you taste test your food. Try to only fix what you are going to eat, leaving yourself no chance for seconds. If you have prepared more food than you need, portion it into individual containers and freeze or refrigerate immediately. Don't snack while cooking meals.  Eating: Eat slowly. Remember it takes about 20  minutes for your stomach to send a message to your brain that it is full. Don't let fake hunger make you think you need more. The ideal way to eat is to take a bite, put your utensil down, take a sip of water, cut your next bite, take a bit, put your utensil down and so on. Do not cut your food all at one time. Cut only as needed. Take small bites and chew your food well. Stop eating for a minute or two at least once during a meal or snack. Take breaks to reflect and have conversation.  Cleanup and Leftovers: Label leftovers for a specific meal or snack. Freeze or refrigerate individual portions of leftovers. Do not clean up if you are still hungry.  Eating Out and Social Eating  Do not arrive hungry. Eat something light before the meal. Try to fill up on low-calorie foods, such as vegetables and fruit, and eat smaller portions of the high-calorie foods. Eat foods that you like, but choose small portions. If you want seconds, wait at least 20 minutes after you have eaten to see if you are actually hungry or if your eyes are bigger than your stomach. Limit alcoholic beverages. Try a  soda water with a twist of lime. Do not skip other meals in the day to save room for the special event.  At Restaurants: Order  la carte rather than buffet style. Order some vegetables or a salad for an appetizer instead of eating bread. If you order a high-calorie dish, share it with someone. Try an after-dinner mint with your coffee. If you do have dessert, share it with two or more people. Don't overeat because you do not want to waste food. Ask for a doggie bag to take extra food home. Tell the server to put half of your entree in a to go bag before the meal is served to you. Ask for salad dressing, gravy or high-fat sauces on the side. Dip the tip of your fork in the dressing before each bite. If bread is served, ask for only one piece. Try it plain without butter or oil. At [employer] where oil  and vinegar is served with bread, use only a small amount of oil and a lot of vinegar for dipping.  At a [facility]: Offer to bring a dish, appetizer or dessert that is low in calories. Serve yourself small portions or tell the host that you only want a small amount. Stand or sit away from the snack table. Stay away from the kitchen or stay busy if you are near the food. Limit your alcohol intake.  At [employer] and Cafeterias: Cover most of your plate with lettuce and/or vegetables. Use a salad plate instead of a dinner plate. After eating, clear away your dishes before having coffee or tea.  Entertaining at Home: Explore low-fat, low-cholesterol cookbooks. Use single-serving foods like chicken breasts or hamburger patties. Prepare low-calorie appetizers and desserts.   Holidays: Keep tempting foods out of sight. Decorate the house without using food. Have low-calorie beverages and foods on hand for guests. Allow yourself one planned treat a day. Don't skip meals to save up for the holiday feast. Eat regular, planned meals.   Exercise Well  Make exercise a priority and a planned activity in the day. If possible, walk the entire or part of the distance to work. Get an exercise buddy. Go for a walk with a colleague during one of your breaks, go to the gym, run or take a walk with a friend, walk in the mall with a shopping companion. Park at the end of the parking lot and walk to the store or office entrance. Always take the stairs all of the way or at least part of the way to your floor. If you have a desk job, walk around the office frequently. Do leg lifts while sitting at your desk. Do something outside on the weekends like going for a hike or a bike ride.   Have a Healthy Attitude  Make health your weight management priority. Be realistic. Have a goal to achieve a healthier you, not necessarily the lowest weight or ideal weight based on calculations or tables. Focus on  a healthy eating style, not on dieting. Dieting usually lasts for a short amount of time and rarely produces long-term success. Think long term. You are developing new healthy behaviors to follow next month, in a year and in a decade.    This information is for educational purposes only and is not intended to replace the advice of your doctor or health care provider. We encourage you to discuss with your doctor any questions or concerns you may have.  Guidelines for Losing Weight   We want weight loss that will last so you should lose 1-2 pounds a week.  THAT IS IT! Please pick THREE things a month to change. Once it is a habit check off the item. Then pick another three items off the list to become habits.  If you are already doing a habit on the list GREAT!  Cross that item off!  Don't drink your calories. Ie, alcohol, soda, fruit juice, and sweet tea.   Drink more water. Drink a glass when you feel hungry or before each meal.   Eat breakfast - Complex carb and protein (likeDannon light and fit yogurt, oatmeal, fruit, eggs, [place] bacon).  Measure your cereal.  Eat no more than one cup a day. (ie Kashi)  Eat an apple a day.  Add a vegetable a day.  Try a new vegetable a month.  Use Pam! Stop using oil or butter to cook.  Don't finish your plate or use smaller plates.  Share your dessert.  Eat sugar free Jello for dessert or frozen grapes.  Don't eat 2-3 hours before bed.  Switch to whole wheat bread, pasta, and brown rice.  Make healthier choices when you eat out. No fries!  Pick baked chicken, NOT fried.  Don't forget to SLOW DOWN when you eat. It is not going anywhere.   Take the stairs.  Park far away in the parking lot  Lift soup cans (or weights) for 10 minutes while watching TV.  Walk at work for 10 minutes during break.  Walk outside 1 time a week with your friend, kids, dog, or significant other.  Start a walking group at church.  Walk the  mall as much as you can tolerate.   Keep a food diary.  Weigh yourself daily.  Walk for 15 minutes 3 days per week.  Cook at home more often and eat out less. If life happens and you go back to old habits, it is okay.  Just start over. You can do it!  If you experience chest pain, get short of breath, or tired during the exercise, please stop immediately and inform your doctor.    Before you even begin to attack a weight-loss plan, it pays to remember this: You are not fat. You have fat. Losing weight isn't about blame or shame; it's simply another achievement to accomplish. Dieting is like any other skill-you have to buckle down and work at it. As long as you act in a smart, reasonable way, you'll ultimately get where you want to be. Here are some weight loss pearls for you.   1. It's Not a Diet. It's a Lifestyle Thinking of a diet as something you're on and suffering through only for the short term doesn't work. To shed weight and keep it off, you need to make permanent changes to the way you eat. It's OK to indulge occasionally, of course, but if you cut calories temporarily and then revert to your old way of eating, you'll gain back the weight quicker than you can say yo-yo. Use it to lose it. Research shows that one of the best predictors of long-term weight loss is how many pounds you drop in the first month. For that reason, nutritionists often suggest being stricter for the first two weeks of your new eating strategy to build momentum. Cut out added sugar and alcohol and avoid unrefined carbs. After that, figure out how you can reincorporate them in a way that's healthy and  maintainable.  2. There's a Right Way to Exercise Working out burns calories and fat and boosts your metabolism by building muscle. But those trying to lose weight are notorious for overestimating the number of calories they burn and underestimating the amount they take in. Unfortunately, your system is biologically  programmed to hold on to extra pounds and that means when you start exercising, your body senses the deficit and ramps up its hunger signals. If you're not diligent, you'll eat everything you burn and then some. Use it, to lose it. Cardio gets all the exercise glory, but strength and interval training are the real heroes. They help you build lean muscle, which in turn increases your metabolism and calorie-burning ability 3. Don't Overreact to Mild Hunger Some people have a hard time losing weight because of hunger anxiety. To them, being hungry is bad-something to be avoided at all costs-so they carry snacks with them and eat when they don't need to. Others eat because they're stressed out or bored. While you never want to get to the point of being ravenous (that's when bingeing is likely to happen), a hunger pang, a craving, or the fact that it's 3:00 p.m. should not send you racing for the vending machine or obsessing about the energy bar in your purse. Ideally, you should put off eating until your stomach is growling and it's difficult to concentrate.  Use it to lose it. When you feel the urge to eat, use the HALT method. Ask yourself, Am I really hungry? Or am I angry or anxious, lonely or bored, or tired? If you're still not certain, try the apple test. If you're truly hungry, an apple should seem delicious; if it doesn't, something else is going on. Or you can try drinking water and making yourself busy, if you are still hungry try a healthy snack.  4. Not All Calories Are Created Equal The mechanics of weight loss are pretty simple: Take in fewer calories than you use for energy. But the kind of food you eat makes all the difference. Processed food that's high in saturated fat and refined starch or sugar can cause inflammation that disrupts the hormone signals that tell your brain you're full. The result: You eat a lot more.  Use it to lose it. Clean up your diet. Swap in whole, unprocessed foods,  including vegetables, lean protein, and healthy fats that will fill you up and give you the biggest nutritional bang for your calorie buck. In a few weeks, as your brain starts receiving regular hunger and fullness signals once again, you'll notice that you feel less hungry overall and naturally start cutting back on the amount you eat.  5. Protein, Produce, and Plant-Based Fats Are Your Weight-Loss Trinity Here's why eating the three Ps regularly will help you drop pounds. Protein fills you up. You need it to build lean muscle, which keeps your metabolism humming so that you can torch more fat. People in a weight-loss program who ate double the recommended daily allowance for protein (about 110 grams for a 150-pound woman) lost 70 percent of their weight from fat, while people who ate the RDA lost only about 40 percent, one study found. Produce is packed with filling fiber. "It's very difficult to consume too many calories if you're eating a lot of vegetables. Example: Three cups of broccoli is a lot of food, yet only 93 calories. (Fruit is another story. It can be easy to overeat and can contain a lot of calories  from sugar, so be sure to monitor your intake.) Plant-based fats like olive oil and those in avocados and nuts are healthy and extra satiating.  Use it to lose it. Aim to incorporate each of the three Ps into every meal and snack. People who eat protein throughout the day are able to keep weight off, according to a study in the [place] of Clinical Nutrition. In addition to meat, poultry and seafood, good sources are beans, lentils, eggs, tofu, and yogurt. As for fat, keep portion sizes in check by measuring out salad dressing, oil, and nut butters (shoot for one to two tablespoons). Finally, eat veggies or a little fruit at every meal. People who did that consumed 308 fewer calories but didn't feel any hungrier than when they didn't eat more produce.  7. How You Eat Is As Important As  What You Eat In order for your brain to register that you're full, you need to focus on what you're eating. Sit down whenever you eat, preferably at a table. Turn off the TV or computer, put down your phone, and look at your food. Smell it. Chew slowly, and don't put another bite on your fork until you swallow. When women ate lunch this attentively, they consumed 30 percent less when snacking later than those who listened to an audiobook at lunchtime, according to a study in the [place] of Nutrition. 8. Weighing Yourself Really Works The scale provides the best evidence about whether your efforts are paying off. Seeing the numbers tick up or down or stagnate is motivation to keep going-or to rethink your approach. A 2015 study at [facility] found that daily weigh-ins helped people lose more weight, keep it off, and maintain that loss, even after two years. Use it to lose it. Step on the scale at the same time every day for the best results. If your weight shoots up several pounds from one weigh-in to the next, don't freak out. Eating a lot of salt the night before or having your period is the likely culprit. The number should return to normal in a day or two. It's a steady climb that you need to do something about. 9. Too Much Stress and Too Little Sleep Are Your Enemies When you're tired and frazzled, your body cranks up the production of cortisol, the stress hormone that can cause carb cravings. Not getting enough sleep also boosts your levels of ghrelin, a hormone associated with hunger, while suppressing leptin, a hormone that signals fullness and satiety. People on a diet who slept only five and a half hours a night for two weeks lost 55 percent less fat and were hungrier than those who slept eight and a half hours, according to a study in the [place]. Use it to lose it. Prioritize sleep, aiming for seven hours or more a night, which research shows helps lower  stress. And make sure you're getting quality zzz's. If a snoring spouse or a fidgety cat wakes you up frequently throughout the night, you may end up getting the equivalent of just four hours of sleep, according to a study from [facility]. Keep pets out of the bedroom, and use a white-noise app to drown out snoring. 10. You Will Hit a plateau-And You Can Bust Through It As you slim down, your body releases much less leptin, the fullness hormone.  If you're not strength training, start right now. Building muscle can raise your metabolism to help you overcome a  plateau. To keep your body challenged and burning calories, incorporate new moves and more intense intervals into your workouts or add another sweat session to your weekly routine. Alternatively, cut an extra 100 calories or so a day from your diet. Now that you've lost weight, your body simply doesn't need as much fuel.    Since food equals calories, in order to lose weight you must either eat fewer calories, exercise more to burn off calories with activity, or both. Food that is not used to fuel the body is stored as fat. A major component of losing weight is to make smarter food choices. Here's how:  1)   Limit non-nutritious foods, such as: Sugar, honey, syrups and candy Pastries, donuts, pies, cakes and cookies Soft drinks, sweetened juices and alcoholic beverages  2)  Cut down on high-fat foods by: - Choosing poultry, fish or lean red meat - Choosing low-fat cooking methods, such as baking, broiling, steaming, grilling and boiling - Using low-fat or non-fat dairy products - Using vinaigrette, herbs, lemon or fat-free salad dressings - Avoiding fatty meats, such as bacon, sausage, franks, ribs and luncheon meats - Avoiding high-fat snacks like nuts, chips and chocolate - Avoiding fried foods - Using less butter, margarine, oil and mayonnaise - Avoiding high-fat gravies, cream sauces and cream-based soups  3) Eat a variety  of foods, including: - Fruit and vegetables that are raw, steamed or baked - Whole grains, breads, cereal, rice and pasta - Dairy products, such as low-fat or non-fat milk or yogurt, low-fat cottage cheese and low-fat cheese - Protein-rich foods like chicken, [place], fish, lean meat and legumes, or beans  4) Change your eating habits by: - Eat three balanced meals a day to help control your hunger - Watch portion sizes and eat small servings of a variety of foods - Choose low-calorie snacks - Eat only when you are hungry and stop when you are satisfied - Eat slowly and try not to perform other tasks while eating - Find other activities to distract you from food, such as walking, taking up a hobby or being involved in the community - Include regular exercise in your daily routine ( minimum of 20 min of moderate-intensity exercise at least 5 days/week)  - Find a support group, if necessary, for emotional support in your weight loss journey           Easy ways to cut 100 calories   1. Eat your eggs with hot sauce OR salsa instead of cheese.  Eggs are great for breakfast, but many people consider eggs and cheese to be BFFs. Instead of cheese-1 oz. of cheddar has 114 calories-top your eggs with hot sauce, which contains no calories and helps with satiety and metabolism. Salsa is also a great option!!  2. Top your toast, waffles or pancakes with fresh berries instead of jelly or syrup. Half a cup of berries-fresh, frozen or thawed-has about 40 calories, compared with 2 tbsp. of maple syrup or jelly, which both have about 100 calories. The berries will also give you a good punch of fiber, which helps keep you full and satisfied and won't spike blood sugar quickly like the jelly or syrup. 3. Swap the non-fat latte for black coffee with a splash of half-and-half. Contrary to its name, that non-fat latte has 130 calories and a startling 19g of carbohydrates per 16 oz. serving. Replacing that  'light' drinkable dessert with a black coffee with a splash of half-and-half saves you more than  100 calories per 16 oz. serving. 4. Sprinkle salads with freeze-dried raspberries instead of dried cranberries. If you want a sweet addition to your nutritious salad, stay away from dried cranberries. They have a whopping 130 calories per  cup and 30g carbohydrates. Instead, sprinkle freeze-dried raspberries guilt-free and save more than 100 calories per  cup serving, adding 3g of belly-filling fiber. 5. Go for mustard in place of mayo on your sandwich. Mustard can add really nice flavor to any sandwich, and there are tons of varieties, from spicy to honey. A serving of mayo is 95 calories, versus 10 calories in a serving of mustard.  Or try an avocado mayo spread: You can find the recipe few click this link: [email or web address]-container/[email or web address] 6. Choose a DIY salad dressing instead of the store-bought kind. Mix Dijon or whole grain mustard with low-fat Kefir or red wine vinegar and garlic. 7. Use hummus as a spread instead of a dip. Use hummus as a spread on a high-fiber cracker or tortilla with a sandwich and save on calories without sacrificing taste. 8. Pick just one salad "accessory." Salad isn't automatically a calorie winner. It's easy to over-accessorize with toppings. Instead of topping your salad with nuts, avocado and cranberries (all three will clock in at 313 calories), just pick one. The next day, choose a different accessory, which will also keep your salad interesting. You don't wear all your jewelry every day, right? 9. Ditch the white pasta in favor of spaghetti squash. One cup of cooked spaghetti squash has about 40 calories, compared with traditional spaghetti, which comes with more than 200. Spaghetti squash is also nutrient-dense. It's a good source of fiber and Vitamins A and C, and it can be eaten just like you would eat pasta-with a  great tomato sauce and [place] meatballs or with pesto, tofu and spinach, for example. 10. Dress up your chili, soups and stews with non-fat [place] yogurt instead of sour cream. Just a 'dollop' of sour cream can set you back 115 calories and a whopping 12g of fat-seven of which are of the artery-clogging variety. Added bonus: [place] yogurt is packed with muscle-building protein, calcium and B Vitamins. 11. Mash cauliflower instead of mashed potatoes. One cup of traditional mashed potatoes-in all their creamy goodness-has more than 200 calories, compared to mashed cauliflower, which you can typically eat for less than 100 calories per 1 cup serving. Cauliflower is a great source of the antioxidant indole-3-carbinol (I3C), which may help reduce the risk of some cancers, like breast cancer. 12. Ditch the ice cream sundae in favor of a [place] yogurt parfait. Instead of a cup of ice cream or fro-yo for dessert, try 1 cup of nonfat Greek yogurt topped with fresh berries and a sprinkle of cacao nibs. Both toppings are packed with antioxidants, which can help reduce cellular inflammation and oxidative damage. And the comparison is a no-brainer: One cup of ice cream has about 275 calories; one cup of frozen yogurt has about 230; and a cup of Greek yogurt has just 130, plus twice the protein, so you're less likely to return to the freezer for a second helping. 13. Put olive oil in a spray container instead of using it directly from the bottle. Each tablespoon of olive oil is 120 calories and 15g of fat. Use a mister instead of pouring it straight into the pan or onto a salad. This allows for portion control and will save you more than 100 calories. 14. When baking, substitute canned pumpkin for  butter or oil. Canned pumpkin-not pumpkin pie mix-is loaded with Vitamin A, which is important for skin and eye health, as well as immunity. And the comparisons are pretty crazy:  cup of canned pumpkin has about 40 calories,  compared to butter or oil, which has more than 800 calories. Yes, 800 calories. Applesauce and mashed banana can also serve as good substitutions for butter or oil, usually in a 1:1 ratio. 15. Top casseroles with high-fiber cereal instead of breadcrumbs. Breadcrumbs are typically made with white bread, while breakfast cereals contain 5-9g of fiber per serving. Not only will you save more than 150 calories per  cup serving, the swap will also keep you more full and you'll get a metabolism boost from the added fiber. 16. Snack on pistachios instead of macadamia nuts. Believe it or not, you get the same amount of calories from 35 pistachios (100 calories) as you would from only five macadamia nuts. 17. Chow down on kale chips rather than potato chips. This is my favorite 'don't knock it 'till you try it' swap. Kale chips are so easy to make at home, and you can spice them up with a little grated parmesan or chili powder. Plus, they're a mere fraction of the calories of potato chips, but with the same crunch factor we crave so often. 18. Add seltzer and some fruit slices to your cocktail instead of soda or fruit juice. One cup of soda or fruit juice can pack on as much as 140 calories. Instead, use seltzer and fruit slices. The fruit provides valuable phytochemicals, such as flavonoids and anthocyanins, which help to combat cancer and stave off the aging process.

## 2016-08-31 ENCOUNTER — Other Ambulatory Visit: Payer: Self-pay | Admitting: Family Medicine

## 2016-08-31 DIAGNOSIS — F4323 Adjustment disorder with mixed anxiety and depressed mood: Secondary | ICD-10-CM

## 2016-08-31 MED ORDER — BUPROPION HCL ER (XL) 150 MG PO TB24: 150.0000 mg | ORAL_TABLET | Freq: Every day | ORAL | 0 refills | Status: DC

## 2016-08-31 NOTE — Telephone Encounter (Signed)
Pt called states she thought she had enough Wellbutin XL 150 MG 24hr tablets to get her through till mailorder arrived but took her last one today---Pt request Provider call at least 15 into the [place] [facility] for her - Please call her back to confirm if this is possible. --glh

## 2016-08-31 NOTE — Telephone Encounter (Signed)
Wellbutrin rx sent to [facility].  Patient informed.

## 2016-09-26 ENCOUNTER — Telehealth: Payer: Self-pay | Admitting: Family Medicine

## 2016-09-26 NOTE — Telephone Encounter (Signed)
Patient called states has taken last Wellbutrin tablet & called [facility] for refill (which they give 30 day not 90 day supply --Pt request 90days--frwding note to MA . --glh

## 2016-09-26 NOTE — Telephone Encounter (Signed)
Patient needs 90 day supply sent to [facility] mail order.  MPulliam, CMA/RT(R)

## 2016-09-27 ENCOUNTER — Other Ambulatory Visit: Payer: Self-pay

## 2016-09-27 DIAGNOSIS — F321 Major depressive disorder, single episode, moderate: Secondary | ICD-10-CM

## 2016-09-27 DIAGNOSIS — F4323 Adjustment disorder with mixed anxiety and depressed mood: Secondary | ICD-10-CM

## 2016-09-27 DIAGNOSIS — F411 Generalized anxiety disorder: Secondary | ICD-10-CM

## 2016-09-27 MED ORDER — BUPROPION HCL ER (XL) 150 MG PO TB24: 150.0000 mg | ORAL_TABLET | Freq: Every day | ORAL | 0 refills | Status: DC

## 2016-09-27 NOTE — Telephone Encounter (Signed)
Per Dr Raliegh Scarlet sent in 90 day supply to [facility] mail order.  MPulliam, CMA/RT(R)

## 2016-09-27 NOTE — Telephone Encounter (Signed)
Patient called requesting 90 day supply of Wellbutrin be sent to [facility] - spoke with Dr Raliegh Scarlet for approval.  Dian Situ in - patient notified.  MPulliam, CMA/RT(R)

## 2016-12-12 ENCOUNTER — Ambulatory Visit (INDEPENDENT_AMBULATORY_CARE_PROVIDER_SITE_OTHER): Payer: Medicare HMO | Admitting: Family Medicine

## 2016-12-12 ENCOUNTER — Telehealth: Payer: Self-pay | Admitting: Family Medicine

## 2016-12-12 ENCOUNTER — Encounter: Payer: Self-pay | Admitting: Family Medicine

## 2016-12-12 VITALS — BP 127/87 | HR 84 | Ht 65.0 in | Wt 181.0 lb

## 2016-12-12 DIAGNOSIS — J0100 Acute maxillary sinusitis, unspecified: Secondary | ICD-10-CM

## 2016-12-12 DIAGNOSIS — H6592 Unspecified nonsuppurative otitis media, left ear: Secondary | ICD-10-CM | POA: Diagnosis not present

## 2016-12-12 MED ORDER — AMOXICILLIN-POT CLAVULANATE 875-125 MG PO TABS: 1.0000 | ORAL_TABLET | Freq: Two times a day (BID) | ORAL | 0 refills | Status: DC

## 2016-12-12 MED ORDER — HYDROCOD POLST-CPM POLST ER 10-8 MG/5ML PO SUER
5.0000 mL | Freq: Two times a day (BID) | ORAL | 0 refills | Status: DC | PRN
Start: 2016-12-12 — End: 2016-12-30

## 2016-12-12 MED ORDER — AMOXICILLIN-POT CLAVULANATE 875-125 MG PO TABS: 1.0000 | ORAL_TABLET | Freq: Two times a day (BID) | ORAL | 0 refills | Status: AC

## 2016-12-12 NOTE — Patient Instructions (Addendum)
Rx Saver or GoodRx are 2 online prescriptions saving programs which you should please look into to see if you can save money on your prescriptions.   Please use sinus rinses twice daily followed by Flonase 1 spray each nostril twice daily.  Milta Deiters med sinus rinse or [place] or the like is what we recommend  Since she's had tussionex in the past and done well with it, please see the prescription we gave you separately to fill this cough medicine.

## 2016-12-12 NOTE — Telephone Encounter (Signed)
Patient wanted to keep clinic staff aware that [place] only filled 50 of the 200 cough med Dr. Jenetta Downer wrote. She said that if she still having a bad cough and runs out and may need a refill this is why.

## 2016-12-12 NOTE — Progress Notes (Signed)
Acute Care Office visit  Assessment and plan:  1. Left otitis media with effusion   2. Acute maxillary sinusitis, recurrence not specified    --> abx now, and supportive care d/c pt.  - Since can;'t sleep at night due to cough- 12hr cough medicine script givne to pt after R/B meds d/c her.  If too expensive, she will use OTC cough meds.  - fluids, rest, sinus rinses followed by flonase.  - pt declines steroids due to wt gain and potential s-e  Pt was in the office today for 25+ minutes, with over 50% time spent in face to face counseling of patient's various medical conditions and in coordination of care Meds ordered this encounter  Medications  . DISCONTD: amoxicillin-clavulanate (AUGMENTIN) 875-125 MG tablet    Sig: Take 1 tablet by mouth 2 (two) times daily.    Dispense:  20 tablet    Refill:  0  . chlorpheniramine-HYDROcodone (TUSSIONEX) 10-8 MG/5ML SUER    Sig: Take 5 mLs by mouth every 12 (twelve) hours as needed for cough (cough, will cause drowsiness.).    Dispense:  200 mL    Refill:  0  . amoxicillin-clavulanate (AUGMENTIN) 875-125 MG tablet    Sig: Take 1 tablet by mouth 2 (two) times daily.    Dispense:  20 tablet    Refill:  0   Anticipatory guidance and routine counseling done re: condition, txmnt options and need for follow up. All questions of patient's were answered.  - Viral vs Allergic vs Bacterial causes for pt's symptoms reveiwed.    - Supportive care and various OTC medications discussed in addition to any prescribed. - Call or RTC if new symptoms, or if no improvement or worse over next couple days.     Meds ordered this encounter  Medications  . DISCONTD: amoxicillin-clavulanate (AUGMENTIN) 875-125 MG tablet    Sig: Take 1 tablet by mouth 2 (two) times daily.    Dispense:  20 tablet    Refill:  0  . chlorpheniramine-HYDROcodone (TUSSIONEX) 10-8 MG/5ML SUER    Sig: Take 5 mLs by mouth every 12 (twelve) hours as needed for cough (cough, will cause  drowsiness.).    Dispense:  200 mL    Refill:  0  . amoxicillin-clavulanate (AUGMENTIN) 875-125 MG tablet    Sig: Take 1 tablet by mouth 2 (two) times daily.    Dispense:  20 tablet    Refill:  0    Gross side effects, risk and benefits, and alternatives of medications discussed with patient.  Patient is aware that all medications have potential side effects and we are unable to predict every sideeffect or drug-drug interaction that may occur.  Expresses verbal understanding and consents to current therapy plan and treatment regiment.  Return if symptoms worsen or fail to improve.  Please see AVS handed out to patient at the end of our visit for additional patient instructions/ counseling done pertaining to today's office visit.  Note: This document was prepared using Dragon voice recognition software and may include unintentional dictation errors.    Subjective:    Chief Complaint  Patient presents with  . Facial Pain    sinus/facial pain and pressure, post nasal drainage, productive cough, HA, Lt earache x 3 days   HPI:  Pt presents with Sx for 3 days   C/o:  RN, Sinus congestion, facial pain and HA- B/l, L sided ear pain, prod cough, SOB no pleurtic CP with cough/ breathing  Denies:  Subjective chills, no objective F,  No N/V/D,   No wh/DIB, no Cough or  Pleuritic CP,  No Rash.    For symptoms patient has tried:  Robitussin DM, benadryl, mucinex.   Overall getting:   Worse than when started.   No cigarettes in over 20+ years.   Past medical history, Surgical history, Family history reviewed and noted below, Social history, Allergies, and Medications have been entered into the medical record, reviewed and changed as needed.   Allergies  Allergen Reactions  . Bactrim [Sulfamethoxazole-Trimethoprim] Shortness Of Breath, Anxiety and Palpitations  . Valsartan Other (See Comments) and Cough    Headaches also   . Bystolic [Nebivolol Hcl] Other (See Comments)    Fatigue-  Tired feeling  . Isosorbide Other (See Comments)    HEADACHES   . Levofloxacin Hives  . Lisinopril Cough  . Metoprolol Other (See Comments)    FATIGUE  . Morphine And Related Other (See Comments)    headache  . Simvastatin Other (See Comments)    Makes legs hurt/ sore   . Statins Other (See Comments)    MYALGIAS  . Tetracycline Hives  . Toradol [Ketorolac Tromethamine] Palpitations    Review of Systems: General:   No F/C, wt loss Pulm:   No DIB, pleuritic chest pain Card:  No CP, palpitations Abd:  No n/v/d or pain Ext:  No inc edema from baseline   Objective:   Blood pressure 127/87, pulse 84, height 5\' 5"  (1.651 m), weight 181 lb (82.1 kg). Body mass index is 30.12 kg/m. General: Well Developed, well nourished, appropriate for stated age.  Neuro: Alert and oriented x3, extra-ocular muscles intact, sensation grossly intact.  HEENT: Normocephalic, atraumatic, pupils equal round reactive to light, neck supple, no masses, no painful lymphadenopathy, TM's intact B/L- left TM with opacity\whitest effusion and slight bulge.  Mild your Seema.  Decrease movement.  Positive tender to palpation sinuses especially left frontal and maxillary, no acute findings. Nares- patent, clear d/c, OP- clear, mild erythema Skin: Warm and dry, no gross rash. Cardiac: RRR, S1 S2,  no murmurs rubs or gallops.  Respiratory: ECTA B/L and A/P , no wheezes rales or rhonchi, Not using accessory muscles, speaking in full sentences- unlabored. Vascular:  No gross lower ext edema, cap RF less 2 sec. Psych: No HI/SI, judgement and insight good, Euthymic mood. Full Affect.   Patient Care Team    Relationship Specialty Notifications Start End  Mellody Dance, DO PCP - General Family Medicine  07/05/16   Lelon Perla, MD Consulting Physician Cardiology  05/10/16   Deneise Lever, MD Consulting Physician Pulmonary Disease  05/10/16   Roseanne Kaufman, MD Consulting Physician Orthopedic Surgery  05/10/16     Garlan Fair, MD Consulting Physician Gastroenterology  05/10/16

## 2016-12-13 NOTE — Telephone Encounter (Signed)
Noted and informed Dr. Opalski. MPulliam, CMA/RT(R)  

## 2016-12-21 ENCOUNTER — Other Ambulatory Visit: Payer: Self-pay | Admitting: Family Medicine

## 2016-12-21 ENCOUNTER — Other Ambulatory Visit: Payer: Self-pay | Admitting: Cardiology

## 2016-12-21 DIAGNOSIS — F4323 Adjustment disorder with mixed anxiety and depressed mood: Secondary | ICD-10-CM

## 2016-12-22 NOTE — Telephone Encounter (Signed)
REFILL 

## 2016-12-30 ENCOUNTER — Encounter: Payer: Self-pay | Admitting: Family Medicine

## 2016-12-30 ENCOUNTER — Ambulatory Visit (INDEPENDENT_AMBULATORY_CARE_PROVIDER_SITE_OTHER): Payer: Medicare HMO | Admitting: Family Medicine

## 2016-12-30 VITALS — BP 123/87 | HR 83 | Ht 65.0 in | Wt 182.3 lb

## 2016-12-30 DIAGNOSIS — Z90710 Acquired absence of both cervix and uterus: Secondary | ICD-10-CM | POA: Insufficient documentation

## 2016-12-30 DIAGNOSIS — I2510 Atherosclerotic heart disease of native coronary artery without angina pectoris: Secondary | ICD-10-CM

## 2016-12-30 DIAGNOSIS — Z90722 Acquired absence of ovaries, bilateral: Secondary | ICD-10-CM

## 2016-12-30 DIAGNOSIS — Z9079 Acquired absence of other genital organ(s): Secondary | ICD-10-CM

## 2016-12-30 DIAGNOSIS — E782 Mixed hyperlipidemia: Secondary | ICD-10-CM

## 2016-12-30 DIAGNOSIS — J449 Chronic obstructive pulmonary disease, unspecified: Secondary | ICD-10-CM

## 2016-12-30 DIAGNOSIS — F5105 Insomnia due to other mental disorder: Secondary | ICD-10-CM

## 2016-12-30 DIAGNOSIS — F4323 Adjustment disorder with mixed anxiety and depressed mood: Secondary | ICD-10-CM

## 2016-12-30 DIAGNOSIS — F419 Anxiety disorder, unspecified: Secondary | ICD-10-CM

## 2016-12-30 DIAGNOSIS — K219 Gastro-esophageal reflux disease without esophagitis: Secondary | ICD-10-CM

## 2016-12-30 DIAGNOSIS — Z23 Encounter for immunization: Secondary | ICD-10-CM

## 2016-12-30 MED ORDER — VITAMIN D (CHOLECALCIFEROL) 25 MCG (1000 UT) PO CAPS: 5000.0000 [IU] | ORAL_CAPSULE | Freq: Every day | ORAL | Status: DC

## 2016-12-30 MED ORDER — PANTOPRAZOLE SODIUM 20 MG PO TBEC: 40.0000 mg | DELAYED_RELEASE_TABLET | Freq: Every day | ORAL | 3 refills | Status: DC

## 2016-12-30 NOTE — Patient Instructions (Addendum)
We discussed things she can do to help prevent dementia to include healthy diet, good sleep, managing her stress, using her brain to do activities such as learning a new language or a new instrument as well as reading doing brain activities daily, exercising, high antioxidant diet etc.  In terms of how much to exercise at least 30 minutes 5 days a week is minimum your maximum heart rate is 161.  So if we calculate you working in the 70-80% of your max heart rate which is best to burn fat and good for your mind, your heart rate while exercising should run 112-128!  Told patient that Prevagen is not useful and I do not recommend based on inadequate evidence that it can improve memory and brain function as well component and it can actually be deleterious to your health.   Behavior Modification Ideas for Weight Management Weight management involves adopting a healthy lifestyle that includes a knowledge of nutrition and exercise, a positive attitude and the right kind of motivation. Internal motives such as better health, increased energy, self-esteem and personal control increase your chances of lifelong weight management success.  Remember to have realistic goals and think long-term success. Believe in yourself and you can do it. The following information will give you ideas to help you meet your goals.  Control Your Home Environment  Eat only while sitting down at the kitchen or dining room table. Do not eat while watching television, reading, cooking, talking on the phone, standing at the refrigerator or working on the computer. Keep tempting foods out of the house - don't buy them. Keep tempting foods out of sight. Have low-calorie foods ready to eat. Unless you are preparing a meal, stay out of the kitchen. Have healthy snacks at your disposal, such as small pieces of fruit, vegetables, canned fruit, pretzels, low-fat string cheese and nonfat cottage cheese.  Control Your Work Environment  Do  not eat at [employer] or keep tempting snacks at your desk. If you get hungry between meals, plan healthy snacks and bring them with you to work. During your breaks, go for a walk instead of eating. If you work around food, plan in advance the one item you will eat at mealtime. Make it inconvenient to nibble on food by chewing gum, sugarless candy or drinking water or another low-calorie beverage. Do not work through meals. Skipping meals slows down metabolism and may result in overeating at the next meal. If food is available for special occasions, either pick the healthiest item, nibble on low-fat snacks brought from home, don't have anything offered, choose one option and have a small amount, or have only a beverage.  Control Your Mealtime Environment  Serve your plate of food at the stove or kitchen counter. Do not put the serving dishes on the table. If you do put dishes on the table, remove them immediately when finished eating. Fill half of your plate with vegetables, a quarter with lean protein and a quarter with starch. Use smaller plates, bowls and glasses. A smaller portion will look large when it is in a little dish. Politely refuse second helpings. When fixing your plate, limit portions of food to one scoop/serving or less.   Daily Food Management  Replace eating with another activity that you will not associate with food. Wait 20 minutes before eating something you are craving. Drink a large glass of water or diet soda before eating. Always have a big glass or bottle of water to drink  throughout the day. Avoid high-calorie add-ons such as cream with your coffee, butter, mayonnaise and salad dressings.  Shopping: Do not shop when hungry or tired. Shop from a list and avoid buying anything that is not on your list. If you must have tempting foods, buy individual-sized packages and try to find a lower-calorie alternative. Don't taste test in the store. Read food labels.  Compare products to help you make the healthiest choices.  Preparation: Chew a piece of gum while cooking meals. Use a quarter teaspoon if you taste test your food. Try to only fix what you are going to eat, leaving yourself no chance for seconds. If you have prepared more food than you need, portion it into individual containers and freeze or refrigerate immediately. Don't snack while cooking meals.  Eating: Eat slowly. Remember it takes about 20 minutes for your stomach to send a message to your brain that it is full. Don't let fake hunger make you think you need more. The ideal way to eat is to take a bite, put your utensil down, take a sip of water, cut your next bite, take a bit, put your utensil down and so on. Do not cut your food all at one time. Cut only as needed. Take small bites and chew your food well. Stop eating for a minute or two at least once during a meal or snack. Take breaks to reflect and have conversation.  Cleanup and Leftovers: Label leftovers for a specific meal or snack. Freeze or refrigerate individual portions of leftovers. Do not clean up if you are still hungry.  Eating Out and Social Eating  Do not arrive hungry. Eat something light before the meal. Try to fill up on low-calorie foods, such as vegetables and fruit, and eat smaller portions of the high-calorie foods. Eat foods that you like, but choose small portions. If you want seconds, wait at least 20 minutes after you have eaten to see if you are actually hungry or if your eyes are bigger than your stomach. Limit alcoholic beverages. Try a soda water with a twist of lime. Do not skip other meals in the day to save room for the special event.  At Restaurants: Order  la carte rather than buffet style. Order some vegetables or a salad for an appetizer instead of eating bread. If you order a high-calorie dish, share it with someone. Try an after-dinner mint with your coffee. If you do have dessert,  share it with two or more people. Don't overeat because you do not want to waste food. Ask for a doggie bag to take extra food home. Tell the server to put half of your entree in a to go bag before the meal is served to you. Ask for salad dressing, gravy or high-fat sauces on the side. Dip the tip of your fork in the dressing before each bite. If bread is served, ask for only one piece. Try it plain without butter or oil. At [employer] where oil and vinegar is served with bread, use only a small amount of oil and a lot of vinegar for dipping.  At a [facility]: Offer to bring a dish, appetizer or dessert that is low in calories. Serve yourself small portions or tell the host that you only want a small amount. Stand or sit away from the snack table. Stay away from the kitchen or stay busy if you are near the food. Limit your alcohol intake.  At [employer] and Cafeterias: Cover most  of your plate with lettuce and/or vegetables. Use a salad plate instead of a dinner plate. After eating, clear away your dishes before having coffee or tea.  Entertaining at Home: Explore low-fat, low-cholesterol cookbooks. Use single-serving foods like chicken breasts or hamburger patties. Prepare low-calorie appetizers and desserts.   Holidays: Keep tempting foods out of sight. Decorate the house without using food. Have low-calorie beverages and foods on hand for guests. Allow yourself one planned treat a day. Don't skip meals to save up for the holiday feast. Eat regular, planned meals.   Exercise Well  Make exercise a priority and a planned activity in the day. If possible, walk the entire or part of the distance to work. Get an exercise buddy. Go for a walk with a colleague during one of your breaks, go to the gym, run or take a walk with a friend, walk in the mall with a shopping companion. Park at the end of the parking lot and walk to the store or office entrance. Always take the  stairs all of the way or at least part of the way to your floor. If you have a desk job, walk around the office frequently. Do leg lifts while sitting at your desk. Do something outside on the weekends like going for a hike or a bike ride.   Have a Healthy Attitude  Make health your weight management priority. Be realistic. Have a goal to achieve a healthier you, not necessarily the lowest weight or ideal weight based on calculations or tables. Focus on a healthy eating style, not on dieting. Dieting usually lasts for a short amount of time and rarely produces long-term success. Think long term. You are developing new healthy behaviors to follow next month, in a year and in a decade.    This information is for educational purposes only and is not intended to replace the advice of your doctor or health care provider. We encourage you to discuss with your doctor any questions or concerns you may have.        Guidelines for Losing Weight   We want weight loss that will last so you should lose 1-2 pounds a week.  THAT IS IT! Please pick THREE things a month to change. Once it is a habit check off the item. Then pick another three items off the list to become habits.  If you are already doing a habit on the list GREAT!  Cross that item off!  Don't drink your calories. Ie, alcohol, soda, fruit juice, and sweet tea.   Drink more water. Drink a glass when you feel hungry or before each meal.   Eat breakfast - Complex carb and protein (likeDannon light and fit yogurt, oatmeal, fruit, eggs, [place] bacon).  Measure your cereal.  Eat no more than one cup a day. (ie Kashi)  Eat an apple a day.  Add a vegetable a day.  Try a new vegetable a month.  Use Pam! Stop using oil or butter to cook.  Don't finish your plate or use smaller plates.  Share your dessert.  Eat sugar free Jello for dessert or frozen grapes.  Don't eat 2-3 hours before bed.  Switch to whole wheat bread, pasta,  and brown rice.  Make healthier choices when you eat out. No fries!  Pick baked chicken, NOT fried.  Don't forget to SLOW DOWN when you eat. It is not going anywhere.   Take the stairs.  Park far away in the parking lot  Lift soup cans (or weights) for 10 minutes while watching TV.  Walk at work for 10 minutes during break.  Walk outside 1 time a week with your friend, kids, dog, or significant other.  Start a walking group at church.  Walk the mall as much as you can tolerate.   Keep a food diary.  Weigh yourself daily.  Walk for 15 minutes 3 days per week.  Cook at home more often and eat out less. If life happens and you go back to old habits, it is okay.  Just start over. You can do it!  If you experience chest pain, get short of breath, or tired during the exercise, please stop immediately and inform your doctor.    Before you even begin to attack a weight-loss plan, it pays to remember this: You are not fat. You have fat. Losing weight isn't about blame or shame; it's simply another achievement to accomplish. Dieting is like any other skill-you have to buckle down and work at it. As long as you act in a smart, reasonable way, you'll ultimately get where you want to be. Here are some weight loss pearls for you.   1. It's Not a Diet. It's a Lifestyle Thinking of a diet as something you're on and suffering through only for the short term doesn't work. To shed weight and keep it off, you need to make permanent changes to the way you eat. It's OK to indulge occasionally, of course, but if you cut calories temporarily and then revert to your old way of eating, you'll gain back the weight quicker than you can say yo-yo. Use it to lose it. Research shows that one of the best predictors of long-term weight loss is how many pounds you drop in the first month. For that reason, nutritionists often suggest being stricter for the first two weeks of your new eating strategy to build  momentum. Cut out added sugar and alcohol and avoid unrefined carbs. After that, figure out how you can reincorporate them in a way that's healthy and maintainable.  2. There's a Right Way to Exercise Working out burns calories and fat and boosts your metabolism by building muscle. But those trying to lose weight are notorious for overestimating the number of calories they burn and underestimating the amount they take in. Unfortunately, your system is biologically programmed to hold on to extra pounds and that means when you start exercising, your body senses the deficit and ramps up its hunger signals. If you're not diligent, you'll eat everything you burn and then some. Use it, to lose it. Cardio gets all the exercise glory, but strength and interval training are the real heroes. They help you build lean muscle, which in turn increases your metabolism and calorie-burning ability 3. Don't Overreact to Mild Hunger Some people have a hard time losing weight because of hunger anxiety. To them, being hungry is bad-something to be avoided at all costs-so they carry snacks with them and eat when they don't need to. Others eat because they're stressed out or bored. While you never want to get to the point of being ravenous (that's when bingeing is likely to happen), a hunger pang, a craving, or the fact that it's 3:00 p.m. should not send you racing for the vending machine or obsessing about the energy bar in your purse. Ideally, you should put off eating until your stomach is growling and it's difficult to concentrate.  Use it to lose it. When you feel  the urge to eat, use the HALT method. Ask yourself, Am I really hungry? Or am I angry or anxious, lonely or bored, or tired? If you're still not certain, try the apple test. If you're truly hungry, an apple should seem delicious; if it doesn't, something else is going on. Or you can try drinking water and making yourself busy, if you are still hungry try a healthy  snack.  4. Not All Calories Are Created Equal The mechanics of weight loss are pretty simple: Take in fewer calories than you use for energy. But the kind of food you eat makes all the difference. Processed food that's high in saturated fat and refined starch or sugar can cause inflammation that disrupts the hormone signals that tell your brain you're full. The result: You eat a lot more.  Use it to lose it. Clean up your diet. Swap in whole, unprocessed foods, including vegetables, lean protein, and healthy fats that will fill you up and give you the biggest nutritional bang for your calorie buck. In a few weeks, as your brain starts receiving regular hunger and fullness signals once again, you'll notice that you feel less hungry overall and naturally start cutting back on the amount you eat.  5. Protein, Produce, and Plant-Based Fats Are Your Weight-Loss Trinity Here's why eating the three Ps regularly will help you drop pounds. Protein fills you up. You need it to build lean muscle, which keeps your metabolism humming so that you can torch more fat. People in a weight-loss program who ate double the recommended daily allowance for protein (about 110 grams for a 150-pound woman) lost 70 percent of their weight from fat, while people who ate the RDA lost only about 40 percent, one study found. Produce is packed with filling fiber. "It's very difficult to consume too many calories if you're eating a lot of vegetables. Example: Three cups of broccoli is a lot of food, yet only 93 calories. (Fruit is another story. It can be easy to overeat and can contain a lot of calories from sugar, so be sure to monitor your intake.) Plant-based fats like olive oil and those in avocados and nuts are healthy and extra satiating.  Use it to lose it. Aim to incorporate each of the three Ps into every meal and snack. People who eat protein throughout the day are able to keep weight off, according to a study in the [place] of Clinical Nutrition. In addition to meat, poultry and seafood, good sources are beans, lentils, eggs, tofu, and yogurt. As for fat, keep portion sizes in check by measuring out salad dressing, oil, and nut butters (shoot for one to two tablespoons). Finally, eat veggies or a little fruit at every meal. People who did that consumed 308 fewer calories but didn't feel any hungrier than when they didn't eat more produce.  7. How You Eat Is As Important As What You Eat In order for your brain to register that you're full, you need to focus on what you're eating. Sit down whenever you eat, preferably at a table. Turn off the TV or computer, put down your phone, and look at your food. Smell it. Chew slowly, and don't put another bite on your fork until you swallow. When women ate lunch this attentively, they consumed 30 percent less when snacking later than those who listened to an audiobook at lunchtime, according to a study in the [place] of Nutrition. 8. Weighing Yourself Really Works The scale  provides the best evidence about whether your efforts are paying off. Seeing the numbers tick up or down or stagnate is motivation to keep going-or to rethink your approach. A 2015 study at [facility] found that daily weigh-ins helped people lose more weight, keep it off, and maintain that loss, even after two years. Use it to lose it. Step on the scale at the same time every day for the best results. If your weight shoots up several pounds from one weigh-in to the next, don't freak out. Eating a lot of salt the night before or having your period is the likely culprit. The number should return to normal in a day or two. It's a steady climb that you need to do something about. 9. Too Much Stress and Too Little Sleep Are Your Enemies When you're tired and frazzled, your body cranks up the production of cortisol, the stress hormone that can cause carb cravings. Not getting enough sleep also boosts  your levels of ghrelin, a hormone associated with hunger, while suppressing leptin, a hormone that signals fullness and satiety. People on a diet who slept only five and a half hours a night for two weeks lost 55 percent less fat and were hungrier than those who slept eight and a half hours, according to a study in the [place]. Use it to lose it. Prioritize sleep, aiming for seven hours or more a night, which research shows helps lower stress. And make sure you're getting quality zzz's. If a snoring spouse or a fidgety cat wakes you up frequently throughout the night, you may end up getting the equivalent of just four hours of sleep, according to a study from [facility]. Keep pets out of the bedroom, and use a white-noise app to drown out snoring. 10. You Will Hit a plateau-And You Can Bust Through It As you slim down, your body releases much less leptin, the fullness hormone.  If you're not strength training, start right now. Building muscle can raise your metabolism to help you overcome a plateau. To keep your body challenged and burning calories, incorporate new moves and more intense intervals into your workouts or add another sweat session to your weekly routine. Alternatively, cut an extra 100 calories or so a day from your diet. Now that you've lost weight, your body simply doesn't need as much fuel.    Since food equals calories, in order to lose weight you must either eat fewer calories, exercise more to burn off calories with activity, or both. Food that is not used to fuel the body is stored as fat. A major component of losing weight is to make smarter food choices. Here's how:  1)   Limit non-nutritious foods, such as: Sugar, honey, syrups and candy Pastries, donuts, pies, cakes and cookies Soft drinks, sweetened juices and alcoholic beverages  2)  Cut down on high-fat foods by: - Choosing poultry, fish or lean red meat - Choosing low-fat cooking  methods, such as baking, broiling, steaming, grilling and boiling - Using low-fat or non-fat dairy products - Using vinaigrette, herbs, lemon or fat-free salad dressings - Avoiding fatty meats, such as bacon, sausage, franks, ribs and luncheon meats - Avoiding high-fat snacks like nuts, chips and chocolate - Avoiding fried foods - Using less butter, margarine, oil and mayonnaise - Avoiding high-fat gravies, cream sauces and cream-based soups  3) Eat a variety of foods, including: - Fruit and vegetables that are raw, steamed or baked - Whole grains,  breads, cereal, rice and pasta - Dairy products, such as low-fat or non-fat milk or yogurt, low-fat cottage cheese and low-fat cheese - Protein-rich foods like chicken, [place], fish, lean meat and legumes, or beans  4) Change your eating habits by: - Eat three balanced meals a day to help control your hunger - Watch portion sizes and eat small servings of a variety of foods - Choose low-calorie snacks - Eat only when you are hungry and stop when you are satisfied - Eat slowly and try not to perform other tasks while eating - Find other activities to distract you from food, such as walking, taking up a hobby or being involved in the community - Include regular exercise in your daily routine ( minimum of 20 min of moderate-intensity exercise at least 5 days/week)  - Find a support group, if necessary, for emotional support in your weight loss journey           Easy ways to cut 100 calories   1. Eat your eggs with hot sauce OR salsa instead of cheese.  Eggs are great for breakfast, but many people consider eggs and cheese to be BFFs. Instead of cheese-1 oz. of cheddar has 114 calories-top your eggs with hot sauce, which contains no calories and helps with satiety and metabolism. Salsa is also a great option!!  2. Top your toast, waffles or pancakes with fresh berries instead of jelly or syrup. Half a cup of berries-fresh, frozen or  thawed-has about 40 calories, compared with 2 tbsp. of maple syrup or jelly, which both have about 100 calories. The berries will also give you a good punch of fiber, which helps keep you full and satisfied and won't spike blood sugar quickly like the jelly or syrup. 3. Swap the non-fat latte for black coffee with a splash of half-and-half. Contrary to its name, that non-fat latte has 130 calories and a startling 19g of carbohydrates per 16 oz. serving. Replacing that 'light' drinkable dessert with a black coffee with a splash of half-and-half saves you more than 100 calories per 16 oz. serving. 4. Sprinkle salads with freeze-dried raspberries instead of dried cranberries. If you want a sweet addition to your nutritious salad, stay away from dried cranberries. They have a whopping 130 calories per  cup and 30g carbohydrates. Instead, sprinkle freeze-dried raspberries guilt-free and save more than 100 calories per  cup serving, adding 3g of belly-filling fiber. 5. Go for mustard in place of mayo on your sandwich. Mustard can add really nice flavor to any sandwich, and there are tons of varieties, from spicy to honey. A serving of mayo is 95 calories, versus 10 calories in a serving of mustard.  Or try an avocado mayo spread: You can find the recipe few click this link: https://www.californiaavocado.com/recipes/recipe-container/[place]-avocado-mayo 6. Choose a DIY salad dressing instead of the store-bought kind. Mix Dijon or whole grain mustard with low-fat Kefir or red wine vinegar and garlic. 7. Use hummus as a spread instead of a dip. Use hummus as a spread on a high-fiber cracker or tortilla with a sandwich and save on calories without sacrificing taste. 8. Pick just one salad "accessory." Salad isn't automatically a calorie winner. It's easy to over-accessorize with toppings. Instead of topping your salad with nuts, avocado and cranberries (all three will clock in at 313 calories), just pick  one. The next day, choose a different accessory, which will also keep your salad interesting. You don't wear all your jewelry every day, right? 9. Ditch  the white pasta in favor of spaghetti squash. One cup of cooked spaghetti squash has about 40 calories, compared with traditional spaghetti, which comes with more than 200. Spaghetti squash is also nutrient-dense. It's a good source of fiber and Vitamins A and C, and it can be eaten just like you would eat pasta-with a great tomato sauce and [place] meatballs or with pesto, tofu and spinach, for example. 10. Dress up your chili, soups and stews with non-fat [place] yogurt instead of sour cream. Just a 'dollop' of sour cream can set you back 115 calories and a whopping 12g of fat-seven of which are of the artery-clogging variety. Added bonus: [place] yogurt is packed with muscle-building protein, calcium and B Vitamins. 11. Mash cauliflower instead of mashed potatoes. One cup of traditional mashed potatoes-in all their creamy goodness-has more than 200 calories, compared to mashed cauliflower, which you can typically eat for less than 100 calories per 1 cup serving. Cauliflower is a great source of the antioxidant indole-3-carbinol (I3C), which may help reduce the risk of some cancers, like breast cancer. 12. Ditch the ice cream sundae in favor of a [place] yogurt parfait. Instead of a cup of ice cream or fro-yo for dessert, try 1 cup of nonfat Greek yogurt topped with fresh berries and a sprinkle of cacao nibs. Both toppings are packed with antioxidants, which can help reduce cellular inflammation and oxidative damage. And the comparison is a no-brainer: One cup of ice cream has about 275 calories; one cup of frozen yogurt has about 230; and a cup of Greek yogurt has just 130, plus twice the protein, so you're less likely to return to the freezer for a second helping. 13. Put olive oil in a spray container instead of using it directly from the bottle. Each  tablespoon of olive oil is 120 calories and 15g of fat. Use a mister instead of pouring it straight into the pan or onto a salad. This allows for portion control and will save you more than 100 calories. 14. When baking, substitute canned pumpkin for butter or oil. Canned pumpkin-not pumpkin pie mix-is loaded with Vitamin A, which is important for skin and eye health, as well as immunity. And the comparisons are pretty crazy:  cup of canned pumpkin has about 40 calories, compared to butter or oil, which has more than 800 calories. Yes, 800 calories. Applesauce and mashed banana can also serve as good substitutions for butter or oil, usually in a 1:1 ratio. 15. Top casseroles with high-fiber cereal instead of breadcrumbs. Breadcrumbs are typically made with white bread, while breakfast cereals contain 5-9g of fiber per serving. Not only will you save more than 150 calories per  cup serving, the swap will also keep you more full and you'll get a metabolism boost from the added fiber. 16. Snack on pistachios instead of macadamia nuts. Believe it or not, you get the same amount of calories from 35 pistachios (100 calories) as you would from only five macadamia nuts. 17. Chow down on kale chips rather than potato chips. This is my favorite 'don't knock it 'till you try it' swap. Kale chips are so easy to make at home, and you can spice them up with a little grated parmesan or chili powder. Plus, they're a mere fraction of the calories of potato chips, but with the same crunch factor we crave so often. 18. Add seltzer and some fruit slices to your cocktail instead of soda or fruit juice. One cup of soda  or fruit juice can pack on as much as 140 calories. Instead, use seltzer and fruit slices. The fruit provides valuable phytochemicals, such as flavonoids and anthocyanins, which help to combat cancer and stave off the aging process.

## 2016-12-30 NOTE — Progress Notes (Signed)
Impression and Recommendations:    1. Adjustment disorder with mixed anxiety and depressed mood   2. S/P total hysterectomy and bilateral salpingo-oophorectomy   3. COPD mixed type ([place])   4. Gastroesophageal reflux disease, esophagitis presence not specified   5. Insomnia secondary to anxiety   6. Mixed hyperlipidemia   7. Coronary artery disease involving native coronary artery of native heart without angina pectoris   8. Flu vaccine need     There are no diagnoses linked to this encounter.  No problem-specific Assessment & Plan notes found for this encounter.    Education and routine counseling performed. Handouts provided.   New Prescriptions   CHLORPHENIRAMINE-HYDROCODONE (TUSSIONEX) 10-8 MG/5ML SUER    Take 5 mLs every 12 (twelve) hours as needed by mouth for cough (cough, will cause drowsiness.).    Discontinued Medications   CHLORPHENIRAMINE-HYDROCODONE (TUSSIONEX) 10-8 MG/5ML SUER    Take 5 mLs by mouth every 12 (twelve) hours as needed for cough (cough, will cause drowsiness.).   DULOXETINE (CYMBALTA) 20 MG CAPSULE    Take 1 capsule (20 mg total) by mouth daily.   IBUPROFEN (ADVIL,MOTRIN) 200 MG TABLET    Take 800 mg by mouth every 6 (six) hours as needed for headache or moderate pain.   VITAMIN D, CHOLECALCIFEROL, 1000 UNITS CAPS    Take 2,000 Units by mouth daily.   VITAMIN D, CHOLECALCIFEROL, 1000 UNITS CAPS    Take 5,000 Units by mouth daily.    Modified Medications   Modified Medication Previous Medication   AMITRIPTYLINE (ELAVIL) 25 MG TABLET amitriptyline (ELAVIL) 25 MG tablet      Take 2 tablets (50 mg total) by mouth at bedtime.    Take 2 tablets (50 mg total) by mouth at bedtime.   BUPROPION (WELLBUTRIN XL) 150 MG 24 HR TABLET buPROPion (WELLBUTRIN XL) 150 MG 24 hr tablet      Take 1 tablet (150 mg total) by mouth daily.    TAKE 1 TABLET EVERY DAY   CETIRIZINE (ZYRTEC) 10 MG TABLET cetirizine (ZYRTEC) 10 MG tablet      Take 1 tablet (10 mg total) by  mouth daily.    Take 10 mg by mouth daily.   DULOXETINE (CYMBALTA) 20 MG CAPSULE DULoxetine (CYMBALTA) 20 MG capsule      Take 1 capsule (20 mg total) by mouth daily.    TAKE 1 CAPSULE BY MOUTH ONCE DAILY   LEVOTHYROXINE (SYNTHROID) 175 MCG TABLET levothyroxine (SYNTHROID) 175 MCG tablet      Take 1 tablet (175 mcg total) by mouth daily.    Take 1 tablet (175 mcg total) by mouth daily.   PANTOPRAZOLE (PROTONIX) 20 MG TABLET pantoprazole (PROTONIX) 40 MG tablet      Take 2 tablets (40 mg total) by mouth daily.    Take 1 tablet (40 mg total) by mouth daily.   VITAMIN D, ERGOCALCIFEROL, (DRISDOL) 50000 UNITS CAPS CAPSULE Vitamin D, Ergocalciferol, (DRISDOL) 50000 units CAPS capsule      Take 1 capsule (50,000 Units total) by mouth every 7 (seven) days.    Take 50,000 Units every 7 (seven) days by mouth.    Orders Placed This Encounter  Procedures  . Flu Vaccine QUAD 6+ mos PF IM (Fluarix Quad PF)     Return for for yrly CPE/complete physical exam- 37mo, then chornic f/up in 69mo.  The patient was counseled, risk factors were discussed, anticipatory guidance given.  Gross side effects, risk and benefits, and alternatives of medications  discussed with patient.  Patient is aware that all medications have potential side effects and we are unable to predict every side effect or drug-drug interaction that may occur.  Expresses verbal understanding and consents to current therapy plan and treatment regimen.   Pt was in the office today for 40+ minutes, with over 50% time spent in face to face counseling of various medical concerns and in coordination of care  Please see AVS handed out to patient at the end of our visit for further patient instructions/ counseling done pertaining to today's office visit.    Note: This document was prepared using Dragon voice recognition software and may include unintentional dictation errors.     Subjective:    Chief Complaint  Patient presents with  .  Follow-up    HPI: Amy Hodge is a 61 y.o. female who presents to [facility] today for follow up for HTN.     No problems updated.  Concerns with some short term memory loss- dementia on husband's side of family and pt has been exposed to many of his family members who have had it.  Making patient concern.  She has no family history of dementia or Alzheimer's etc.  She is wondering if she needs to be doing something different to improve her memory. Medicine she asked about prevegen.    Exercises at rec center- walks uphill on treadmill-  9min- 34min then stationary bike--60min.the next day, everyday.  She has questions about her heart rate getting up to 127.  Pt has lost wt, has more energy and feels overall better since coming here and adopting healthier lifestyle  GERD- well controlled on meds, she has tried tried to come off the medicines and her symptoms become much worse but she has not tried a lower dose.  We will do this today.    Weight: She is doing a great job with her weight loss.  She also has modified her diet significantly.  The only thing she eats after 6 PM now is an apple if anything.    -  Initially on her home scale she was 210 when she first started coming to [name] now her home scale showed 179.  Mood- been great, I dont cry anymore.  I see brighter days and hendling life 100% better than I have ever done.   HTN: -  Her blood pressure has been controlled at home.   - Patient reports good compliance with blood pressure medications  - Denies medication S-E   - Smoking Status noted   - She denies new onset of: chest pain, exercise intolerance, shortness of breath, dizziness, visual changes, headache, lower extremity swelling or claudication.   Today their BP is BP: 123/87   Last 3 blood pressure readings in our office are as follows: BP Readings from Last 3 Encounters:  03/30/17 118/84  02/06/17 (!) 143/97  01/11/17 (!) 131/94     Pulse Readings from Last 3 Encounters:  03/30/17 84  02/06/17 80  01/11/17 86    Filed Weights   12/30/16 0823  Weight: 182 lb 4.8 oz (82.7 kg)      Patient Care Team    Relationship Specialty Notifications Start End  Mellody Dance, DO PCP - General Family Medicine  07/05/16   Lelon Perla, MD Consulting Physician Cardiology  05/10/16   Deneise Lever, MD Consulting Physician Pulmonary Disease  05/10/16   Roseanne Kaufman, MD Consulting Physician Orthopedic Surgery  05/10/16  Garlan Fair, MD Consulting Physician Gastroenterology  05/10/16      Lab Results  Component Value Date   CREATININE 0.77 02/06/2017   BUN 11 02/06/2017   NA 142 02/06/2017   K 4.6 02/06/2017   CL 103 02/06/2017   CO2 22 02/06/2017    Lab Results  Component Value Date   CHOL 206 (H) 02/06/2017   CHOL 206 (H) 08/02/2016   CHOL 230 (H) 12/11/2015    Lab Results  Component Value Date   HDL 45 02/06/2017   HDL 40 08/02/2016   HDL 40 (L) 12/11/2015    Lab Results  Component Value Date   LDLCALC 131 (H) 02/06/2017   LDLCALC 115 (H) 08/02/2016   LDLCALC 142 (H) 12/11/2015    Lab Results  Component Value Date   TRIG 149 02/06/2017   TRIG 253 (H) 08/02/2016   TRIG 240 (H) 12/11/2015    Lab Results  Component Value Date   CHOLHDL 4.6 (H) 02/06/2017   CHOLHDL 5.2 (H) 08/02/2016   CHOLHDL 5.8 (H) 12/11/2015    Lab Results  Component Value Date   LDLDIRECT 143.0 11/05/2015   LDLDIRECT 123.0 03/03/2015   LDLDIRECT 161.8 12/28/2010   ===================================================================   Patient Active Problem List   Diagnosis Date Noted  . Senile solar keratosis 02/06/2017  . COPD with acute exacerbation ([place]) 01/11/2017  . S/P total hysterectomy and bilateral salpingo-oophorectomy 12/30/2016  . Adjustment disorder with mixed anxiety and depressed mood 08/02/2016  . Myalgia/ generalized fibromyalgia 08/02/2016  . ACS (acute coronary syndrome)  06/28/2016  . Crescendo angina ([place]) 06/28/2016  . Insomnia secondary to anxiety 05/10/2016  . Cough 04/28/2016  . Obstructive sleep apnea 08/26/2015  . Lung nodule 08/26/2015  . Shingles rash 08/04/2015  . Allergic reaction 06/27/2015  . History of hemoptysis 06/27/2015  . Stented coronary artery 02/26/2015  . Nocturnal hypoxemia 02/26/2015  . Hemoptysis 10/24/2014  . Centrilobular emphysema ([place]) 06/24/2014  . Soft tissue injury of multiple sites/bilateral hands 05/16/2013  . Bite from dog 05/15/2013  . Chest pain 05/15/2013  . Coronary artery disease 04/08/2013  . COPD exacerbation ([place]) 12/31/2010  . Health care maintenance 12/25/2010  . MENOPAUSAL DISORDER 02/16/2010  . Vitamin D deficiency 10/07/2009  . Chronic pain syndrome 02/20/2009  . COMMON MIGRAINE 02/20/2009  . OSTEOARTHRITIS, KNEE, RIGHT 02/20/2009  . BACK PAIN 11/27/2008  . Fatigue- chronic 07/15/2008  . Memory loss 07/15/2008  . PERIPHERAL EDEMA 07/15/2008  . KNEE PAIN, BILATERAL 09/05/2007  . Hyperlipidemia- intol to statins 06/14/2007  . Seasonal and perennial allergic rhinitis 06/14/2007  . COPD mixed type ([place]) 06/14/2007  . post-operative Hypothyroidism 10/06/2006  . Anxiety state 10/06/2006  . Depression 10/06/2006  . GERD 10/06/2006     Past Medical History:  Diagnosis Date  . Allergy   . Anxiety   . COPD (chronic obstructive pulmonary disease) ([place])   . Coronary artery disease    a. s/p PCI of RCA in 2015 b. cath in 12/2015 showed 100% Ost RPDA with collaterals present  . DDD (degenerative disc disease), cervical   . Depression   . DJD (degenerative joint disease) of knee    RT  . GERD (gastroesophageal reflux disease)   . Hashimoto's disease   . Headache(784.0)   . Hyperlipidemia   . Hypertension   . Hypothyroidism   . Myocardial infarction ([place])   . Nocturnal hypoxemia 02/26/2015  . On supplemental oxygen therapy    Oxygen 2.5 l/m at bedtime.  . Stented coronary  artery 02/26/2015      Past Surgical History:  Procedure Laterality Date  . ABDOMINAL HYSTERECTOMY    . APPENDECTOMY  1971  . BUNIONECTOMY Right   . CARDIAC CATHETERIZATION N/A 09/02/2014   Procedure: Left Heart Cath and Coronary Angiography;  Surgeon: Adrian Prows, MD;  Location: [facility];  Service: Cardiovascular;  Laterality: N/A;  . CARDIAC CATHETERIZATION N/A 12/15/2015   Procedure: Left Heart Cath and Coronary Angiography;  Surgeon: Jettie Booze, MD;  Location: [facility];  Service: Cardiovascular;  Laterality: N/A;  . CERVICAL FUSION    . CESAREAN SECTION    . COLONOSCOPY WITH PROPOFOL N/A 02/24/2015   Procedure: COLONOSCOPY WITH PROPOFOL;  Surgeon: Garlan Fair, MD;  Location: [facility];  Service: Endoscopy;  Laterality: N/A;  . ESOPHAGOGASTRODUODENOSCOPY (EGD) WITH PROPOFOL N/A 02/24/2015   Procedure: ESOPHAGOGASTRODUODENOSCOPY (EGD) WITH PROPOFOL;  Surgeon: Garlan Fair, MD;  Location: [facility];  Service: Endoscopy;  Laterality: N/A;  . HAND SURGERY     due to trauma-dog bite.  . I&D EXTREMITY Bilateral 05/17/2013   Procedure: IRRIGATION AND DEBRIDEMENT EXTREMITY;  Surgeon: Roseanne Kaufman, MD;  Location: [facility];  Service: Orthopedics;  Laterality: Bilateral;  . LEFT HEART CATHETERIZATION WITH CORONARY ANGIOGRAM N/A 03/04/2014   Procedure: LEFT HEART CATHETERIZATION WITH CORONARY ANGIOGRAM;  Surgeon: Laverda Page, MD;  [facility] C[facility];  Service: Cardiovascular;  Laterality: N/A;  . OOPHORECTOMY    . SHOULDER SURGERY     RT  . THYROIDECTOMY  1996  . TONSILLECTOMY  1965  . TOTAL ABDOMINAL HYSTERECTOMY W/ BILATERAL SALPINGOOPHORECTOMY  01/2008   with cervical dysplasia  . TUBAL LIGATION  1984     Family History  Problem Relation Age of Onset  . Lung cancer Mother   . Hypertension Mother   . Stroke Mother   . Diabetes Mother   . Diabetes Father   . Hypertension Father   . Heart attack Father   . Asthma Father   . COPD Father   . Alcohol  abuse Father   . Diabetes Sister   . Hypertension Sister      Social History   Substance and Sexual Activity  Drug Use No  ,  Social History   Substance and Sexual Activity  Alcohol Use No  . Alcohol/week: 0.0 oz  ,  Social History   Tobacco Use  Smoking Status Former Smoker  . Packs/day: 2.00  . Years: 25.00  . Pack years: 50.00  . Types: Cigarettes  . Last attempt to quit: 04/23/1996  . Years since quitting: 21.0  Smokeless Tobacco Never Used  ,    Current Outpatient Medications on File Prior to Visit  Medication Sig Dispense Refill  . acetaminophen (TYLENOL) 500 MG tablet Take 1,000 mg by mouth every 8 (eight) hours as needed (for pain or headahces).     Marland Kitchen albuterol (PROVENTIL HFA;VENTOLIN HFA) 108 (90 BASE) MCG/ACT inhaler Inhale 2 puffs into the lungs every 6 (six) hours as needed for wheezing or shortness of breath.    Marland Kitchen amLODipine (NORVASC) 2.5 MG tablet Take 2.5 mg by mouth daily.    Marland Kitchen aspirin EC 81 MG tablet Take 81 mg by mouth daily.    . clopidogrel (PLAVIX) 75 MG tablet TAKE 1 TABLET EVERY DAY WITH BREAKFAST (NEED MD APPOINTMENT) 90 tablet 1  . nitroGLYCERIN (NITROSTAT) 0.4 MG SL tablet Place 0.4 mg under the tongue every 5 (five) minutes as needed for chest pain.    Marland Kitchen  spironolactone (ALDACTONE) 25 MG tablet Take 1 tablet (25 mg total) by mouth daily. 90 tablet 3   No current facility-administered medications on file prior to visit.      Allergies  Allergen Reactions  . Bactrim [Sulfamethoxazole-Trimethoprim] Shortness Of Breath, Anxiety and Palpitations  . Ketorolac Shortness Of Breath  . Morphine Other (See Comments)    headache  . Tetracyclines & Related Itching  . Valsartan Other (See Comments) and Cough    Headaches also   . Bystolic [Nebivolol Hcl] Other (See Comments)    Fatigue- Tired feeling  . Isosorbide Other (See Comments)    HEADACHES   . Levofloxacin Hives  . Lisinopril Cough  . Metoprolol Other (See Comments)    FATIGUE  .  Morphine And Related Other (See Comments)    headache  . Simvastatin Other (See Comments)    Makes legs hurt/ sore   . Statins Other (See Comments)    MYALGIAS  . Tetracycline Hives  . Toradol [Ketorolac Tromethamine] Palpitations     Review of Systems:   General:  Denies fever, chills Optho/Auditory:   Denies visual changes, blurred vision Respiratory:   Denies SOB, cough, wheeze, DIB  Cardiovascular:   Denies chest pain, palpitations, painful respirations Gastrointestinal:   Denies nausea, vomiting, diarrhea.  Endocrine:     Denies new hot or cold intolerance Musculoskeletal:  Denies joint swelling, gait issues, or new unexplained myalgias/ arthralgias Skin:  Denies rash, suspicious lesions  Neurological:    Denies dizziness, unexplained weakness, numbness  Psychiatric/Behavioral:   Denies mood changes  Objective:    Blood pressure 123/87, pulse 83, height 5\' 5"  (1.651 m), weight 182 lb 4.8 oz (82.7 kg).  Body mass index is 30.34 kg/m.  General: Well Developed, well nourished, and in no acute distress.  HEENT: Normocephalic, atraumatic, pupils equal round reactive to light, neck supple, No carotid bruits, no JVD Skin: Warm and dry, cap RF less 2 sec Cardiac: Regular rate and rhythm, S1, S2 WNL's, no murmurs rubs or gallops Respiratory: ECTA B/L, Not using accessory muscles, speaking in full sentences. NeuroM-Sk: Ambulates w/o assistance, moves ext * 4 w/o difficulty, sensation grossly intact.  Ext: scant edema b/l lower ext Psych: No HI/SI, judgement and insight good, Euthymic mood. Full Affect.

## 2017-01-05 ENCOUNTER — Telehealth: Payer: Self-pay | Admitting: Family Medicine

## 2017-01-05 NOTE — Telephone Encounter (Signed)
Patient called states provider prescribed cough medicine that worked well but she still is having coughing spasms and request additional refill be called into [facility] on [address], [facility].  --glh

## 2017-01-05 NOTE — Telephone Encounter (Signed)
Patient called stating she still has the persistent cough and is requesting a refill of the cough medicine. She received a script for it from Dr. Jenetta Downer but [facility] would not fill the entire amount so she ran out sooner than what Dr. Jenetta Downer would have like in her opinion

## 2017-01-06 NOTE — Telephone Encounter (Signed)
Spoke with Dr Raliegh Scarlet, she recommends patient to come in for a follow up since the cough is still causing issues from 12/12/2016. Patient states that she will call Monday to make appointment to follow up if no improvement over the weekend.  Patient advised to use Robitussin DM as needed and if worsens or shortness of breath to go to urgent care over the weekend. MPulliam, CMA/RT(R)

## 2017-01-06 NOTE — Telephone Encounter (Signed)
Patient has been contacted please see additional notes. MPulliam, CMA/RT(R)

## 2017-01-11 ENCOUNTER — Encounter: Payer: Self-pay | Admitting: Family Medicine

## 2017-01-11 ENCOUNTER — Ambulatory Visit: Payer: Medicare HMO | Admitting: Family Medicine

## 2017-01-11 VITALS — BP 131/94 | HR 86 | Temp 98.9°F | Ht 65.0 in | Wt 181.6 lb

## 2017-01-11 DIAGNOSIS — R058 Other specified cough: Secondary | ICD-10-CM

## 2017-01-11 DIAGNOSIS — R05 Cough: Secondary | ICD-10-CM | POA: Diagnosis not present

## 2017-01-11 DIAGNOSIS — J441 Chronic obstructive pulmonary disease with (acute) exacerbation: Secondary | ICD-10-CM | POA: Diagnosis not present

## 2017-01-11 DIAGNOSIS — R062 Wheezing: Secondary | ICD-10-CM | POA: Diagnosis not present

## 2017-01-11 DIAGNOSIS — B9789 Other viral agents as the cause of diseases classified elsewhere: Secondary | ICD-10-CM | POA: Diagnosis not present

## 2017-01-11 DIAGNOSIS — J069 Acute upper respiratory infection, unspecified: Secondary | ICD-10-CM | POA: Diagnosis not present

## 2017-01-11 DIAGNOSIS — J329 Chronic sinusitis, unspecified: Secondary | ICD-10-CM

## 2017-01-11 MED ORDER — HYDROCOD POLST-CPM POLST ER 10-8 MG/5ML PO SUER: 5.0000 mL | Freq: Two times a day (BID) | ORAL | 0 refills | Status: DC | PRN

## 2017-01-11 MED ORDER — PREDNISONE 20 MG PO TABS: ORAL_TABLET | ORAL | 0 refills | Status: DC

## 2017-01-11 MED ORDER — METHYLPREDNISOLONE SODIUM SUCC 125 MG IJ SOLR
125.0000 mg | Freq: Once | INTRAMUSCULAR | Status: AC
  Administered 2017-01-11: 125 mg via INTRAMUSCULAR

## 2017-01-11 NOTE — Patient Instructions (Signed)
To your regiment of the steroids and cough medicine you can use albuterol as needed for wheeze if you feel like you need it.  Also you can add a Tylenol cold and sinus daytime and nighttime to your regimen as well.  Please understand this is most likely a viral upper respiratory\COPD exacerbation that is causing her symptoms and this will usually last 7-10 days and slowly resolved.  If this is lasting more than 10 days and you are no improvement or worse, please call the office for further direction.  Warm fluids such as chicken soup warm tea etc. is important as well as rest and get good sleep etc.   If you are no improvement at 10 days we can just try an antibiotic but if you are worse in any way, I want to see you, listen to you and possibly obtain chest x-ray.

## 2017-01-11 NOTE — Progress Notes (Signed)
Acute Care Office visit  Assessment and plan:  1. Viral URI with cough   2. COPD with acute exacerbation ([place])   3. Productive cough   4. Rhinosinusitis   5. Wheeze     - solumedrol injection today after r/b meds d/c pt   Education and routine counseling performed. Handouts provided.  Anticipatory guidance and routine counseling done re: condition, txmnt options and need for follow up. All questions of patient's were answered.  - Viral vs Allergic vs Bacterial causes for pt's symptoms reveiwed.    - Supportive care and various OTC medications discussed in addition to any prescribed. - Call or RTC if new symptoms, or if no improvement or worse over next couple days.   - Will consider ABX at that time if sx continue past 5-7 days and worsening.    Gross side effects, risk and benefits, and alternatives of medications discussed with patient.  Patient is aware that all medications have potential side effects and we are unable to predict every sideeffect or drug-drug interaction that may occur.  Expresses verbal understanding and consents to current therapy plan and treatment regiment.  Return if symptoms worsen or fail to improve.  Please see AVS handed out to patient at the end of our visit for additional patient instructions/ counseling done pertaining to today's office visit.  Note: This document was prepared using Dragon voice recognition software and may include unintentional dictation errors.    Subjective:    Chief Complaint  Patient presents with  . Cough    HPI:  Pt presents with Sx for 4 day C/o: Productive cough, congestion, postnasal drip with a little sore throat.  Also wheezy and short of breath.  HA  Denies:  objective F/C,   No face pain or ear pain,   No N/V/D,, no Pleuritic CP,  No Rash.    For symptoms patient has tried: Mucinex and Robitussin-DM.  Overall getting:   Worse.  Patient states that Tussionex that she used in the past worked wonders for  her cough.  She tolerated the medicine well.   Past medical history, Surgical history, Family history reviewed and noted below, Social history, Allergies, and Medications have been entered into the medical record, reviewed and changed as needed.   Allergies  Allergen Reactions  . Bactrim [Sulfamethoxazole-Trimethoprim] Shortness Of Breath, Anxiety and Palpitations  . Ketorolac Shortness Of Breath  . Anoro Ellipta [Umeclidinium-Vilanterol]     Per patient, caused painful blisters in her mouth.   . Morphine Other (See Comments)    headache  . Tetracyclines & Related Itching  . Valsartan Other (See Comments) and Cough    Headaches also   . Bystolic [Nebivolol Hcl] Other (See Comments)    Fatigue- Tired feeling  . Isosorbide Other (See Comments)    HEADACHES   . Levofloxacin Hives  . Lisinopril Cough  . Metoprolol Other (See Comments)    FATIGUE  . Morphine And Related Other (See Comments)    headache  . Simvastatin Other (See Comments)    Makes legs hurt/ sore   . Singulair [Montelukast Sodium] Palpitations  . Statins Other (See Comments)    MYALGIAS  . Tetracycline Hives and Itching  . Toradol [Ketorolac Tromethamine] Palpitations    Review of Systems: General:   No F/C, wt loss Pulm:   No DIB, pleuritic chest pain Card:  No CP, palpitations Abd:  No n/v/d or pain Ext:  No inc edema from baseline   Objective:  Blood pressure (!) 131/94, pulse 86, temperature 98.9 F (37.2 C), height 5\' 5"  (1.651 m), weight 181 lb 9.6 oz (82.4 kg). Body mass index is 30.22 kg/m. General: Well Developed, well nourished, appropriate for stated age.  Neuro: Alert and oriented x3, extra-ocular muscles intact, sensation grossly intact.  HEENT: Normocephalic, atraumatic, pupils equal round reactive to light, neck supple, no masses, no painful lymphadenopathy, TM's intact B/L, no acute findings. Nares- patent, clear d/c, OP- clear, mild erythema, No TTP sinuses Skin: Warm and dry, no gross  rash. Cardiac: RRR, S1 S2,  no murmurs rubs or gallops.  Respiratory: ECTA B/L and A/P, Not using accessory muscles, speaking in full sentences- unlabored. Vascular:  No gross lower ext edema, cap RF less 2 sec. Psych: No HI/SI, judgement and insight good, Euthymic mood. Full Affect.   Patient Care Team    Relationship Specialty Notifications Start End  Mellody Dance, DO PCP - General Family Medicine  07/05/16   Lelon Perla, MD Consulting Physician Cardiology  05/10/16   Deneise Lever, MD Consulting Physician Pulmonary Disease  05/10/16   Roseanne Kaufman, MD Consulting Physician Orthopedic Surgery  05/10/16   Garlan Fair, MD Consulting Physician Gastroenterology  05/10/16

## 2017-01-25 ENCOUNTER — Other Ambulatory Visit: Payer: Self-pay | Admitting: Family Medicine

## 2017-01-25 DIAGNOSIS — M791 Myalgia, unspecified site: Secondary | ICD-10-CM

## 2017-01-25 DIAGNOSIS — G894 Chronic pain syndrome: Secondary | ICD-10-CM

## 2017-01-25 DIAGNOSIS — R5382 Chronic fatigue, unspecified: Secondary | ICD-10-CM

## 2017-01-25 DIAGNOSIS — F4323 Adjustment disorder with mixed anxiety and depressed mood: Secondary | ICD-10-CM

## 2017-02-06 ENCOUNTER — Other Ambulatory Visit (INDEPENDENT_AMBULATORY_CARE_PROVIDER_SITE_OTHER): Payer: Medicare HMO

## 2017-02-06 ENCOUNTER — Ambulatory Visit (INDEPENDENT_AMBULATORY_CARE_PROVIDER_SITE_OTHER): Payer: Medicare HMO | Admitting: Family Medicine

## 2017-02-06 VITALS — BP 143/97 | HR 80 | Temp 98.8°F | Ht 65.0 in | Wt 178.7 lb

## 2017-02-06 DIAGNOSIS — N951 Menopausal and female climacteric states: Secondary | ICD-10-CM

## 2017-02-06 DIAGNOSIS — R911 Solitary pulmonary nodule: Secondary | ICD-10-CM | POA: Diagnosis not present

## 2017-02-06 DIAGNOSIS — Z87891 Personal history of nicotine dependence: Secondary | ICD-10-CM

## 2017-02-06 DIAGNOSIS — Z122 Encounter for screening for malignant neoplasm of respiratory organs: Secondary | ICD-10-CM | POA: Diagnosis not present

## 2017-02-06 DIAGNOSIS — R5383 Other fatigue: Secondary | ICD-10-CM

## 2017-02-06 DIAGNOSIS — E559 Vitamin D deficiency, unspecified: Secondary | ICD-10-CM | POA: Diagnosis not present

## 2017-02-06 DIAGNOSIS — Z1239 Encounter for other screening for malignant neoplasm of breast: Secondary | ICD-10-CM

## 2017-02-06 DIAGNOSIS — Z0000 Encounter for general adult medical examination without abnormal findings: Secondary | ICD-10-CM

## 2017-02-06 DIAGNOSIS — Z90722 Acquired absence of ovaries, bilateral: Secondary | ICD-10-CM

## 2017-02-06 DIAGNOSIS — M791 Myalgia, unspecified site: Secondary | ICD-10-CM

## 2017-02-06 DIAGNOSIS — L570 Actinic keratosis: Secondary | ICD-10-CM | POA: Diagnosis not present

## 2017-02-06 DIAGNOSIS — Z13820 Encounter for screening for osteoporosis: Secondary | ICD-10-CM

## 2017-02-06 DIAGNOSIS — X32XXXA Exposure to sunlight, initial encounter: Secondary | ICD-10-CM

## 2017-02-06 DIAGNOSIS — Z1231 Encounter for screening mammogram for malignant neoplasm of breast: Secondary | ICD-10-CM | POA: Diagnosis not present

## 2017-02-06 DIAGNOSIS — Z90710 Acquired absence of both cervix and uterus: Secondary | ICD-10-CM

## 2017-02-06 DIAGNOSIS — E782 Mixed hyperlipidemia: Secondary | ICD-10-CM | POA: Diagnosis not present

## 2017-02-06 DIAGNOSIS — Z9079 Acquired absence of other genital organ(s): Secondary | ICD-10-CM

## 2017-02-06 NOTE — Progress Notes (Signed)
Impression and Recommendations:    1. Encounter for wellness examination   2. Lung nodule   3. S/P total hysterectomy and bilateral salpingo-oophorectomy   4. Screening for breast cancer   5. Osteoporosis screening   6. Postmenopausal disorder   7. History of tobacco use disorder   8. Senile solar keratosis   9. Encounter for screening for lung cancer    Please schedule return visit to remove actinic\atypical solar keratosis right anterior thigh, and discussed this new concern you have.    Please give patient home stool cards as well as a VIS on Shingrix   Hydroquinone Skin Bleaching Cream GENERIC NAME(S): Hydroquinone Uses Hydroquinone is used to lighten the dark patches of skin (also called hyperpigmentation, melasma, "liver spots," "age spots," freckles) caused by pregnancy, birth control pills, hormone medicine, orinjury to the skin. This medicine works by blocking the process in the skin that leads to discoloration. How to use Hydroquinone Skin Bleaching Cream Follow all directions on the product package, or use as directed by your doctor. Before using, apply a small amount of this...  Please see orders section below for further details of actions taken during this office visit.   Expresses verbal understanding and consents to current therapy plan and treatment regiment.  1) Anticipatory Guidance: Discussed importance of wearing a seatbelt while driving, not texting while driving; sunscreen when outside along with yearly skin surveillance; eating a well balanced and modest diet; physical activity at least 25 minutes per day or 150 min/ week of moderate to intense activity.  2) Immunizations / Screenings / Labs:  All immunizations and screenings that patient agrees to, are up-to-date per recommendations or will be updated today.  Patient understands the needs for q 52mo dental and yearly vision screens which pt will schedule independently. Obtain CBC, CMP, HgA1c,  Lipid panel, TSH and vit D when fasting if not already done recently.   3) Weight:   Discussed goal of losing even 5-10% of current body weight which would improve overall feelings of well being and improve objective health data significantly.   Improve nutrient density of diet through increasing intake of fruits and vegetables and decreasing saturated/trans fats, white flour products and refined sugar products.   F-up preventative CPE in 1 year. F/up sooner for chronic care management as discussed and/or prn.   Please see orders placed and AVS handed out to patient at the end of our visit for further patient instructions/ counseling done pertaining to today's office visit.     Subjective:    Chief Complaint  Patient presents with  . Annual Exam   CC:   HPI: Amy Hodge is a 61 y.o. female who presents to [facility] today a yearly health maintenance exam.  Health Maintenance Summary Reviewed and updated, unless pt declines services.  Tobacco History Reviewed:   Y-quit several years ago 23 yrs ago. Alcohol:    No concerns, no excessive use Exercise Habits:   Not meeting AHA guidelines STD concerns:   None; NO- low risk. Not sexually active.  Drug Use:   None Lumps or breast concerns:      no Breast Cancer Family History:      No  Lung cancer screening:- 02/2015 CT chest showed 4 mm right upper lobe nodule which was stable recommended 1 year follow-up.  Approximately 1 year later CT angios chest with aortogram by her cardiologist ordered which showed 5 mm left upper lobe  nodule with no further evaluation needed.  Then in April 2018 patient had a PE study CT angios chest with did not which did not show any nodules or mention any.      Health Maintenance  Topic Date Due  . HIV Screening  08/30/2028 (Originally 10/26/1970)  . MAMMOGRAM  03/22/2019  . COLONOSCOPY  02/24/2020  . TETANUS/TDAP  02/25/2025  . INFLUENZA VACCINE  Completed  .  Hepatitis C Screening  Completed  . PAP SMEAR-Modifier  Discontinued     Wt Readings from Last 3 Encounters:  04/30/18 195 lb (88.5 kg)  03/21/18 194 lb 12.8 oz (88.4 kg)  01/01/18 188 lb (85.3 kg)   BP Readings from Last 3 Encounters:  04/30/18 126/86  03/21/18 127/89  01/01/18 122/68   Pulse Readings from Last 3 Encounters:  04/30/18 86  03/21/18 85  01/01/18 87     Past Medical History:  Diagnosis Date  . Allergy   . Anxiety   . COPD (chronic obstructive pulmonary disease) ([place])   . Coronary artery disease    a. s/p PCI of RCA in 2015 b. cath in 12/2015 showed 100% Ost RPDA with collaterals present  . DDD (degenerative disc disease), cervical   . Depression   . DJD (degenerative joint disease) of knee    RT  . GERD (gastroesophageal reflux disease)   . Hashimoto's disease   . Headache(784.0)   . Hyperlipidemia   . Hypertension   . Hypothyroidism   . Myocardial infarction ([place])   . Nocturnal hypoxemia 02/26/2015  . On supplemental oxygen therapy    Oxygen 2.5 l/m at bedtime.  . Stented coronary artery 02/26/2015      Past Surgical History:  Procedure Laterality Date  . ABDOMINAL HYSTERECTOMY    . APPENDECTOMY  1971  . BUNIONECTOMY Right   . CARDIAC CATHETERIZATION N/A 09/02/2014   Procedure: Left Heart Cath and Coronary Angiography;  Surgeon: Adrian Prows, MD;  Location: [place] [facility];  Service: Cardiovascular;  Laterality: N/A;  . CARDIAC CATHETERIZATION N/A 12/15/2015   Procedure: Left Heart Cath and Coronary Angiography;  Surgeon: Jettie Booze, MD;  Location: [place] [facility];  Service: Cardiovascular;  Laterality: N/A;  . CERVICAL FUSION    . CESAREAN SECTION    . COLONOSCOPY WITH PROPOFOL N/A 02/24/2015   Procedure: COLONOSCOPY WITH PROPOFOL;  Surgeon: Garlan Fair, MD;  Location: [facility];  Service: Endoscopy;  Laterality: N/A;  . ESOPHAGOGASTRODUODENOSCOPY (EGD) WITH PROPOFOL N/A 02/24/2015   Procedure:  ESOPHAGOGASTRODUODENOSCOPY (EGD) WITH PROPOFOL;  Surgeon: Garlan Fair, MD;  Location: [facility];  Service: Endoscopy;  Laterality: N/A;  . HAND SURGERY     due to trauma-dog bite.  . I&D EXTREMITY Bilateral 05/17/2013   Procedure: IRRIGATION AND DEBRIDEMENT EXTREMITY;  Surgeon: Roseanne Kaufman, MD;  Location: [place];  Service: Orthopedics;  Laterality: Bilateral;  . LEFT HEART CATHETERIZATION WITH CORONARY ANGIOGRAM N/A 03/04/2014   Procedure: LEFT HEART CATHETERIZATION WITH CORONARY ANGIOGRAM;  Surgeon: Laverda Page, MD;  Location: [facility] C[facility];  Service: Cardiovascular;  Laterality: N/A;  . OOPHORECTOMY    . SHOULDER SURGERY     RT  . THYROIDECTOMY  1996  . TONSILLECTOMY  1965  . TOTAL ABDOMINAL HYSTERECTOMY W/ BILATERAL SALPINGOOPHORECTOMY  01/2008   with cervical dysplasia  . TUBAL LIGATION  1984      Family History  Problem Relation Age of Onset  . Lung cancer Mother   . Hypertension Mother   . Stroke Mother   .  Diabetes Mother   . Diabetes Father   . Hypertension Father   . Heart attack Father   . Asthma Father   . COPD Father   . Alcohol abuse Father   . Diabetes Sister   . Hypertension Sister       Social History   Substance and Sexual Activity  Drug Use No  ,   Social History   Substance and Sexual Activity  Alcohol Use No  . Alcohol/week: 0.0 standard drinks  ,   Social History   Tobacco Use  Smoking Status Former Smoker  . Packs/day: 2.00  . Years: 25.00  . Pack years: 50.00  . Types: Cigarettes  . Last attempt to quit: 04/23/1996  . Years since quitting: 22.0  Smokeless Tobacco Never Used  ,   Social History   Substance and Sexual Activity  Sexual Activity Not Currently    Current Outpatient Medications on File Prior to Visit  Medication Sig Dispense Refill  . acetaminophen (TYLENOL) 500 MG tablet Take 1,000 mg by mouth every 8 (eight) hours as needed (for pain or headahces).     Marland Kitchen aspirin EC 81 MG tablet Take 81 mg by mouth  daily.    . nitroGLYCERIN (NITROSTAT) 0.4 MG SL tablet Place 0.4 mg under the tongue every 5 (five) minutes as needed for chest pain.     No current facility-administered medications on file prior to visit.     Allergies: Bactrim [sulfamethoxazole-trimethoprim]; Ketorolac; Anoro ellipta [umeclidinium-vilanterol]; Morphine; Tetracyclines & related; Valsartan; Bystolic [nebivolol hcl]; Isosorbide; Levofloxacin; Lisinopril; Metoprolol; Morphine and related; Simvastatin; Singulair [montelukast sodium]; Statins; Tetracycline; and Toradol [ketorolac tromethamine]  Review of Systems: General:   Denies fever, chills, unexplained weight loss.  Optho/Auditory:   Denies visual changes, blurred vision/LOV Respiratory:   Denies SOB, DOE more than baseline levels.  Cardiovascular:   Denies chest pain, palpitations, new onset peripheral edema  Gastrointestinal:   Denies nausea, vomiting, diarrhea.  Genitourinary: Denies dysuria, freq/ urgency, flank pain or discharge from genitals.  Endocrine:     Denies hot or cold intolerance, polyuria, polydipsia. Musculoskeletal:   Denies unexplained myalgias, joint swelling, unexplained arthralgias, gait problems.  Skin:  Denies rash, suspicious lesions Neurological:     Denies dizziness, unexplained weakness, numbness  Psychiatric/Behavioral:   Denies mood changes, suicidal or homicidal ideations, hallucinations    Objective:    Blood pressure (!) 143/97, pulse 80, temperature 98.8 F (37.1 C), height 5\' 5"  (1.651 m), weight 178 lb 11.2 oz (81.1 kg). Body mass index is 29.74 kg/m. General Appearance:    Alert, cooperative, no distress, appears stated age  Head:    Normocephalic, without obvious abnormality, atraumatic  Eyes:    PERRL, conjunctiva/corneas clear, EOM's intact, fundi    benign, both eyes  Ears:    Normal TM's and external ear canals, both ears  Nose:   Nares normal, septum midline, mucosa normal, no drainage    or sinus tenderness  Throat:    Lips w/o lesion, mucosa moist, and tongue normal; teeth and   gums normal  Neck:   Supple, symmetrical, trachea midline, no adenopathy;    thyroid:  no enlargement/tenderness/nodules; no carotid   bruit or JVD  Back:     Symmetric, no curvature, ROM normal, no CVA tenderness  Lungs:     Clear to auscultation bilaterally, respirations unlabored, no       Wh/ R/ R  Chest Wall:    No tenderness or gross deformity; normal excursion   Heart:  Regular rate and rhythm, S1 and S2 normal, no murmur, rub   or gallop  Breast Exam:    No tenderness, masses, or nipple abnormality b/l; no d/c  Abdomen:     Soft, non-tender, bowel sounds active all four quadrants, NO   G/R/R, no masses, no organomegaly  Genitalia:    Ext genitalia: without lesion, no rash or discharge, No         tenderness;  Cervix: WNL's w/o discharge or lesion;        Adnexa:  No tenderness or palpable masses   Rectal:    Normal tone, no masses or tenderness;   guaiac negative stool  Extremities:   Extremities normal, atraumatic, no cyanosis or gross edema  Pulses:   2+ and symmetric all extremities  Skin:   Warm, dry, Skin color, texture, turgor normal, no obvious rashes or lesions Psych: No HI/SI, judgement and insight good, Euthymic mood. Full Affect.  Neurologic:   CNII-XII intact, normal strength, sensation and reflexes    Throughout   DEXA:    [email or web address]   Based on the [place] FRAX tool, a 59 year old white woman with no other risk factors has a 9.3% 10-year risk for any osteoporotic fracture. White women between the ages of 22 and 56 years with equivalent or greater 10-year fracture risks based on specific risk factors include but are not limited to the following persons: 24) a 60 year old current smoker with a BMI less than 21 kg/m2, daily alcohol use, and parental fracture history; 10) a 59 year old woman with a parental fracture history; 20) a 59 year old woman with a BMI less than 21 kg/m2 and daily alcohol  use; and 41) a 59 year old current smoker with daily alcohol use.   The FRAX tool also predicts 10-year fracture risks for black, Asian, and Hispanic women in the [place]. In general, estimated fracture risks in nonwhite women are lower than those for white women of the same age. Although the USPSTF recommends using a 9.3% 10-year fracture risk threshold to screen women aged 71 to 32 years,

## 2017-02-06 NOTE — Patient Instructions (Addendum)
Please schedule return visit to remove actinic\atypical solar keratosis right anterior thigh, and discussed this new concern you have.    Please give patient home stool cards as well as a VIS on [employer]   Hydroquinone Skin Bleaching Cream GENERIC NAME(S): Hydroquinone Uses Hydroquinone is used to lighten the dark patches of skin (also called hyperpigmentation, melasma, "liver spots," "age spots," freckles) caused by pregnancy, birth control pills, hormone medicine, orinjury to the skin. This medicine works by blocking the process in the skin that leads to discoloration. How to use Hydroquinone Skin Bleaching Cream Follow all directions on the product package, or use as directed by your doctor. Before using, apply a small amount of this...   Preventive Care for Adults, Female  A healthy lifestyle and preventive care can promote health and wellness. Preventive health guidelines for women include the following key practices.   A routine yearly physical is a good way to check with your health care provider about your health and preventive screening. It is a chance to share any concerns and updates on your health and to receive a thorough exam.   Visit your dentist for a routine exam and preventive care every 6 months. Brush your teeth twice a day and floss once a day. Good oral hygiene prevents tooth decay and gum disease.   The frequency of eye exams is based on your age, health, family medical history, use of contact lenses, and other factors. Follow your health care provider's recommendations for frequency of eye exams.   Eat a healthy diet. Foods like vegetables, fruits, whole grains, low-fat dairy products, and lean protein foods contain the nutrients you need without too many calories. Decrease your intake of foods high in solid fats, added sugars, and salt. Eat the right amount of calories for you.Get information about a proper diet from your health care provider, if  necessary.   Regular physical exercise is one of the most important things you can do for your health. Most adults should get at least 150 minutes of moderate-intensity exercise (any activity that increases your heart rate and causes you to sweat) each week. In addition, most adults need muscle-strengthening exercises on 2 or more days a week.   Maintain a healthy weight. The body mass index (BMI) is a screening tool to identify possible weight problems. It provides an estimate of body fat based on height and weight. Your health care provider can find your BMI, and can help you achieve or maintain a healthy weight.For adults 20 years and older:   - A BMI below 18.5 is considered underweight.   - A BMI of 18.5 to 24.9 is normal.   - A BMI of 25 to 29.9 is considered overweight.   - A BMI of 30 and above is considered obese.   Maintain normal blood lipids and cholesterol levels by exercising and minimizing your intake of trans and saturated fats.  Eat a balanced diet with plenty of fruit and vegetables. Blood tests for lipids and cholesterol should begin at age 42 and be repeated every 5 years minimum.  If your lipid or cholesterol levels are high, you are over 40, or you are at high risk for heart disease, you may need your cholesterol levels checked more frequently.Ongoing high lipid and cholesterol levels should be treated with medicines if diet and exercise are not working.   If you smoke, find out from your health care provider how to quit. If you do not use tobacco, do not start.  Lung cancer screening is recommended for adults aged 78-80 years who are at high risk for developing lung cancer because of a history of smoking. A yearly low-dose CT scan of the lungs is recommended for people who have at least a 30-pack-year history of smoking and are a current smoker or have quit within the past 15 years. A pack year of smoking is smoking an average of 1 pack of cigarettes a day for 1 year (for  example: 1 pack a day for 30 years or 2 packs a day for 15 years). Yearly screening should continue until the smoker has stopped smoking for at least 15 years. Yearly screening should be stopped for people who develop a health problem that would prevent them from having lung cancer treatment.   If you are pregnant, do not drink alcohol. If you are breastfeeding, be very cautious about drinking alcohol. If you are not pregnant and choose to drink alcohol, do not have more than 1 drink per day. One drink is considered to be 12 ounces (355 mL) of beer, 5 ounces (148 mL) of wine, or 1.5 ounces (44 mL) of liquor.   Avoid use of street drugs. Do not share needles with anyone. Ask for help if you need support or instructions about stopping the use of drugs.   High blood pressure causes heart disease and increases the risk of stroke. Your blood pressure should be checked at least yearly.  Ongoing high blood pressure should be treated with medicines if weight loss and exercise do not work.   If you are 3-3 years old, ask your health care provider if you should take aspirin to prevent strokes.   Diabetes screening involves taking a blood sample to check your fasting blood sugar level. This should be done once every 3 years, after age 9, if you are within normal weight and without risk factors for diabetes. Testing should be considered at a younger age or be carried out more frequently if you are overweight and have at least 1 risk factor for diabetes.   Breast cancer screening is essential preventive care for women. You should practice "breast self-awareness."  This means understanding the normal appearance and feel of your breasts and may include breast self-examination.  Any changes detected, no matter how small, should be reported to a health care provider.  Women in their 11s and 30s should have a clinical breast exam (CBE) by a health care provider as part of a regular health exam every 1 to 3 years.   After age 16, women should have a CBE every year.  Starting at age 37, women should consider having a mammogram (breast X-ray test) every year.  Women who have a family history of breast cancer should talk to their health care provider about genetic screening.  Women at a high risk of breast cancer should talk to their health care providers about having an MRI and a mammogram every year.   -Breast cancer gene (BRCA)-related cancer risk assessment is recommended for women who have family members with BRCA-related cancers. BRCA-related cancers include breast, ovarian, tubal, and peritoneal cancers. Having family members with these cancers may be associated with an increased risk for harmful changes (mutations) in the breast cancer genes BRCA1 and BRCA2. Results of the assessment will determine the need for genetic counseling and BRCA1 and BRCA2 testing.   The Pap test is a screening test for cervical cancer. A Pap test can show cell changes on the cervix that might become  cervical cancer if left untreated. A Pap test is a procedure in which cells are obtained and examined from the lower end of the uterus (cervix).   - Women should have a Pap test starting at age 63.   - Between ages 80 and 15, Pap tests should be repeated every 2 years.   - Beginning at age 83, you should have a Pap test every 3 years as long as the past 3 Pap tests have been normal.   - Some women have medical problems that increase the chance of getting cervical cancer. Talk to your health care provider about these problems. It is especially important to talk to your health care provider if a new problem develops soon after your last Pap test. In these cases, your health care provider may recommend more frequent screening and Pap tests.   - The above recommendations are the same for women who have or have not gotten the vaccine for human papillomavirus (HPV).   - If you had a hysterectomy for a problem that was not cancer or a  condition that could lead to cancer, then you no longer need Pap tests. Even if you no longer need a Pap test, a regular exam is a good idea to make sure no other problems are starting.   - If you are between ages 44 and 8 years, and you have had normal Pap tests going back 10 years, you no longer need Pap tests. Even if you no longer need a Pap test, a regular exam is a good idea to make sure no other problems are starting.   - If you have had past treatment for cervical cancer or a condition that could lead to cancer, you need Pap tests and screening for cancer for at least 20 years after your treatment.   - If Pap tests have been discontinued, risk factors (such as a new sexual partner) need to be reassessed to determine if screening should be resumed.   - The HPV test is an additional test that may be used for cervical cancer screening. The HPV test looks for the virus that can cause the cell changes on the cervix. The cells collected during the Pap test can be tested for HPV. The HPV test could be used to screen women aged 39 years and older, and should be used in women of any age who have unclear Pap test results. After the age of 34, women should have HPV testing at the same frequency as a Pap test.   Colorectal cancer can be detected and often prevented. Most routine colorectal cancer screening begins at the age of 78 years and continues through age 69 years. However, your health care provider may recommend screening at an earlier age if you have risk factors for colon cancer. On a yearly basis, your health care provider may provide home test kits to check for hidden blood in the stool.  Use of a small camera at the end of a tube, to directly examine the colon (sigmoidoscopy or colonoscopy), can detect the earliest forms of colorectal cancer. Talk to your health care provider about this at age 35, when routine screening begins. Direct exam of the colon should be repeated every 5 -10 years  through age 44 years, unless early forms of pre-cancerous polyps or small growths are found.   People who are at an increased risk for hepatitis B should be screened for this virus. You are considered at high risk for hepatitis B if:  -  You were born in a country where hepatitis B occurs often. Talk with your health care provider about which countries are considered high risk.  - Your parents were born in a high-risk country and you have not received a shot to protect against hepatitis B (hepatitis B vaccine).  - You have HIV or AIDS.  - You use needles to inject street drugs.  - You live with, or have sex with, someone who has Hepatitis B.  - You get hemodialysis treatment.  - You take certain medicines for conditions like cancer, organ transplantation, and autoimmune conditions.   Hepatitis C blood testing is recommended for all people born from [birth year] through 1963 and any individual with known risks for hepatitis C.   Practice safe sex. Use condoms and avoid high-risk sexual practices to reduce the spread of sexually transmitted infections (STIs). STIs include gonorrhea, chlamydia, syphilis, trichomonas, herpes, HPV, and human immunodeficiency virus (HIV). Herpes, HIV, and HPV are viral illnesses that have no cure. They can result in disability, cancer, and death. Sexually active women aged 45 years and younger should be checked for chlamydia. Older women with new or multiple partners should also be tested for chlamydia. Testing for other STIs is recommended if you are sexually active and at increased risk.   Osteoporosis is a disease in which the bones lose minerals and strength with aging. This can result in serious bone fractures or breaks. The risk of osteoporosis can be identified using a bone density scan. Women ages 18 years and over and women at risk for fractures or osteoporosis should discuss screening with their health care providers. Ask your health care provider whether you should  take a calcium supplement or vitamin D to There are also several preventive steps women can take to avoid osteoporosis and resulting fractures or to keep osteoporosis from worsening. -->Recommendations include:  Eat a balanced diet high in fruits, vegetables, calcium, and vitamins.  Get enough calcium. The recommended total intake of is 1,200 mg daily; for best absorption, if taking supplements, divide doses into 250-500 mg doses throughout the day. Of the two types of calcium, calcium carbonate is best absorbed when taken with food but calcium citrate can be taken on an empty stomach.  Get enough vitamin D. [employer] and the [employer] recommend at least 1,000 IU per day for women age 57 and over who are at risk of vitamin D deficiency. Vitamin D deficiency can be caused by inadequate sun exposure (for example, those who live in [place]).  Avoid alcohol and smoking. Heavy alcohol intake (more than 7 drinks per week) increases the risk of falls and hip fracture and women smokers tend to lose bone more rapidly and have lower bone mass than nonsmokers. Stopping smoking is one of the most important changes women can make to improve their health and decrease risk for disease.  Be physically active every day. Weight-bearing exercise (for example, fast walking, hiking, jogging, and weight training) may strengthen bones or slow the rate of bone loss that comes with aging. Balancing and muscle-strengthening exercises can reduce the risk of falling and fracture.  Consider therapeutic medications. Currently, several types of effective drugs are available. Healthcare providers can recommend the type most appropriate for each woman.  Eliminate environmental factors that may contribute to accidents. Falls cause nearly 90% of all osteoporotic fractures, so reducing this risk is an important bone-health strategy. Measures include ample lighting, removing obstructions to walking, using  nonskid rugs on floors, and placing  mats and/or grab bars in showers.  Be aware of medication side effects. Some common medicines make bones weaker. These include a type of steroid drug called glucocorticoids used for arthritis and asthma, some antiseizure drugs, certain sleeping pills, treatments for endometriosis, and some cancer drugs. An overactive thyroid gland or using too much thyroid hormone for an underactive thyroid can also be a problem. If you are taking these medicines, talk to your doctor about what you can do to help protect your bones.reduce the rate of osteoporosis.    Menopause can be associated with physical symptoms and risks. Hormone replacement therapy is available to decrease symptoms and risks. You should talk to your health care provider about whether hormone replacement therapy is right for you.   Use sunscreen. Apply sunscreen liberally and repeatedly throughout the day. You should seek shade when your shadow is shorter than you. Protect yourself by wearing long sleeves, pants, a wide-brimmed hat, and sunglasses year round, whenever you are outdoors.   Once a month, do a whole body skin exam, using a mirror to look at the skin on your back. Tell your health care provider of new moles, moles that have irregular borders, moles that are larger than a pencil eraser, or moles that have changed in shape or color.   -Stay current with required vaccines (immunizations).   Influenza vaccine. All adults should be immunized every year.  Tetanus, diphtheria, and acellular pertussis (Td, Tdap) vaccine. Pregnant women should receive 1 dose of Tdap vaccine during each pregnancy. The dose should be obtained regardless of the length of time since the last dose. Immunization is preferred during the 27th 36th week of gestation. An adult who has not previously received Tdap or who does not know her vaccine status should receive 1 dose of Tdap. This initial dose should be followed by  tetanus and diphtheria toxoids (Td) booster doses every 10 years. Adults with an unknown or incomplete history of completing a 3-dose immunization series with Td-containing vaccines should begin or complete a primary immunization series including a Tdap dose. Adults should receive a Td booster every 10 years.  Varicella vaccine. An adult without evidence of immunity to varicella should receive 2 doses or a second dose if she has previously received 1 dose. Pregnant females who do not have evidence of immunity should receive the first dose after pregnancy. This first dose should be obtained before leaving the health care facility. The second dose should be obtained 4 8 weeks after the first dose.  Human papillomavirus (HPV) vaccine. Females aged 40 26 years who have not received the vaccine previously should obtain the 3-dose series. The vaccine is not recommended for use in pregnant females. However, pregnancy testing is not needed before receiving a dose. If a female is found to be pregnant after receiving a dose, no treatment is needed. In that case, the remaining doses should be delayed until after the pregnancy. Immunization is recommended for any person with an immunocompromised condition through the age of 72 years if she did not get any or all doses earlier. During the 3-dose series, the second dose should be obtained 4 8 weeks after the first dose. The third dose should be obtained 24 weeks after the first dose and 16 weeks after the second dose.  Zoster vaccine. One dose is recommended for adults aged 58 years or older unless certain conditions are present.  Measles, mumps, and rubella (MMR) vaccine. Adults born before [birth year] generally are considered immune to measles and  mumps. Adults born in [birth year] or later should have 1 or more doses of MMR vaccine unless there is a contraindication to the vaccine or there is laboratory evidence of immunity to each of the three diseases. A routine second dose of MMR  vaccine should be obtained at least 28 days after the first dose for students attending postsecondary schools, health care workers, or international travelers. People who received inactivated measles vaccine or an unknown type of measles vaccine during 1963 1967 should receive 2 doses of MMR vaccine. People who received inactivated mumps vaccine or an unknown type of mumps vaccine before 1979 and are at high risk for mumps infection should consider immunization with 2 doses of MMR vaccine. For females of childbearing age, rubella immunity should be determined. If there is no evidence of immunity, females who are not pregnant should be vaccinated. If there is no evidence of immunity, females who are pregnant should delay immunization until after pregnancy. Unvaccinated health care workers born before [birth year] who lack laboratory evidence of measles, mumps, or rubella immunity or laboratory confirmation of disease should consider measles and mumps immunization with 2 doses of MMR vaccine or rubella immunization with 1 dose of MMR vaccine.  Pneumococcal 13-valent conjugate (PCV13) vaccine. When indicated, a person who is uncertain of her immunization history and has no record of immunization should receive the PCV13 vaccine. An adult aged 93 years or older who has certain medical conditions and has not been previously immunized should receive 1 dose of PCV13 vaccine. This PCV13 should be followed with a dose of pneumococcal polysaccharide (PPSV23) vaccine. The PPSV23 vaccine dose should be obtained at least 8 weeks after the dose of PCV13 vaccine. An adult aged 45 years or older who has certain medical conditions and previously received 1 or more doses of PPSV23 vaccine should receive 1 dose of PCV13. The PCV13 vaccine dose should be obtained 1 or more years after the last PPSV23 vaccine dose.  Pneumococcal polysaccharide (PPSV23) vaccine. When PCV13 is also indicated, PCV13 should be obtained first. All adults aged 52  years and older should be immunized. An adult younger than age 77 years who has certain medical conditions should be immunized. Any person who resides in a nursing home or long-term care facility should be immunized. An adult smoker should be immunized. People with an immunocompromised condition and certain other conditions should receive both PCV13 and PPSV23 vaccines. People with human immunodeficiency virus (HIV) infection should be immunized as soon as possible after diagnosis. Immunization during chemotherapy or radiation therapy should be avoided. Routine use of PPSV23 vaccine is not recommended for American Indians, [place] Natives, or people younger than 65 years unless there are medical conditions that require PPSV23 vaccine. When indicated, people who have unknown immunization and have no record of immunization should receive PPSV23 vaccine. One-time revaccination 5 years after the first dose of PPSV23 is recommended for people aged 67 64 years who have chronic kidney failure, nephrotic syndrome, asplenia, or immunocompromised conditions. People who received 1 2 doses of PPSV23 before age 16 years should receive another dose of PPSV23 vaccine at age 42 years or later if at least 5 years have passed since the previous dose. Doses of PPSV23 are not needed for people immunized with PPSV23 at or after age 29 years.  Meningococcal vaccine. Adults with asplenia or persistent complement component deficiencies should receive 2 doses of quadrivalent meningococcal conjugate (MenACWY-D) vaccine. The doses should be obtained at least 2 months apart. Microbiologists working with certain  meningococcal bacteria, [employer] recruits, people at risk during an outbreak, and people who travel to or live in countries with a high rate of meningitis should be immunized. A first-year college student up through age 80 years who is living in a residence hall should receive a dose if she did not receive a dose on or after her 16th  birthday. Adults who have certain high-risk conditions should receive one or more doses of vaccine.  Hepatitis A vaccine. Adults who wish to be protected from this disease, have certain high-risk conditions, work with hepatitis A-infected animals, work in hepatitis A research labs, or travel to or work in countries with a high rate of hepatitis A should be immunized. Adults who were previously unvaccinated and who anticipate close contact with an international adoptee during the first 60 days after arrival in the [place] from a country with a high rate of hepatitis A should be immunized.  Hepatitis B vaccine.  Adults who wish to be protected from this disease, have certain high-risk conditions, may be exposed to blood or other infectious body fluids, are household contacts or sex partners of hepatitis B positive people, are clients or workers in certain care facilities, or travel to or work in countries with a high rate of hepatitis B should be immunized.  Haemophilus influenzae type b (Hib) vaccine. A previously unvaccinated person with asplenia or sickle cell disease or having a scheduled splenectomy should receive 1 dose of Hib vaccine. Regardless of previous immunization, a recipient of a hematopoietic stem cell transplant should receive a 3-dose series 6 12 months after her successful transplant. Hib vaccine is not recommended for adults with HIV infection.  Preventive Services / Frequency Ages 35 to 39years  Blood pressure check.** / Every 1 to 2 years.  Lipid and cholesterol check.** / Every 5 years beginning at age 54.  Clinical breast exam.** / Every 3 years for women in their 46s and 4s.  BRCA-related cancer risk assessment.** / For women who have family members with a BRCA-related cancer (breast, ovarian, tubal, or peritoneal cancers).  Pap test.** / Every 2 years from ages 28 through 109. Every 3 years starting at age 41 through age 44 or 79 with a history of 3 consecutive normal  Pap tests.  HPV screening.** / Every 3 years from ages 75 through ages 82 to 69 with a history of 3 consecutive normal Pap tests.  Hepatitis C blood test.** / For any individual with known risks for hepatitis C.  Skin self-exam. / Monthly.  Influenza vaccine. / Every year.  Tetanus, diphtheria, and acellular pertussis (Tdap, Td) vaccine.** / Consult your health care provider. Pregnant women should receive 1 dose of Tdap vaccine during each pregnancy. 1 dose of Td every 10 years.  Varicella vaccine.** / Consult your health care provider. Pregnant females who do not have evidence of immunity should receive the first dose after pregnancy.  HPV vaccine. / 3 doses over 6 months, if 26 and younger. The vaccine is not recommended for use in pregnant females. However, pregnancy testing is not needed before receiving a dose.  Measles, mumps, rubella (MMR) vaccine.** / You need at least 1 dose of MMR if you were born in 1957 or later. You may also need a 2nd dose. For females of childbearing age, rubella immunity should be determined. If there is no evidence of immunity, females who are not pregnant should be vaccinated. If there is no evidence of immunity, females who are pregnant should delay immunization  until after pregnancy.  Pneumococcal 13-valent conjugate (PCV13) vaccine.** / Consult your health care provider.  Pneumococcal polysaccharide (PPSV23) vaccine.** / 1 to 2 doses if you smoke cigarettes or if you have certain conditions.  Meningococcal vaccine.** / 1 dose if you are age 24 to 44 years and a Market researcher living in a residence hall, or have one of several medical conditions, you need to get vaccinated against meningococcal disease. You may also need additional booster doses.  Hepatitis A vaccine.** / Consult your health care provider.  Hepatitis B vaccine.** / Consult your health care provider.  Haemophilus influenzae type b (Hib) vaccine.** / Consult your health care  provider.  Ages 59 to 64years  Blood pressure check.** / Every 1 to 2 years.  Lipid and cholesterol check.** / Every 5 years beginning at age 16 years.  Lung cancer screening. / Every year if you are aged 3 80 years and have a 30-pack-year history of smoking and currently smoke or have quit within the past 15 years. Yearly screening is stopped once you have quit smoking for at least 15 years or develop a health problem that would prevent you from having lung cancer treatment.  Clinical breast exam.** / Every year after age 9 years.  BRCA-related cancer risk assessment.** / For women who have family members with a BRCA-related cancer (breast, ovarian, tubal, or peritoneal cancers).  Mammogram.** / Every year beginning at age 13 years and continuing for as long as you are in good health. Consult with your health care provider.  Pap test.** / Every 3 years starting at age 37 years through age 70 or 53 years with a history of 3 consecutive normal Pap tests.  HPV screening.** / Every 3 years from ages 69 years through ages 94 to 72 years with a history of 3 consecutive normal Pap tests.  Fecal occult blood test (FOBT) of stool. / Every year beginning at age 5 years and continuing until age 22 years. You may not need to do this test if you get a colonoscopy every 10 years.  Flexible sigmoidoscopy or colonoscopy.** / Every 5 years for a flexible sigmoidoscopy or every 10 years for a colonoscopy beginning at age 66 years and continuing until age 40 years.  Hepatitis C blood test.** / For all people born from [birth year] through 1963 and any individual with known risks for hepatitis C.  Skin self-exam. / Monthly.  Influenza vaccine. / Every year.  Tetanus, diphtheria, and acellular pertussis (Tdap/Td) vaccine.** / Consult your health care provider. Pregnant women should receive 1 dose of Tdap vaccine during each pregnancy. 1 dose of Td every 10 years.  Varicella vaccine.** / Consult your health  care provider. Pregnant females who do not have evidence of immunity should receive the first dose after pregnancy.  Zoster vaccine.** / 1 dose for adults aged 52 years or older.  Measles, mumps, rubella (MMR) vaccine.** / You need at least 1 dose of MMR if you were born in 1957 or later. You may also need a 2nd dose. For females of childbearing age, rubella immunity should be determined. If there is no evidence of immunity, females who are not pregnant should be vaccinated. If there is no evidence of immunity, females who are pregnant should delay immunization until after pregnancy.  Pneumococcal 13-valent conjugate (PCV13) vaccine.** / Consult your health care provider.  Pneumococcal polysaccharide (PPSV23) vaccine.** / 1 to 2 doses if you smoke cigarettes or if you have certain conditions.  Meningococcal vaccine.** /  Consult your health care provider.  Hepatitis A vaccine.** / Consult your health care provider.  Hepatitis B vaccine.** / Consult your health care provider.  Haemophilus influenzae type b (Hib) vaccine.** / Consult your health care provider.  Ages 56 years and over  Blood pressure check.** / Every 1 to 2 years.  Lipid and cholesterol check.** / Every 5 years beginning at age 54 years.  Lung cancer screening. / Every year if you are aged 30 80 years and have a 30-pack-year history of smoking and currently smoke or have quit within the past 15 years. Yearly screening is stopped once you have quit smoking for at least 15 years or develop a health problem that would prevent you from having lung cancer treatment.  Clinical breast exam.** / Every year after age 64 years.  BRCA-related cancer risk assessment.** / For women who have family members with a BRCA-related cancer (breast, ovarian, tubal, or peritoneal cancers).  Mammogram.** / Every year beginning at age 48 years and continuing for as long as you are in good health. Consult with your health care provider.  Pap  test.** / Every 3 years starting at age 44 years through age 9 or 51 years with 3 consecutive normal Pap tests. Testing can be stopped between 65 and 70 years with 3 consecutive normal Pap tests and no abnormal Pap or HPV tests in the past 10 years.  HPV screening.** / Every 3 years from ages 19 years through ages 53 or 29 years with a history of 3 consecutive normal Pap tests. Testing can be stopped between 65 and 70 years with 3 consecutive normal Pap tests and no abnormal Pap or HPV tests in the past 10 years.  Fecal occult blood test (FOBT) of stool. / Every year beginning at age 18 years and continuing until age 79 years. You may not need to do this test if you get a colonoscopy every 10 years.  Flexible sigmoidoscopy or colonoscopy.** / Every 5 years for a flexible sigmoidoscopy or every 10 years for a colonoscopy beginning at age 14 years and continuing until age 55 years.  Hepatitis C blood test.** / For all people born from [birth year] through 1963 and any individual with known risks for hepatitis C.  Osteoporosis screening.** / A one-time screening for women ages 37 years and over and women at risk for fractures or osteoporosis.  Skin self-exam. / Monthly.  Influenza vaccine. / Every year.  Tetanus, diphtheria, and acellular pertussis (Tdap/Td) vaccine.** / 1 dose of Td every 10 years.  Varicella vaccine.** / Consult your health care provider.  Zoster vaccine.** / 1 dose for adults aged 45 years or older.  Pneumococcal 13-valent conjugate (PCV13) vaccine.** / Consult your health care provider.  Pneumococcal polysaccharide (PPSV23) vaccine.** / 1 dose for all adults aged 46 years and older.  Meningococcal vaccine.** / Consult your health care provider.  Hepatitis A vaccine.** / Consult your health care provider.  Hepatitis B vaccine.** / Consult your health care provider.  Haemophilus influenzae type b (Hib) vaccine.** / Consult your health care provider. ** Family history and  personal history of risk and conditions may change your health care provider's recommendations. Document Released: 04/19/2001 Document Revised: 12/12/2012  [facility] Patient Information 2014 [place], [place].   EXERCISE AND DIET:  We recommended that you start or continue a regular exercise program for good health. Regular exercise means any activity that makes your heart beat faster and makes you sweat.  We recommend exercising at least 30 minutes  per day at least 3 days a week, preferably 5.  We also recommend a diet low in fat and sugar / carbohydrates.  Inactivity, poor dietary choices and obesity can cause diabetes, heart attack, stroke, and kidney damage, among others.     ALCOHOL AND SMOKING:  Women should limit their alcohol intake to no more than 7 drinks/beers/glasses of wine (combined, not each!) per week. Moderation of alcohol intake to this level decreases your risk of breast cancer and liver damage.  ( And of course, no recreational drugs are part of a healthy lifestyle.)  Also, you should not be smoking at all or even being exposed to second hand smoke. Most people know smoking can cause cancer, and various heart and lung diseases, but did you know it also contributes to weakening of your bones?  Aging of your skin?  Yellowing of your teeth and nails?   CALCIUM AND VITAMIN D:  Adequate intake of calcium and Vitamin D are recommended.  The recommendations for exact amounts of these supplements seem to change often, but generally speaking 600 mg of calcium (either carbonate or citrate) and 800 units of Vitamin D per day seems prudent. Certain women may benefit from higher intake of Vitamin D.  If you are among these women, your doctor will have told you during your visit.     PAP SMEARS:  Pap smears, to check for cervical cancer or precancers,  have traditionally been done yearly, although recent scientific advances have shown that most women can have pap smears less often.  However,  every woman still should have a physical exam from her gynecologist or primary care physician every year. It will include a breast check, inspection of the vulva and vagina to check for abnormal growths or skin changes, a visual exam of the cervix, and then an exam to evaluate the size and shape of the uterus and ovaries.  And after 59 years of age, a rectal exam is indicated to check for rectal cancers. We will also provide age appropriate advice regarding health maintenance, like when you should have certain vaccines, screening for sexually transmitted diseases, bone density testing, colonoscopy, mammograms, etc.    MAMMOGRAMS:  All women over 2 years old should have a yearly mammogram. Many facilities now offer a "3D" mammogram, which may cost around $50 extra out of pocket. If possible,  we recommend you accept the option to have the 3D mammogram performed.  It both reduces the number of women who will be called back for extra views which then turn out to be normal, and it is better than the routine mammogram at detecting truly abnormal areas.     COLONOSCOPY:  Colonoscopy to screen for colon cancer is recommended for all women at age 98.  We know, you hate the idea of the prep.  We agree, BUT, having colon cancer and not knowing it is worse!!  Colon cancer so often starts as a polyp that can be seen and removed at colonscopy, which can quite literally save your life!  And if your first colonoscopy is normal and you have no family history of colon cancer, most women don't have to have it again for 10 years.  Once every ten years, you can do something that may end up saving your life, right?  We will be happy to help you get it scheduled when you are ready.  Be sure to check your insurance coverage so you understand how much it will cost.  It may  be covered as a preventative service at no cost, but you should check your particular policy.

## 2017-02-07 ENCOUNTER — Encounter: Payer: Self-pay | Admitting: Family Medicine

## 2017-02-07 LAB — LIPID PANEL
CHOL/HDL RATIO: 4.6 ratio — AB (ref 0.0–4.4)
Cholesterol, Total: 206 mg/dL — ABNORMAL HIGH (ref 100–199)
HDL: 45 mg/dL (ref >39)
LDL Calculated: 131 mg/dL — ABNORMAL HIGH (ref 0–99)
Triglycerides: 149 mg/dL (ref 0–149)
VLDL CHOLESTEROL CAL: 30 mg/dL (ref 5–40)

## 2017-02-07 LAB — COMPREHENSIVE METABOLIC PANEL
ALK PHOS: 100 IU/L (ref 39–117)
ALT: 7 IU/L (ref 0–32)
AST: 18 IU/L (ref 0–40)
Albumin/Globulin Ratio: 2 (ref 1.2–2.2)
Albumin: 4.5 g/dL (ref 3.5–5.5)
BILIRUBIN TOTAL: 0.3 mg/dL (ref 0.0–1.2)
BUN/Creatinine Ratio: 14 (ref 9–23)
BUN: 11 mg/dL (ref 6–24)
CHLORIDE: 103 mmol/L (ref 96–106)
CO2: 22 mmol/L (ref 20–29)
Calcium: 9.2 mg/dL (ref 8.7–10.2)
Creatinine, Ser: 0.77 mg/dL (ref 0.57–1.00)
GFR calc Af Amer: 98 mL/min/{1.73_m2} (ref >59)
GFR calc non Af Amer: 85 mL/min/{1.73_m2} (ref >59)
GLUCOSE: 98 mg/dL (ref 65–99)
Globulin, Total: 2.3 g/dL (ref 1.5–4.5)
Potassium: 4.6 mmol/L (ref 3.5–5.2)
Sodium: 142 mmol/L (ref 134–144)
Total Protein: 6.8 g/dL (ref 6.0–8.5)

## 2017-02-07 LAB — CBC WITH DIFFERENTIAL/PLATELET
BASOS ABS: 0 10*3/uL (ref 0.0–0.2)
Basos: 0 %
EOS (ABSOLUTE): 0.2 10*3/uL (ref 0.0–0.4)
EOS: 4 %
HEMATOCRIT: 40.4 % (ref 34.0–46.6)
HEMOGLOBIN: 13.7 g/dL (ref 11.1–15.9)
Immature Grans (Abs): 0 10*3/uL (ref 0.0–0.1)
Immature Granulocytes: 0 %
LYMPHS ABS: 1.8 10*3/uL (ref 0.7–3.1)
Lymphs: 36 %
MCH: 28.8 pg (ref 26.6–33.0)
MCHC: 33.9 g/dL (ref 31.5–35.7)
MCV: 85 fL (ref 79–97)
MONOCYTES: 5 %
MONOS ABS: 0.3 10*3/uL (ref 0.1–0.9)
NEUTROS ABS: 2.8 10*3/uL (ref 1.4–7.0)
Neutrophils: 55 %
Platelets: 303 10*3/uL (ref 150–379)
RBC: 4.76 x10E6/uL (ref 3.77–5.28)
RDW: 14.3 % (ref 12.3–15.4)
WBC: 5 10*3/uL (ref 3.4–10.8)

## 2017-02-07 LAB — HEMOGLOBIN A1C
Est. average glucose Bld gHb Est-mCnc: 111 mg/dL
Hgb A1c MFr Bld: 5.5 % (ref 4.8–5.6)

## 2017-02-07 LAB — TSH: TSH: 0.209 u[IU]/mL — AB (ref 0.450–4.500)

## 2017-02-07 LAB — VITAMIN D 25 HYDROXY (VIT D DEFICIENCY, FRACTURES): Vit D, 25-Hydroxy: 39.1 ng/mL (ref 30.0–100.0)

## 2017-02-09 ENCOUNTER — Encounter: Payer: Self-pay | Admitting: Family Medicine

## 2017-02-18 ENCOUNTER — Other Ambulatory Visit: Payer: Self-pay | Admitting: Family Medicine

## 2017-02-18 DIAGNOSIS — E2839 Other primary ovarian failure: Secondary | ICD-10-CM

## 2017-02-18 DIAGNOSIS — Z780 Asymptomatic menopausal state: Secondary | ICD-10-CM

## 2017-02-23 ENCOUNTER — Ambulatory Visit
Admission: RE | Admit: 2017-02-23 | Discharge: 2017-02-23 | Disposition: A | Discharge status: Home / Self Care | Payer: Medicare HMO | Source: Ambulatory Visit | Attending: Family Medicine | Admitting: Family Medicine

## 2017-02-23 DIAGNOSIS — Z87891 Personal history of nicotine dependence: Secondary | ICD-10-CM | POA: Diagnosis not present

## 2017-02-23 DIAGNOSIS — Z122 Encounter for screening for malignant neoplasm of respiratory organs: Secondary | ICD-10-CM

## 2017-03-03 ENCOUNTER — Encounter: Payer: Self-pay | Admitting: Family Medicine

## 2017-03-21 ENCOUNTER — Ambulatory Visit
Admission: RE | Admit: 2017-03-21 | Discharge: 2017-03-21 | Disposition: A | Discharge status: Home / Self Care | Payer: Medicare HMO | Source: Ambulatory Visit | Attending: Family Medicine | Admitting: Family Medicine

## 2017-03-21 DIAGNOSIS — E2839 Other primary ovarian failure: Secondary | ICD-10-CM

## 2017-03-21 DIAGNOSIS — N951 Menopausal and female climacteric states: Secondary | ICD-10-CM

## 2017-03-21 DIAGNOSIS — Z1239 Encounter for other screening for malignant neoplasm of breast: Secondary | ICD-10-CM

## 2017-03-21 DIAGNOSIS — M85852 Other specified disorders of bone density and structure, left thigh: Secondary | ICD-10-CM | POA: Diagnosis not present

## 2017-03-21 DIAGNOSIS — Z1231 Encounter for screening mammogram for malignant neoplasm of breast: Secondary | ICD-10-CM | POA: Diagnosis not present

## 2017-03-21 DIAGNOSIS — Z87891 Personal history of nicotine dependence: Secondary | ICD-10-CM

## 2017-03-21 DIAGNOSIS — Z780 Asymptomatic menopausal state: Secondary | ICD-10-CM

## 2017-03-30 ENCOUNTER — Ambulatory Visit (INDEPENDENT_AMBULATORY_CARE_PROVIDER_SITE_OTHER): Payer: Medicare HMO | Admitting: Family Medicine

## 2017-03-30 ENCOUNTER — Encounter: Payer: Self-pay | Admitting: Family Medicine

## 2017-03-30 VITALS — BP 118/84 | HR 84 | Ht 65.0 in | Wt 187.8 lb

## 2017-03-30 DIAGNOSIS — G894 Chronic pain syndrome: Secondary | ICD-10-CM | POA: Diagnosis not present

## 2017-03-30 DIAGNOSIS — F5104 Psychophysiologic insomnia: Secondary | ICD-10-CM

## 2017-03-30 DIAGNOSIS — L821 Other seborrheic keratosis: Secondary | ICD-10-CM | POA: Diagnosis not present

## 2017-03-30 DIAGNOSIS — E890 Postprocedural hypothyroidism: Secondary | ICD-10-CM

## 2017-03-30 DIAGNOSIS — I2510 Atherosclerotic heart disease of native coronary artery without angina pectoris: Secondary | ICD-10-CM | POA: Diagnosis not present

## 2017-03-30 DIAGNOSIS — J441 Chronic obstructive pulmonary disease with (acute) exacerbation: Secondary | ICD-10-CM

## 2017-03-30 DIAGNOSIS — F4323 Adjustment disorder with mixed anxiety and depressed mood: Secondary | ICD-10-CM

## 2017-03-30 DIAGNOSIS — M791 Myalgia, unspecified site: Secondary | ICD-10-CM

## 2017-03-30 DIAGNOSIS — R5382 Chronic fatigue, unspecified: Secondary | ICD-10-CM

## 2017-03-30 DIAGNOSIS — L570 Actinic keratosis: Secondary | ICD-10-CM

## 2017-03-30 DIAGNOSIS — X32XXXA Exposure to sunlight, initial encounter: Secondary | ICD-10-CM

## 2017-03-30 DIAGNOSIS — D229 Melanocytic nevi, unspecified: Secondary | ICD-10-CM

## 2017-03-30 MED ORDER — LEVOTHYROXINE SODIUM 175 MCG PO TABS: 175.0000 ug | ORAL_TABLET | Freq: Every day | ORAL | 11 refills | Status: DC

## 2017-03-30 MED ORDER — CETIRIZINE HCL 10 MG PO TABS: 10.0000 mg | ORAL_TABLET | Freq: Every day | ORAL | 1 refills | Status: DC

## 2017-03-30 MED ORDER — VITAMIN D (ERGOCALCIFEROL) 1.25 MG (50000 UNIT) PO CAPS: 50000.0000 [IU] | ORAL_CAPSULE | ORAL | 1 refills | Status: DC

## 2017-03-30 MED ORDER — DULOXETINE HCL 20 MG PO CPEP: 20.0000 mg | ORAL_CAPSULE | Freq: Every day | ORAL | 1 refills | Status: DC

## 2017-03-30 MED ORDER — PANTOPRAZOLE SODIUM 20 MG PO TBEC: 40.0000 mg | DELAYED_RELEASE_TABLET | Freq: Every day | ORAL | 3 refills | Status: DC

## 2017-03-30 MED ORDER — BUPROPION HCL ER (XL) 150 MG PO TB24: 150.0000 mg | ORAL_TABLET | Freq: Every day | ORAL | 1 refills | Status: DC

## 2017-03-30 MED ORDER — AMITRIPTYLINE HCL 25 MG PO TABS: 50.0000 mg | ORAL_TABLET | Freq: Every day | ORAL | 1 refills | Status: DC

## 2017-03-30 NOTE — Patient Instructions (Addendum)
We will let you know about the biopsy results in the near future-usually 5-7 days.  If you have not heard from [name] after 1 week, please call and ask about the results of pathology   -Please call your cardiologist office Dr. Jacalyn Lefevre for #1 an appointment at the lipid clinic and #2 a follow-up appointment with Dr. Stanford Breed.   Patient Instructions by Cristopher Estimable, RN at 07/05/2016 10:00 AM   Author: Cristopher Estimable, RN Author Type: Registered Nurse Filed: 07/05/2016 10:30 AM  Note Status: Signed Cosign: Cosign Not Required Encounter Date: 07/05/2016  Editor: Cristopher Estimable, RN (Registered Nurse)     Follow-Up:  Your physician recommends that you schedule a follow-up appointment [place]  Your physician wants you to follow-up in: [place] will receive a reminder letter in the mail two months in advance. If you don't receive a letter, please call our office to schedule the follow-up appointment

## 2017-03-30 NOTE — Progress Notes (Signed)
Impression and Recommendations:    1. Atypical nevi   2. Postoperative hypothyroidism   3. Adjustment disorder with mixed anxiety and depressed mood   4. Chronic pain syndrome   5. Chronic fatigue   6. Myalgia/ generalized fibromyalgia   7. COPD with acute exacerbation ([place])   8. Psychophysiological insomnia   9. Coronary artery disease involving native coronary artery of native heart without angina pectoris   10. SK (solar keratosis)     1. Atypical nevi- Indication: Atypical Nevi 1.2 cm diameter Medical necessity statement:  On physical examination, irregular skin lesion present with atypical features worrisome for pre-cancerous/ cancerous condition  Consent:  Discussed benefits and risks of procedure and verbal consent obtained  Procedure:  Patient was prepped in sterile fashion for the procedure.  Injection of 0.5cc 2% lido w epi used for anesthesia. In sterile fashion, shave biopsy performed.  Hemostasis obtained with use of silver nitrate sticks and pressure to area.  Pt tolerated well. No complications.  Blood Loss:  less than 1 cc  Post-Care Instructions:   Proper wound care discussed with patient.  If wound does not continue improve as expected, patient told to notify us immediately  2. Postoperative hypothyroidism- recheck TSH in near future. Since pt reports feeling great, has been on the same dose for years, and denies any new symptoms, we will not make any changes at this time. We will continue monitoring this closely.  3. Adjustment disorder with mixed anxiety and depressed mood- Pt is compliant with her medications and tolerating them well. Continue meds as prescribed.  4. Chronic pain syndrome - This is well-controlled and improving. Her Elavil is working very well for her. Continue taking meds as prescribed.  5. Chronic fatigue - well-controlled, pt is stable at this time and will continue current meds.   6. Myalgia/generalized fibromyalgia- her chronic  pain has been much improve after starting the recent medications. Pt continue   7. COPD with acute exacerbation- pt has pulmonologist who is watching her closely for this.  8. Psychophysiological insomnia - this is well-controlled and pt is stable at this time. Continue current meds  9. Coronary artery disease-  her cardiologist will make changes to her medications re: CV Dx as they see fit.   Pt agreed with this and will refer to cardiology for refills of her heart, BP, and hyperlipidemia meds including amlodipine, plavix, and spironolactone.    - She is due for a follow up with Dr. Stanford Breed, cardiologist, as well as lipid clinic  in the near future.   Pt instructed to call and make these appointments.   10. Solar keratosis- Pt agreed to be referred to ambulatory dermatology for future routine skin scans and any skin problems per pt preference. Await path report  -Follow up in 3-4 months for chronic care and routine health maintenance.  Orders Placed This Encounter  Procedures  . Ambulatory referral to Dermatology    Meds ordered this encounter  Medications  . Vitamin D, Ergocalciferol, (DRISDOL) 50000 units CAPS capsule    Sig: Take 1 capsule (50,000 Units total) by mouth every 7 (seven) days.    Dispense:  24 capsule    Refill:  1  . pantoprazole (PROTONIX) 20 MG tablet    Sig: Take 2 tablets (40 mg total) by mouth daily.    Dispense:  90 tablet    Refill:  3  . levothyroxine (SYNTHROID) 175 MCG tablet    Sig: Take 1 tablet (  175 mcg total) by mouth daily.    Dispense:  30 tablet    Refill:  11  . DULoxetine (CYMBALTA) 20 MG capsule    Sig: Take 1 capsule (20 mg total) by mouth daily.    Dispense:  90 capsule    Refill:  1  . cetirizine (ZYRTEC) 10 MG tablet    Sig: Take 1 tablet (10 mg total) by mouth daily.    Dispense:  90 tablet    Refill:  1  . buPROPion (WELLBUTRIN XL) 150 MG 24 hr tablet    Sig: Take 1 tablet (150 mg total) by mouth daily.    Dispense:  90 tablet     Refill:  1  . amitriptyline (ELAVIL) 25 MG tablet    Sig: Take 2 tablets (50 mg total) by mouth at bedtime.    Dispense:  180 tablet    Refill:  1    Gross side effects, risk and benefits, and alternatives of medications and treatment plan in general discussed with patient.  Patient is aware that all medications have potential side effects and we are unable to predict every side effect or drug-drug interaction that may occur.   Patient will call with any questions prior to using medication if they have concerns.  Expresses verbal understanding and consents to current therapy and treatment regimen.  No barriers to understanding were identified.  Red flag symptoms and signs discussed in detail.  Patient expressed understanding regarding what to do in case of emergency\urgent symptoms  Please see AVS handed out to patient at the end of our visit for further patient instructions/ counseling done pertaining to today's office visit.   Return for Please follow-up in roughly 3-4 months for chronic medical problem management.    Note: This note was prepared with assistance of Dragon voice recognition software. Occasional wrong-word or sound-a-like substitutions may have occurred due to the inherent limitations of voice recognition software.   This document serves as a record of services personally performed by Mellody Dance, DO. It was created on her behalf by Mayer Masker, a trained medical scribe. The creation of this record is based on the scribe's personal observations and the provider's statements to them.   I have reviewed the above medical documentation for accuracy and completeness and I concur.  Mellody Dance 03/30/17 1:11 PM   --------------------------------------------------------------------------------------------------------------------------------------------------------------------------------------------------------------------------------------------    Subjective:      HPI: MONICIA TSE is a 62 y.o. female who presents to [place] at [facility] today for issues as discussed below.   -Pt has NKDA.   -She states her elavil is working well and her pain is managed well.  Pt has a cardiologist, Dr. Stanford Breed, who is watching her closely for her heart conditions.   Skin:  First noticed a dark spot on her right upper leg 6 months ago. It was small at first, but over time it has gotten rougher and grown outward and enlarged. She has been scratching the area- it's been irritating her. She has no h/o skin cancer and no fam hx.  Thyroid: She has a PSHx of thyroid and parathyroid removal.  From her most recent blood work on 02-06-17, she had a low TSH.  She does not want to take any medications that will make her gain weight.  She has been taking the same medication and dose for years. She denies any negative symptoms, and does not want to bump back on her dose of thyroid medications.  Mood She denies any problems with her mood.  COPD:  Pt feels short-winded sometimes but has been trying to go to the gym recently.   Wt Readings from Last 3 Encounters:  03/30/17 187 lb 12.8 oz (85.2 kg)  02/06/17 178 lb 11.2 oz (81.1 kg)  01/11/17 181 lb 9.6 oz (82.4 kg)   BP Readings from Last 3 Encounters:  03/30/17 118/84  02/06/17 (!) 143/97  01/11/17 (!) 131/94   Pulse Readings from Last 3 Encounters:  03/30/17 84  02/06/17 80  01/11/17 86   BMI Readings from Last 3 Encounters:  03/30/17 31.25 kg/m  02/06/17 29.74 kg/m  01/11/17 30.22 kg/m     Patient Care Team    Relationship Specialty Notifications Start End  Mellody Dance, DO PCP - General Family Medicine  07/05/16   Lelon Perla, MD Consulting Physician Cardiology  05/10/16   Deneise Lever, MD Consulting Physician Pulmonary Disease  05/10/16   Roseanne Kaufman, MD Consulting Physician Orthopedic Surgery  05/10/16   Garlan Fair, MD Consulting Physician  Gastroenterology  05/10/16      Patient Active Problem List   Diagnosis Date Noted  . Adjustment disorder with mixed anxiety and depressed mood 08/02/2016    Priority: High  . Stented coronary artery 02/26/2015    Priority: High  . Coronary artery disease 04/08/2013    Priority: High  . Hyperlipidemia- intol to statins 06/14/2007    Priority: High  . COPD mixed type ([place]) 06/14/2007    Priority: High  . Insomnia secondary to anxiety 05/10/2016    Priority: Medium  . Obstructive sleep apnea 08/26/2015    Priority: Medium  . Centrilobular emphysema ([place]) 06/24/2014    Priority: Medium  . post-operative Hypothyroidism 10/06/2006    Priority: Medium  . S/P total hysterectomy and bilateral salpingo-oophorectomy 12/30/2016    Priority: Low  . Myalgia/ generalized fibromyalgia 08/02/2016    Priority: Low  . Vitamin D deficiency 10/07/2009    Priority: Low  . Chronic pain syndrome 02/20/2009    Priority: Low  . COMMON MIGRAINE 02/20/2009    Priority: Low  . Fatigue- chronic 07/15/2008    Priority: Low  . GERD 10/06/2006    Priority: Low  . Senile solar keratosis 02/06/2017  . COPD with acute exacerbation ([place]) 01/11/2017  . ACS (acute coronary syndrome) 06/28/2016  . Crescendo angina ([place]) 06/28/2016  . Cough 04/28/2016  . Lung nodule 08/26/2015  . Shingles rash 08/04/2015  . Allergic reaction 06/27/2015  . History of hemoptysis 06/27/2015  . Nocturnal hypoxemia 02/26/2015  . Hemoptysis 10/24/2014  . Soft tissue injury of multiple sites/bilateral hands 05/16/2013  . Bite from dog 05/15/2013  . Chest pain 05/15/2013  . COPD exacerbation ([place]) 12/31/2010  . Health care maintenance 12/25/2010  . MENOPAUSAL DISORDER 02/16/2010  . OSTEOARTHRITIS, KNEE, RIGHT 02/20/2009  . BACK PAIN 11/27/2008  . Memory loss 07/15/2008  . PERIPHERAL EDEMA 07/15/2008  . KNEE PAIN, BILATERAL 09/05/2007  . Seasonal and perennial allergic rhinitis 06/14/2007  . Anxiety state 10/06/2006  .  Depression 10/06/2006    Past Medical history, Surgical history, Family history, Social history, Allergies and Medications have been entered into the medical record, reviewed and changed as needed.    Current Meds  Medication Sig  . acetaminophen (TYLENOL) 500 MG tablet Take 1,000 mg by mouth every 8 (eight) hours as needed (for pain or headahces).   Marland Kitchen albuterol (PROVENTIL HFA;VENTOLIN HFA) 108 (90 BASE) MCG/ACT inhaler Inhale 2 puffs into the lungs  every 6 (six) hours as needed for wheezing or shortness of breath.  Marland Kitchen amitriptyline (ELAVIL) 25 MG tablet Take 2 tablets (50 mg total) by mouth at bedtime.  Marland Kitchen amLODipine (NORVASC) 2.5 MG tablet Take 2.5 mg by mouth daily.  Marland Kitchen aspirin EC 81 MG tablet Take 81 mg by mouth daily.  Marland Kitchen buPROPion (WELLBUTRIN XL) 150 MG 24 hr tablet Take 1 tablet (150 mg total) by mouth daily.  . cetirizine (ZYRTEC) 10 MG tablet Take 1 tablet (10 mg total) by mouth daily.  . chlorpheniramine-HYDROcodone (TUSSIONEX) 10-8 MG/5ML SUER Take 5 mLs every 12 (twelve) hours as needed by mouth for cough (cough, will cause drowsiness.).  Marland Kitchen clopidogrel (PLAVIX) 75 MG tablet TAKE 1 TABLET EVERY DAY WITH BREAKFAST (NEED MD APPOINTMENT)  . DULoxetine (CYMBALTA) 20 MG capsule Take 1 capsule (20 mg total) by mouth daily.  Marland Kitchen levothyroxine (SYNTHROID) 175 MCG tablet Take 1 tablet (175 mcg total) by mouth daily.  . nitroGLYCERIN (NITROSTAT) 0.4 MG SL tablet Place 0.4 mg under the tongue every 5 (five) minutes as needed for chest pain.  . pantoprazole (PROTONIX) 20 MG tablet Take 2 tablets (40 mg total) by mouth daily.  Marland Kitchen spironolactone (ALDACTONE) 25 MG tablet Take 1 tablet (25 mg total) by mouth daily.  . Vitamin D, Ergocalciferol, (DRISDOL) 50000 units CAPS capsule Take 1 capsule (50,000 Units total) by mouth every 7 (seven) days.  . [DISCONTINUED] amitriptyline (ELAVIL) 25 MG tablet Take 2 tablets (50 mg total) by mouth at bedtime.  . [DISCONTINUED] buPROPion (WELLBUTRIN XL) 150 MG 24 hr  tablet TAKE 1 TABLET EVERY DAY  . [DISCONTINUED] cetirizine (ZYRTEC) 10 MG tablet Take 10 mg by mouth daily.  . [DISCONTINUED] DULoxetine (CYMBALTA) 20 MG capsule TAKE 1 CAPSULE BY MOUTH ONCE DAILY  . [DISCONTINUED] levothyroxine (SYNTHROID) 175 MCG tablet Take 1 tablet (175 mcg total) by mouth daily.  . [DISCONTINUED] pantoprazole (PROTONIX) 20 MG tablet Take 2 tablets (40 mg total) by mouth daily.  . [DISCONTINUED] Vitamin D, Ergocalciferol, (DRISDOL) 50000 units CAPS capsule Take 50,000 Units every 7 (seven) days by mouth.    Allergies:  Allergies  Allergen Reactions  . Bactrim [Sulfamethoxazole-Trimethoprim] Shortness Of Breath, Anxiety and Palpitations  . Ketorolac Shortness Of Breath  . Morphine Other (See Comments)    headache  . Tetracyclines & Related Itching  . Valsartan Other (See Comments) and Cough    Headaches also   . Bystolic [Nebivolol Hcl] Other (See Comments)    Fatigue- Tired feeling  . Isosorbide Other (See Comments)    HEADACHES   . Levofloxacin Hives  . Lisinopril Cough  . Metoprolol Other (See Comments)    FATIGUE  . Morphine And Related Other (See Comments)    headache  . Simvastatin Other (See Comments)    Makes legs hurt/ sore   . Statins Other (See Comments)    MYALGIAS  . Tetracycline Hives  . Toradol [Ketorolac Tromethamine] Palpitations     Review of Systems:  A fourteen system review of systems was performed and found to be positive as per HPI.   Objective:   Blood pressure 118/84, pulse 84, height 5\' 5"  (1.651 m), weight 187 lb 12.8 oz (85.2 kg), SpO2 98 %. Body mass index is 31.25 kg/m. General:  Well Developed, well nourished, appropriate for stated age.  Neuro:  Alert and oriented,  extra-ocular muscles intact  HEENT:  Normocephalic, atraumatic, neck supple, no carotid bruits appreciated  Skin:  no gross rash, warm, pink. Cardiac:  RRR, S1  S2 Respiratory:  ECTA B/L and A/P, Not using accessory muscles, speaking in full sentences-  unlabored. Vascular:  Ext warm, no cyanosis apprec.; cap RF less 2 sec. Psych:  No HI/SI, judgement and insight good, Euthymic mood. Full Affect.

## 2017-04-19 NOTE — Progress Notes (Signed)
HPI: FU CAD. Previously followed by Dr Einar Gip. Pt had NSTEMI 4/09 with complicated PCI of RCA (performed in [facility]). Echo 3/15 showed normal LV function, mild LVH, grade 1 DD, mild RVE. ABI 12/15 normal. Cath 6/16 showed 100 PDA, patent stents in proximal and mid RCA, no obstructive disease in the left system; collaterals to the PDA noted; PTCA of CTO unsuccessful. Last cath 10/17 showed 100 PDA (filled with left to right collaterals), patent RCA stent and normal LV function. CTA 4/18 showed no PE, dilated ascending aorta (4.1 cm). H/O cough with ACEI. Myalgias with crestor. Dr Martinique reviewed and did not think attempt at CTO intervention or CABG was appropriate.  Since last seen, patient has occasional chest pain.  It is substernal with radiation to the neck.  It resolves spontaneously.  It can occur both with exertion and at rest.  Some dyspnea on exertion but no orthopnea or PND.  No syncope.  Current Outpatient Medications  Medication Sig Dispense Refill  . acetaminophen (TYLENOL) 500 MG tablet Take 1,000 mg by mouth every 8 (eight) hours as needed (for pain or headahces).     Marland Kitchen albuterol (PROVENTIL HFA;VENTOLIN HFA) 108 (90 BASE) MCG/ACT inhaler Inhale 2 puffs into the lungs every 6 (six) hours as needed for wheezing or shortness of breath.    Marland Kitchen amitriptyline (ELAVIL) 25 MG tablet Take 2 tablets (50 mg total) by mouth at bedtime. 180 tablet 1  . amLODipine (NORVASC) 2.5 MG tablet Take 2.5 mg by mouth daily.    Marland Kitchen aspirin EC 81 MG tablet Take 81 mg by mouth daily.    Marland Kitchen buPROPion (WELLBUTRIN XL) 150 MG 24 hr tablet Take 1 tablet (150 mg total) by mouth daily. 90 tablet 1  . cetirizine (ZYRTEC) 10 MG tablet Take 1 tablet (10 mg total) by mouth daily. 90 tablet 1  . chlorpheniramine-HYDROcodone (TUSSIONEX) 10-8 MG/5ML SUER Take 5 mLs every 12 (twelve) hours as needed by mouth for cough (cough, will cause drowsiness.). 200 mL 0  . clopidogrel (PLAVIX) 75 MG tablet TAKE 1 TABLET EVERY DAY WITH  BREAKFAST (NEED MD APPOINTMENT) 90 tablet 1  . DULoxetine (CYMBALTA) 20 MG capsule Take 1 capsule (20 mg total) by mouth daily. 90 capsule 1  . levothyroxine (SYNTHROID) 175 MCG tablet Take 1 tablet (175 mcg total) by mouth daily. 30 tablet 11  . nitroGLYCERIN (NITROSTAT) 0.4 MG SL tablet Place 0.4 mg under the tongue every 5 (five) minutes as needed for chest pain.    . pantoprazole (PROTONIX) 20 MG tablet Take 2 tablets (40 mg total) by mouth daily. 90 tablet 3  . spironolactone (ALDACTONE) 25 MG tablet Take 1 tablet (25 mg total) by mouth daily. 90 tablet 3  . Vitamin D, Ergocalciferol, (DRISDOL) 50000 units CAPS capsule Take 1 capsule (50,000 Units total) by mouth every 7 (seven) days. 24 capsule 1   No current facility-administered medications for this visit.      Past Medical History:  Diagnosis Date  . Allergy   . Anxiety   . COPD (chronic obstructive pulmonary disease) ([place])   . Coronary artery disease    a. s/p PCI of RCA in 2015 b. cath in 12/2015 showed 100% Ost RPDA with collaterals present  . DDD (degenerative disc disease), cervical   . Depression   . DJD (degenerative joint disease) of knee    RT  . GERD (gastroesophageal reflux disease)   . Hashimoto's disease   . Headache(784.0)   . Hyperlipidemia   .  Hypertension   . Hypothyroidism   . Myocardial infarction ([place])   . Nocturnal hypoxemia 02/26/2015  . On supplemental oxygen therapy    Oxygen 2.5 l/m at bedtime.  . Stented coronary artery 02/26/2015    Past Surgical History:  Procedure Laterality Date  . ABDOMINAL HYSTERECTOMY    . APPENDECTOMY  1971  . BUNIONECTOMY Right   . CARDIAC CATHETERIZATION N/A 09/02/2014   Procedure: Left Heart Cath and Coronary Angiography;  Surgeon: Adrian Prows, MD;  Location: [facility];  Service: Cardiovascular;  Laterality: N/A;  . CARDIAC CATHETERIZATION N/A 12/15/2015   Procedure: Left Heart Cath and Coronary Angiography;  Surgeon: Jettie Booze, MD;  Location:  [facility];  Service: Cardiovascular;  Laterality: N/A;  . CERVICAL FUSION    . CESAREAN SECTION    . COLONOSCOPY WITH PROPOFOL N/A 02/24/2015   Procedure: COLONOSCOPY WITH PROPOFOL;  Surgeon: Garlan Fair, MD;  Location: [facility];  Service: Endoscopy;  Laterality: N/A;  . ESOPHAGOGASTRODUODENOSCOPY (EGD) WITH PROPOFOL N/A 02/24/2015   Procedure: ESOPHAGOGASTRODUODENOSCOPY (EGD) WITH PROPOFOL;  Surgeon: Garlan Fair, MD;  Location: [facility];  Service: Endoscopy;  Laterality: N/A;  . HAND SURGERY     due to trauma-dog bite.  . I&D EXTREMITY Bilateral 05/17/2013   Procedure: IRRIGATION AND DEBRIDEMENT EXTREMITY;  Surgeon: Roseanne Kaufman, MD;  Location: [facility];  Service: Orthopedics;  Laterality: Bilateral;  . LEFT HEART CATHETERIZATION WITH CORONARY ANGIOGRAM N/A 03/04/2014   Procedure: LEFT HEART CATHETERIZATION WITH CORONARY ANGIOGRAM;  Surgeon: Laverda Page, MD;  Location: [facility] C[facility];  Service: Cardiovascular;  Laterality: N/A;  . OOPHORECTOMY    . SHOULDER SURGERY     RT  . THYROIDECTOMY  1996  . TONSILLECTOMY  1965  . TOTAL ABDOMINAL HYSTERECTOMY W/ BILATERAL SALPINGOOPHORECTOMY  01/2008   with cervical dysplasia  . TUBAL LIGATION  1984    Social History   Socioeconomic History  . Marital status: Married    Spouse name: Not on file  . Number of children: 1  . Years of education: Not on file  . Highest education level: Not on file  Social Needs  . Financial resource strain: Not on file  . Food insecurity - worry: Not on file  . Food insecurity - inability: Not on file  . Transportation needs - medical: Not on file  . Transportation needs - non-medical: Not on file  Occupational History  . Occupation: disabled    Employer: [employer]  Tobacco Use  . Smoking status: Former Smoker    Packs/day: 2.00    Years: 25.00    Pack years: 50.00    Types: Cigarettes    Last attempt to quit: 04/23/1996    Years since quitting: 21.0  . Smokeless tobacco:  Never Used  Substance and Sexual Activity  . Alcohol use: No    Alcohol/week: 0.0 oz  . Drug use: No  . Sexual activity: Not Currently  Other Topics Concern  . Not on file  Social History Narrative   On worker's comp for lower back and right shoulder - applying for disability; losing [employer] insurance 03/07/2010    Family History  Problem Relation Age of Onset  . Lung cancer Mother   . Hypertension Mother   . Stroke Mother   . Diabetes Mother   . Diabetes Father   . Hypertension Father   . Heart attack Father   . Asthma Father   . COPD Father   . Alcohol abuse Father   .  Diabetes Sister   . Hypertension Sister     ROS: no fevers or chills, productive cough, hemoptysis, dysphasia, odynophagia, melena, hematochezia, dysuria, hematuria, rash, seizure activity, orthopnea, PND, pedal edema, claudication. Remaining systems are negative.  Physical Exam: Well-developed well-nourished in no acute distress.  Skin is warm and dry.  HEENT is normal.  Neck is supple.  Chest is clear to auscultation with normal expansion.  Cardiovascular exam is regular rate and rhythm.  Abdominal exam nontender or distended. No masses palpated. Extremities show no edema. neuro grossly intact  ECG-sinus rhythm at a rate of 90.  No ST changes.  Personally reviewed  A/P  1 coronary artery disease-plan to continue aspirin.  Discontinue Plavix.  Patient is intolerant to statins.  2 chest pain-patient continues to have occasional chest pain.  Difficult situation.  Patient underwent cardiac catheterization previously and CTO was not felt to be a good option.  Coronary artery bypass and graft was also felt not to be a good option.  We will plan to continue medical therapy.  Continue amlodipine.  She did not tolerate nitrates, beta-blockade for Ranexa in the past.  3 hypertension-blood pressure is controlled.  Continue present medications.  Note patient did not tolerate increasing amlodipine from 2.5 mg  daily to 5 mg.  4 hyperlipidemia-patient is intolerant to statins and Zetia.  Laboratories from December 2018 personally reviewed.  Total cholesterol 206 with LDL of 131.  Will refer to lipid clinic for consideration of Repatha.  5 dilated aortic root-repeat CTA April 2019.  Kirk Ruths, MD

## 2017-05-02 ENCOUNTER — Encounter: Payer: Self-pay | Admitting: Cardiology

## 2017-05-02 ENCOUNTER — Ambulatory Visit: Payer: Medicare HMO | Admitting: Cardiology

## 2017-05-02 VITALS — BP 120/90 | HR 90 | Ht 65.0 in | Wt 184.0 lb

## 2017-05-02 DIAGNOSIS — I2510 Atherosclerotic heart disease of native coronary artery without angina pectoris: Secondary | ICD-10-CM

## 2017-05-02 DIAGNOSIS — E7800 Pure hypercholesterolemia, unspecified: Secondary | ICD-10-CM

## 2017-05-02 DIAGNOSIS — I10 Essential (primary) hypertension: Secondary | ICD-10-CM | POA: Diagnosis not present

## 2017-05-02 DIAGNOSIS — R072 Precordial pain: Secondary | ICD-10-CM

## 2017-05-02 NOTE — Patient Instructions (Signed)
Your physician wants you to follow-up in: 6 MONTHS WITH DR CRENSHAW You will receive a reminder letter in the mail two months in advance. If you don't receive a letter, please call our office to schedule the follow-up appointment.   If you need a refill on your cardiac medications before your next appointment, please call your pharmacy.  

## 2017-05-31 ENCOUNTER — Other Ambulatory Visit: Payer: Self-pay

## 2017-05-31 MED ORDER — AMLODIPINE BESYLATE 2.5 MG PO TABS
2.5000 mg | ORAL_TABLET | Freq: Every day | ORAL | 10 refills | Status: DC
Start: 2017-05-31 — End: 2017-06-02

## 2017-06-02 ENCOUNTER — Other Ambulatory Visit: Payer: Self-pay | Admitting: *Deleted

## 2017-06-02 MED ORDER — AMLODIPINE BESYLATE 2.5 MG PO TABS: 2.5000 mg | ORAL_TABLET | Freq: Every day | ORAL | 1 refills | Status: DC

## 2017-06-02 MED ORDER — SPIRONOLACTONE 25 MG PO TABS: 25.0000 mg | ORAL_TABLET | Freq: Every day | ORAL | 1 refills | Status: DC

## 2017-06-02 NOTE — Telephone Encounter (Signed)
REFILL 

## 2017-06-06 ENCOUNTER — Telehealth: Payer: Self-pay | Admitting: Family Medicine

## 2017-06-06 ENCOUNTER — Other Ambulatory Visit: Payer: Self-pay

## 2017-06-06 NOTE — Telephone Encounter (Signed)
Error. MPulliam, CMA/RT(R)  

## 2017-06-06 NOTE — Telephone Encounter (Deleted)
Patient called requesting a refill on levothyroxine.  Reviewed the chart and sent in refill. MPulliam, CMA/RT(R)

## 2017-06-06 NOTE — Telephone Encounter (Signed)
Patient called states she got ltr from Ins Co. --- Letter states they do not cover Brand name med without a letter, information as to why patient can't take generic form of :    levothyroxine (SYNTHROID) 175 MCG tablet [[id number]]   Order Details  Dose: 175 mcg Route: Oral Frequency: Daily  Dispense Quantity: 30 tablet Refills: 11 Fills remaining: --        Sig: Take 1 tablet (175 mcg total) by mouth daily.     Pt Ins Co is:  [employer] (HMO) at [phone]  --glh

## 2017-06-07 NOTE — Telephone Encounter (Signed)
Called [employer] and they stated that the Brand name is covered under her insurance. Patient notified. MPulliam, CMA/RT(R)

## 2017-06-15 DIAGNOSIS — D225 Melanocytic nevi of trunk: Secondary | ICD-10-CM | POA: Diagnosis not present

## 2017-06-15 DIAGNOSIS — L820 Inflamed seborrheic keratosis: Secondary | ICD-10-CM | POA: Diagnosis not present

## 2017-06-15 DIAGNOSIS — L570 Actinic keratosis: Secondary | ICD-10-CM | POA: Diagnosis not present

## 2017-06-15 DIAGNOSIS — D1801 Hemangioma of skin and subcutaneous tissue: Secondary | ICD-10-CM | POA: Diagnosis not present

## 2017-06-15 DIAGNOSIS — L821 Other seborrheic keratosis: Secondary | ICD-10-CM | POA: Diagnosis not present

## 2017-06-23 ENCOUNTER — Telehealth: Payer: Self-pay | Admitting: *Deleted

## 2017-06-23 DIAGNOSIS — I712 Thoracic aortic aneurysm, without rupture, unspecified: Secondary | ICD-10-CM

## 2017-06-23 NOTE — Telephone Encounter (Signed)
Spoke with pt, CTA of the chest to follow up on thoracic aneurysm 07-07-17 @ 8:30 am at the [address] office. Location and instructions for testing discussed in detail with the patient.

## 2017-07-07 ENCOUNTER — Ambulatory Visit (INDEPENDENT_AMBULATORY_CARE_PROVIDER_SITE_OTHER)
Admission: RE | Admit: 2017-07-07 | Discharge: 2017-07-07 | Disposition: A | Discharge status: Home / Self Care | Payer: Medicare HMO | Source: Ambulatory Visit | Attending: Cardiology | Admitting: Cardiology

## 2017-07-07 DIAGNOSIS — I712 Thoracic aortic aneurysm, without rupture, unspecified: Secondary | ICD-10-CM

## 2017-07-07 MED ORDER — IOPAMIDOL (ISOVUE-370) INJECTION 76%
75.0000 mL | Freq: Once | INTRAVENOUS | Status: AC | PRN
  Administered 2017-07-07: 75 mL via INTRAVENOUS

## 2017-07-11 ENCOUNTER — Encounter: Payer: Self-pay | Admitting: Family Medicine

## 2017-07-11 ENCOUNTER — Ambulatory Visit: Payer: Medicare HMO | Admitting: Family Medicine

## 2017-07-11 VITALS — BP 123/87 | HR 86 | Ht 65.0 in | Wt 183.7 lb

## 2017-07-11 DIAGNOSIS — J3089 Other allergic rhinitis: Secondary | ICD-10-CM | POA: Diagnosis not present

## 2017-07-11 DIAGNOSIS — R911 Solitary pulmonary nodule: Secondary | ICD-10-CM

## 2017-07-11 DIAGNOSIS — I2510 Atherosclerotic heart disease of native coronary artery without angina pectoris: Secondary | ICD-10-CM | POA: Diagnosis not present

## 2017-07-11 DIAGNOSIS — E7800 Pure hypercholesterolemia, unspecified: Secondary | ICD-10-CM

## 2017-07-11 DIAGNOSIS — J441 Chronic obstructive pulmonary disease with (acute) exacerbation: Secondary | ICD-10-CM

## 2017-07-11 DIAGNOSIS — E890 Postprocedural hypothyroidism: Secondary | ICD-10-CM

## 2017-07-11 DIAGNOSIS — F4323 Adjustment disorder with mixed anxiety and depressed mood: Secondary | ICD-10-CM

## 2017-07-11 NOTE — Patient Instructions (Signed)
Guidelines for a Low Cholesterol, Low Saturated Fat Diet ° °Fats °- Limit total intake of fats and oils. °- Avoid butter, stick margarine, shortening, lard, palm and coconut oils. °- Limit mayonnaise, salad dressings, gravies and sauces, unless they are homemade with low-fat ingredients. °- Limit chocolate. °- Choose low-fat and nonfat products, such as low-fat mayonnaise, low-fat or non-hydrogenated peanut butter, low-fat or fat-free salad dressings and nonfat gravy. °- Use vegetable oil, such as canola or olive oil. °- Look for margarine that does not contain trans fatty acids. °- Use nuts in moderate amounts. °- Read ingredient labels carefully to determine both amount and type of fat present in foods. Limit saturated and trans fats! °- Avoid high-fat processed and convenience foods. ° °Meats and Meat Alternatives °- Choose fish, chicken, turkey and lean meats. °- Use dried beans, peas, lentils and tofu. °- Limit egg yolks to three to four per week. °- If you eat red meat, limit to no more than three servings per week and choose loin or round cuts. °- Avoid fatty meats, such as bacon, sausage, franks, luncheon meats and ribs. °- Avoid all organ meats, including liver. ° °Dairy °- Choose nonfat or low-fat milk, yogurt and cottage cheese. °- Most cheeses are high in fat. Choose cheeses made from non-fat milk, such as mozzarella and ricotta cheese. °- Choose light or fat-free cream cheese and sour cream. °- Avoid cream and sauces made with cream. ° °Fruits and Vegetables °- Eat a wide variety of fruits and vegetables. °- Use lemon juice, vinegar or "mist" olive oil on vegetables. °- Avoid adding sauces, fat or oil to vegetables. ° °Breads, Cereals and Grains °- Choose whole-grain breads, cereals, pastas and rice. °- Avoid high-fat snack foods, such as granola, cookies, pies, pastries, doughnuts and croissants. ° °Cooking Tips °- Avoid deep fried foods. °- Trim visible fat off meats and remove skin from poultry  before cooking. °- Bake, broil, boil, poach or roast poultry, fish and lean meats. °- Drain and discard fat that drains out of meat as you cook it. °- Add little or no fat to foods. °- Use vegetable oil sprays to grease pans for cooking or baking. °- Steam vegetables. °- Use herbs or no-oil marinades to flavor foods. °Nine ways to increase your "good" HDL cholesterol ° °High-density lipoprotein (HDL) is often referred to as the "good" cholesterol. °Having high HDL levels helps carry cholesterol from your arteries to your liver, where it can be used or excreted. ° °Having high levels of HDL also has antioxidant and anti-inflammatory effects, and is linked to a reduced risk of heart disease (1, 2). ° °Most health experts recommend minimum blood levels of 40 mg/dl in men and 50 mg/dl in women. ° °While genetics definitely play a role, there are several other factors that affect HDL levels. ° °Here are nine healthy ways to raise your "good" HDL cholesterol. ° °1. Consume olive oil ° °two pieces of salmon on a plate °olive oil being poured into a small dish °Extra virgin olive oil may be more healthful than processed olive oils. °Olive oil is one of the healthiest fats around. ° °A large analysis of 42 studies with more than 800,000 participants found that olive oil was the only source of monounsaturated fat that seemed to reduce heart disease risk (3). ° °Research has shown that one of olive oil's heart-healthy effects is an increase in HDL cholesterol. This effect is thought to be caused by antioxidants it contains called polyphenols (  4, 5, 6, 7). ° °Extra virgin olive oil has more polyphenols than more processed olive oils, although the amount can still vary among different types and brands. ° °One study gave 200 healthy young men about 2 tablespoons (25 ml) of different olive oils per day for three weeks. ° °The researchers found that participants' HDL levels increased significantly more after they consumed the olive  oil with the highest polyphenol content (6). ° °In another study, when 62 older adults consumed about 4 tablespoons (50 ml) of high-polyphenol extra virgin olive oil every day for six weeks, their HDL cholesterol increased by 6.5 mg/dl, on average (7). ° °In addition to raising HDL levels, olive oil has been found to boost HDL's anti-inflammatory and antioxidant function in studies of older people and individuals with high cholesterol levels ( 7, 8, 9). ° °Whenever possible, select high-quality, certified extra virgin olive oils, which tend to be highest in polyphenols. ° °Bottom line: Extra virgin olive oil with a high polyphenol content has been shown to increase HDL levels in healthy people, the elderly and individuals with high cholesterol. ° °2. Follow a low-carb or ketogenic diet ° °Low-carb and ketogenic diets provide a number of health benefits, including weight loss and reduced blood sugar levels. ° °They have also been shown to increase HDL cholesterol in people who tend to have lower levels. ° °This includes those who are obese, insulin resistant or diabetic (10, 11, 12, 13, 14, 15, 16, 17). ° °In one study, people with type 2 diabetes were split into two groups. ° °One followed a diet consuming less than 50 grams of carbs per day. The other followed a high-carb diet. ° °Although both groups lost weight, the low-carb group's HDL cholesterol increased almost twice as much as the high-carb group's did (14). ° °In another study, obese people who followed a low-carb diet experienced an increase in HDL cholesterol of 5 mg/dl overall. ° °Meanwhile, in the same study, the participants who ate a low-fat, high-carb diet showed a decrease in HDL cholesterol (15). ° °This response may partially be due to the higher levels of fat people typically consume on low-carb diets. ° °One study in overweight women found that diets high in meat and cheese increased HDL levels by 5-8%, compared to a higher-carb diet  (18). ° °What's more, in addition to raising HDL cholesterol, very-low-carb diets have been shown to decrease triglycerides and improve several other risk factors for heart disease (13, 14, 16, 17). ° °Bottom line: Low-carb and ketogenic diets typically increase HDL cholesterol levels in people with diabetes, metabolic syndrome and obesity. ° °3. Exercise regularly ° °Being physically active is important for heart health. ° °Studies have shown that many different types of exercise are effective at raising HDL cholesterol, including strength training, high-intensity exercise and aerobic exercise (19, 20, 21, 22, 23, 24). ° °However, the biggest increases in HDL are typically seen with high-intensity exercise. ° °One small study followed women who were living with polycystic ovary syndrome (PCOS), which is linked to a higher risk of insulin resistance. The study required them to perform high-intensity exercise three times a week. ° °The exercise led to an increase in HDL cholesterol of 8 mg/dL after 10 weeks. The women also showed improvements in other health markers, including decreased insulin resistance and improved arterial function (23). ° °In a 12-week study, overweight men who performed high-intensity exercise experienced a 10% increase in HDL cholesterol. ° °In contrast, the low-intensity exercise group showed only a   2% increase and the endurance training group experienced no change (24). ° °However, even lower-intensity exercise seems to increase HDL's anti-inflammatory and antioxidant capacities, whether or not HDL levels change (20, 21, 25). ° °Overall, high-intensity exercise such as high-intensity interval training (HIIT) and high-intensity circuit training (HICT) may boost HDL cholesterol levels the most. ° °Bottom line: Exercising several times per week can help raise HDL cholesterol and enhance its anti-inflammatory and antioxidant effects. High-intensity forms of exercise may be especially  effective. ° °4. Add coconut oil to your diet ° °Studies have shown that coconut oil may reduce appetite, increase metabolic rate and help protect brain health, among other benefits. ° °Some people may be concerned about coconut oil's effects on heart health due to its high saturated fat content. ° °However, it appears that coconut oil is actually quite heart healthy. ° °Coconut oil tends to raise HDL cholesterol more than many other types of fat. ° °In addition, it may improve the ratio of low-density-lipoprotein (LDL) cholesterol, the "bad" cholesterol, to HDL cholesterol. Improving this ratio reduces heart disease risk (26, 27, 28, 29). ° °One study examined the health effects of coconut oil on 40 women with excess belly fat. The researchers found that participants who took coconut oil daily experienced increased HDL cholesterol and a lower LDL-to-HDL ratio. ° °In contrast, the group who took soybean oil daily had a decrease in HDL cholesterol and an increase in the LDL-to-HDL ratio (29). ° °Most studies have found these health benefits occur at a dosage of about 2 tablespoons (30 ml) of coconut oil per day. It's best to incorporate this into cooking rather than eating spoonfuls of coconut oil on their own. ° °Bottom line: Consuming 2 tablespoons (30 ml) of coconut oil per day may help increase HDL cholesterol levels. ° °5. Stop smoking ° °cigarette butt °Quitting smoking can reduce the risk of heart disease and lung cancer. °Smoking increases the risk of many health problems, including heart disease and lung cancer (30). ° °One of its negative effects is a suppression of HDL cholesterol. ° °Some studies have found that quitting smoking can increase HDL levels. Indeed, one study found no significant differences in HDL levels between former smokers and people who had never smoked (31, 32, 33, 34, 35). ° °In a one-year study of more than 1,500 people, those who quit smoking had twice the increase in HDL as those  who resumed smoking within the year. The number of large HDL particles also increased, which further reduced heart disease risk (32). ° °One study followed smokers who switched from traditional cigarettes to electronic cigarettes for one year. They found that the switch was associated with an increase in HDL cholesterol of 5 mg/dl, on average (33). ° °When it comes to the effect of nicotine replacement patches on HDL levels, research results have been mixed. ° °One study found that nicotine replacement therapy led to higher HDL cholesterol. However, other research suggests that people who use nicotine patches likely won't see increases in HDL levels until after replacement therapy is completed (34, 36). ° °Even in studies where HDL cholesterol levels didn't increase after people quit smoking, HDL function improved, resulting in less inflammation and other beneficial effects on heart health (37). ° °Bottom line: Quitting smoking can increase HDL levels, improve HDL function and help protect heart health. ° °6. Lose weight ° °When overweight and obese people lose weight, their HDL cholesterol levels usually increase. ° °What's more, this benefit seems to occur whether weight   loss is achieved by calorie counting, carb restriction, intermittent fasting, weight loss surgery or a combination of diet and exercise (16, 38, 39, 40, 41, 42). ° °One study examined HDL levels in more than 3,000 overweight and obese [place] adults who followed a lifestyle modification program for one year. ° °The researchers found that losing at least 6.6 lbs (3 kg) led to an increase in HDL cholesterol of 4 mg/dl, on average (41). ° °In another study, when obese people with type 2 diabetes consumed calorie-restricted diets that provided 20-30% of calories from protein, they experienced significant increases in HDL cholesterol levels (42). ° °The key to achieving and maintaining healthy HDL cholesterol levels is choosing the type of diet that  makes it easiest for you to lose weight and keep it off. ° °Bottom Line: Several methods of weight loss have been shown to increase HDL cholesterol levels in people who are overweight or obese. ° °7. Choose purple produce ° °Consuming purple-colored fruits and vegetables is a delicious way to potentially increase HDL cholesterol. ° °Purple produce contains antioxidants known as anthocyanins. ° °Studies using anthocyanin extracts have shown that they help fight inflammation, protect your cells from damaging free radicals and may also raise HDL cholesterol levels (43, 44, 45, 46). ° °In a 24-week study of 58 people with diabetes, those who took an anthocyanin supplement twice a day experienced a 19% increase in HDL cholesterol, on average, along with other improvements in heart health markers (45). ° °In another study, when people with cholesterol issues took anthocyanin extract for 12 weeks, their HDL cholesterol levels increased by 13.7% (46). ° °Although these studies used extracts instead of foods, there are several fruits and vegetables that are very high in anthocyanins. These include eggplant, purple corn, red cabbage, blueberries, blackberries and black raspberries. ° °Bottom line: Consuming fruits and vegetables rich in anthocyanins may help increase HDL cholesterol levels. ° °8. Eat fatty fish often ° °The omega-3 fats in fatty fish provide major benefits to heart health, including a reduction in inflammation and better functioning of the cells that line your arteries (47, 48). ° °There's some research showing that eating fatty fish or taking fish oil may also help raise low levels of HDL cholesterol (49, 50, 51, 52, 53). ° °In a study of 33 heart disease patients, participants that consumed fatty fish four times per week experienced an increase in HDL cholesterol levels. The particle size of their HDL also increased (52). ° °In another study, overweight men who consumed herring five days a week for six  weeks had a 5% increase in HDL cholesterol, compared with their levels after eating lean pork and chicken five days a week (53). ° °However, there are a few studies that found no increase in HDL cholesterol in response to increased fish or omega-3 supplement intake (54, 55). ° °In addition to herring, other types of fatty fish that may help raise HDL cholesterol include salmon, sardines, mackerel and anchovies. ° °Bottom line: Eating fatty fish several times per week may help increase HDL cholesterol levels and provide other benefits to heart health. ° °9. Avoid artificial trans fats ° °Artificial trans fats have many negative health effects due to their inflammatory properties (56, 57). ° °There are two types of trans fats. One kind occurs naturally in animal products, including full-fat dairy. ° °In contrast, the artificial trans fats found in margarines and processed foods are created by adding hydrogen to unsaturated vegetable and seed oils. These fats are also   known as industrial trans fats or partially hydrogenated fats. ° °Research has shown that, in addition to increasing inflammation and contributing to several health problems, these artificial trans fats may lower HDL cholesterol levels. ° °In one study, researchers compared how people's HDL levels responded when they consumed different margarines. ° °The study found that participants' HDL cholesterol levels were 10% lower after consuming margarine containing partially hydrogenated soybean oil, compared to their levels after consuming palm oil (58). ° °Another controlled study followed 40 adults who had diets high in different types of trans fats. ° °They found that HDL cholesterol levels in women were significantly lower after they consumed the diet high in industrial trans fats, compared to the diet containing naturally occurring trans fats (59). ° °To protect heart health and keep HDL cholesterol in the healthy range, it's best to avoid artificial trans  fats altogether. ° °Bottom line: Artificial trans fats have been shown to lower HDL levels and increase inflammation, compared to other fats. ° °Take home message ° °Although your HDL cholesterol levels are partly determined by your genetics, there are many things you can do to naturally increase your own levels. ° °Fortunately, the practices that raise HDL cholesterol often provide other health benefits as well. ° °

## 2017-07-11 NOTE — Progress Notes (Signed)
Impression and Recommendations:    1. Pure hypercholesterolemia   2. Coronary artery disease involving native coronary artery of native heart without angina pectoris   3. Postoperative hypothyroidism   4. Adjustment disorder with mixed anxiety and depressed mood   5. COPD with acute exacerbation ([place])   6. Lung nodule: benign-appearing, screening CT each December   7. Environmental and seasonal allergies-on Zyrtec, has reactive airway disease component seasonally     1. Blood Pressure - Continue on medications as prescribed.  Patient tolerates current doses well.  - Ambulatory BP monitoring encouraged.  Keep log and bring in next OV.  2. Coronary Artery Disease - Stable, asymptomatic.  - LDL a year ago was 115, and 5 months ago, 131.   - Goal reviewed as LDL under 100.  - Continue additional care through cardiology.  3. Postoperative Hypothyroidism - Currently stable, asymptomatic.  - Last labs December 2018 showed low TSH but patient remains stable.    - Will re-check TSH, T3, T4 today.  4. COPD/Reactive Airway Disease (Seasonally) - RAD stable at present.  - Patient cannot take Singulair because it gives her cardiac side-effects (heart racing).  Per patient regarding side-effects, "if it's good for my breathing it's bad for my heart, and vice versa."  - Continue Zyrtec every day.  - Advised the patient to use AYR or Neilmed sinus rinses BID followed by flonase BID (one spray to each nostril).  Advised that the patient may also incorporate allegra or claritin PRN.   - Advised that patient should only be using her albuterol 2 times a week max, as a rescue inhaler.  If she finds herself using it more than prescribed, we will look into additional medication.  Lung nodule: benign-appearing, screening CT each December - Recent CT screen (02/23/2017) with benign appearing lesions which require one year follow-up.  Told patient to put in her calendar so she will  remember this every early December.  - Continue care through pulmonology.  5. Mood - Stable at this time.  Continue medications as prescribed.   6. Exercise & Health Maintenance - Advised patient to continue exercising to improve her overall health, including blood pressure, cholesterol, and mood.  - Patient will continue with an hour of activity 5 days weekly.  Recommended that the patient eventually strive for at least 150 minutes of moderate cardiovascular activity per week according to guidelines established by the [facility].   - Healthy dietary habits encouraged, including low-carb, and high amounts of lean protein in diet.   - Patient should also consume adequate amounts of water - half of body weight in oz of water per day  7. Follow-Up - Routine blood work will be drawn as part of her yearly physical this coming December.   Orders Placed This Encounter  Procedures  . TSH  . T4, free  . T3, free     Gross side effects, risk and benefits, and alternatives of medications and treatment plan in general discussed with patient.  Patient is aware that all medications have potential side effects and we are unable to predict every side effect or drug-drug interaction that may occur.   Patient will call with any questions prior to using medication if they have concerns.  Expresses verbal understanding and consents to current therapy and treatment regimen.  No barriers to understanding were identified.  Red flag symptoms and signs discussed in detail.  Patient expressed understanding regarding what to do in case of emergency\urgent  symptoms  Please see AVS handed out to patient at the end of our visit for further patient instructions/ counseling done pertaining to today's office visit.   Return for Follow-up 3-4 months..    Note: This note was prepared with assistance of Dragon voice recognition software. Occasional wrong-word or sound-a-like substitutions may have occurred due to the  inherent limitations of voice recognition software.  This document serves as a record of services personally performed by Mellody Dance, DO. It was created on her behalf by Quantisha Amend, a trained medical scribe. The creation of this record is based on the scribe's personal observations and the provider's statements to them.   I have reviewed the above medical documentation for accuracy and completeness and I concur.  Mellody Dance 07/11/17 12:34 PM   ----------------------------------------------------------------------------------------------------------------------------------------------   Subjective:     HPI: Amy Hodge is a 62 y.o. female who presents to [place] at [facility] today for issues as discussed below.  Blood Pressure Does not check her blood pressure at home.  Denies dizziness lightheadedness other than a "time or two," "nothing to be concerned over."  Continues on medications as prescribed.  Exercise Habits Does the elliptical machine for 30 minutes each time, moving over a mile, then rides the stationary bicycle for 6 miles, which takes her 30 more minutes.  Trying to get faster each time.  Also works out in the yard.  Does her cardio workout at least 5 days per week.  Is trying to lose weight.  her goal weight is in the 160's.  Cardiology Her aneurysm has not grown.  Just went for CT scan, and everything is stable.  Notes that she returns again for follow-up next year.  Dr. Stanford Breed wanted to put her on a higher dose of amlodipine, but patient notes "It makes me feel so bad; it drags me out."  Notes that it really makes her dizzy, to the point of coming close to fainting.  Mood Patient is doing very well on Wellbutrin and Cymbalta.  Notes that her mood is good.    She feels "good enough to get out and exercise."  Headaches are well-controlled at this time.  Cholesterol Notes that Dr. Stanford Breed wanted her to go to a study.   She is checked up on every 3 months.  Working on this with diet and exercise.  Seasonal Allergies/COPD/Reactive Airway Disease Sometimes she takes albuterol when she wakes up coughing at night. Notices that if she's outside a lot during the day, the coughing tends to hit her later on.  Currently using the albuterol 3-4 times per week, at one puff each time.  She takes zyrtec every day.  Follows up with Dr. Annamaria Boots through pulmonology.  Has talked about using a steroid before, but does not at present.   Depression screen [facility] 2/9 03/30/2017 02/06/2017 01/11/2017 12/30/2016 12/12/2016  Decreased Interest 0 0 0 0 0  Down, Depressed, Hopeless 0 0 0 0 0  PHQ - 2 Score 0 0 0 0 0  Altered sleeping 0 0 0 0 -  Tired, decreased energy 0 0 0 0 -  Change in appetite 0 0 0 0 -  Feeling bad or failure about yourself  0 0 0 0 -  Trouble concentrating 0 0 0 0 -  Moving slowly or fidgety/restless 0 0 0 0 -  Suicidal thoughts 0 0 0 0 -  PHQ-9 Score 0 0 0 0 -  Difficult doing work/chores Not difficult at  all Not difficult at all - Not difficult at all -       Wt Readings from Last 3 Encounters:  07/11/17 183 lb 11.2 oz (83.3 kg)  05/02/17 184 lb (83.5 kg)  03/30/17 187 lb 12.8 oz (85.2 kg)   BP Readings from Last 3 Encounters:  07/11/17 123/87  05/02/17 120/90  03/30/17 118/84   Pulse Readings from Last 3 Encounters:  07/11/17 86  05/02/17 90  03/30/17 84   BMI Readings from Last 3 Encounters:  07/11/17 30.57 kg/m  05/02/17 30.62 kg/m  03/30/17 31.25 kg/m     Patient Care Team    Relationship Specialty Notifications Start End  Mellody Dance, DO PCP - General Family Medicine  07/05/16   Lelon Perla, MD Consulting Physician Cardiology  05/10/16   Deneise Lever, MD Consulting Physician Pulmonary Disease  05/10/16   Roseanne Kaufman, MD Consulting Physician Orthopedic Surgery  05/10/16   Garlan Fair, MD Consulting Physician Gastroenterology  05/10/16      Patient  Active Problem List   Diagnosis Date Noted  . Environmental and seasonal allergies-on Zyrtec, has reactive airway disease component seasonally 07/11/2017  . Lung nodule: benign-appearing, screening CT each December 07/11/2017  . Senile solar keratosis 02/06/2017  . COPD with acute exacerbation ([place]) 01/11/2017  . S/P total hysterectomy and bilateral salpingo-oophorectomy 12/30/2016  . Adjustment disorder with mixed anxiety and depressed mood 08/02/2016  . Myalgia/ generalized fibromyalgia 08/02/2016  . ACS (acute coronary syndrome) 06/28/2016  . Crescendo angina ([place]) 06/28/2016  . Insomnia secondary to anxiety 05/10/2016  . Cough 04/28/2016  . Obstructive sleep apnea 08/26/2015  . Shingles rash 08/04/2015  . Allergic reaction 06/27/2015  . History of hemoptysis 06/27/2015  . Stented coronary artery 02/26/2015  . Nocturnal hypoxemia 02/26/2015  . Hemoptysis 10/24/2014  . Centrilobular emphysema ([place]) 06/24/2014  . Soft tissue injury of multiple sites/bilateral hands 05/16/2013  . Bite from dog 05/15/2013  . Chest pain 05/15/2013  . Coronary artery disease 04/08/2013  . COPD exacerbation ([place]) 12/31/2010  . Health care maintenance 12/25/2010  . MENOPAUSAL DISORDER 02/16/2010  . Vitamin D deficiency 10/07/2009  . Chronic pain syndrome 02/20/2009  . COMMON MIGRAINE 02/20/2009  . OSTEOARTHRITIS, KNEE, RIGHT 02/20/2009  . BACK PAIN 11/27/2008  . Fatigue- chronic 07/15/2008  . Memory loss 07/15/2008  . PERIPHERAL EDEMA 07/15/2008  . KNEE PAIN, BILATERAL 09/05/2007  . Hyperlipidemia- intol to statins 06/14/2007  . Seasonal and perennial allergic rhinitis 06/14/2007  . COPD mixed type ([place]) 06/14/2007  . post-operative Hypothyroidism 10/06/2006  . Anxiety state 10/06/2006  . Depression 10/06/2006  . GERD 10/06/2006    Past Medical history, Surgical history, Family history, Social history, Allergies and Medications have been entered into the medical record, reviewed and changed  as needed.    Current Meds  Medication Sig  . acetaminophen (TYLENOL) 500 MG tablet Take 1,000 mg by mouth every 8 (eight) hours as needed (for pain or headahces).   Marland Kitchen albuterol (PROVENTIL HFA;VENTOLIN HFA) 108 (90 BASE) MCG/ACT inhaler Inhale 2 puffs into the lungs every 6 (six) hours as needed for wheezing or shortness of breath.  Marland Kitchen amitriptyline (ELAVIL) 25 MG tablet Take 2 tablets (50 mg total) by mouth at bedtime.  Marland Kitchen amLODipine (NORVASC) 2.5 MG tablet Take 1 tablet (2.5 mg total) by mouth daily.  Marland Kitchen aspirin EC 81 MG tablet Take 81 mg by mouth daily.  Marland Kitchen buPROPion (WELLBUTRIN XL) 150 MG 24 hr tablet Take 1 tablet (150 mg total) by  mouth daily.  . cetirizine (ZYRTEC) 10 MG tablet Take 1 tablet (10 mg total) by mouth daily.  . DULoxetine (CYMBALTA) 20 MG capsule Take 1 capsule (20 mg total) by mouth daily.  Marland Kitchen levothyroxine (SYNTHROID) 175 MCG tablet Take 1 tablet (175 mcg total) by mouth daily.  . nitroGLYCERIN (NITROSTAT) 0.4 MG SL tablet Place 0.4 mg under the tongue every 5 (five) minutes as needed for chest pain.  . pantoprazole (PROTONIX) 20 MG tablet Take 2 tablets (40 mg total) by mouth daily.  Marland Kitchen spironolactone (ALDACTONE) 25 MG tablet Take 1 tablet (25 mg total) by mouth daily.  . Vitamin D, Ergocalciferol, (DRISDOL) 50000 units CAPS capsule Take 1 capsule (50,000 Units total) by mouth every 7 (seven) days.    Allergies:  Allergies  Allergen Reactions  . Bactrim [Sulfamethoxazole-Trimethoprim] Shortness Of Breath, Anxiety and Palpitations  . Ketorolac Shortness Of Breath  . Morphine Other (See Comments)    headache  . Tetracyclines & Related Itching  . Valsartan Other (See Comments) and Cough    Headaches also   . Bystolic [Nebivolol Hcl] Other (See Comments)    Fatigue- Tired feeling  . Isosorbide Other (See Comments)    HEADACHES   . Levofloxacin Hives  . Lisinopril Cough  . Metoprolol Other (See Comments)    FATIGUE  . Morphine And Related Other (See Comments)     headache  . Simvastatin Other (See Comments)    Makes legs hurt/ sore   . Statins Other (See Comments)    MYALGIAS  . Tetracycline Hives and Itching  . Toradol [Ketorolac Tromethamine] Palpitations     Review of Systems:  A fourteen system review of systems was performed and found to be positive as per HPI.   Objective:   Blood pressure 123/87, pulse 86, height 5\' 5"  (1.651 m), weight 183 lb 11.2 oz (83.3 kg), SpO2 96 %. Body mass index is 30.57 kg/m. General:  Well Developed, well nourished, appropriate for stated age.  Neuro:  Alert and oriented,  extra-ocular muscles intact  HEENT:  Normocephalic, atraumatic, neck supple, no carotid bruits appreciated  Skin:  no gross rash, warm, pink. Cardiac:  RRR, S1 S2 Respiratory:  ECTA B/L and A/P, Not using accessory muscles, speaking in full sentences- unlabored. Vascular:  Ext warm, no cyanosis apprec.; cap RF less 2 sec. Psych:  No HI/SI, judgement and insight good, Euthymic mood. Full Affect.

## 2017-07-26 ENCOUNTER — Other Ambulatory Visit: Payer: Self-pay

## 2017-07-26 DIAGNOSIS — R5382 Chronic fatigue, unspecified: Secondary | ICD-10-CM

## 2017-07-26 DIAGNOSIS — G894 Chronic pain syndrome: Secondary | ICD-10-CM

## 2017-07-26 DIAGNOSIS — F4323 Adjustment disorder with mixed anxiety and depressed mood: Secondary | ICD-10-CM

## 2017-07-26 DIAGNOSIS — M791 Myalgia, unspecified site: Secondary | ICD-10-CM

## 2017-07-26 MED ORDER — DULOXETINE HCL 20 MG PO CPEP: 20.0000 mg | ORAL_CAPSULE | Freq: Every day | ORAL | 1 refills | Status: DC

## 2017-07-26 NOTE — Telephone Encounter (Signed)
Refill request for Duloxetine 20mg  .  Chart reviewed refill sent to pharmacy. MPulliam, CMA/RT(R)'

## 2017-08-09 DIAGNOSIS — H524 Presbyopia: Secondary | ICD-10-CM | POA: Diagnosis not present

## 2017-08-17 ENCOUNTER — Telehealth: Payer: Self-pay | Admitting: Family Medicine

## 2017-08-17 NOTE — Telephone Encounter (Signed)
Patient states she has "the crud" and is requesting a refill of the cough meds she was given for the same situation last year. Please advise and if approved please send to [facility].

## 2017-08-18 ENCOUNTER — Telehealth: Payer: Self-pay | Admitting: Family Medicine

## 2017-08-18 NOTE — Telephone Encounter (Signed)
Patient states that she is still cough at night and that it is keeping her up.  Patient is aware that the is no provider in the office until Monday and that this will have to be reviewed and approved by a provider.  Please review and advise. MPulliam, CMA/RT(R)

## 2017-08-18 NOTE — Telephone Encounter (Signed)
Called patient and advised that there are no providers in the office until Monday 08/21/2017 and that this medication has to be sent into by the provider due to it being a controled substance.  Sent request to Dr. Raliegh Scarlet to review. MPulliam, CMA/RT(R)

## 2017-08-18 NOTE — Telephone Encounter (Signed)
Patient request provider call in a refill on :  chlorpheniramine-HYDROcodone (TUSSIONEX) 10-8 MG/5ML SUER [[id number]] DISCONTINUED   Order Details  Dose: 5 mL Route: Oral Frequency: Every 12 hours PRN for cough, cough, will cause drowsiness.  Indications of Use: Cough and Nasal Congestion     Please send Rx refill to: [place], [place] [facility] [place] [phone] (Phone) [phone] (Fax)   --forwarding message to medical assistant.  -glh

## 2017-08-21 NOTE — Telephone Encounter (Signed)
Patient is coming in 08/22/17. MPulliam, CMA/RT(R)

## 2017-08-21 NOTE — Telephone Encounter (Signed)
Last time I saw her was not even for an acute appointment.  If she still has cough, then she really should be seen

## 2017-08-22 ENCOUNTER — Encounter: Payer: Self-pay | Admitting: Family Medicine

## 2017-08-22 ENCOUNTER — Ambulatory Visit (INDEPENDENT_AMBULATORY_CARE_PROVIDER_SITE_OTHER): Payer: Medicare HMO | Admitting: Family Medicine

## 2017-08-22 VITALS — BP 129/89 | HR 82 | Temp 98.4°F | Ht 65.0 in | Wt 189.8 lb

## 2017-08-22 DIAGNOSIS — J440 Chronic obstructive pulmonary disease with acute lower respiratory infection: Secondary | ICD-10-CM | POA: Diagnosis not present

## 2017-08-22 DIAGNOSIS — J209 Acute bronchitis, unspecified: Secondary | ICD-10-CM | POA: Diagnosis not present

## 2017-08-22 DIAGNOSIS — R05 Cough: Secondary | ICD-10-CM | POA: Diagnosis not present

## 2017-08-22 DIAGNOSIS — R058 Other specified cough: Secondary | ICD-10-CM

## 2017-08-22 DIAGNOSIS — J432 Centrilobular emphysema: Secondary | ICD-10-CM

## 2017-08-22 MED ORDER — PREDNISONE 20 MG PO TABS: ORAL_TABLET | ORAL | 0 refills | Status: DC

## 2017-08-22 MED ORDER — AZITHROMYCIN 250 MG PO TABS: 250.0000 mg | ORAL_TABLET | Freq: Every day | ORAL | 0 refills | Status: DC

## 2017-08-22 MED ORDER — HYDROCOD POLST-CPM POLST ER 10-8 MG/5ML PO SUER: 5.0000 mL | Freq: Two times a day (BID) | ORAL | 0 refills | Status: DC | PRN

## 2017-08-22 NOTE — Patient Instructions (Signed)

## 2017-08-22 NOTE — Progress Notes (Signed)
Acute Care Office visit  Assessment and plan:  1. Acute bronchitis with COPD ([place])   2. Centrilobular emphysema ([place])   3. Productive cough    1. Productive cough/acute bronchitis with COPD/centrilobular emphysema/ -start abx -start tussionex -start prednisone -use rescue inhaler prn for SOB/Wheeze -push fluid, get plenty of rest.   - Viral vs Allergic vs Bacterial causes for pt's symptoms reveiwed.    - Supportive care and various OTC medications discussed in addition to any prescribed. - Call or RTC if new symptoms, or if no improvement or worse over next several days.     Meds ordered this encounter  Medications  . azithromycin (ZITHROMAX) 250 MG tablet    Sig: Take 1 tablet (250 mg total) by mouth daily. Take first 2 tablets together, then 1 every day until finished.    Dispense:  6 tablet    Refill:  0  . predniSONE (DELTASONE) 20 MG tablet    Sig: Take 3 pills a day for 2 days, 2 pills a day for 2 days, 1 pill a day for 2 days then one half pill a day for 2 days then off    Dispense:  14 tablet    Refill:  0  . chlorpheniramine-HYDROcodone (TUSSIONEX) 10-8 MG/5ML SUER    Sig: Take 5 mLs by mouth every 12 (twelve) hours as needed for cough (cough, will cause drowsiness.).    Dispense:  200 mL    Refill:  0    No orders of the defined types were placed in this encounter.   Gross side effects, risk and benefits, and alternatives of medications discussed with patient.  Patient is aware that all medications have potential side effects and we are unable to predict every sideeffect or drug-drug interaction that may occur.  Expresses verbal understanding and consents to current therapy plan and treatment regiment.   Education and routine counseling performed. Handouts provided.  Anticipatory guidance and routine counseling done re: condition, txmnt options and need for follow up. All questions of patient's were answered.  Return if symptoms worsen or fail to  improve, for Also follow-up for chronic care as previously discussed.  Please see AVS handed out to patient at the end of our visit for additional patient instructions/ counseling done pertaining to today's office visit.  Note: This document was partially repared using Dragon voice recognition software and may include unintentional dictation errors.  This document serves as a record of services personally performed by Mellody Dance, DO. It was created on her behalf by Mayer Masker, a trained medical scribe. The creation of this record is based on the scribe's personal observations and the provider's statements to them.   I have reviewed the above medical documentation for accuracy and completeness and I concur.  Mellody Dance 08/22/17 9:39 AM    Subjective:    Chief Complaint  Patient presents with  . Cough    HPI:  Pt presents with Sx for 7 days   C/o: productive (thick, green and white) cough, Sinus congestion, HA, ear pain, sore throat, post-nasal drip, SOB, bilateral rib pain when she coughs, wheezing, subjective fever, chills,   Denies: one-sided face pain,     For symptoms patient has tried:  neti pot, tussionex, sudafed D, mucinex DM, robitussin   Overall getting:   Worse.  Pt has an extensive smoking history. She has also been under a lot of stress recently.  Patient Care Team    Relationship Specialty Notifications Start End  Mellody Dance, DO PCP - General Family Medicine  07/05/16   Lelon Perla, MD Consulting Physician Cardiology  05/10/16   Deneise Lever, MD Consulting Physician Pulmonary Disease  05/10/16   Roseanne Kaufman, MD Consulting Physician Orthopedic Surgery  05/10/16   Garlan Fair, MD Consulting Physician Gastroenterology  05/10/16     Past medical history, Surgical history, Family history reviewed and noted below, Social history, Allergies, and Medications have been entered into the medical record, reviewed and changed as needed.    Allergies  Allergen Reactions  . Bactrim [Sulfamethoxazole-Trimethoprim] Shortness Of Breath, Anxiety and Palpitations  . Ketorolac Shortness Of Breath  . Morphine Other (See Comments)    headache  . Tetracyclines & Related Itching  . Valsartan Other (See Comments) and Cough    Headaches also   . Bystolic [Nebivolol Hcl] Other (See Comments)    Fatigue- Tired feeling  . Isosorbide Other (See Comments)    HEADACHES   . Levofloxacin Hives  . Lisinopril Cough  . Metoprolol Other (See Comments)    FATIGUE  . Morphine And Related Other (See Comments)    headache  . Simvastatin Other (See Comments)    Makes legs hurt/ sore   . Singulair [Montelukast Sodium] Palpitations  . Statins Other (See Comments)    MYALGIAS  . Tetracycline Hives and Itching  . Toradol [Ketorolac Tromethamine] Palpitations    Review of Systems: - see above HPI for pertinent positives General:   No F/C, wt loss Pulm:   No DIB, pleuritic chest pain Card:  No CP, palpitations Abd:  No n/v/d or pain Ext:  No inc edema from baseline   Objective:   Blood pressure 129/89, pulse 82, temperature 98.4 F (36.9 C), height 5\' 5"  (1.651 m), weight 189 lb 12.8 oz (86.1 kg), SpO2 96 %. Body mass index is 31.58 kg/m. General: Well Developed, well nourished, appropriate for stated age.  Neuro: Alert and oriented x3, extra-ocular muscles intact, sensation grossly intact.  HEENT: Normocephalic, atraumatic, pupils equal round reactive to light, neck supple, no masses, no painful lymphadenopathy, TM's intact B/L, no acute findings. Nares- patent, clear d/c, OP- clear, mild erythema, No TTP sinuses Skin: Warm and dry, no gross rash. Cardiac: RRR, S1 S2,  no murmurs rubs or gallops.  Respiratory: Not using accessory muscles, speaking in full sentences- unlabored.  On forceful exhalation she had bilateral occasional rhonchi and scant wheeze  Vascular:  No gross lower ext edema, cap RF less 2 sec. Psych: No HI/SI,  judgement and insight good, Euthymic mood. Full Affect.

## 2017-09-12 ENCOUNTER — Other Ambulatory Visit: Payer: Self-pay | Admitting: Cardiology

## 2017-09-12 ENCOUNTER — Other Ambulatory Visit: Payer: Self-pay | Admitting: Family Medicine

## 2017-09-12 DIAGNOSIS — F4323 Adjustment disorder with mixed anxiety and depressed mood: Secondary | ICD-10-CM

## 2017-10-24 NOTE — Progress Notes (Signed)
HPI: [id number]. Previously followed by Dr Einar Gip. Pt had NSTEMI 9/83 with complicated PCI of RCA (performed in [facility]). Echo 3/15 showed normal LV function, mild LVH, grade 1 DD, mild RVE. ABI 12/15 normal.Cath 6/16 showed 100 PDA, patent stents in proximal and mid RCA, no obstructive disease in the left system; collaterals to the PDA noted; PTCA of CTO unsuccessful.Last cath 10/17 showed 100 PDA (filled with left to right collaterals), patent RCA stent and normal LV function. H/O cough with ACEI. Myalgias with crestor.Dr Martinique reviewed and did not think attempt at CTO intervention or CABG was appropriate. CTA May 2019 showed stable 4 cm ascending thoracic aortic aneurysm.  Since last seen, she continues to have occasional chest pain both at rest and with exertion.  It is under her left breast radiating to the substernal area and left ear.  Last 1 to 2 minutes and resolves.  No syncope.  No dyspnea.  Current Outpatient Medications  Medication Sig Dispense Refill  . acetaminophen (TYLENOL) 500 MG tablet Take 1,000 mg by mouth every 8 (eight) hours as needed (for pain or headahces).     Marland Kitchen albuterol (PROVENTIL HFA;VENTOLIN HFA) 108 (90 BASE) MCG/ACT inhaler Inhale 2 puffs into the lungs every 6 (six) hours as needed for wheezing or shortness of breath.    Marland Kitchen amitriptyline (ELAVIL) 25 MG tablet TAKE 2 TABLETS BY MOUTH AT BEDTIME 180 tablet 1  . amLODipine (NORVASC) 2.5 MG tablet Take 1 tablet (2.5 mg total) by mouth daily. 90 tablet 1  . aspirin EC 81 MG tablet Take 81 mg by mouth daily.    Marland Kitchen buPROPion (WELLBUTRIN XL) 150 MG 24 hr tablet TAKE 1 TABLET (150 MG TOTAL) BY MOUTH DAILY. 90 tablet 0  . cetirizine (ZYRTEC) 10 MG tablet Take 1 tablet (10 mg total) by mouth daily. 90 tablet 1  . chlorpheniramine-HYDROcodone (TUSSIONEX) 10-8 MG/5ML SUER Take 5 mLs by mouth every 12 (twelve) hours as needed for cough (cough, will cause drowsiness.). 200 mL 0  . Cholecalciferol (VITAMIN D3) 5000 units CAPS Take 1  capsule by mouth daily.    . DULoxetine (CYMBALTA) 20 MG capsule Take 1 capsule (20 mg total) by mouth daily. 90 capsule 1  . levothyroxine (SYNTHROID) 175 MCG tablet Take 1 tablet (175 mcg total) by mouth daily. 30 tablet 11  . nitroGLYCERIN (NITROSTAT) 0.4 MG SL tablet Place 0.4 mg under the tongue every 5 (five) minutes as needed for chest pain.    . pantoprazole (PROTONIX) 20 MG tablet Take 2 tablets (40 mg total) by mouth daily. 90 tablet 3  . spironolactone (ALDACTONE) 25 MG tablet TAKE 1 TABLET (25 MG TOTAL) BY MOUTH DAILY. 90 tablet 1   No current facility-administered medications for this visit.      Past Medical History:  Diagnosis Date  . Allergy   . Anxiety   . COPD (chronic obstructive pulmonary disease) ([place])   . Coronary artery disease    a. s/p PCI of RCA in 2015 b. cath in 12/2015 showed 100% Ost RPDA with collaterals present  . DDD (degenerative disc disease), cervical   . Depression   . DJD (degenerative joint disease) of knee    RT  . GERD (gastroesophageal reflux disease)   . Hashimoto's disease   . Headache(784.0)   . Hyperlipidemia   . Hypertension   . Hypothyroidism   . Myocardial infarction ([place])   . Nocturnal hypoxemia 02/26/2015  . On supplemental oxygen therapy    Oxygen  2.5 l/m at bedtime.  . Stented coronary artery 02/26/2015    Past Surgical History:  Procedure Laterality Date  . ABDOMINAL HYSTERECTOMY    . APPENDECTOMY  1971  . BUNIONECTOMY Right   . CARDIAC CATHETERIZATION N/A 09/02/2014   Procedure: Left Heart Cath and Coronary Angiography;  Surgeon: Adrian Prows, MD;  Location: [facility];  Service: Cardiovascular;  Laterality: N/A;  . CARDIAC CATHETERIZATION N/A 12/15/2015   Procedure: Left Heart Cath and Coronary Angiography;  Surgeon: Jettie Booze, MD;  Location: [facility];  Service: Cardiovascular;  Laterality: N/A;  . CERVICAL FUSION    . CESAREAN SECTION    . COLONOSCOPY WITH PROPOFOL N/A 02/24/2015    Procedure: COLONOSCOPY WITH PROPOFOL;  Surgeon: Garlan Fair, MD;  Location: [facility];  Service: Endoscopy;  Laterality: N/A;  . ESOPHAGOGASTRODUODENOSCOPY (EGD) WITH PROPOFOL N/A 02/24/2015   Procedure: ESOPHAGOGASTRODUODENOSCOPY (EGD) WITH PROPOFOL;  Surgeon: Garlan Fair, MD;  Location: [facility];  Service: Endoscopy;  Laterality: N/A;  . HAND SURGERY     due to trauma-dog bite.  . I&D EXTREMITY Bilateral 05/17/2013   Procedure: IRRIGATION AND DEBRIDEMENT EXTREMITY;  Surgeon: Roseanne Kaufman, MD;  Location: [facility];  Service: Orthopedics;  Laterality: Bilateral;  . LEFT HEART CATHETERIZATION WITH CORONARY ANGIOGRAM N/A 03/04/2014   Procedure: LEFT HEART CATHETERIZATION WITH CORONARY ANGIOGRAM;  Surgeon: Laverda Page, MD;  Location: [facility];  Service: Cardiovascular;  Laterality: N/A;  . OOPHORECTOMY    . SHOULDER SURGERY     RT  . THYROIDECTOMY  1996  . TONSILLECTOMY  1965  . TOTAL ABDOMINAL HYSTERECTOMY W/ BILATERAL SALPINGOOPHORECTOMY  01/2008   with cervical dysplasia  . TUBAL LIGATION  1984    Social History   Socioeconomic History  . Marital status: Married    Spouse name: Not on file  . Number of children: 1  . Years of education: Not on file  . Highest education level: Not on file  Occupational History  . Occupation: disabled    Employer: [employer]  Social Needs  . Financial resource strain: Not on file  . Food insecurity:    Worry: Not on file    Inability: Not on file  . Transportation needs:    Medical: Not on file    Non-medical: Not on file  Tobacco Use  . Smoking status: Former Smoker    Packs/day: 2.00    Years: 25.00    Pack years: 50.00    Types: Cigarettes    Last attempt to quit: 04/23/1996    Years since quitting: 21.5  . Smokeless tobacco: Never Used  Substance and Sexual Activity  . Alcohol use: No    Alcohol/week: 0.0 standard drinks  . Drug use: No  . Sexual activity: Not Currently  Lifestyle  . Physical activity:     Days per week: Not on file    Minutes per session: Not on file  . Stress: Not on file  Relationships  . Social connections:    Talks on phone: Not on file    Gets together: Not on file    Attends religious service: Not on file    Active member of club or organization: Not on file    Attends meetings of clubs or organizations: Not on file    Relationship status: Not on file  . Intimate partner violence:    Fear of current or ex partner: Not on file    Emotionally abused: Not on file  Physically abused: Not on file    Forced sexual activity: Not on file  Other Topics Concern  . Not on file  Social History Narrative   On worker's comp for lower back and right shoulder - applying for disability; losing [employer] insurance 03/07/2010    Family History  Problem Relation Age of Onset  . Lung cancer Mother   . Hypertension Mother   . Stroke Mother   . Diabetes Mother   . Diabetes Father   . Hypertension Father   . Heart attack Father   . Asthma Father   . COPD Father   . Alcohol abuse Father   . Diabetes Sister   . Hypertension Sister     ROS: no fevers or chills, productive cough, hemoptysis, dysphasia, odynophagia, melena, hematochezia, dysuria, hematuria, rash, seizure activity, orthopnea, PND, pedal edema, claudication. Remaining systems are negative.  Physical Exam: Well-developed well-nourished in no acute distress.  Skin is warm and dry.  HEENT is normal.  Neck is supple.  Chest is clear to auscultation with normal expansion.  Cardiovascular exam is regular rate and rhythm.  Abdominal exam nontender or distended. No masses palpated. Extremities show no edema. neuro grossly intact  ECG-normal sinus rhythm, nonspecific ST changes.  Personally reviewed  A/P  1 chest pain-patient continues to have occasional chest pain.  As outlined in previous notes CTO was not felt to be a good option.  It was also felt that coronary artery bypass and graft would not be a good  option.  We will continue with amlodipine.  She did not tolerate nitrates, beta-blocker or Ranexa in the past.  2 coronary artery disease-continue aspirin.  Patient is intolerant to statins.  3 hypertension-blood pressure is controlled.  Continue present medications.  4 hyperlipidemia-patient intolerant to statins and Zetia.  We will obtain most recent lipids from primary care and consider Repatha if LDL greater than 70.  5 dilated aortic root-patient will need follow-up CTA May 2020.  Kirk Ruths, MD

## 2017-10-26 ENCOUNTER — Other Ambulatory Visit: Payer: Self-pay | Admitting: Family Medicine

## 2017-10-26 DIAGNOSIS — F5104 Psychophysiologic insomnia: Secondary | ICD-10-CM

## 2017-10-26 DIAGNOSIS — F4323 Adjustment disorder with mixed anxiety and depressed mood: Secondary | ICD-10-CM

## 2017-10-26 DIAGNOSIS — G894 Chronic pain syndrome: Secondary | ICD-10-CM

## 2017-10-26 DIAGNOSIS — R5382 Chronic fatigue, unspecified: Secondary | ICD-10-CM

## 2017-10-26 DIAGNOSIS — M791 Myalgia, unspecified site: Secondary | ICD-10-CM

## 2017-10-30 ENCOUNTER — Encounter: Payer: Self-pay | Admitting: Cardiology

## 2017-10-30 ENCOUNTER — Ambulatory Visit: Payer: Medicare HMO | Admitting: Cardiology

## 2017-10-30 VITALS — BP 126/82 | HR 86 | Ht 67.0 in | Wt 187.8 lb

## 2017-10-30 DIAGNOSIS — I10 Essential (primary) hypertension: Secondary | ICD-10-CM | POA: Diagnosis not present

## 2017-10-30 DIAGNOSIS — R072 Precordial pain: Secondary | ICD-10-CM | POA: Diagnosis not present

## 2017-10-30 DIAGNOSIS — I2510 Atherosclerotic heart disease of native coronary artery without angina pectoris: Secondary | ICD-10-CM | POA: Diagnosis not present

## 2017-10-30 DIAGNOSIS — E7800 Pure hypercholesterolemia, unspecified: Secondary | ICD-10-CM | POA: Diagnosis not present

## 2017-10-30 NOTE — Patient Instructions (Signed)
Your physician wants you to follow-up in: 6 MONTHS WITH DR CRENSHAW You will receive a reminder letter in the mail two months in advance. If you don't receive a letter, please call our office to schedule the follow-up appointment.   If you need a refill on your cardiac medications before your next appointment, please call your pharmacy.  

## 2017-11-13 ENCOUNTER — Encounter: Payer: Self-pay | Admitting: Family Medicine

## 2017-11-13 ENCOUNTER — Ambulatory Visit (INDEPENDENT_AMBULATORY_CARE_PROVIDER_SITE_OTHER): Payer: Medicare HMO | Admitting: Family Medicine

## 2017-11-13 VITALS — BP 116/83 | HR 87 | Ht 67.0 in | Wt 184.0 lb

## 2017-11-13 DIAGNOSIS — F4323 Adjustment disorder with mixed anxiety and depressed mood: Secondary | ICD-10-CM | POA: Diagnosis not present

## 2017-11-13 DIAGNOSIS — R05 Cough: Secondary | ICD-10-CM

## 2017-11-13 DIAGNOSIS — J432 Centrilobular emphysema: Secondary | ICD-10-CM | POA: Diagnosis not present

## 2017-11-13 DIAGNOSIS — R5382 Chronic fatigue, unspecified: Secondary | ICD-10-CM

## 2017-11-13 DIAGNOSIS — E890 Postprocedural hypothyroidism: Secondary | ICD-10-CM | POA: Diagnosis not present

## 2017-11-13 DIAGNOSIS — E7800 Pure hypercholesterolemia, unspecified: Secondary | ICD-10-CM | POA: Diagnosis not present

## 2017-11-13 DIAGNOSIS — M159 Polyosteoarthritis, unspecified: Secondary | ICD-10-CM

## 2017-11-13 DIAGNOSIS — I2510 Atherosclerotic heart disease of native coronary artery without angina pectoris: Secondary | ICD-10-CM

## 2017-11-13 DIAGNOSIS — M791 Myalgia, unspecified site: Secondary | ICD-10-CM | POA: Diagnosis not present

## 2017-11-13 DIAGNOSIS — G894 Chronic pain syndrome: Secondary | ICD-10-CM

## 2017-11-13 DIAGNOSIS — R053 Chronic cough: Secondary | ICD-10-CM

## 2017-11-13 DIAGNOSIS — J329 Chronic sinusitis, unspecified: Secondary | ICD-10-CM

## 2017-11-13 MED ORDER — MELOXICAM 15 MG PO TABS: 15.0000 mg | ORAL_TABLET | Freq: Every day | ORAL | 0 refills | Status: DC

## 2017-11-13 MED ORDER — FEXOFENADINE HCL 180 MG PO TABS: 180.0000 mg | ORAL_TABLET | Freq: Every day | ORAL | 3 refills | Status: AC

## 2017-11-13 MED ORDER — DULOXETINE HCL 40 MG PO CPEP: 40.0000 mg | ORAL_CAPSULE | Freq: Every day | ORAL | 1 refills | Status: DC

## 2017-11-13 NOTE — Progress Notes (Signed)
Impression and Recommendations:    1. Centrilobular emphysema ([place])   2. Chronic cough due to COPD   3. Pure hypercholesterolemia   4. Coronary artery disease involving native coronary artery of native heart without angina pectoris   5. Generalized OA   6. Adjustment disorder with mixed anxiety and depressed mood   7. Postoperative hypothyroidism   8. Chronic fatigue   9. Myalgia/ generalized fibromyalgia   10. Chronic pain syndrome   11. Allergic Rhinosinusitis     Cholesterol/CAD -Pt will return tomorrow for lipid panel -Strongly encouraged to go to the lipid clinic for complex care management per Dr Jacalyn Lefevre recs- since intol to lipids and "all chol meds" that her Cardiologist has already tried -Discussed role of continuing to make healthy dietary and exercise changes to improve cholesterol -Discussed options for decreasing cost of medications including discount cards and possibly joining medical studies -Discussed impact of lower cholesterol on improving cardiovascular health   Emphysema -Referred pt to pulmonology -Discussed possible medications to manage COPD symptoms, but pt with multiple meds she did not tolerate due to heart palp and pt was seen by Dr Annamaria Boots.  Prefers to go back to him for PFT's vs outpt testing   Allergies -DC zyrtec - use allegra OTC  -Encouraged pt to switch allergy medications for improved efficacy -Strongly encouraged pt to discontinue afrin use - no more than 3 days of continuous use as d/cing can be very difficult with rebound rhinitis   Gen joint pains/ Arthritis -Started Mobic; see med list below -Discussed possible breathing difficulty with use of NSAIDs and possible red flag symptoms. -Educated pt on continuing exercise and movement to improve pain -Discussed OTC supplements that some pts have experience relief while using such as glucosamine chondroitin, tumeric etc.   Hypothyroidism -Pt will return tomorrow for thyroid testing  in addition to chol labs   Mood -Increased cymbalta dosage today; see med list below - pt believes that inc dose may improve her pain - will trial inc dose to see if helps   Fibromyalgia -Increasing cymbalta from 20 mg to 40 mg in hopes to alleviate some aches and pains which may be related to FM  Pt was in the office today for 32.5+ minutes, with over 50% time spent in face to face counseling of patients various medical conditions, treatment plans of those medical conditions including medicine management and lifestyle modification, strategies to improve health and well being; and in coordination of care. SEE ABOVE TREATMENT PLAN FOR DETAILS    Education and routine counseling performed. Handouts provided.  Orders Placed This Encounter  Procedures  . TSH  . T3, free  . T4, free  . Lipid panel  . Ambulatory referral to Pulmonology-    Dr  Annamaria Boots whom pt has seen in past    Meds ordered this encounter  Medications  . meloxicam (MOBIC) 15 MG tablet    Sig: Take 1 tablet (15 mg total) by mouth daily.    Dispense:  30 tablet    Refill:  0  . fexofenadine (ALLEGRA) 180 MG tablet    Sig: Take 1 tablet (180 mg total) by mouth daily.    Dispense:  90 tablet    Refill:  3  . DULoxetine 40 MG CPEP    Sig: Take 40 mg by mouth daily.    Dispense:  90 capsule    Refill:  1    Medications Discontinued During This Encounter  Medication Reason  .  cetirizine (ZYRTEC) 10 MG tablet   . DULoxetine (CYMBALTA) 20 MG capsule      The patient was counseled, risk factors were discussed, anticipatory guidance given.  Gross side effects, risk and benefits, and alternatives of medications discussed with patient.  Patient is aware that all medications have potential side effects and we are unable to predict every side effect or drug-drug interaction that may occur.  Expresses verbal understanding and consents to current therapy plan and treatment regimen.  Return for FBW near future-thyropid and  chol, then 3 to 4-mo since inc cymbalta, add mobic, refer pulm/lipid .clinic  Please see AVS handed out to patient at the end of our visit for further patient instructions/ counseling done pertaining to today's office visit.    Note:  This document was prepared using Dragon voice recognition software and may include unintentional dictation errors.    This document serves as a record of services personally performed by Mellody Dance, MD. It was created on her behalf by Georga Bora, a trained medical scribe. The creation of this record is based on the scribe's personal observations and the provider's statements to them.   I have reviewed the above medical documentation for accuracy and completeness and I concur.  Mellody Dance 11/13/17 8:41 PM    Subjective:    HPI: CHARELL FAULK is a 62 y.o. female who presents to [place] [facility] today for follow up for HTN.     Lifestyle -Pt has concerns about cost of medication- COPD meds and struggles to afford some care  -Has continued exercising despite "feeling winded" and feels this is helping her keep off her weight  -Pt exercises over 1hr each day using either a treadmill, bicycle or elliptical machine  -PT Started taking vitamin B12 on her own roughly a week ago and has experienced improvement in energy and joint pain  Mood -PT states her mood has been "fine" but thinks symptoms of aches and pains could be improved with increased dosage of cymbalta -Has not been experiencing SE from medication  Joint Pain -States she has been experiencing aching/ jt pains especially when sitting for a long time and goes to get up or first thing in the morning when trying to move  -States she "feels like a 62 year old woman" when she first gets up but pain subsides with continued movement  -Has been managing her pain with OTC Tylenol; states advil and aleve "burn her stomach"  -Has been experiencing pain in knees,  ankles and hips as well as some pain in her right hand  -States she had surgery on her hands after being attacked by a dog, and has experienced periodic pain since then with changes in weather    Hypothyroidism -States she has not been experiencing any side effects of current Thyroid medication -TSH low at 0.209 on 3 Dec 18   COPD -Pt states she has been experiencing a cough "all summer" -Says cough is usually much worse at night and disrupts her sleep at times -Says she has been "very winded" past c months -Has previously taken Singulair, Anoro, Advair and many others but they 'started my heart to racing" -Has continued taking allergy medication to help with breathing issues -Pulmonologist was Dr. Baird Lyons; hasn't see him in over two years   Allergies -States she switched from flonase to daily afrin for the last week and a half in addition to Zyrtec -Says her neti pot was causing her nose to  bleed so she discontinued using it   CAD -States she sees her cardiologist every six months; cardiologist states they can not "do more for the blockage" in her heart -Cardiologist recommended an injectable cholesterol medication but "it was $1100 a month" -Denies chest pain, dizziness, headaches etc   CHOL HPI:  -Sees Dr. Stanford Breed for management  -Pt has tried multiple medications but experienced SE to meds including tricor, zetia and several statins -Patient is regularly doing cardio based exercises and has made dietary improvements  -Pt was curious about an injected medication but is concerned about cost associated   Last lipid panel as follows:  Lab Results  Component Value Date   CHOL 206 (H) 02/06/2017   HDL 45 02/06/2017   LDLCALC 131 (H) 02/06/2017   LDLDIRECT 143.0 11/05/2015   TRIG 149 02/06/2017   CHOLHDL 4.6 (H) 02/06/2017    Hepatic Function Latest Ref Rng & Units 02/06/2017 06/28/2016 11/05/2015  Total Protein 6.0 - 8.5 g/dL 6.8 7.5 7.6  Albumin 3.5 - 5.5 g/dL 4.5  4.5 4.6  AST 0 - 40 IU/L _0 ALT 0 - 32 IU/L 7 12(L) 6  Alk Phosphatase 39 - 117 IU/L 100 92 85  Total Bilirubin 0.0 - 1.2 mg/dL 0.3 0.7 0.2  Bilirubin, Direct 0.0 - 0.3 mg/dL - - 0.0     HTN:  -  Her blood pressure has been controlled at home.   - Patient reports good compliance with blood pressure medications  - Denies medication S-E   - Smoking Status noted   - She denies new onset of: chest pain, exercise intolerance, shortness of breath, dizziness, visual changes, headache, lower extremity swelling or claudication.    Last 3 blood pressure readings in our office are as follows: BP Readings from Last 3 Encounters:  11/13/17 116/83  10/30/17 126/82  08/22/17 129/89    Pulse Readings from Last 3 Encounters:  11/13/17 87  10/30/17 86  08/22/17 82    Filed Weights   11/13/17 0840  Weight: 184 lb (83.5 kg)      Patient Care Team    Relationship Specialty Notifications Start End  Mellody Dance, DO PCP - General Family Medicine  07/05/16   Lelon Perla, MD Consulting Physician Cardiology  05/10/16   Deneise Lever, MD Consulting Physician Pulmonary Disease  05/10/16   Roseanne Kaufman, MD Consulting Physician Orthopedic Surgery  05/10/16   Garlan Fair, MD Consulting Physician Gastroenterology  05/10/16      Lab Results  Component Value Date   CREATININE 0.77 02/06/2017   BUN 11 02/06/2017   NA 142 02/06/2017   K 4.6 02/06/2017   CL 103 02/06/2017   CO2 22 02/06/2017    Lab Results  Component Value Date   CHOL 206 (H) 02/06/2017   CHOL 206 (H) 08/02/2016   CHOL 230 (H) 12/11/2015    Lab Results  Component Value Date   HDL 45 02/06/2017   HDL 40 08/02/2016   HDL 40 (L) 12/11/2015    Lab Results  Component Value Date   LDLCALC 131 (H) 02/06/2017   LDLCALC 115 (H) 08/02/2016   LDLCALC 142 (H) 12/11/2015    Lab Results  Component Value Date   TRIG 149 02/06/2017   TRIG 253 (H) 08/02/2016   TRIG 240 (H) 12/11/2015    Lab  Results  Component Value Date   CHOLHDL 4.6 (H) 02/06/2017   CHOLHDL 5.2 (H) 08/02/2016   CHOLHDL 5.8 (H) 12/11/2015  Lab Results  Component Value Date   LDLDIRECT 143.0 11/05/2015   LDLDIRECT 123.0 03/03/2015   LDLDIRECT 161.8 12/28/2010   ===================================================================   Patient Active Problem List   Diagnosis Date Noted  . Adjustment disorder with mixed anxiety and depressed mood 08/02/2016    Priority: High  . Stented coronary artery 02/26/2015    Priority: High  . Coronary artery disease 04/08/2013    Priority: High  . Hyperlipidemia- intol to statins 06/14/2007    Priority: High  . COPD mixed type ([place]) 06/14/2007    Priority: High  . Lung nodule: benign-appearing, screening CT each December 07/11/2017    Priority: Medium  . Insomnia secondary to anxiety 05/10/2016    Priority: Medium  . Obstructive sleep apnea 08/26/2015    Priority: Medium  . Centrilobular emphysema ([place]) 06/24/2014    Priority: Medium  . post-operative Hypothyroidism 10/06/2006    Priority: Medium  . S/P total hysterectomy and bilateral salpingo-oophorectomy 12/30/2016    Priority: Low  . Myalgia/ generalized fibromyalgia 08/02/2016    Priority: Low  . Vitamin D deficiency 10/07/2009    Priority: Low  . Chronic pain syndrome 02/20/2009    Priority: Low  . COMMON MIGRAINE 02/20/2009    Priority: Low  . Fatigue- chronic 07/15/2008    Priority: Low  . GERD 10/06/2006    Priority: Low  . Pure hypercholesterolemia 11/13/2017  . Generalized OA 11/13/2017  . Allergic Rhinosinusitis 11/13/2017  . Chronic cough due to COPD 11/13/2017  . Environmental and seasonal allergies-on Zyrtec, has reactive airway disease component seasonally 07/11/2017  . Senile solar keratosis 02/06/2017  . COPD with acute exacerbation ([place]) 01/11/2017  . ACS (acute coronary syndrome) 06/28/2016  . Crescendo angina ([place]) 06/28/2016  . Cough 04/28/2016  . Shingles rash  08/04/2015  . Allergic reaction 06/27/2015  . History of hemoptysis 06/27/2015  . Nocturnal hypoxemia 02/26/2015  . Hemoptysis 10/24/2014  . Soft tissue injury of multiple sites/bilateral hands 05/16/2013  . Bite from dog 05/15/2013  . Chest pain 05/15/2013  . COPD exacerbation ([place]) 12/31/2010  . Health care maintenance 12/25/2010  . MENOPAUSAL DISORDER 02/16/2010  . OSTEOARTHRITIS, KNEE, RIGHT 02/20/2009  . BACK PAIN 11/27/2008  . Memory loss 07/15/2008  . PERIPHERAL EDEMA 07/15/2008  . KNEE PAIN, BILATERAL 09/05/2007  . Seasonal and perennial allergic rhinitis 06/14/2007  . Anxiety state 10/06/2006  . Depression 10/06/2006     Past Medical History:  Diagnosis Date  . Allergy   . Anxiety   . COPD (chronic obstructive pulmonary disease) ([place])   . Coronary artery disease    a. s/p PCI of RCA in 2015 b. cath in 12/2015 showed 100% Ost RPDA with collaterals present  . DDD (degenerative disc disease), cervical   . Depression   . DJD (degenerative joint disease) of knee    RT  . GERD (gastroesophageal reflux disease)   . Hashimoto's disease   . Headache(784.0)   . Hyperlipidemia   . Hypertension   . Hypothyroidism   . Myocardial infarction ([place])   . Nocturnal hypoxemia 02/26/2015  . On supplemental oxygen therapy    Oxygen 2.5 l/m at bedtime.  . Stented coronary artery 02/26/2015     Past Surgical History:  Procedure Laterality Date  . ABDOMINAL HYSTERECTOMY    . APPENDECTOMY  1971  . BUNIONECTOMY Right   . CARDIAC CATHETERIZATION N/A 09/02/2014   Procedure: Left Heart Cath and Coronary Angiography;  Surgeon: Adrian Prows, MD;  Location: [facility];  Service: Cardiovascular;  Laterality: N/A;  . CARDIAC CATHETERIZATION N/A 12/15/2015   Procedure: Left Heart Cath and Coronary Angiography;  Surgeon: Jettie Booze, MD;  Location: [facility];  Service: Cardiovascular;  Laterality: N/A;  . CERVICAL FUSION    . CESAREAN SECTION    . COLONOSCOPY WITH  PROPOFOL N/A 02/24/2015   Procedure: COLONOSCOPY WITH PROPOFOL;  Surgeon: Garlan Fair, MD;  Location: [facility];  Service: Endoscopy;  Laterality: N/A;  . ESOPHAGOGASTRODUODENOSCOPY (EGD) WITH PROPOFOL N/A 02/24/2015   Procedure: ESOPHAGOGASTRODUODENOSCOPY (EGD) WITH PROPOFOL;  Surgeon: Garlan Fair, MD;  Location: [facility];  Service: Endoscopy;  Laterality: N/A;  . HAND SURGERY     due to trauma-dog bite.  . I&D EXTREMITY Bilateral 05/17/2013   Procedure: IRRIGATION AND DEBRIDEMENT EXTREMITY;  Surgeon: Roseanne Kaufman, MD;  Location: [facility];  Service: Orthopedics;  Laterality: Bilateral;  . LEFT HEART CATHETERIZATION WITH CORONARY ANGIOGRAM N/A 03/04/2014   Procedure: LEFT HEART CATHETERIZATION WITH CORONARY ANGIOGRAM;  Surgeon: Laverda Page, MD;  Location: [facility];  Service: Cardiovascular;  Laterality: N/A;  . OOPHORECTOMY    . SHOULDER SURGERY     RT  . THYROIDECTOMY  1996  . TONSILLECTOMY  1965  . TOTAL ABDOMINAL HYSTERECTOMY W/ BILATERAL SALPINGOOPHORECTOMY  01/2008   with cervical dysplasia  . TUBAL LIGATION  1984     Family History  Problem Relation Age of Onset  . Lung cancer Mother   . Hypertension Mother   . Stroke Mother   . Diabetes Mother   . Diabetes Father   . Hypertension Father   . Heart attack Father   . Asthma Father   . COPD Father   . Alcohol abuse Father   . Diabetes Sister   . Hypertension Sister      Social History   Substance and Sexual Activity  Drug Use No  ,  Social History   Substance and Sexual Activity  Alcohol Use No  . Alcohol/week: 0.0 standard drinks  ,  Social History   Tobacco Use  Smoking Status Former Smoker  . Packs/day: 2.00  . Years: 25.00  . Pack years: 50.00  . Types: Cigarettes  . Last attempt to quit: 04/23/1996  . Years since quitting: 21.5  Smokeless Tobacco Never Used  ,    Current Outpatient Medications on File Prior to Visit  Medication Sig Dispense Refill  . acetaminophen  (TYLENOL) 500 MG tablet Take 1,000 mg by mouth every 8 (eight) hours as needed (for pain or headahces).     Marland Kitchen albuterol (PROVENTIL HFA;VENTOLIN HFA) 108 (90 BASE) MCG/ACT inhaler Inhale 2 puffs into the lungs every 6 (six) hours as needed for wheezing or shortness of breath.    Marland Kitchen amitriptyline (ELAVIL) 25 MG tablet TAKE 2 TABLETS BY MOUTH AT BEDTIME 180 tablet 1  . amLODipine (NORVASC) 2.5 MG tablet Take 1 tablet (2.5 mg total) by mouth daily. 90 tablet 1  . aspirin EC 81 MG tablet Take 81 mg by mouth daily.    . B Complex Vitamins (VITAMIN B COMPLEX PO) Take 1 tablet by mouth daily.    Marland Kitchen buPROPion (WELLBUTRIN XL) 150 MG 24 hr tablet TAKE 1 TABLET (150 MG TOTAL) BY MOUTH DAILY. 90 tablet 0  . chlorpheniramine-HYDROcodone (TUSSIONEX) 10-8 MG/5ML SUER Take 5 mLs by mouth every 12 (twelve) hours as needed for cough (cough, will cause drowsiness.). 200 mL 0  . Cholecalciferol (VITAMIN D3) 5000 units CAPS Take 1 capsule by mouth daily.    Marland Kitchen  levothyroxine (SYNTHROID) 175 MCG tablet Take 1 tablet (175 mcg total) by mouth daily. 30 tablet 11  . nitroGLYCERIN (NITROSTAT) 0.4 MG SL tablet Place 0.4 mg under the tongue every 5 (five) minutes as needed for chest pain.    . pantoprazole (PROTONIX) 20 MG tablet Take 2 tablets (40 mg total) by mouth daily. 90 tablet 3  . spironolactone (ALDACTONE) 25 MG tablet TAKE 1 TABLET (25 MG TOTAL) BY MOUTH DAILY. 90 tablet 1   No current facility-administered medications on file prior to visit.      Allergies  Allergen Reactions  . Bactrim [Sulfamethoxazole-Trimethoprim] Shortness Of Breath, Anxiety and Palpitations  . Ketorolac Shortness Of Breath  . Morphine Other (See Comments)    headache  . Tetracyclines & Related Itching  . Valsartan Other (See Comments) and Cough    Headaches also   . Bystolic [Nebivolol Hcl] Other (See Comments)    Fatigue- Tired feeling  . Isosorbide Other (See Comments)    HEADACHES   . Levofloxacin Hives  . Lisinopril Cough  .  Metoprolol Other (See Comments)    FATIGUE  . Morphine And Related Other (See Comments)    headache  . Simvastatin Other (See Comments)    Makes legs hurt/ sore   . Singulair [Montelukast Sodium] Palpitations  . Statins Other (See Comments)    MYALGIAS  . Tetracycline Hives and Itching  . Toradol [Ketorolac Tromethamine] Palpitations     Review of Systems:   General:  Denies fever, chills Optho/Auditory:   Denies visual changes, blurred vision Respiratory:   Denies SOB, cough, wheeze, DIB, No R/R/W Cardiovascular:   Denies chest pain, palpitations, painful respirations Gastrointestinal:   Denies nausea, vomiting, diarrhea.  Endocrine:     Denies new hot or cold intolerance Musculoskeletal:  Denies joint swelling, gait issues, or new unexplained myalgias/ arthralgias Skin:  Denies rash, suspicious lesions  Neurological:    Denies dizziness, unexplained weakness, numbness  Psychiatric/Behavioral:   Denies mood changes   Objective:   Blood pressure 116/83, pulse 87, height _0  (1.702 m), weight 184 lb (83.5 kg), SpO2 98 %. Body mass index is 28.82 kg/m. General: Well Developed, well nourished, and in no acute distress.  HEENT: Normocephalic, atraumatic, pupils equal round reactive to light, neck supple, No carotid bruits, no JVD Skin: Warm and dry, cap RF less 2 sec Cardiac: Regular rate and rhythm, S1, S2 WNL's, no murmurs rubs or gallops Respiratory: ECTA B/L no W/R/R, Not using accessory muscles, speaking in full sentences. NeuroM-Sk: Ambulates w/o assistance, moves ext * 4 w/o difficulty, sensation grossly intact.  Ext: scant edema b/l lower ext Psych: No HI/SI, judgement and insight good, Euthymic mood. Full Affect.

## 2017-11-13 NOTE — Patient Instructions (Addendum)
Please try 1/2 tablet of the meloxicam daily and if that does not do the trick with the joint pains then go to 1 full tablet daily.  Also per your request since you are only on 20 mg of the Cymbalta, please go to 40 mg.  Of your existing medicines you can just take 2 of them per day to see if this helps with your generalized joint pains\aches. I have written a new prescription for 40 mg capsules for you as well.  Also please call Dr. Jacalyn Lefevre office about going to the lipid clinic.  Since you are medical management of your coronary artery disease, it is important to have well-controlled LDL cholesterol.  You been doing great from a dietary and lifestyle standpoint with your exercise and prudent diet, I think it is best to explore different options for better med management of your cholesterol as well.  -Please avoid Afrin nasal spray.  We will change your medicines from Zyrtec to Allegra and see if that does not help your symptoms a bit.

## 2017-11-16 ENCOUNTER — Other Ambulatory Visit: Payer: Medicare HMO

## 2017-11-16 DIAGNOSIS — E890 Postprocedural hypothyroidism: Secondary | ICD-10-CM

## 2017-11-16 DIAGNOSIS — E7800 Pure hypercholesterolemia, unspecified: Secondary | ICD-10-CM | POA: Diagnosis not present

## 2017-11-17 LAB — LIPID PANEL
CHOLESTEROL TOTAL: 195 mg/dL (ref 100–199)
Chol/HDL Ratio: 4.5 ratio — ABNORMAL HIGH (ref 0.0–4.4)
HDL: 43 mg/dL (ref >39)
LDL CALC: 119 mg/dL — AB (ref 0–99)
TRIGLYCERIDES: 165 mg/dL — AB (ref 0–149)
VLDL Cholesterol Cal: 33 mg/dL (ref 5–40)

## 2017-11-17 LAB — T4, FREE: Free T4: 1.84 ng/dL — ABNORMAL HIGH (ref 0.82–1.77)

## 2017-11-17 LAB — T3, FREE: T3, Free: 3.3 pg/mL (ref 2.0–4.4)

## 2017-11-17 LAB — TSH: TSH: 0.138 u[IU]/mL — AB (ref 0.450–4.500)

## 2017-11-27 ENCOUNTER — Other Ambulatory Visit: Payer: Self-pay

## 2017-11-27 ENCOUNTER — Telehealth: Payer: Self-pay | Admitting: Family Medicine

## 2017-11-27 ENCOUNTER — Telehealth: Payer: Self-pay | Admitting: Internal Medicine

## 2017-11-27 DIAGNOSIS — M159 Polyosteoarthritis, unspecified: Secondary | ICD-10-CM

## 2017-11-27 MED ORDER — MELOXICAM 15 MG PO TABS: 15.0000 mg | ORAL_TABLET | Freq: Every day | ORAL | 1 refills | Status: DC

## 2017-11-27 NOTE — Telephone Encounter (Signed)
Refill sent in and patient notified. MPulliam, CMA/RT(R)  

## 2017-11-27 NOTE — Telephone Encounter (Signed)
Patient called states Provider placed her on Mobic to combat the pain of arthritis & it along wit the Cymbalt are working great for her--- She is requesting a full 3 mon / 90 day supply be sent to her pharmancy.  Forwarding request for Rx refill of 90dys for: meloxicam (MOBIC) 15 MG tablet [[id number]]   Order Details  Dose: 15 mg Route: Oral Frequency: Daily  Dispense Quantity: 30 tablet Refills: 0 Fills remaining: --        Sig: Take 1 tablet (15 mg total) by mouth daily.          Please send order to: [facility] - [place], [place] [phone] (Phone) [phone] (Fax)   ---glh

## 2017-11-27 NOTE — Telephone Encounter (Signed)
Refill on Mobic for 90 day supply per approval from Dr. Raliegh Scarlet.  Patient notified and patient to continue Protonix. Patient states that medication is working great for symptoms and she denies and issues or concerns with the medication.  MPulliam, CMA/RT(R)

## 2017-11-28 ENCOUNTER — Ambulatory Visit: Payer: Medicare HMO | Admitting: Pulmonary Disease

## 2017-11-29 NOTE — Telephone Encounter (Signed)
Error

## 2017-12-01 ENCOUNTER — Telehealth: Payer: Self-pay | Admitting: Family Medicine

## 2017-12-01 ENCOUNTER — Telehealth: Payer: Self-pay

## 2017-12-01 NOTE — Telephone Encounter (Signed)
Patient called states she is about order her refills on : ( but had previously sent message to provider for a dosage change)  levothyroxine (SYNTHROID) 175 MCG tablet [[id number]]   Order Details  Dose: 175 mcg Route: Oral Frequency: Daily  Dispense Quantity: 30 tablet Refills: 11 Fills remaining: --        Sig: Take 1 tablet (175 mcg total) by mouth daily.          ---Patient states wanted to know if provider to review Rx for (dosage change request ) before she orders refill from pharmacy.  --Patient has 2 days supply left.  ---Please call patient before office closure today 12/01/17.  Forwarding message to medical assistant to review with Provider.   --glh

## 2017-12-01 NOTE — Telephone Encounter (Signed)
Sent message to Dr. Raliegh Scarlet for review for possible change in dose. MPulliam, CMA/RT(R)

## 2017-12-01 NOTE — Telephone Encounter (Signed)
Spoke to patient about her thyroid medication.  She states that for the last 4-5 months she has noticed the following symptoms:  Hair loss, fatigue, dry skin, weight gain even though she is watching her diet and exercising reguarly, some memory issues (forgeting why she went into to a room).  Patient needs a refill but wanted to check and she is dose needs to be changed.  Please review and advise.  Labs done on 11/16/17  lov 11/13/2017. Current dose of Levothyroxine is 175 mcg 1 tablet. MPulliam, CMA/RT(R)

## 2017-12-01 NOTE — Telephone Encounter (Signed)
Unfortunately those are symptoms of hypothyroidism not hyperthyroidism-like her labs suggest.    However I will change her medicine dose to ensure we get her levels in the normal range to optimize treatment.  Please call pt and let her know about the change.  After any change in dose she will need repeat TSH, free T4 and free T3 in 6 to 8 weeks.  Please place this future lab order and tell patient to make a lab only visit for this.  She will continue her Synthroid of 175 mcg but will only take it every day of the week except Wednesday and Saturday.  On Wed and Sat, she will take 150 mcg qd.    Please send in 2 new prescriptions for Synthroid reflecting these changes.  Okay for 90-day supply but no refills, as we will need to know what her labs are prior to refilling it.   Thank you very much.  Dr Jenetta Downer    Current Outpatient Medications:  .  acetaminophen (TYLENOL) 500 MG tablet, Take 1,000 mg by mouth every 8 (eight) hours as needed (for pain or headahces). , Disp: , Rfl:  .  albuterol (PROVENTIL HFA;VENTOLIN HFA) 108 (90 BASE) MCG/ACT inhaler, Inhale 2 puffs into the lungs every 6 (six) hours as needed for wheezing or shortness of breath., Disp: , Rfl:  .  amitriptyline (ELAVIL) 25 MG tablet, TAKE 2 TABLETS BY MOUTH AT BEDTIME, Disp: 180 tablet, Rfl: 1 .  amLODipine (NORVASC) 2.5 MG tablet, Take 1 tablet (2.5 mg total) by mouth daily., Disp: 90 tablet, Rfl: 1 .  aspirin EC 81 MG tablet, Take 81 mg by mouth daily., Disp: , Rfl:  .  B Complex Vitamins (VITAMIN B COMPLEX PO), Take 1 tablet by mouth daily., Disp: , Rfl:  .  buPROPion (WELLBUTRIN XL) 150 MG 24 hr tablet, TAKE 1 TABLET (150 MG TOTAL) BY MOUTH DAILY., Disp: 90 tablet, Rfl: 0 .  chlorpheniramine-HYDROcodone (TUSSIONEX) 10-8 MG/5ML SUER, Take 5 mLs by mouth every 12 (twelve) hours as needed for cough (cough, will cause drowsiness.)., Disp: 200 mL, Rfl: 0 .  Cholecalciferol (VITAMIN D3) 5000 units CAPS, Take 1 capsule by mouth daily.,  Disp: , Rfl:  .  DULoxetine 40 MG CPEP, Take 40 mg by mouth daily., Disp: 90 capsule, Rfl: 1 .  fexofenadine (ALLEGRA) 180 MG tablet, Take 1 tablet (180 mg total) by mouth daily., Disp: 90 tablet, Rfl: 3 .  levothyroxine (SYNTHROID) 175 MCG tablet, Take 1 tablet (175 mcg total) by mouth daily., Disp: 30 tablet, Rfl: 11 .  meloxicam (MOBIC) 15 MG tablet, Take 1 tablet (15 mg total) by mouth daily., Disp: 90 tablet, Rfl: 1 .  nitroGLYCERIN (NITROSTAT) 0.4 MG SL tablet, Place 0.4 mg under the tongue every 5 (five) minutes as needed for chest pain., Disp: , Rfl:  .  pantoprazole (PROTONIX) 20 MG tablet, Take 2 tablets (40 mg total) by mouth daily., Disp: 90 tablet, Rfl: 3 .  spironolactone (ALDACTONE) 25 MG tablet, TAKE 1 TABLET (25 MG TOTAL) BY MOUTH DAILY., Disp: 90 tablet, Rfl: 1

## 2017-12-01 NOTE — Telephone Encounter (Signed)
Pt informed of results.  Pt expressed understanding.  However, pt declines a second dose/RX for Synthroid, stating that she cannot afford another prescription.  Pt states that she would like to think about it over the weekend and will call back on Monday if she decides that she would like this RX sent to pharmacy.  Charyl Bigger, CMA

## 2017-12-03 NOTE — Progress Notes (Signed)
@Patient  ID: Amy Hodge, female    DOB: Mar 27, 1955, 62 y.o.   MRN: [id number]  Chief Complaint  Patient presents with  . Acute Visit    COPD - SOB, Wheezing     Referring provider: Mellody Dance, DO  HPI:  62 year old female former smoker followed for COPD (2016 PFT - COPD GOLD 0, Emphysema), Hemoptysis, Mild OSA (no cpap therapy)  PMH: CAD Smoker/ Smoking History: Former Smoker. 50 pack year history.  Maintenance:  None, couldn't tolerate Incruse or Spiriva per previous OV notes  Pt of: Dr Annamaria Boots   Recent [facility] Encounters:   Last Seen in our office in 2017  12/04/2017  - Visit   62 year old female presenting today for acute visit.  Patient is reporting she is having increased shortness of breath, chest tightness, wheezing and coughing.  The cough is productive with producing clear to yellow mucus.  Denies fever.  Patient reports the symptoms have been present for the last 2 to 3 months.  Patient has been discussing these symptoms with primary care.  Patient wanted to present to our office today for evaluation, consideration of controller therapies, and whether or not patient needs new pulmonary function test.  Patient denies any recent hemoptysis.  Patient reports one time after a cough she did have a few blood streaks in her mucus.  Patient has had a CT angio in May/2019 with showing persistent right upper lobe 4 mm nodule.  Patient planning on receiving flu vaccine in October/2019.   Tests:  07/07/2017-CT Angio- stable 4 cm ascending thoracic aortic aneurysm, 4 mm right upper lobe nodule stable since 03/05/2015 02/23/2017-CT chest lung cancer screening-lung RADS 2S, diffuse bronchial wall thickening with mild centrilobular and paraseptal emphysema, right upper lobe nodule stable at 4 mm  07/16/2015 - home sleep test -  AHI 6, SaO2 low 82%, average 90%  06/23/2014-pulmonary function test- FVC 2.72 (72% predicted), postbronchodilator ratio 72, FEV1 66, no  significant bronchodilator response, DLCO 69, concavity and flow volume loops >>>Moderate obstructive airway disease, and significant response of bronchodilator, mild diffusion defect  Chart Review:     Specialty Problems      Pulmonary Problems   COPD mixed type (HCC)    a1AT WNL MM 138 6MWT-06/23/2014-94%, 93%, 99%, 452 m on room air. No oxygen limitation. PFT-06/23/14- moderate obstructive airways disease with insignificant response to bronchodilator, normal lung capacity, diffusion mildly reduced. FVC 2.64/70%, FEV1 1.92/65%, FEV1/FVC 0.72  12/04/17>>> therapeutic trial of Anoro Ellipta, patient had previously failed Spiriva and incruse      Seasonal and perennial allergic rhinitis    Qualifier: Diagnosis of  By: Jenny Reichmann MD, Hunt Oris       COPD exacerbation ([place])   Centrilobular emphysema (HCC)    a1AT WNL MM 138 6MWT-06/23/2014-94%, 93%, 99%, 452 m on room air. No oxygen limitation. PFT-06/23/14- moderate obstructive airways disease with insignificant response to bronchodilator, normal lung capacity, diffusion mildly reduced. FVC 2.64/70%, FEV1 1.92/65%, FEV1/FVC 0.72 ONOX-04/30/14- 314 minutes with oxygen saturation less than or equal to 88% on room air, qualifying for home O2 during sleep      Hemoptysis    Reported 10/23/2014      Nocturnal hypoxemia    On chronic 2.5 L [place] at night only      Obstructive sleep apnea    Very minimal, AHI barely above the upper limit of normal at 6.0/hour Unattended Home Sleep Test 07/16/2015-AHI 6.0/hour, average saturation 90%, weight 186 pounds  Cough   COPD with acute exacerbation (HCC)   Lung nodule: benign-appearing, screening CT each December   Allergic Rhinosinusitis   Chronic cough due to COPD      Allergies  Allergen Reactions  . Bactrim [Sulfamethoxazole-Trimethoprim] Shortness Of Breath, Anxiety and Palpitations  . Ketorolac Shortness Of Breath  . Morphine Other (See Comments)    headache  . Tetracyclines &  Related Itching  . Valsartan Other (See Comments) and Cough    Headaches also   . Bystolic [Nebivolol Hcl] Other (See Comments)    Fatigue- Tired feeling  . Isosorbide Other (See Comments)    HEADACHES   . Levofloxacin Hives  . Lisinopril Cough  . Metoprolol Other (See Comments)    FATIGUE  . Morphine And Related Other (See Comments)    headache  . Simvastatin Other (See Comments)    Makes legs hurt/ sore   . Singulair [Montelukast Sodium] Palpitations  . Statins Other (See Comments)    MYALGIAS  . Tetracycline Hives and Itching  . Toradol [Ketorolac Tromethamine] Palpitations    Immunization History  Administered Date(s) Administered  . Influenza Split 12/31/2010  . Influenza Whole 11/27/2008, 02/16/2010  . Influenza,inj,Quad PF,6+ Mos 12/30/2016  . Influenza-Unspecified 05/15/2013, 12/06/2014, 11/05/2015  . Pneumococcal Conjugate-13 02/15/2013  . Pneumococcal Polysaccharide-23 03/07/2012  . Td 03/07/2004  . Tdap 02/26/2015    Past Medical History:  Diagnosis Date  . Allergy   . Anxiety   . COPD (chronic obstructive pulmonary disease) ([place])   . Coronary artery disease    a. s/p PCI of RCA in 2015 b. cath in 12/2015 showed 100% Ost RPDA with collaterals present  . DDD (degenerative disc disease), cervical   . Depression   . DJD (degenerative joint disease) of knee    RT  . GERD (gastroesophageal reflux disease)   . Hashimoto's disease   . Headache(784.0)   . Hyperlipidemia   . Hypertension   . Hypothyroidism   . Myocardial infarction ([place])   . Nocturnal hypoxemia 02/26/2015  . On supplemental oxygen therapy    Oxygen 2.5 l/m at bedtime.  . Stented coronary artery 02/26/2015    Tobacco History: Social History   Tobacco Use  Smoking Status Former Smoker  . Packs/day: 2.00  . Years: 25.00  . Pack years: 50.00  . Types: Cigarettes  . Last attempt to quit: 04/23/1996  . Years since quitting: 21.6  Smokeless Tobacco Never Used   Counseling given:  Yes  Continue to not smoke.  Patient denies smoking or vaping.  Patient denies being around others to smoke or vape.  Outpatient Encounter Medications as of 12/04/2017  Medication Sig  . acetaminophen (TYLENOL) 500 MG tablet Take 1,000 mg by mouth every 8 (eight) hours as needed (for pain or headahces).   [place] Kitchen albuterol (PROVENTIL HFA;VENTOLIN HFA) 108 (90 BASE) MCG/ACT inhaler Inhale 2 puffs into the lungs every 6 (six) hours as needed for wheezing or shortness of breath.  Mar[place] Kitchen amitriptyline (ELAVIL) 25 MG tablet TAKE 2 TABLETS BY MOUTH AT BEDTIME  . amLODipine (NORVASC) 2.5 MG tablet Take 1 tablet (2.5 mg total) by mouth daily.  Mar[place] Kitchen aspirin EC 81 MG tablet Take 81 mg by mouth daily.  . B Complex Vitamins (VITAMIN B COMPLEX PO) Take 1 tablet by mouth daily.  [place] Kitchen buPROPion (WELLBUTRIN XL) 150 MG 24 hr tablet TAKE 1 TABLET (150 MG TOTAL) BY MOUTH DAILY.  . chlorpheniramine-HYDROcodone (TUSSIONEX) 10-8 MG/5ML SUER Take 5 mLs by mouth every 12 (twelve) hours as  needed for cough (cough, will cause drowsiness.).  Marland Kitchen Cholecalciferol (VITAMIN D3) 5000 units CAPS Take 1 capsule by mouth daily.  . DULoxetine 40 MG CPEP Take 40 mg by mouth daily.  . fexofenadine (ALLEGRA) 180 MG tablet Take 1 tablet (180 mg total) by mouth daily.  Mar[place] Kitchen levothyroxine (SYNTHROID) 175 MCG tablet Take 1 tablet (175 mcg total) by mouth daily.  . meloxicam (MOBIC) 15 MG tablet Take 1 tablet (15 mg total) by mouth daily.  . nitroGLYCERIN (NITROSTAT) 0.4 MG SL tablet Place 0.4 mg under the tongue every 5 (five) minutes as needed for chest pain.  . pantoprazole (PROTONIX) 20 MG tablet Take 2 tablets (40 mg total) by mouth daily.  Mar[place] Kitchen spironolactone (ALDACTONE) 25 MG tablet TAKE 1 TABLET (25 MG TOTAL) BY MOUTH DAILY.  [place] Kitchen levalbuterol (XOPENEX HFA) 45 MCG/ACT inhaler Inhale 2 puffs into the lungs every 6 (six) hours as needed for wheezing.  Marland Kitchen umeclidinium-vilanterol (ANORO ELLIPTA) 62.5-25 MCG/INH AEPB Inhale 1 puff into the lungs daily.   No  facility-administered encounter medications on file as of 12/04/2017.      Review of Systems  Review of Systems  Constitutional: Positive for fatigue. Negative for chills, fever and unexpected weight change.  HENT: Positive for congestion. Negative for ear pain, postnasal drip, sinus pressure and sinus pain.   Respiratory: Positive for cough (yellow to clear ), chest tightness, shortness of breath and wheezing.   Cardiovascular: Negative for chest pain and palpitations.  Gastrointestinal: Negative for blood in stool, diarrhea, nausea and vomiting.       Breakthrough indigestion and heartburn, on PPI  Genitourinary: Negative for dysuria, frequency and urgency.  Musculoskeletal: Negative for arthralgias.  Skin: Negative for color change.  Allergic/Immunologic: Negative for environmental allergies and food allergies.  Neurological: Negative for dizziness, light-headedness and headaches.  Psychiatric/Behavioral: Positive for sleep disturbance (Snoring, no increased daytime fatigue). Negative for dysphoric mood. The patient is not nervous/anxious.   All other systems reviewed and are negative.    Physical Exam  BP 122/84 (BP Location: Left Arm, Cuff Size: Normal)   Pulse 83   Ht 5' 6.5" (1.689 m)   Wt 190 lb (86.2 kg)   SpO2 95%   BMI 30.21 kg/m   Wt Readings from Last 5 Encounters:  12/04/17 190 lb (86.2 kg)  11/13/17 184 lb (83.5 kg)  10/30/17 187 lb 12.8 oz (85.2 kg)  08/22/17 189 lb 12.8 oz (86.1 kg)  07/11/17 183 lb 11.2 oz (83.3 kg)    Physical Exam  Constitutional: She is oriented to person, place, and time and well-developed, well-nourished, and in no distress. No distress.  HENT:  Head: Normocephalic and atraumatic.  Right Ear: Hearing, tympanic membrane, external ear and ear canal normal.  Left Ear: Hearing, tympanic membrane, external ear and ear canal normal.  Nose: Mucosal edema and rhinorrhea present. Right sinus exhibits no maxillary sinus tenderness and no  frontal sinus tenderness. Left sinus exhibits no maxillary sinus tenderness and no frontal sinus tenderness.  Mouth/Throat: Uvula is midline and oropharynx is clear and moist. No oropharyngeal exudate.  Eyes: Pupils are equal, round, and reactive to light.  Neck: Normal range of motion. Neck supple. No JVD present.  Cardiovascular: Normal rate, regular rhythm and normal heart sounds.  Pulmonary/Chest: Effort normal and breath sounds normal. No accessory muscle usage. No respiratory distress. She has no decreased breath sounds. She has no wheezes. She has no rhonchi.  Abdominal: Soft. Bowel sounds are normal. There is no tenderness.  Musculoskeletal:  Normal range of motion. She exhibits no edema.  Lymphadenopathy:    She has no cervical adenopathy.  Neurological: She is alert and oriented to person, place, and time. Gait normal.  Skin: Skin is warm and dry. She is not diaphoretic. No erythema.  Psychiatric: Mood, memory, affect and judgment normal.  Nursing note and vitals reviewed.     Lab Results:  CBC    Component Value Date/Time   WBC 5.0 02/06/2017 0924   WBC 7.9 06/28/2016 1201   RBC 4.76 02/06/2017 0924   RBC 5.11 06/28/2016 1201   HGB 13.7 02/06/2017 0924   HCT 40.4 02/06/2017 0924   PLT 303 02/06/2017 0924   MCV 85 02/06/2017 0924   MCH 28.8 02/06/2017 0924   MCH 28.6 06/28/2016 1201   MCHC 33.9 02/06/2017 0924   MCHC 33.2 06/28/2016 1201   RDW 14.3 02/06/2017 0924   LYMPHSABS 1.8 02/06/2017 0924   MONOABS 0.5 06/28/2016 1201   EOSABS 0.2 02/06/2017 0924   BASOSABS 0.0 02/06/2017 0924    BMET    Component Value Date/Time   NA 142 02/06/2017 0924   K 4.6 02/06/2017 0924   CL 103 02/06/2017 0924   CO2 22 02/06/2017 0924   GLUCOSE 98 02/06/2017 0924   GLUCOSE 84 06/28/2016 1201   BUN 11 02/06/2017 0924   CREATININE 0.77 02/06/2017 0924   CREATININE 0.79 12/11/2015 1242   CALCIUM 9.2 02/06/2017 0924   GFRNONAA 85 02/06/2017 0924   GFRAA 98 02/06/2017 0924     BNP    Component Value Date/Time   BNP 24.1 06/28/2016 1201    ProBNP No results found for: PROBNP  Imaging: No results found.    Assessment & Plan:   Pleasant 62 year old patient seen office visit today.  We will have patient follow back up in 4 to 6 weeks to see how she is doing with her Anoro Ellipta.  We will do a therapeutic trial of the patient today.  Patient to contact our office when she is halfway through her sample, if patient is liking the inhaler that we can place a prescription.  We will also place a prescription for the Xopenex HFA.  Patient had previously failed Pro Air therapy as it was causing palpitations.  Previous office note in 2017 recommended Xopenex HFA.  Patient was never started did not receive this.  Could consider repeat pulmonary function test if shortness of breath persist with Anoro Ellipta.  Patient plans on getting flu vaccine with spouse in October/2019.  COPD mixed type ([place]) Therapeutic trial of Anoro Ellipta  >>> Take 1 puff daily in the morning right when you wake up >>>Rinse your mouth out after use >>>This is a daily maintenance inhaler, NOT a rescue inhaler >>>Contact our office if you are having difficulties affording or obtaining this medication >>>It is important for you to be able to take this daily and not miss any doses   Only use your Xopenex as a rescue medication to be used if you can't catch your breath by resting or doing a relaxed purse lip breathing pattern.  - The less you use it, the better it will work when you need it. - Ok to use up to 2 puffs  every 4 hours if you must but call for immediate appointment if use goes up over your usual need - Don't leave home without it !!  (think of it like the spare tire for your car)   Receive flu vaccine in October with your  spouse  Follow-up in 4 to 6 weeks with Dr. Annamaria Boots  Could consider pulmonary function test if shortness of breath persist with Anoro  Ellipta   Obstructive sleep apnea Patient still having snoring No increase in daytime fatigue or sleepiness We will continue to monitor informed patient to follow-up with our office if symptoms worsen  Continue to work towards healthy weight and monitor diet  Seasonal and perennial allergic rhinitis Continue Allegra Can add Flonase nasal spray for nasal congestion and allergy symptoms if needed  GERD Patient with breakthrough indigestion per interview today.  Patient continue Protonix as prescribed Patient review GERD literature provided today Explained to patient extensively that she needs to monitor her diet in order to help better manage cough and GERD     Lauraine Rinne, NP 12/04/2017

## 2017-12-04 ENCOUNTER — Encounter: Payer: Self-pay | Admitting: Pulmonary Disease

## 2017-12-04 ENCOUNTER — Ambulatory Visit: Payer: Medicare HMO | Admitting: Pulmonary Disease

## 2017-12-04 DIAGNOSIS — J449 Chronic obstructive pulmonary disease, unspecified: Secondary | ICD-10-CM | POA: Diagnosis not present

## 2017-12-04 DIAGNOSIS — G4733 Obstructive sleep apnea (adult) (pediatric): Secondary | ICD-10-CM

## 2017-12-04 DIAGNOSIS — J3089 Other allergic rhinitis: Secondary | ICD-10-CM | POA: Diagnosis not present

## 2017-12-04 DIAGNOSIS — K219 Gastro-esophageal reflux disease without esophagitis: Secondary | ICD-10-CM | POA: Diagnosis not present

## 2017-12-04 DIAGNOSIS — J302 Other seasonal allergic rhinitis: Secondary | ICD-10-CM | POA: Diagnosis not present

## 2017-12-04 MED ORDER — UMECLIDINIUM-VILANTEROL 62.5-25 MCG/INH IN AEPB: 1.0000 | INHALATION_SPRAY | Freq: Every day | RESPIRATORY_TRACT | 0 refills | Status: DC

## 2017-12-04 MED ORDER — LEVALBUTEROL TARTRATE 45 MCG/ACT IN AERO: 2.0000 | INHALATION_SPRAY | Freq: Four times a day (QID) | RESPIRATORY_TRACT | 5 refills | Status: DC | PRN

## 2017-12-04 NOTE — Assessment & Plan Note (Signed)
Continue Allegra Can add Flonase nasal spray for nasal congestion and allergy symptoms if needed

## 2017-12-04 NOTE — Assessment & Plan Note (Signed)
Therapeutic trial of Anoro Ellipta  >>> Take 1 puff daily in the morning right when you wake up >>>Rinse your mouth out after use >>>This is a daily maintenance inhaler, NOT a rescue inhaler >>>Contact our office if you are having difficulties affording or obtaining this medication >>>It is important for you to be able to take this daily and not miss any doses   Only use your Xopenex as a rescue medication to be used if you can't catch your breath by resting or doing a relaxed purse lip breathing pattern.  - The less you use it, the better it will work when you need it. - Ok to use up to 2 puffs  every 4 hours if you must but call for immediate appointment if use goes up over your usual need - Don't leave home without it !!  (think of it like the spare tire for your car)   Receive flu vaccine in October with your spouse  Follow-up in 4 to 6 weeks with Dr. Annamaria Boots  Could consider pulmonary function test if shortness of breath persist with Anoro Ellipta

## 2017-12-04 NOTE — Assessment & Plan Note (Signed)
Patient with breakthrough indigestion per interview today.  Patient continue Protonix as prescribed Patient review GERD literature provided today Explained to patient extensively that she needs to monitor her diet in order to help better manage cough and GERD

## 2017-12-04 NOTE — Patient Instructions (Signed)
Anoro Ellipta  >>> Take 1 puff daily in the morning right when you wake up >>>Rinse your mouth out after use >>>This is a daily maintenance inhaler, NOT a rescue inhaler >>>Contact our office if you are having difficulties affording or obtaining this medication >>>It is important for you to be able to take this daily and not miss any doses   Only use your Xopenex as a rescue medication to be used if you can't catch your breath by resting or doing a relaxed purse lip breathing pattern.  - The less you use it, the better it will work when you need it. - Ok to use up to 2 puffs  every 4 hours if you must but call for immediate appointment if use goes up over your usual need - Don't leave home without it !!  (think of it like the spare tire for your car)   Continue Allegra daily Continue Protonix 40 mg Review GERD literature listed below  Continue to monitor your snoring and mild obstructive sleep apnea >>>Notify our office if your daytime fatigue is worsening or if you are falling asleep more during the day  Receive flu vaccine in October with your spouse  Follow-up in 4 to 6 weeks with Dr. Annamaria Boots  Could consider pulmonary function test if shortness of breath persist with Anoro Ellipta   It is flu season:   >>>Remember to be washing your hands regularly, using hand sanitizer, be careful to use around herself with has contact with people who are sick will increase her chances of getting sick yourself. >>> Best ways to protect herself from the flu: Receive the yearly flu vaccine, practice good hand hygiene washing with soap and also using hand sanitizer when available, eat a nutritious meals, get adequate rest, hydrate appropriately    As of 01/08/2018 we will be moving! We will no longer be at our [place] location.   Our new address and phone number will be:  [address] [place], [place] [address] Telephone number: [phone]   Please contact the office if your symptoms  worsen or you have concerns that you are not improving.   Thank you for choosing [facility] for your healthcare, and for allowing [name] to partner with you on your healthcare journey. I am thankful to be able to provide care to you today.   Wyn Quaker FNP-C    Food Choices for Gastroesophageal Reflux Disease, Adult When you have gastroesophageal reflux disease (GERD), the foods you eat and your eating habits are very important. Choosing the right foods can help ease your discomfort. What guidelines do I need to follow?  Choose fruits, vegetables, whole grains, and low-fat dairy products.  Choose low-fat meat, fish, and poultry.  Limit fats such as oils, salad dressings, butter, nuts, and avocado.  Keep a food diary. This helps you identify foods that cause symptoms.  Avoid foods that cause symptoms. These may be different for everyone.  Eat small meals often instead of 3 large meals a day.  Eat your meals slowly, in a place where you are relaxed.  Limit fried foods.  Cook foods using methods other than frying.  Avoid drinking alcohol.  Avoid drinking large amounts of liquids with your meals.  Avoid bending over or lying down until 2-3 hours after eating. What foods are not recommended? These are some foods and drinks that may make your symptoms worse: Vegetables Tomatoes. Tomato juice. Tomato and spaghetti sauce. Chili peppers. Onion and garlic.  Horseradish. Fruits Oranges, grapefruit, and lemon (fruit and juice). Meats High-fat meats, fish, and poultry. This includes hot dogs, ribs, ham, sausage, salami, and bacon. Dairy Whole milk and chocolate milk. Sour cream. Cream. Butter. Ice cream. Cream cheese. Drinks Coffee and tea. Bubbly (carbonated) drinks or energy drinks. Condiments Hot sauce. Barbecue sauce. Sweets/Desserts Chocolate and cocoa. Donuts. Peppermint and spearmint. Fats and Oils High-fat foods. This includes [place] fries and potato  chips. Other Vinegar. Strong spices. This includes black pepper, white pepper, red pepper, cayenne, curry powder, cloves, ginger, and chili powder. The items listed above may not be a complete list of foods and drinks to avoid. Contact your dietitian for more information. This information is not intended to replace advice given to you by your health care provider. Make sure you discuss any questions you have with your health care provider. Document Released: 08/23/2011 Document Revised: 07/30/2015 Document Reviewed: 12/26/2012 Elsevier Interactive Patient Education  2017 [place].     Gastroesophageal Reflux Disease, Adult Normally, food travels down the esophagus and stays in the stomach to be digested. If a person has gastroesophageal reflux disease (GERD), food and stomach acid move back up into the esophagus. When this happens, the esophagus becomes sore and swollen (inflamed). Over time, GERD can make small holes (ulcers) in the lining of the esophagus. Follow these instructions at home: Diet  Follow a diet as told by your doctor. You may need to avoid foods and drinks such as: ? Coffee and tea (with or without caffeine). ? Drinks that contain alcohol. ? Energy drinks and sports drinks. ? Carbonated drinks or sodas. ? Chocolate and cocoa. ? Peppermint and mint flavorings. ? Garlic and onions. ? Horseradish. ? Spicy and acidic foods, such as peppers, chili powder, curry powder, vinegar, hot sauces, and BBQ sauce. ? Citrus fruit juices and citrus fruits, such as oranges, lemons, and limes. ? Tomato-based foods, such as red sauce, chili, salsa, and pizza with red sauce. ? Fried and fatty foods, such as donuts, french fries, potato chips, and high-fat dressings. ? High-fat meats, such as hot dogs, rib eye steak, sausage, ham, and bacon. ? High-fat dairy items, such as whole milk, butter, and cream cheese.  Eat small meals often. Avoid eating large meals.  Avoid drinking large  amounts of liquid with your meals.  Avoid eating meals during the 2-3 hours before bedtime.  Avoid lying down right after you eat.  Do not exercise right after you eat. General instructions  Pay attention to any changes in your symptoms.  Take over-the-counter and prescription medicines only as told by your doctor. Do not take aspirin, ibuprofen, or other NSAIDs unless your doctor says it is okay.  Do not use any tobacco products, including cigarettes, chewing tobacco, and e-cigarettes. If you need help quitting, ask your doctor.  Wear loose clothes. Do not wear anything tight around your waist.  Raise (elevate) the head of your bed about 6 inches (15 cm).  Try to lower your stress. If you need help doing this, ask your doctor.  If you are overweight, lose an amount of weight that is healthy for you. Ask your doctor about a safe weight loss goal.  Keep all follow-up visits as told by your doctor. This is important. Contact a doctor if:  You have new symptoms.  You lose weight and you do not know why it is happening.  You have trouble swallowing, or it hurts to swallow.  You have wheezing or a cough that keeps  happening.  Your symptoms do not get better with treatment.  You have a hoarse voice. Get help right away if:  You have pain in your arms, neck, jaw, teeth, or back.  You feel sweaty, dizzy, or light-headed.  You have chest pain or shortness of breath.  You throw up (vomit) and your throw up looks like blood or coffee grounds.  You pass out (faint).  Your poop (stool) is bloody or black.  You cannot swallow, drink, or eat. This information is not intended to replace advice given to you by your health care provider. Make sure you discuss any questions you have with your health care provider. Document Released: 08/10/2007 Document Revised: 07/30/2015 Document Reviewed: 06/18/2014 Elsevier Interactive Patient Education  [employer].

## 2017-12-04 NOTE — Assessment & Plan Note (Signed)
Patient still having snoring No increase in daytime fatigue or sleepiness We will continue to monitor informed patient to follow-up with our office if symptoms worsen  Continue to work towards healthy weight and monitor diet

## 2017-12-04 NOTE — Assessment & Plan Note (Signed)
>>  ASSESSMENT AND PLAN FOR OBSTRUCTIVE SLEEP APNEA WRITTEN ON 12/04/2017 10:09 AM BY MACK, BRIAN P, NP  Patient still having snoring No increase in daytime fatigue or sleepiness We will continue to monitor informed patient to follow-up with our office if symptoms worsen  Continue to work towards healthy weight and monitor diet

## 2017-12-11 ENCOUNTER — Telehealth: Payer: Self-pay | Admitting: *Deleted

## 2017-12-11 ENCOUNTER — Other Ambulatory Visit: Payer: Self-pay | Admitting: *Deleted

## 2017-12-11 MED ORDER — UMECLIDINIUM-VILANTEROL 62.5-25 MCG/INH IN AEPB: 1.0000 | INHALATION_SPRAY | Freq: Every day | RESPIRATORY_TRACT | 3 refills | Status: DC

## 2017-12-11 MED ORDER — UMECLIDINIUM-VILANTEROL 62.5-25 MCG/INH IN AEPB: 1.0000 | INHALATION_SPRAY | Freq: Every day | RESPIRATORY_TRACT | 0 refills | Status: DC

## 2017-12-11 NOTE — Addendum Note (Signed)
Addended by: Amado Coe on: 12/11/2017 09:27 AM   Modules accepted: Orders

## 2017-12-11 NOTE — Addendum Note (Signed)
Addended by: Amado Coe on: 12/11/2017 09:31 AM   Modules accepted: Orders

## 2017-12-11 NOTE — Telephone Encounter (Signed)
RX sent to pharmacy.  RX printed for 1 month free with sample attached. Placed in bag at front with name on it  Paperwork placed in bag highlighted areas for patient to fill out. Paperwork placed in bag at front with name on it for pick up.

## 2017-12-11 NOTE — Telephone Encounter (Signed)
Very happy to hear.  Thank you for following up on the patient.  As discussed in office today patient may be a good candidate for [place] for me.  We can provide that paperwork with her sample she can pick up and fill out.  Wyn Quaker FNP

## 2017-12-11 NOTE — Telephone Encounter (Signed)
Called and spoke with patient. She said she is going much better on the Anoro. She was asking about samples. I told her I would leave some at the front with her.   Nothing further needed.  Will route to B. Warner Mccreedy as Juluis Rainier

## 2017-12-26 ENCOUNTER — Ambulatory Visit (INDEPENDENT_AMBULATORY_CARE_PROVIDER_SITE_OTHER): Payer: Medicare HMO

## 2017-12-26 VITALS — BP 120/78 | HR 80 | Temp 98.2°F | Ht 66.5 in | Wt 190.0 lb

## 2017-12-26 DIAGNOSIS — Z23 Encounter for immunization: Secondary | ICD-10-CM

## 2017-12-26 NOTE — Progress Notes (Signed)
Patient came in for flu vaccine.  Patient tolerated injection well. MPulliam, CMA/RT(R)  

## 2018-01-01 ENCOUNTER — Encounter: Payer: Self-pay | Admitting: Internal Medicine

## 2018-01-01 ENCOUNTER — Ambulatory Visit: Payer: Medicare HMO | Admitting: Internal Medicine

## 2018-01-01 ENCOUNTER — Telehealth: Payer: Self-pay

## 2018-01-01 VITALS — BP 122/68 | HR 87 | Ht 66.5 in | Wt 188.0 lb

## 2018-01-01 DIAGNOSIS — J449 Chronic obstructive pulmonary disease, unspecified: Secondary | ICD-10-CM

## 2018-01-01 DIAGNOSIS — G4733 Obstructive sleep apnea (adult) (pediatric): Secondary | ICD-10-CM | POA: Diagnosis not present

## 2018-01-01 DIAGNOSIS — Z23 Encounter for immunization: Secondary | ICD-10-CM

## 2018-01-01 MED ORDER — OMEPRAZOLE 20 MG PO CPDR: 20.0000 mg | DELAYED_RELEASE_CAPSULE | Freq: Every day | ORAL | 1 refills | Status: DC

## 2018-01-01 MED ORDER — LEVALBUTEROL TARTRATE 45 MCG/ACT IN AERO: 2.0000 | INHALATION_SPRAY | Freq: Four times a day (QID) | RESPIRATORY_TRACT | 5 refills | Status: DC | PRN

## 2018-01-01 NOTE — Patient Instructions (Addendum)
Order- pneumovax-23  Script sent for levalbuterol HFA (Xopenex) which should cause less jitter than the Ventolin  Sample and  script for Bevespi inhaler   Inhale 2 puffs, twice daily   Try this instead of Anoro

## 2018-01-01 NOTE — Telephone Encounter (Signed)
Yes agreed.  Thank you for sending 90-day prescription with 1 refill.

## 2018-01-01 NOTE — Progress Notes (Signed)
HPI female former smoker followed for COPD mixed type, hemoptysis, complicated by CAD/ MI G3TD WNL MM 138 6MWT-06/23/2014-94%, 93%, 99%, 452 m on room air. No oxygen limitation. PFT-06/23/14- moderate obstructive airways disease with insignificant response to bronchodilator, normal lung capacity, diffusion mildly reduced. FVC 2.64/70%, FEV1 1.92/65%, FEV1/FVC 0.72 ONOX-04/30/14- 314 minutes with oxygen saturation less than or equal to 88% on room air, qualifying for home O2 during sleep ECHO 05/16/13 EF 60-65%, GR1 diast dysfunction  ---------------------------------------------------------------------------------------------------- 08/26/2015-62 year old female former smoker followed for COPD mixed type, hemoptysis, complicated by CAD FOLLOWS FOR:SOB with activity, cough-productive, congestion. Had been coughing up blood about 2 months ago. Had CT Chest 02-2015. Unattended Home Sleep Test 07/16/2015-AHI 6.0/hour, average saturation 90%, weight 186 pounds We discussed conservative management of snoring She has been using home oxygen for sleep but would like to stop it and did not desaturate significantly on her sleep study. After hemoptysis in December, she had another episode with bright red blood from mouth there were not for about a week in March and has now resolved. CT did not show an obvious source. She is on Plavix and aspirin. Using rescue inhaler more frequently now because of humid weather. Can only tolerate one puff at a time because of palpitations. Failed to tolerate either Incruse or Spiriva. CT chest 03/05/2015 IMPRESSION: 1. Emphysema. A cause for the patient's hemoptysis is not identified. 2. Average size 4 mm right upper lobe pulmonary nodule. If the patient is at high risk for bronchogenic carcinoma, follow-up chest CT at 1 year is recommended. If the patient is at low risk, no follow-up is needed. This recommendation follows the consensus statement: Guidelines for Management  of Small Pulmonary Nodules Detected on CT Scans: A Statement from the [place] as published in Radiology 2005; 237:395-400. 3. Ectatic ascending thoracic aorta, although similar in size to the main pulmonary artery. Recommend annual imaging followup by CTA or MRA. This recommendation follows 2010 ACCF/AHA/AATS/ACR/ASA/SCA/SCAI/SIR/STS/SVM Guidelines for the Diagnosis and Management of Patients with Thoracic Aortic Disease. Circulation. 2010; 121: V761-Y073 4. Coronary artery atherosclerosis. Electronically Signed  By: Van Clines M.D.  On: 03/05/2015   01/01/2018- 62 year old female former smoker followed for COPD mixed type, hemoptysis, complicated by CAD/ MI -----4 wk f/u for COPD. Patient states she can not take Anoro due to her forming blisters in her mouth.  Albuterol HFA,  Ventolin HFA causes jitters-only tolerates 1 puff.  Recognizes little wheezing.  Frequent cough, occasionally greenish-yellow without fever or blood. Going to [employer] exercise program.  More aware of dyspnea on exertion. Notices increased cough lying down but finds omeprazole prevents this.  ROS-see HPI      + = positive Constitutional:   No-   weight loss, night sweats, fevers, chills, fatigue, lassitude. HEENT:   + headaches, difficulty swallowing, tooth/dental problems, sore throat,       No-  sneezing, itching, ear ache, +nasal congestion, post nasal drip,  CV:  +chest pain, orthopnea, PND, swelling in lower extremities, anasarca,                                                          dizziness, +palpitations Resp: +shortness of breath with exertion or at rest.              +  productive cough,  No non-productive  cough,  + coughing up of blood.              +  change in color of mucus.  wheezing.   Skin: No-   rash or lesions. GI:  + heartburn, indigestion, abdominal pain, nausea, vomiting,  GU:  MS:  +  joint pain or swelling.   Neuro-     nothing unusual Psych:  No- change  in mood or affect. No depression or anxiety.  No memory loss.  OBJ- Physical Exam General- Alert, Oriented, Affect-appropriate, Distress- none acute Skin- rash-none, lesions- none, excoriation- none Lymphadenopathy- none Head- atraumatic            Eyes- Gross vision intact, PERRLA, conjunctivae and secretions clear            Ears- Hearing, canals-normal            Nose- Clear, no-Septal dev, mucus, polyps, erosion, perforation             Throat- Mallampati II , mucosa clear , drainage- none, tonsils- atrophic,                    + dentures Neck- flexible , trachea midline, no stridor , thyroid nl, carotid no bruit Chest - symmetrical excursion , unlabored           Heart/CV- RRR , no murmur , no gallop  , no rub, nl s1 s2                           - JVD- none , edema- none, stasis changes- none, varices- none           Lung- clear to P&A, wheeze- none, cough- none , dullness-none, rub- none           Chest wall-  Abd-  Br/ Gen/ Rectal- Not done, not indicated Extrem- cyanosis- none, clubbing, none, atrophy- none, strength- nl Neuro- grossly intact to observation

## 2018-01-01 NOTE — Telephone Encounter (Signed)
Per Dr Raliegh Scarlet, switch from Protonix to Omeprazole 20 mg once daily.

## 2018-01-03 ENCOUNTER — Other Ambulatory Visit: Payer: Self-pay | Admitting: Family Medicine

## 2018-01-03 ENCOUNTER — Other Ambulatory Visit: Payer: Self-pay | Admitting: Cardiology

## 2018-01-03 DIAGNOSIS — F4323 Adjustment disorder with mixed anxiety and depressed mood: Secondary | ICD-10-CM

## 2018-01-03 NOTE — Telephone Encounter (Signed)
Rx request sent to pharmacy.  

## 2018-01-04 ENCOUNTER — Telehealth: Payer: Self-pay | Admitting: Internal Medicine

## 2018-01-04 MED ORDER — BUDESONIDE-FORMOTEROL FUMARATE 160-4.5 MCG/ACT IN AERO: 2.0000 | INHALATION_SPRAY | Freq: Two times a day (BID) | RESPIRATORY_TRACT | 6 refills | Status: DC

## 2018-01-04 NOTE — Telephone Encounter (Signed)
PA request received from [facility] for Xopenex [facility] Key: [id number] PA request has been sent to plan, and a determination is expected within 3 days.   Routing to [place] for follow-up.

## 2018-01-04 NOTE — Telephone Encounter (Signed)
Spoke with patient. She stated that she been using the Bevespi inhaler sample given to her after her 01/01/18 visit. Since then, she has developed a dry, nonproductive deep cough. She wants to know if she can be switched to another inhaler. She also has had an increase of SOB due to the cough. Denies any symptoms of tongue swelling, blisters in mouth or fever.   CY, please advise. Thanks!

## 2018-01-04 NOTE — Telephone Encounter (Signed)
We will need to dc Bevespi. Suggest we offer sample Symbicort 160    Inhale 2 puffs, then rinse mouth, twice daily  If she likes this, call for script.

## 2018-01-04 NOTE — Telephone Encounter (Signed)
Called pt and advised message from the provider. Pt understood and verbalized understanding. Nothing further is needed.   Will leave samples of Symbicort up front for pt to pick up.

## 2018-01-05 NOTE — Telephone Encounter (Signed)
Checked on status of PA, it is still pending. Will leave encounter open for follow up.

## 2018-01-09 NOTE — Telephone Encounter (Signed)
Per [employer], Xopenex has been approved until 02/2019. Pharmacy is aware. Will close this encounter.

## 2018-02-12 NOTE — Assessment & Plan Note (Signed)
She has not tolerated normal doses of albuterol so we are going to try lev albuterol as a rescue inhaler.  Encouraged her to continue exercise. Plan-try Bevespi inhaler instead of Anoro, because of complaints of mouth irritation by Anoro.  Try left albuterol HFA for better sympathetic tolerance.

## 2018-02-12 NOTE — Assessment & Plan Note (Signed)
>>  ASSESSMENT AND PLAN FOR OBSTRUCTIVE SLEEP APNEA WRITTEN ON 02/12/2018 10:22 AM BY YOUNG, CLINTON D, MD  Staying with conservative management, sleep off flat of back and keep weight in normal range.

## 2018-02-12 NOTE — Assessment & Plan Note (Signed)
Staying with conservative management, sleep off flat of back and keep weight in normal range.

## 2018-03-16 ENCOUNTER — Other Ambulatory Visit: Payer: Self-pay

## 2018-03-16 ENCOUNTER — Other Ambulatory Visit: Payer: Medicare HMO

## 2018-03-16 DIAGNOSIS — R5382 Chronic fatigue, unspecified: Secondary | ICD-10-CM | POA: Diagnosis not present

## 2018-03-16 DIAGNOSIS — R7989 Other specified abnormal findings of blood chemistry: Secondary | ICD-10-CM

## 2018-03-17 LAB — T4, FREE: Free T4: 1.56 ng/dL (ref 0.82–1.77)

## 2018-03-17 LAB — TSH: TSH: 1.49 u[IU]/mL (ref 0.450–4.500)

## 2018-03-17 LAB — T3, FREE: T3, Free: 3 pg/mL (ref 2.0–4.4)

## 2018-03-21 ENCOUNTER — Ambulatory Visit (INDEPENDENT_AMBULATORY_CARE_PROVIDER_SITE_OTHER): Payer: Medicare HMO | Admitting: Family Medicine

## 2018-03-21 ENCOUNTER — Encounter: Payer: Self-pay | Admitting: Family Medicine

## 2018-03-21 VITALS — BP 127/89 | HR 85 | Temp 98.2°F | Ht 67.0 in | Wt 194.8 lb

## 2018-03-21 DIAGNOSIS — E785 Hyperlipidemia, unspecified: Secondary | ICD-10-CM

## 2018-03-21 DIAGNOSIS — M159 Polyosteoarthritis, unspecified: Secondary | ICD-10-CM | POA: Diagnosis not present

## 2018-03-21 DIAGNOSIS — Z789 Other specified health status: Secondary | ICD-10-CM

## 2018-03-21 DIAGNOSIS — R5382 Chronic fatigue, unspecified: Secondary | ICD-10-CM | POA: Diagnosis not present

## 2018-03-21 DIAGNOSIS — J432 Centrilobular emphysema: Secondary | ICD-10-CM

## 2018-03-21 DIAGNOSIS — M791 Myalgia, unspecified site: Secondary | ICD-10-CM | POA: Diagnosis not present

## 2018-03-21 DIAGNOSIS — R3915 Urgency of urination: Secondary | ICD-10-CM

## 2018-03-21 DIAGNOSIS — K219 Gastro-esophageal reflux disease without esophagitis: Secondary | ICD-10-CM

## 2018-03-21 LAB — POCT URINALYSIS DIPSTICK
Bilirubin, UA: NEGATIVE
Blood, UA: NEGATIVE
Glucose, UA: NEGATIVE
KETONES UA: NEGATIVE
Leukocytes, UA: NEGATIVE
Nitrite, UA: NEGATIVE
PROTEIN UA: NEGATIVE
SPEC GRAV UA: 1.01 (ref 1.010–1.025)
UROBILINOGEN UA: 0.2 U/dL
pH, UA: 5.5 (ref 5.0–8.0)

## 2018-03-21 MED ORDER — OMEPRAZOLE 20 MG PO CPDR: 20.0000 mg | DELAYED_RELEASE_CAPSULE | Freq: Two times a day (BID) | ORAL | 1 refills | Status: DC

## 2018-03-21 NOTE — Patient Instructions (Addendum)
Amy Hodge please run a UA on patient's urine.  She has urgency and we need to rule out urinary tract infection.  If she would like to wait for results she can otherwise we will call her with them if positive.  If there is anything positive on the UA please send for culture.  Thank you.    Great job on the lifestyle changes Amy Hodge.  You are doing great.  Keep it up!      COPD and Physical Activity Chronic obstructive pulmonary disease (COPD) is a long-term (chronic) condition that affects the lungs. COPD is a general term that can be used to describe many different lung problems that cause lung swelling (inflammation) and limit airflow, including chronic bronchitis and emphysema. The main symptom of COPD is shortness of breath, which makes it harder to do even simple tasks. This can also make it harder to exercise and be active. Talk with your health care provider about treatments to help you breathe better and actions you can take to prevent breathing problems during physical activity. What are the benefits of exercising with COPD? Exercising regularly is an important part of a healthy lifestyle. You can still exercise and do physical activities even though you have COPD. Exercise and physical activity improve your shortness of breath by increasing blood flow (circulation). This causes your heart to pump more oxygen through your body. Moderate exercise can improve your:  Oxygen use.  Energy level.  Shortness of breath.  Strength in your breathing muscles.  Heart health.  Sleep.  Self-esteem and feelings of self-worth.  Depression, stress, and anxiety levels. Exercise can benefit everyone with COPD. The severity of your disease may affect how hard you can exercise, especially at first, but everyone can benefit. Talk with your health care provider about how much exercise is safe for you, and which activities and exercises are safe for you. What actions can I take to prevent breathing  problems during physical activity?  Sign up for a pulmonary rehabilitation program. This type of program may include: ? Education about lung diseases. ? Exercise classes that teach you how to exercise and be more active while improving your breathing. This usually involves:  Exercise using your lower extremities, such as a stationary bicycle.  About 30 minutes of exercise, 2 to 5 times per week, for 6 to 12 weeks  Strength training, such as push ups or leg lifts. ? Nutrition education. ? Group classes in which you can talk with others who also have COPD and learn ways to manage stress.  If you use an oxygen tank, you should use it while you exercise. Work with your health care provider to adjust your oxygen for your physical activity. Your resting flow rate is different from your flow rate during physical activity.  While you are exercising: ? Take slow breaths. ? Pace yourself and do not try to go too fast. ? Purse your lips while breathing out. Pursing your lips is similar to a kissing or whistling position. ? If doing exercise that uses a quick burst of effort, such as weight lifting:  Breathe in before starting the exercise.  Breathe out during the hardest part of the exercise (such as raising the weights). Where to find support You can find support for exercising with COPD from:  Your health care provider.  A pulmonary rehabilitation program.  Your local health department or community health programs.  Support groups, online or in-person. Your health care provider may be able to recommend support  groups. Where to find more information You can find more information about exercising with COPD from:  American Lung Association: ClassInsider.se.  COPD Foundation: https://www.rivera.net/. Contact a health care provider if:  Your symptoms get worse.  You have chest pain.  You have nausea.  You have a fever.  You have trouble talking or catching your breath.  You want to start a  new exercise program or a new activity. Summary  COPD is a general term that can be used to describe many different lung problems that cause lung swelling (inflammation) and limit airflow. This includes chronic bronchitis and emphysema.  Exercise and physical activity improve your shortness of breath by increasing blood flow (circulation). This causes your heart to provide more oxygen to your body.  Contact your health care provider before starting any exercise program or new activity. Ask your health care provider what exercises and activities are safe for you. This information is not intended to replace advice given to you by your health care provider. Make sure you discuss any questions you have with your health care provider. Document Released: 03/16/2017 Document Revised: 03/16/2017 Document Reviewed: 03/16/2017 Elsevier Interactive Patient Education  2019 [employer].      Please realize, EXERCISE IS MEDICINE!  -  American Heart Association ( AHA) guidelines for exercise : If you are in good health, without any medical conditions, you should engage in 150-300 minutes of moderate intensity aerobic activity per week.  This means you should be huffing and puffing throughout your workout.   Engaging in regular exercise will improve brain function and memory, as well as improve mood, boost immune system and help with weight management.  As well as the other, more well-known effects of exercise such as decreasing blood sugar levels, decreasing blood pressure,  and decreasing bad cholesterol levels/ increasing good cholesterol levels.     -  The AHA strongly endorses consumption of a diet that contains a variety of foods from all the food categories with an emphasis on fruits and vegetables; fat-free and low-fat dairy products; cereal and grain products; legumes and nuts; and fish, poultry, and/or extra lean meats.    Excessive food intake, especially of foods high in saturated and trans fats,  sugar, and salt, should be avoided.    Adequate water intake of roughly 1/2 of your weight in pounds, should equal the ounces of water per day you should drink.  So for instance, if you're 200 pounds, that would be 100 ounces of water per day.         Mediterranean Diet  Why follow it? Research shows. . Those who follow the Mediterranean diet have a reduced risk of heart disease  . The diet is associated with a reduced incidence of Parkinson's and Alzheimer's diseases . People following the diet may have longer life expectancies and lower rates of chronic diseases  . The Dietary Guidelines for Americans recommends the Mediterranean diet as an eating plan to promote health and prevent disease  What Is the Mediterranean Diet?  . Healthy eating plan based on typical foods and recipes of Mediterranean-style cooking . The diet is primarily a plant based diet; these foods should make up a majority of meals   Starches - Plant based foods should make up a majority of meals - They are an important sources of vitamins, minerals, energy, antioxidants, and fiber - Choose whole grains, foods high in fiber and minimally processed items  - Typical grain sources include wheat, oats, barley, corn, brown  rice, bulgar, farro, millet, polenta, couscous  - Various types of beans include chickpeas, lentils, fava beans, black beans, white beans   Fruits  Veggies - Large quantities of antioxidant rich fruits & veggies; 6 or more servings  - Vegetables can be eaten raw or lightly drizzled with oil and cooked  - Vegetables common to the traditional Mediterranean Diet include: artichokes, arugula, beets, broccoli, brussel sprouts, cabbage, carrots, celery, collard greens, cucumbers, eggplant, kale, leeks, lemons, lettuce, mushrooms, okra, onions, peas, peppers, potatoes, pumpkin, radishes, rutabaga, shallots, spinach, sweet potatoes, turnips, zucchini - Fruits common to the Mediterranean Diet include: apples,  apricots, avocados, cherries, clementines, dates, figs, grapefruits, grapes, melons, nectarines, oranges, peaches, pears, pomegranates, strawberries, tangerines  Fats - Replace butter and margarine with healthy oils, such as olive oil, canola oil, and tahini  - Limit nuts to no more than a handful a day  - Nuts include walnuts, almonds, pecans, pistachios, pine nuts  - Limit or avoid candied, honey roasted or heavily salted nuts - Olives are central to the [employer] - can be eaten whole or used in a variety of dishes   Meats Protein - Limiting red meat: no more than a few times a month - When eating red meat: choose lean cuts and keep the portion to the size of deck of cards - Eggs: approx. 0 to 4 times a week  - Fish and lean poultry: at least 2 a week  - Healthy protein sources include, chicken, [place], lean beef, lamb - Increase intake of seafood such as tuna, salmon, trout, mackerel, shrimp, scallops - Avoid or limit high fat processed meats such as sausage and bacon  Dairy - Include moderate amounts of low fat dairy products  - Focus on healthy dairy such as fat free yogurt, skim milk, low or reduced fat cheese - Limit dairy products higher in fat such as whole or 2% milk, cheese, ice cream  Alcohol - Moderate amounts of red wine is ok  - No more than 5 oz daily for women (all ages) and men older than age 57  - No more than 10 oz of wine daily for men younger than 53  Other - Limit sweets and other desserts  - Use herbs and spices instead of salt to flavor foods  - Herbs and spices common to the traditional Mediterranean Diet include: basil, bay leaves, chives, cloves, cumin, fennel, garlic, lavender, marjoram, mint, oregano, parsley, pepper, rosemary, sage, savory, sumac, tarragon, thyme   It's not just a diet, it's a lifestyle:  . The Mediterranean diet includes lifestyle factors typical of those in the region  . Foods, drinks and meals are best eaten with others and  savored . Daily physical activity is important for overall good health . This could be strenuous exercise like running and aerobics . This could also be more leisurely activities such as walking, housework, yard-work, or taking the stairs . Moderation is the key; a balanced and healthy diet accommodates most foods and drinks . Consider portion sizes and frequency of consumption of certain foods   Meal Ideas & Options:  . Breakfast:  o Whole wheat toast or whole wheat English muffins with peanut butter & hard boiled egg o Steel cut oats topped with apples & cinnamon and skim milk  o Fresh fruit: banana, strawberries, melon, berries, peaches  o Smoothies: strawberries, bananas, greek yogurt, peanut butter o Low fat greek yogurt with blueberries and granola  o Egg white omelet with spinach and mushrooms  o Breakfast couscous: whole wheat couscous, apricots, skim milk, cranberries  . Sandwiches:  o Hummus and grilled vegetables (peppers, zucchini, squash) on whole wheat bread   o Grilled chicken on whole wheat pita with lettuce, tomatoes, cucumbers or tzatziki  o Tuna salad on whole wheat bread: tuna salad made with greek yogurt, olives, red peppers, capers, green onions o Garlic rosemary lamb pita: lamb sauted with garlic, rosemary, salt & pepper; add lettuce, cucumber, greek yogurt to pita - flavor with lemon juice and black pepper  . Seafood:  o Mediterranean grilled salmon, seasoned with garlic, basil, parsley, lemon juice and black pepper o Shrimp, lemon, and spinach whole-grain pasta salad made with low fat greek yogurt  o Seared scallops with lemon orzo  o Seared tuna steaks seasoned salt, pepper, coriander topped with tomato mixture of olives, tomatoes, olive oil, minced garlic, parsley, green onions and cappers  . Meats:  o Herbed greek chicken salad with kalamata olives, cucumber, feta  o Red bell peppers stuffed with spinach, bulgur, lean ground beef (or lentils) & topped with feta    o Kebabs: skewers of chicken, tomatoes, onions, zucchini, squash  o [place] burgers: made with red onions, mint, dill, lemon juice, feta cheese topped with roasted red peppers . Vegetarian o Cucumber salad: cucumbers, artichoke hearts, celery, red onion, feta cheese, tossed in olive oil & lemon juice  o Hummus and whole grain pita points with a greek salad (lettuce, tomato, feta, olives, cucumbers, red onion) o Lentil soup with celery, carrots made with vegetable broth, garlic, salt and pepper  o Tabouli salad: parsley, bulgur, mint, scallions, cucumbers, tomato, radishes, lemon juice, olive oil, salt and pepper.

## 2018-03-21 NOTE — Progress Notes (Signed)
Impression and Recommendations:    1. Chronic fatigue   2. Myalgia/ generalized fibromyalgia   3. Generalized OA   4. Centrilobular emphysema ([place])   5. Hyperlipidemia, unspecified hyperlipidemia type   6. Drug intolerance- statin   7. Urinary urgency-  new onset with pain in body in middle of night that goes away with urination   8. Gastroesophageal reflux disease, esophagitis presence not specified     - Discussed need for patient to continue to obtain management and screenings with all established specialists.  Educated patient at length about the critical importance of keeping health maintenance up to date.  - Participated in lengthy conversation about chronic health concerns and all questions were answered.  1. Chronic fatigue & Generalized Myalgia - Managed on Cymbalta - Cymbalta has helped with patient's symptoms.  Denies complaints. - Per patient, fatigue has been alleviated by exercising 30 min per day 5-6 days per week.  - Increased Cymbalta dose from 20 to 40 last OV.  2. Hyperlipidemia; Drug intolerance- statin - Patient followed up with Dr. Stanford Breed as recommended. - Management will continue to be coordinated by Dr. Stanford Breed of cardiology.  - Patient wishes to avoid other treatment try to keep exercising and modify her diet to try to control her cholesterol.  - Will continue to monitor.  Patient will follow up with specialist as recommended.  3. Generalized OA - Added Mobic last appointment due to generalized aches and pains. - Pain managed successfully on Mobic.  However, patient had GI upset and reflux type symptoms due to the Mobic, and had to return to omeprazole BID.  - With omeprazole BID, patient tolerates Mobic well.  - Advised patient to utilize ice in areas of inflammation, such as her knees, PRN.  4. GERD - Stable at this time.  - Omeprazole dose increased from 20 once daily to 20 BID. - Patient tolerating meds well without complication.   Denies S-E  - In past, attempted to reduce omeprazole to once daily, but patient cannot tolerate Mobic without omeprazole BID.  - Will continue to monitor.  5. Pulmonology- Centrilobular emphysema ([place]) - Referred to Dr. Annamaria Boots of pulmonology, whom she had seen in the past, because she is intolerant to many COPD meds due to heart palpitations.  - Management will continue to be coordinated by Pulmonology.  - Will continue to monitor.  6. Thyroid - Managed on Synthroid - Stable at this time. - Continue treatment plan as prescribed.  See med list below. - Patient tolerating meds well without complication.  Denies S-E.  - Will continue to monitor.  7. Urinary Urgency w/Pain - Alleviated with Urination - Because the pain is relieved when patient urinates, need to evaluate today. - Urinalysis ordered today.  - Will continue to monitor.  - Patient knows to return PRN for chronic or acute concerns.  8. BMI Counseling - BMI of 30.51 - Explained to patient what BMI refers to, and what it means medically.    Told patient to think about it as a "medical risk stratification measurement" and how increasing BMI is associated with increasing risk/ or worsening state of various diseases such as hypertension, hyperlipidemia, diabetes, premature OA, depression etc.  American Heart Association guidelines for healthy diet, basically Mediterranean diet, and exercise guidelines of 30 minutes 5 days per week or more discussed in detail.  Health counseling performed.  All questions answered.  9. Lifestyle & Preventative Health Maintenance - Advised patient to continue working toward  exercising to improve overall mental, physical, and emotional health.  - Strongly encouraged patient to continue her exercise habits of 30 minutes per day, 5-6 days per week, alternating treadmill, elliptical, and stationary bike.  - Reviewed the "spokes of the wheel" of mood and health management.  Stressed the importance  of ongoing prudent habits, including regular exercise, appropriate sleep hygiene, healthful dietary habits, and prayer/meditation to relax.  - Encouraged patient to engage in daily physical activity, especially a formal exercise routine.  Recommended that the patient eventually strive for at least 150 minutes of moderate cardiovascular activity per week according to guidelines established by the [facility].   - Healthy dietary habits encouraged, including low-carb, and high amounts of lean protein in diet.   - Patient should also consume adequate amounts of water.   Orders Placed This Encounter  Procedures  . POCT urinalysis dipstick    Meds ordered this encounter  Medications  . omeprazole (PRILOSEC) 20 MG capsule    Sig: Take 1 capsule (20 mg total) by mouth 2 (two) times daily before a meal.    Dispense:  180 capsule    Refill:  1    Medications Discontinued During This Encounter  Medication Reason  . budesonide-formoterol (SYMBICORT) 160-4.5 MCG/ACT inhaler   . chlorpheniramine-HYDROcodone ([place]) 10-8 MG/5ML SUER Completed Course  . omeprazole (PRILOSEC) 20 MG capsule Reorder     Gross side effects, risk and benefits, and alternatives of medications and treatment plan in general discussed with patient.  Patient is aware that all medications have potential side effects and we are unable to predict every side effect or drug-drug interaction that may occur.   Patient will call with any questions prior to using medication if they have concerns.    Expresses verbal understanding and consents to current therapy and treatment regimen.  No barriers to understanding were identified.  Red flag symptoms and signs discussed in detail.  Patient expressed understanding regarding what to do in case of emergency\urgent symptoms  Please see AVS handed out to patient at the end of our visit for further patient instructions/ counseling done pertaining to today's office visit.   Return in about 4  months (around 07/20/2018) for 75mo- MMP check- FM, HLD, OA, GERD, thyroid, etc.     Note:  This note was prepared with assistance of Dragon voice recognition software. Occasional wrong-word or sound-a-like substitutions may have occurred due to the inherent limitations of voice recognition software.   This document serves as a record of services personally performed by Mellody Dance, DO. It was created on her behalf by Lilienne Amend, a trained medical scribe. The creation of this record is based on the scribe's personal observations and the provider's statements to them.   I have reviewed the above medical documentation for accuracy and completeness and I concur.  Mellody Dance, DO 03/21/2018 1:01 PM        --------------------------------------------------------------------------------------------------------------------------------    Subjective:     HPI: Amy Hodge is a 63 y.o. female who presents to [place] [facility] today for issues as discussed below.  Patient has been exercising for thirty minutes per day, 5-6 days per week, alternating treadmill, elliptical, and stationary bike.  Patient's energy levels have improved now that she is exercising 30 minutes 5-6 days per week.  Generalized Myalgias Increased Cymbalta dose from 20 to 40 last OV.  Patient was confused at first about when this dose was changed, but during discussion, management was clarified.  Thinks  the Cymbalta is also helping with her mood.  Nightly Pain, Alleviated with Urination Experiencing a wave of pain just about every night that causes her to awaken and need to urinate.  When she urinates, the pain subsides.  Feel [place] all over her body, in her chest and arms, etc.  Has had new increased urgency recently.  Denies burning during urination.  No increased frequency more than usual (always experiences frequency).  Denies blood in her urine or other  abnormalities.  Hyperlipidemia Last appointment, patient was advised to follow up with Dr. Stanford Breed regarding her lipids.  Patient is intolerant to statins and was going to follow up with him regarding further treatment options.  She did obtain this follow-up and discussed other management with specialist Dr. Stanford Breed.  They reviewed that these treatments may cause S-E, especially with regards to her treatments through pulmonology, that deterred patient.  Patient states she is still exercising.  Right now, she wants to hold off.  "I'm doing so good, and I don't want [to start] anything that's going to mess that up."  Arthritis - Added Mobic Last Visit For patient's osteoarthritis, genrealized aches and pains, added Mobic last visit.  Patient was taking omeprazole, one tablet, one capsule a day; tried coming off of the Mobic because it was burning her stomach.  Notes her joints swelled, so she went back to the Mobic, but increased the omeprazole to one in the morning and one at night, and "this seems to be going well."  Mobic working well for her arthritis.  Pulmonology Was referred to pulmonary, Dr. Annamaria Boots she had seen in the past, because she is intolerant to many COPD meds due to heart palpitations.  Patient states that she was put on a medication but didn't take it.  She was placed on a second medication and notes "I did take it, and it helped, but then I started getting blisters in my mouth, all down my throat.  Thrush, I guess."  Was placed on another medication that "made her cough like you would not believe."  She returns for follow up with pulmonology next month.  States "the next step will be a nebulizer."    "Right now I'm on nothing."  Has had some shortness of breath issues during the current weather.  Denies increased swelling in her legs.   Wt Readings from Last 3 Encounters:  03/21/18 194 lb 12.8 oz (88.4 kg)  01/01/18 188 lb (85.3 kg)  12/26/17 190 lb (86.2 kg)   BP  Readings from Last 3 Encounters:  03/21/18 127/89  01/01/18 122/68  12/26/17 120/78   Pulse Readings from Last 3 Encounters:  03/21/18 85  01/01/18 87  12/26/17 80   BMI Readings from Last 3 Encounters:  03/21/18 30.51 kg/m  01/01/18 29.89 kg/m  12/26/17 30.21 kg/m     Patient Care Team    Relationship Specialty Notifications Start End  Mellody Dance, DO PCP - General Family Medicine  07/05/16   Lelon Perla, MD Consulting Physician Cardiology  05/10/16   Deneise Lever, MD Consulting Physician Pulmonary Disease  05/10/16   Roseanne Kaufman, MD Consulting Physician Orthopedic Surgery  05/10/16   Garlan Fair, MD Consulting Physician Gastroenterology  05/10/16      Patient Active Problem List   Diagnosis Date Noted  . Adjustment disorder with mixed anxiety and depressed mood 08/02/2016    Priority: High  . Stented coronary artery 02/26/2015    Priority: High  . Coronary artery disease  04/08/2013    Priority: High  . Hyperlipidemia- intol to statins 06/14/2007    Priority: High  . COPD mixed type ([place]) 06/14/2007    Priority: High  . Lung nodule: benign-appearing, screening CT each December 07/11/2017    Priority: Medium  . Insomnia secondary to anxiety 05/10/2016    Priority: Medium  . Obstructive sleep apnea 08/26/2015    Priority: Medium  . Centrilobular emphysema ([place]) 06/24/2014    Priority: Medium  . post-operative Hypothyroidism 10/06/2006    Priority: Medium  . S/P total hysterectomy and bilateral salpingo-oophorectomy 12/30/2016    Priority: Low  . Myalgia/ generalized fibromyalgia 08/02/2016    Priority: Low  . Vitamin D deficiency 10/07/2009    Priority: Low  . Chronic pain syndrome 02/20/2009    Priority: Low  . COMMON MIGRAINE 02/20/2009    Priority: Low  . Fatigue- chronic 07/15/2008    Priority: Low  . GERD 10/06/2006    Priority: Low  . Drug intolerance- statin 03/21/2018  . Pure hypercholesterolemia 11/13/2017  . Generalized OA  11/13/2017  . Allergic Rhinosinusitis 11/13/2017  . Chronic cough due to COPD 11/13/2017  . Environmental and seasonal allergies-on Zyrtec, has reactive airway disease component seasonally 07/11/2017  . Senile solar keratosis 02/06/2017  . COPD with acute exacerbation ([place]) 01/11/2017  . ACS (acute coronary syndrome) 06/28/2016  . Crescendo angina ([place]) 06/28/2016  . Cough 04/28/2016  . Shingles rash 08/04/2015  . Allergic reaction 06/27/2015  . History of hemoptysis 06/27/2015  . Nocturnal hypoxemia 02/26/2015  . Hemoptysis 10/24/2014  . Soft tissue injury of multiple sites/bilateral hands 05/16/2013  . Bite from dog 05/15/2013  . Chest pain 05/15/2013  . COPD exacerbation ([place]) 12/31/2010  . Health care maintenance 12/25/2010  . MENOPAUSAL DISORDER 02/16/2010  . OSTEOARTHRITIS, KNEE, RIGHT 02/20/2009  . BACK PAIN 11/27/2008  . Memory loss 07/15/2008  . PERIPHERAL EDEMA 07/15/2008  . KNEE PAIN, BILATERAL 09/05/2007  . Seasonal and perennial allergic rhinitis 06/14/2007  . Anxiety state 10/06/2006  . Depression 10/06/2006    Past Medical history, Surgical history, Family history, Social history, Allergies and Medications have been entered into the medical record, reviewed and changed as needed.    Current Meds  Medication Sig  . acetaminophen (TYLENOL) 500 MG tablet Take 1,000 mg by mouth every 8 (eight) hours as needed (for pain or headahces).   Marland Kitchen amitriptyline (ELAVIL) 25 MG tablet TAKE 2 TABLETS BY MOUTH AT BEDTIME  . amLODipine (NORVASC) 2.5 MG tablet TAKE 1 TABLET (2.5 MG TOTAL) BY MOUTH DAILY.  Marland Kitchen aspirin EC 81 MG tablet Take 81 mg by mouth daily.  . B Complex Vitamins (VITAMIN B COMPLEX PO) Take 1 tablet by mouth daily.  Marland Kitchen buPROPion (WELLBUTRIN XL) 150 MG 24 hr tablet TAKE 1 TABLET EVERY DAY  . Cholecalciferol (VITAMIN D3) 5000 units CAPS Take 1 capsule by mouth daily.  . DULoxetine 40 MG CPEP Take 40 mg by mouth daily.  . fexofenadine (ALLEGRA) 180 MG tablet Take 1  tablet (180 mg total) by mouth daily.  Marland Kitchen levalbuterol (XOPENEX HFA) 45 MCG/ACT inhaler Inhale 2 puffs into the lungs every 6 (six) hours as needed for wheezing.  Marland Kitchen levothyroxine (SYNTHROID) 175 MCG tablet Take 1 tablet (175 mcg total) by mouth daily.  . meloxicam (MOBIC) 15 MG tablet Take 1 tablet (15 mg total) by mouth daily.  . nitroGLYCERIN (NITROSTAT) 0.4 MG SL tablet Place 0.4 mg under the tongue every 5 (five) minutes as needed for chest pain.  Marland Kitchen  omeprazole (PRILOSEC) 20 MG capsule Take 1 capsule (20 mg total) by mouth 2 (two) times daily before a meal.  . spironolactone (ALDACTONE) 25 MG tablet TAKE 1 TABLET (25 MG TOTAL) BY MOUTH DAILY.  . [DISCONTINUED] omeprazole (PRILOSEC) 20 MG capsule Take 1 capsule (20 mg total) by mouth daily. (Patient taking differently: Take 20 mg by mouth 2 (two) times daily before a meal. )    Allergies:  Allergies  Allergen Reactions  . Bactrim [Sulfamethoxazole-Trimethoprim] Shortness Of Breath, Anxiety and Palpitations  . Ketorolac Shortness Of Breath  . Anoro Ellipta [Umeclidinium-Vilanterol]     Per patient, caused painful blisters in her mouth.   . Morphine Other (See Comments)    headache  . Tetracyclines & Related Itching  . Valsartan Other (See Comments) and Cough    Headaches also   . Bystolic [Nebivolol Hcl] Other (See Comments)    Fatigue- Tired feeling  . Isosorbide Other (See Comments)    HEADACHES   . Levofloxacin Hives  . Lisinopril Cough  . Metoprolol Other (See Comments)    FATIGUE  . Morphine And Related Other (See Comments)    headache  . Simvastatin Other (See Comments)    Makes legs hurt/ sore   . Singulair [Montelukast Sodium] Palpitations  . Statins Other (See Comments)    MYALGIAS  . Tetracycline Hives and Itching  . Toradol [Ketorolac Tromethamine] Palpitations     Review of Systems:  A fourteen system review of systems was performed and found to be positive as per HPI.   Objective:   Blood pressure 127/89,  pulse 85, temperature 98.2 F (36.8 C), height 5\' 7"  (1.702 m), weight 194 lb 12.8 oz (88.4 kg), SpO2 95 %. Body mass index is 30.51 kg/m. General:  Well Developed, well nourished, appropriate for stated age.  Neuro:  Alert and oriented,  extra-ocular muscles intact  HEENT:  Normocephalic, atraumatic, neck supple, no carotid bruits appreciated  Skin:  no gross rash, warm, pink. Cardiac:  RRR, S1 S2 Respiratory:  ECTA B/L and A/P, Not using accessory muscles, speaking in full sentences- unlabored. Vascular:  Ext warm, no cyanosis apprec.; cap RF less 2 sec. Psych:  No HI/SI, judgement and insight good, Euthymic mood. Full Affect.

## 2018-03-25 ENCOUNTER — Other Ambulatory Visit: Payer: Self-pay | Admitting: Family Medicine

## 2018-03-25 DIAGNOSIS — E890 Postprocedural hypothyroidism: Secondary | ICD-10-CM

## 2018-03-26 ENCOUNTER — Other Ambulatory Visit: Payer: Self-pay | Admitting: Family Medicine

## 2018-03-26 DIAGNOSIS — M791 Myalgia, unspecified site: Secondary | ICD-10-CM

## 2018-03-26 DIAGNOSIS — G894 Chronic pain syndrome: Secondary | ICD-10-CM

## 2018-03-26 DIAGNOSIS — F5104 Psychophysiologic insomnia: Secondary | ICD-10-CM

## 2018-03-26 DIAGNOSIS — M159 Polyosteoarthritis, unspecified: Secondary | ICD-10-CM

## 2018-03-26 DIAGNOSIS — F4323 Adjustment disorder with mixed anxiety and depressed mood: Secondary | ICD-10-CM

## 2018-03-26 DIAGNOSIS — R5382 Chronic fatigue, unspecified: Secondary | ICD-10-CM

## 2018-04-16 NOTE — Progress Notes (Signed)
HPI: [id number]. Previously followed by Dr Einar Gip. Pt had NSTEMI 7/16 with complicated PCI of RCA (performed in [facility]). Echo 3/15 showed normal LV function, mild LVH, grade 1 DD, mild RVE. ABI 12/15 normal.Cath 6/16 showed 100 PDA, patent stents in proximal and mid RCA, no obstructive disease in the left system; collaterals to the PDA noted; PTCA of CTO unsuccessful.Last cath 10/17 showed 100 PDA (filled with left to right collaterals), patent RCA stent and normal LV function. H/O cough with ACEI. Myalgias with crestor.Dr Martinique reviewed and did not think attempt at CTO intervention or CABG was appropriate. CTA May 2019 showed stable 4 cm ascending thoracic aortic aneurysm.  Since last seen,  she has some dyspnea which she attributes to COPD.  Occasional chest pain both at rest and with exertion unchanged.  No syncope.  Current Outpatient Medications  Medication Sig Dispense Refill  . acetaminophen (TYLENOL) 500 MG tablet Take 1,000 mg by mouth every 8 (eight) hours as needed (for pain or headahces).     Marland Kitchen amitriptyline (ELAVIL) 25 MG tablet TAKE 2 TABLETS AT BEDTIME 180 tablet 1  . amLODipine (NORVASC) 2.5 MG tablet TAKE 1 TABLET (2.5 MG TOTAL) BY MOUTH DAILY. 90 tablet 1  . aspirin EC 81 MG tablet Take 81 mg by mouth daily.    . B Complex Vitamins (VITAMIN B COMPLEX PO) Take 1 tablet by mouth daily.    Marland Kitchen buPROPion (WELLBUTRIN XL) 150 MG 24 hr tablet TAKE 1 TABLET EVERY DAY 90 tablet 0  . Cholecalciferol (VITAMIN D3) 5000 units CAPS Take 1 capsule by mouth daily.    . DULoxetine HCl 40 MG CPEP TAKE 1 CAPSULE EVERY DAY 90 capsule 1  . fexofenadine (ALLEGRA) 180 MG tablet Take 1 tablet (180 mg total) by mouth daily. 90 tablet 3  . meloxicam (MOBIC) 15 MG tablet TAKE 1 TABLET (15 MG TOTAL) BY MOUTH DAILY. 90 tablet 1  . nitroGLYCERIN (NITROSTAT) 0.4 MG SL tablet Place 0.4 mg under the tongue every 5 (five) minutes as needed for chest pain.    Marland Kitchen omeprazole (PRILOSEC) 20 MG capsule Take 1 capsule (20  mg total) by mouth 2 (two) times daily before a meal. 180 capsule 1  . spironolactone (ALDACTONE) 25 MG tablet TAKE 1 TABLET (25 MG TOTAL) BY MOUTH DAILY. 90 tablet 1  . SYNTHROID 175 MCG tablet TAKE 1 TABLET BY MOUTH ONCE DAILY 90 tablet 1   No current facility-administered medications for this visit.      Past Medical History:  Diagnosis Date  . Allergy   . Anxiety   . COPD (chronic obstructive pulmonary disease) ([place])   . Coronary artery disease    a. s/p PCI of RCA in 2015 b. cath in 12/2015 showed 100% Ost RPDA with collaterals present  . DDD (degenerative disc disease), cervical   . Depression   . DJD (degenerative joint disease) of knee    RT  . GERD (gastroesophageal reflux disease)   . Hashimoto's disease   . Headache(784.0)   . Hyperlipidemia   . Hypertension   . Hypothyroidism   . Myocardial infarction ([place])   . Nocturnal hypoxemia 02/26/2015  . On supplemental oxygen therapy    Oxygen 2.5 l/m at bedtime.  . Stented coronary artery 02/26/2015    Past Surgical History:  Procedure Laterality Date  . ABDOMINAL HYSTERECTOMY    . APPENDECTOMY  1971  . BUNIONECTOMY Right   . CARDIAC CATHETERIZATION N/A 09/02/2014   Procedure: Left Heart  Cath and Coronary Angiography;  Surgeon: Adrian Prows, MD;  Location: [facility];  Service: Cardiovascular;  Laterality: N/A;  . CARDIAC CATHETERIZATION N/A 12/15/2015   Procedure: Left Heart Cath and Coronary Angiography;  Surgeon: Jettie Booze, MD;  Location: [facility];  Service: Cardiovascular;  Laterality: N/A;  . CERVICAL FUSION    . CESAREAN SECTION    . COLONOSCOPY WITH PROPOFOL N/A 02/24/2015   Procedure: COLONOSCOPY WITH PROPOFOL;  Surgeon: Garlan Fair, MD;  Location: [facility];  Service: Endoscopy;  Laterality: N/A;  . ESOPHAGOGASTRODUODENOSCOPY (EGD) WITH PROPOFOL N/A 02/24/2015   Procedure: ESOPHAGOGASTRODUODENOSCOPY (EGD) WITH PROPOFOL;  Surgeon: Garlan Fair, MD;  Location: [facility];   Service: Endoscopy;  Laterality: N/A;  . HAND SURGERY     due to trauma-dog bite.  . I&D EXTREMITY Bilateral 05/17/2013   Procedure: IRRIGATION AND DEBRIDEMENT EXTREMITY;  Surgeon: Roseanne Kaufman, MD;  Location: [facility];  Service: Orthopedics;  Laterality: Bilateral;  . LEFT HEART CATHETERIZATION WITH CORONARY ANGIOGRAM N/A 03/04/2014   Procedure: LEFT HEART CATHETERIZATION WITH CORONARY ANGIOGRAM;  Surgeon: Laverda Page, MD;  Location: [facility];  Service: Cardiovascular;  Laterality: N/A;  . OOPHORECTOMY    . SHOULDER SURGERY     RT  . THYROIDECTOMY  1996  . TONSILLECTOMY  1965  . TOTAL ABDOMINAL HYSTERECTOMY W/ BILATERAL SALPINGOOPHORECTOMY  01/2008   with cervical dysplasia  . TUBAL LIGATION  1984    Social History   Socioeconomic History  . Marital status: Married    Spouse name: Not on file  . Number of children: 1  . Years of education: Not on file  . Highest education level: Not on file  Occupational History  . Occupation: disabled    Employer: [employer]  Social Needs  . Financial resource strain: Not on file  . Food insecurity:    Worry: Not on file    Inability: Not on file  . Transportation needs:    Medical: Not on file    Non-medical: Not on file  Tobacco Use  . Smoking status: Former Smoker    Packs/day: 2.00    Years: 25.00    Pack years: 50.00    Types: Cigarettes    Last attempt to quit: 04/23/1996    Years since quitting: 22.0  . Smokeless tobacco: Never Used  Substance and Sexual Activity  . Alcohol use: No    Alcohol/week: 0.0 standard drinks  . Drug use: No  . Sexual activity: Not Currently  Lifestyle  . Physical activity:    Days per week: Not on file    Minutes per session: Not on file  . Stress: Not on file  Relationships  . Social connections:    Talks on phone: Not on file    Gets together: Not on file    Attends religious service: Not on file    Active member of club or organization: Not on file    Attends meetings of clubs  or organizations: Not on file    Relationship status: Not on file  . Intimate partner violence:    Fear of current or ex partner: Not on file    Emotionally abused: Not on file    Physically abused: Not on file    Forced sexual activity: Not on file  Other Topics Concern  . Not on file  Social History Narrative   On worker's comp for lower back and right shoulder - applying for disability; losing [employer] insurance 03/07/2010  Family History  Problem Relation Age of Onset  . Lung cancer Mother   . Hypertension Mother   . Stroke Mother   . Diabetes Mother   . Diabetes Father   . Hypertension Father   . Heart attack Father   . Asthma Father   . COPD Father   . Alcohol abuse Father   . Diabetes Sister   . Hypertension Sister     ROS: no fevers or chills, productive cough, hemoptysis, dysphasia, odynophagia, melena, hematochezia, dysuria, hematuria, rash, seizure activity, orthopnea, PND, pedal edema, claudication. Remaining systems are negative.  Physical Exam: Well-developed well-nourished in no acute distress.  Skin is warm and dry.  HEENT is normal.  Neck is supple.  Chest is clear to auscultation with normal expansion.  Cardiovascular exam is regular rate and rhythm.  Abdominal exam nontender or distended. No masses palpated. Extremities show no edema. neuro grossly intact  ECG-normal sinus rhythm at a rate of 84, no significant ST changes.  Personally reviewed  A/P  1 chest pain-her chest pain persists but essentially unchanged.  As outlined in previous notes CTO was not felt to be a good option.  It was also felt that coronary artery bypass graft would not be a good option.  Continue medical therapy with calcium blocker.  She did not tolerate nitrates, beta-blocker or Ranexa previously.  2 coronary artery disease-continue aspirin.  Patient is intolerant to statins.  3 hypertension-patient's blood pressure is controlled today.  Plan to continue present medications  and follow. Check K and renal function.   4 hyperlipidemia-patient did not tolerate statins or Zetia.  Last LDL 119.  Will refer for Repatha.  5 history of dilated aortic root-she will need repeat CTA May 2020.  Kirk Ruths, MD

## 2018-04-30 ENCOUNTER — Ambulatory Visit: Payer: Medicare HMO | Admitting: Cardiology

## 2018-04-30 ENCOUNTER — Encounter: Payer: Self-pay | Admitting: Cardiology

## 2018-04-30 VITALS — BP 126/86 | HR 86 | Ht 66.5 in | Wt 195.0 lb

## 2018-04-30 DIAGNOSIS — E7800 Pure hypercholesterolemia, unspecified: Secondary | ICD-10-CM | POA: Diagnosis not present

## 2018-04-30 DIAGNOSIS — I2510 Atherosclerotic heart disease of native coronary artery without angina pectoris: Secondary | ICD-10-CM | POA: Diagnosis not present

## 2018-04-30 DIAGNOSIS — R072 Precordial pain: Secondary | ICD-10-CM

## 2018-04-30 DIAGNOSIS — I10 Essential (primary) hypertension: Secondary | ICD-10-CM | POA: Diagnosis not present

## 2018-04-30 LAB — BASIC METABOLIC PANEL
BUN/Creatinine Ratio: 21 (ref 12–28)
BUN: 18 mg/dL (ref 8–27)
CO2: 23 mmol/L (ref 20–29)
Calcium: 9.3 mg/dL (ref 8.7–10.3)
Chloride: 102 mmol/L (ref 96–106)
Creatinine, Ser: 0.86 mg/dL (ref 0.57–1.00)
GFR calc Af Amer: 85 mL/min/{1.73_m2} (ref >59)
GFR calc non Af Amer: 74 mL/min/{1.73_m2} (ref >59)
Glucose: 71 mg/dL (ref 65–99)
Potassium: 4.8 mmol/L (ref 3.5–5.2)
Sodium: 140 mmol/L (ref 134–144)

## 2018-04-30 NOTE — Patient Instructions (Signed)
Medication Instructions:  NO CHANGE If you need a refill on your cardiac medications before your next appointment, please call your pharmacy.   Lab work Your physician recommends that you HAVE LAB [place] If you have labs (blood work) drawn today and your tests are completely normal, you will receive your results only by: Marland Kitchen MyChart Message (if you have MyChart) OR . A paper copy in the mail If you have any lab test that is abnormal or we need to change your treatment, we will call you to review the results.  Follow-Up: At [facility], you and your health needs are our priority.  As part of our continuing mission to provide you with exceptional heart care, we have created designated Provider Care Teams.  These Care Teams include your primary Cardiologist (physician) and Advanced Practice Providers (APPs -  Physician Assistants and Nurse Practitioners) who all work together to provide you with the care you need, when you need it. You will need a follow up appointment in 12 months.  Please call our office 2 months in advance to schedule this appointment.  You may see Kirk Ruths MD or one of the following Advanced Practice Providers on your designated Care Team:   Kerin Ransom, PA-C Roby Lofts, [place] . Sande Rives, PA-C  REFERRAL TO PHARM MD [place]

## 2018-05-04 ENCOUNTER — Ambulatory Visit: Payer: Medicare HMO | Admitting: Internal Medicine

## 2018-05-17 ENCOUNTER — Ambulatory Visit (INDEPENDENT_AMBULATORY_CARE_PROVIDER_SITE_OTHER): Payer: Medicare HMO | Admitting: Pharmacist Clinician (PhC)/ Clinical Pharmacy Specialist

## 2018-05-17 ENCOUNTER — Other Ambulatory Visit: Payer: Self-pay | Admitting: Pharmacist Clinician (PhC)/ Clinical Pharmacy Specialist

## 2018-05-17 ENCOUNTER — Other Ambulatory Visit: Payer: Self-pay

## 2018-05-17 DIAGNOSIS — E7800 Pure hypercholesterolemia, unspecified: Secondary | ICD-10-CM

## 2018-05-17 LAB — LIPID PANEL
Chol/HDL Ratio: 3.9 ratio (ref 0.0–4.4)
Cholesterol, Total: 183 mg/dL (ref 100–199)
HDL: 47 mg/dL (ref >39)
LDL Calculated: 103 mg/dL — ABNORMAL HIGH (ref 0–99)
Triglycerides: 167 mg/dL — ABNORMAL HIGH (ref 0–149)
VLDL Cholesterol Cal: 33 mg/dL (ref 5–40)

## 2018-05-17 NOTE — Patient Instructions (Signed)
Evolocumab injection What is this medicine? EVOLOCUMAB (e voe LOK ue mab) is known as a PCSK9 inhibitor. It is used to lower the level of cholesterol in the blood. It may be used alone or in combination with other cholesterol-lowering drugs. This drug may also be used to reduce the risk of heart attack, stroke, and certain types of heart surgery in patients with heart disease. This medicine may be used for other purposes; ask your health care provider or pharmacist if you have questions. COMMON BRAND NAME(S): Repatha What should I tell my health care provider before I take this medicine? They need to know if you have any of these conditions: -an unusual or allergic reaction to evolocumab, other medicines, foods, dyes, or preservatives -pregnant or trying to get pregnant -breast-feeding How should I use this medicine? This medicine is for injection under the skin. You will be taught how to prepare and give this medicine. Use exactly as directed. Take your medicine at regular intervals. Do not take your medicine more often than directed. It is important that you put your used needles and syringes in a special sharps container. Do not put them in a trash can. If you do not have a sharps container, call your pharmacist or health care provider to get one. Talk to your pediatrician regarding the use of this medicine in children. While this drug may be prescribed for children as young as 13 years for selected conditions, precautions do apply. Overdosage: If you think you have taken too much of this medicine contact a poison control center or emergency room at once. NOTE: This medicine is only for you. Do not share this medicine with others. What if I miss a dose? If you miss a dose, take it as soon as you can if there are more than 7 days until the next scheduled dose, or skip the missed dose and take the next dose according to your original schedule. Do not take double or extra doses. What may interact  with this medicine? Interactions are not expected. This list may not describe all possible interactions. Give your health care provider a list of all the medicines, herbs, non-prescription drugs, or dietary supplements you use. Also tell them if you smoke, drink alcohol, or use illegal drugs. Some items may interact with your medicine. What should I watch for while using this medicine? You may need blood work while you are taking this medicine. What side effects may I notice from receiving this medicine? Side effects that you should report to your doctor or health care professional as soon as possible: -allergic reactions like skin rash, itching or hives, swelling of the face, lips, or tongue -signs and symptoms of high blood sugar such as dizziness; dry mouth; dry skin; fruity breath; nausea; stomach pain; increased hunger or thirst; increased urination -signs and symptoms of infection like fever or chills; cough; sore throat; pain or trouble passing urine Side effects that usually do not require medical attention (report to your doctor or health care professional if they continue or are bothersome): -diarrhea -nausea -muscle pain -pain, redness, or irritation at site where injected This list may not describe all possible side effects. Call your doctor for medical advice about side effects. You may report side effects to FDA at 1-800-FDA-1088. Where should I keep my medicine? Keep out of the reach of children. You will be instructed on how to store this medicine. Throw away any unused medicine after the expiration date on the label. NOTE: This sheet  is a summary. It may not cover all possible information. If you have questions about this medicine, talk to your doctor, pharmacist, or health care provider.  2019 Elsevier/Gold Standard (2016-10-03 13:31:00)

## 2018-05-17 NOTE — Progress Notes (Signed)
05/18/2018 Amy Hodge 01/12/1956 [id number]   HPI:  Amy Hodge is a 63 y.o. female patient of Dr Stanford Breed, who presents today for a lipid clinic evaluation.  In addition to hyperlipidemia, her medical history is significant for NSTEMI (2015, with PCI to RCA), COPD, fibromyalgia and OSA.    At the time of her NSTEMI, a heart cath showed 100% blocked PDA with collaterals and patent stents in both the proximal and mid RCA.  She has tried multiple statins as well as red-yeast rice, all causing myalgias.    Current Medications: none  Cholesterol Goals: LDL < 70   Intolerant/previously tried:  Epic indicates patient has tried pravastatin, rosuvastatin and simvastatin, all causing myalgias.  She was also in a lipid trial with Dr. Ulice Dash Ganji/Medication Management for bempedoic acid and had to discontinue because of myalgias.    Family history:    Father - hypertension, MI, DM, alcohol abuse  Mother - hypertension, stroke, DM, lung cancer  Sister - hypertension, DM  Diet:   Does not usually eat breakfast; no eggs;  Has cut out fatty foods and breakfast meats.  Does not each much in the way of rice or potatoes.  States gets plenty of vegetables.   Exercise:  Gym in [place] 30 min on eliptical (screen shows a burn of 200 calories) - tries to do 5 days per week, some weeks may only make in 2-3 times  Labs:   11/2017: TC 195, TG 165, HDL 43, LDL 119    Current Outpatient Medications  Medication Sig Dispense Refill  . acetaminophen (TYLENOL) 500 MG tablet Take 1,000 mg by mouth every 8 (eight) hours as needed (for pain or headahces).     Marland Kitchen amitriptyline (ELAVIL) 25 MG tablet TAKE 2 TABLETS AT BEDTIME 180 tablet 1  . amLODipine (NORVASC) 2.5 MG tablet TAKE 1 TABLET (2.5 MG TOTAL) BY MOUTH DAILY. 90 tablet 1  . aspirin EC 81 MG tablet Take 81 mg by mouth daily.    . B Complex Vitamins (VITAMIN B COMPLEX PO) Take 1 tablet by mouth daily.    Marland Kitchen buPROPion (WELLBUTRIN XL) 150 MG 24 hr tablet  TAKE 1 TABLET EVERY DAY 90 tablet 0  . Cholecalciferol (VITAMIN D3) 5000 units CAPS Take 1 capsule by mouth daily.    . DULoxetine HCl 40 MG CPEP TAKE 1 CAPSULE EVERY DAY 90 capsule 1  . fexofenadine (ALLEGRA) 180 MG tablet Take 1 tablet (180 mg total) by mouth daily. 90 tablet 3  . meloxicam (MOBIC) 15 MG tablet TAKE 1 TABLET (15 MG TOTAL) BY MOUTH DAILY. 90 tablet 1  . nitroGLYCERIN (NITROSTAT) 0.4 MG SL tablet Place 0.4 mg under the tongue every 5 (five) minutes as needed for chest pain.    Marland Kitchen omeprazole (PRILOSEC) 20 MG capsule Take 1 capsule (20 mg total) by mouth 2 (two) times daily before a meal. 180 capsule 1  . spironolactone (ALDACTONE) 25 MG tablet TAKE 1 TABLET (25 MG TOTAL) BY MOUTH DAILY. 90 tablet 1  . SYNTHROID 175 MCG tablet TAKE 1 TABLET BY MOUTH ONCE DAILY 90 tablet 1   No current facility-administered medications for this visit.     Allergies  Allergen Reactions  . Bactrim [Sulfamethoxazole-Trimethoprim] Shortness Of Breath, Anxiety and Palpitations  . Ketorolac Shortness Of Breath  . Anoro Ellipta [Umeclidinium-Vilanterol]     Per patient, caused painful blisters in her mouth.   . Morphine Other (See Comments)    headache  . Tetracyclines & Related Itching  .  Valsartan Other (See Comments) and Cough    Headaches also   . Bystolic [Nebivolol Hcl] Other (See Comments)    Fatigue- Tired feeling  . Isosorbide Other (See Comments)    HEADACHES   . Levofloxacin Hives  . Lisinopril Cough  . Metoprolol Other (See Comments)    FATIGUE  . Morphine And Related Other (See Comments)    headache  . Simvastatin Other (See Comments)    Makes legs hurt/ sore   . Singulair [Montelukast Sodium] Palpitations  . Statins Other (See Comments)    MYALGIAS  . Tetracycline Hives and Itching  . Toradol [Ketorolac Tromethamine] Palpitations    Past Medical History:  Diagnosis Date  . Allergy   . Anxiety   . COPD (chronic obstructive pulmonary disease) ([place])   . Coronary  artery disease    a. s/p PCI of RCA in 2015 b. cath in 12/2015 showed 100% Ost RPDA with collaterals present  . DDD (degenerative disc disease), cervical   . Depression   . DJD (degenerative joint disease) of knee    RT  . GERD (gastroesophageal reflux disease)   . Hashimoto's disease   . Headache(784.0)   . Hyperlipidemia   . Hypertension   . Hypothyroidism   . Myocardial infarction ([place])   . Nocturnal hypoxemia 02/26/2015  . On supplemental oxygen therapy    Oxygen 2.5 l/m at bedtime.  . Stented coronary artery 02/26/2015    There were no vitals taken for this visit.   Hyperlipidemia- intol to statins Patient with ASCVD and LDL not to goal of < 70.  She has not been able to tolerate multiple statin drugs or bempedoic acid (clinical trial).  Will start paperwork to get Repatha covered for her.  She would prefer the monthly Pushtronix device.  Because her labs are 32 months old, will have her go to lab today for repeat lipids.  Patient aware of mechanism as well as potential side effects of Repatha.  She can call the office for a training session once the prescription is approved.    Tommy Medal PharmD CPP [facility] [address] [place], [place] [address]

## 2018-05-18 NOTE — Assessment & Plan Note (Signed)
>>  ASSESSMENT AND PLAN FOR HYPERLIPIDEMIA- INTOL TO STATINS WRITTEN ON 05/18/2018  8:02 AM BY ALVSTAD, KRISTIN L, RPH-CPP  Patient with ASCVD and LDL not to goal of < 70.  She has not been able to tolerate multiple statin drugs or bempedoic acid (clinical trial).  Will start paperwork to get Repatha covered for her.  She would prefer the monthly Pushtronix device.  Because her labs are 47 months old, will have her go to lab today for repeat lipids.  Patient aware of mechanism as well as potential side effects of Repatha.  She can call the office for a training session once the prescription is approved.

## 2018-05-18 NOTE — Assessment & Plan Note (Signed)
Patient with ASCVD and LDL not to goal of < 70.  She has not been able to tolerate multiple statin drugs or bempedoic acid (clinical trial).  Will start paperwork to get Repatha covered for her.  She would prefer the monthly Pushtronix device.  Because her labs are 5 months old, will have her go to lab today for repeat lipids.  Patient aware of mechanism as well as potential side effects of Repatha.  She can call the office for a training session once the prescription is approved.

## 2018-05-23 ENCOUNTER — Other Ambulatory Visit: Payer: Self-pay | Admitting: Cardiology

## 2018-05-23 ENCOUNTER — Other Ambulatory Visit: Payer: Self-pay | Admitting: Family Medicine

## 2018-05-23 ENCOUNTER — Telehealth: Payer: Self-pay

## 2018-05-23 DIAGNOSIS — F4323 Adjustment disorder with mixed anxiety and depressed mood: Secondary | ICD-10-CM

## 2018-05-23 MED ORDER — EVOLOCUMAB 140 MG/ML ~~LOC~~ SOAJ: 140.0000 mg | SUBCUTANEOUS | 6 refills | Status: DC

## 2018-05-23 NOTE — Telephone Encounter (Signed)
PA for repatha 140mg  approved.  Rx sent to prefer pharmacy

## 2018-05-23 NOTE — Telephone Encounter (Signed)
Called the patient to ask which pharmacy they would like the repatha sent to and they stated [facility] [place] [facility] sent. The pt asked for the cost but that isn't something we can tell her advised pt to call before going and if unaffordable, to call me back.

## 2018-06-20 ENCOUNTER — Other Ambulatory Visit: Payer: Self-pay

## 2018-06-20 ENCOUNTER — Ambulatory Visit (INDEPENDENT_AMBULATORY_CARE_PROVIDER_SITE_OTHER): Payer: Medicare HMO | Admitting: Family Medicine

## 2018-06-20 ENCOUNTER — Encounter: Payer: Self-pay | Admitting: Family Medicine

## 2018-06-20 VITALS — BP 140/88 | HR 89 | Temp 98.4°F | Ht 66.5 in | Wt 195.0 lb

## 2018-06-20 DIAGNOSIS — R300 Dysuria: Secondary | ICD-10-CM | POA: Diagnosis not present

## 2018-06-20 DIAGNOSIS — M62830 Muscle spasm of back: Secondary | ICD-10-CM | POA: Diagnosis not present

## 2018-06-20 DIAGNOSIS — R350 Frequency of micturition: Secondary | ICD-10-CM

## 2018-06-20 LAB — POCT URINALYSIS DIPSTICK
Bilirubin, UA: NEGATIVE
Blood, UA: NEGATIVE
Glucose, UA: NEGATIVE
Ketones, UA: NEGATIVE
Leukocytes, UA: NEGATIVE
Nitrite, UA: NEGATIVE
Protein, UA: NEGATIVE
Spec Grav, UA: 1.01 (ref 1.010–1.025)
Urobilinogen, UA: 0.2 E.U./dL
pH, UA: 7 (ref 5.0–8.0)

## 2018-06-20 MED ORDER — CYCLOBENZAPRINE HCL 10 MG PO TABS: 10.0000 mg | ORAL_TABLET | Freq: Three times a day (TID) | ORAL | 0 refills | Status: DC | PRN

## 2018-06-20 NOTE — Progress Notes (Signed)
Virtual Visit via Telephone Note for [employer], D.O- Primary Care Physician at [facility]   I connected with current patient today by telephone and verified that I am speaking with the correct person using two identifiers.   Because of federal recommendations of social distancing due to the current novel COVID-19 outbreak, an audio/video telehealth visit is felt to be most appropriate for this patient at this time.  My staff members also discussed with the patient that there may be a patient charge related to this service.   The patient expressed understanding, and agreed to proceed.    History of Present Illness:    Hurt back about 1 wk ago picking up tree limbs- took family members tivanidine/ zanaflex, 4mg  every 4 hrs.    Thinks she has a uti.  Hurts to pee, no itch , no vaginal d/c , no rash.  +Inc freq,  + urinary urgency, no f/c, n /v abd pain etc.     Wt Readings from Last 3 Encounters:  06/20/18 195 lb (88.5 kg)  04/30/18 195 lb (88.5 kg)  03/21/18 194 lb 12.8 oz (88.4 kg)    BP Readings from Last 3 Encounters:  06/20/18 140/88  04/30/18 126/86  03/21/18 127/89    Pulse Readings from Last 3 Encounters:  06/20/18 89  04/30/18 86  03/21/18 85    BMI Readings from Last 3 Encounters:  06/20/18 31.00 kg/m  04/30/18 31.00 kg/m  03/21/18 30.51 kg/m     -Vitals obtained; Medications, allergies reconciled;  personal medical, social, Sx etc. etc. histories were updated by Lanier Prude the medical assistant today and are reflected in below chart   Patient Care Team    Relationship Specialty Notifications Start End  Mellody Dance, DO PCP - General Family Medicine  07/05/16   Lelon Perla, MD Consulting Physician Cardiology  05/10/16   Deneise Lever, MD Consulting Physician Pulmonary Disease  05/10/16   Roseanne Kaufman, MD Consulting Physician Orthopedic Surgery  05/10/16   Garlan Fair, MD Consulting Physician Gastroenterology  05/10/16      Patient Active Problem List   Diagnosis Date Noted  . Adjustment disorder with mixed anxiety and depressed mood 08/02/2016    Priority: High  . Stented coronary artery 02/26/2015    Priority: High  . Coronary artery disease 04/08/2013    Priority: High  . Hyperlipidemia- intol to statins 06/14/2007    Priority: High  . COPD mixed type ([place]) 06/14/2007    Priority: High  . Lung nodule: benign-appearing, screening CT each December 07/11/2017    Priority: Medium  . Insomnia secondary to anxiety 05/10/2016    Priority: Medium  . Obstructive sleep apnea 08/26/2015    Priority: Medium  . Centrilobular emphysema ([place]) 06/24/2014    Priority: Medium  . post-operative Hypothyroidism 10/06/2006    Priority: Medium  . S/P total hysterectomy and bilateral salpingo-oophorectomy 12/30/2016    Priority: Low  . Myalgia/ generalized fibromyalgia 08/02/2016    Priority: Low  . Vitamin D deficiency 10/07/2009    Priority: Low  . Chronic pain syndrome 02/20/2009    Priority: Low  . COMMON MIGRAINE 02/20/2009    Priority: Low  . Fatigue- chronic 07/15/2008    Priority: Low  . GERD 10/06/2006    Priority: Low  . Spasm of muscle of lower back 06/20/2018  . Dysuria  06/20/2018  . Drug intolerance- statin 03/21/2018  . Pure hypercholesterolemia 11/13/2017  . Generalized OA 11/13/2017  .  Allergic Rhinosinusitis 11/13/2017  . Chronic cough due to COPD 11/13/2017  . Environmental and seasonal allergies-on Zyrtec, has reactive airway disease component seasonally 07/11/2017  . Senile solar keratosis 02/06/2017  . COPD with acute exacerbation ([place]) 01/11/2017  . ACS (acute coronary syndrome) 06/28/2016  . Crescendo angina ([place]) 06/28/2016  . Cough 04/28/2016  . Shingles rash 08/04/2015  . Allergic reaction 06/27/2015  . History of hemoptysis 06/27/2015  . Nocturnal hypoxemia 02/26/2015  . Hemoptysis 10/24/2014  . Soft tissue injury of multiple sites/bilateral hands 05/16/2013  . Bite  from dog 05/15/2013  . Chest pain 05/15/2013  . COPD exacerbation ([place]) 12/31/2010  . Health care maintenance 12/25/2010  . MENOPAUSAL DISORDER 02/16/2010  . OSTEOARTHRITIS, KNEE, RIGHT 02/20/2009  . BACK PAIN 11/27/2008  . Memory loss 07/15/2008  . PERIPHERAL EDEMA 07/15/2008  . KNEE PAIN, BILATERAL 09/05/2007  . Seasonal and perennial allergic rhinitis 06/14/2007  . Anxiety state 10/06/2006  . Depression 10/06/2006     Current Meds  Medication Sig  . acetaminophen (TYLENOL) 500 MG tablet Take 1,000 mg by mouth every 8 (eight) hours as needed (for pain or headahces).   Marland Kitchen amitriptyline (ELAVIL) 25 MG tablet TAKE 2 TABLETS AT BEDTIME  . amLODipine (NORVASC) 2.5 MG tablet TAKE 1 TABLET (2.5 MG TOTAL) BY MOUTH DAILY.  Marland Kitchen aspirin EC 81 MG tablet Take 81 mg by mouth daily.  . B Complex Vitamins (VITAMIN B COMPLEX PO) Take 1 tablet by mouth daily.  Marland Kitchen buPROPion (WELLBUTRIN XL) 150 MG 24 hr tablet TAKE 1 TABLET EVERY DAY  . Cholecalciferol (VITAMIN D3) 5000 units CAPS Take 1 capsule by mouth daily.  . DULoxetine HCl 40 MG CPEP TAKE 1 CAPSULE EVERY DAY  . Evolocumab (REPATHA SURECLICK) 932 MG/ML SOAJ Inject 140 mg into the skin every 14 (fourteen) days.  . fexofenadine (ALLEGRA) 180 MG tablet Take 1 tablet (180 mg total) by mouth daily.  . meloxicam (MOBIC) 15 MG tablet TAKE 1 TABLET (15 MG TOTAL) BY MOUTH DAILY.  . nitroGLYCERIN (NITROSTAT) 0.4 MG SL tablet Place 0.4 mg under the tongue every 5 (five) minutes as needed for chest pain.  Marland Kitchen omeprazole (PRILOSEC) 20 MG capsule Take 1 capsule (20 mg total) by mouth 2 (two) times daily before a meal.  . Phenazopyridine HCl (AZO TABS PO) Take by mouth.  . spironolactone (ALDACTONE) 25 MG tablet TAKE 1 TABLET EVERY DAY  . SYNTHROID 175 MCG tablet TAKE 1 TABLET BY MOUTH ONCE DAILY  . TIZANIDINE HCL PO Take by mouth.     Allergies:  Allergies  Allergen Reactions  . Bactrim [Sulfamethoxazole-Trimethoprim] Shortness Of Breath, Anxiety and  Palpitations  . Ketorolac Shortness Of Breath  . Anoro Ellipta [Umeclidinium-Vilanterol]     Per patient, caused painful blisters in her mouth.   . Morphine Other (See Comments)    headache  . Tetracyclines & Related Itching  . Valsartan Other (See Comments) and Cough    Headaches also   . Bystolic [Nebivolol Hcl] Other (See Comments)    Fatigue- Tired feeling  . Isosorbide Other (See Comments)    HEADACHES   . Levofloxacin Hives  . Lisinopril Cough  . Metoprolol Other (See Comments)    FATIGUE  . Morphine And Related Other (See Comments)    headache  . Simvastatin Other (See Comments)    Makes legs hurt/ sore   . Singulair [Montelukast Sodium] Palpitations  . Statins Other (See Comments)    MYALGIAS  . Tetracycline Hives and Itching  . Toradol [  Ketorolac Tromethamine] Palpitations     ROS:  See above HPI for pertinent positives and negatives   Objective:   Blood pressure 140/88, pulse 89, temperature 98.4 F (36.9 C), height 5' 6.5" (1.689 m), weight 195 lb (88.5 kg). (if some vitals are omitted, this means that patient was UNABLE to obtain them even though asked to get them prior to OV today) General: sounds in no acute distress.  Skin: Pt confirms warm and dry  extremities and pink fingertips Respiratory: speaking in full sentences, no conversational dyspnea Psych: A and O *3, appears insight good, mood- full      Impression and Recommendations:      ICD-10-CM   1. Frequent urination R35.0 POCT urinalysis dipstick    Urine Culture  2. Dysuria  R30.0 Urine Culture  3. Spasm of muscle of lower back M62.830 cyclobenzaprine (FLEXERIL) 10 MG tablet     -For her muscle spasms of her back after picking up a bunch of twigs, since she did take Zanaflex of a family members and it work well for her pain relief, I prescribed Flexeril for her since it only needs to be taken 3 times a day instead of every 4 hours.  Potential side effects of medicine reviewed with patient  and told her to only take as needed.  - So for her urinary frequency as well as dysuria, with a clean UA, we will still send for culture to double check.  However if the urine culture comes back negative due to her repeated symptoms which she once had in early February as well with a negative UA, we will then send her to urology.   -Patient was reminded to please keep her chronic follow-ups with me in addition to these acute so we can address her chronic medical issues and make sure they are well controlled.   -As part of my medical decision making, I reviewed the following data within the [place] History obtained from pt/family, CMA notes reviewed and incorporated, Labs reviewed, Radiograph/ tests reviewed if applicable and OV notes from prior OV's with me, as well as other specialists he has seen since seeing me last, were all reviewed and used in my medical decision making process today. Additionally, discussion had with patient regarding txmnt plan, their biases about that plan etc were used in my medical decision making today.  I discussed the assessment and treatment plan with the patient. The patient was provided an opportunity to ask questions and all were answered.  The patient agreed with the plan and demonstrated an understanding of the instructions.   No barriers to understanding were identified.  Red flag symptoms and signs discussed in detail.  Patient expressed understanding regarding what to do in case of emergency\urgent symptoms   The patient was advised to call back or seek an in-person evaluation if the symptoms worsen or if the condition fails to improve as anticipated.   Return if symptoms worsen or fail to improve- Also please, for F-up of current med issues as previously d/c pt.    Orders Placed This Encounter  Procedures  . Urine Culture  . POCT urinalysis dipstick    Meds ordered this encounter  Medications  . cyclobenzaprine (FLEXERIL) 10 MG  tablet    Sig: Take 1 tablet (10 mg total) by mouth 3 (three) times daily as needed for muscle spasms.    Dispense:  30 tablet    Refill:  0    **Gross side effects, risk and  benefits, and alternatives of medications and treatment plan in general discussed with patient.  Patient is aware that all medications have potential side effects and we are unable to predict every side effect or drug-drug interaction that may occur.   Patient was strongly encouraged to call with any questions or concerns they may have concerns.     I provided 12+ minutes of non-face-to-face time during this encounter.   Mellody Dance, DO

## 2018-06-22 LAB — URINE CULTURE

## 2018-07-10 ENCOUNTER — Other Ambulatory Visit: Payer: Self-pay | Admitting: Family Medicine

## 2018-07-10 DIAGNOSIS — F4323 Adjustment disorder with mixed anxiety and depressed mood: Secondary | ICD-10-CM

## 2018-07-10 DIAGNOSIS — R5382 Chronic fatigue, unspecified: Secondary | ICD-10-CM

## 2018-07-10 DIAGNOSIS — M791 Myalgia, unspecified site: Secondary | ICD-10-CM

## 2018-07-10 DIAGNOSIS — G894 Chronic pain syndrome: Secondary | ICD-10-CM

## 2018-07-14 DIAGNOSIS — H524 Presbyopia: Secondary | ICD-10-CM | POA: Diagnosis not present

## 2018-07-19 ENCOUNTER — Encounter: Payer: Self-pay | Admitting: Family Medicine

## 2018-07-19 ENCOUNTER — Ambulatory Visit (INDEPENDENT_AMBULATORY_CARE_PROVIDER_SITE_OTHER): Payer: Medicare HMO | Admitting: Family Medicine

## 2018-07-19 ENCOUNTER — Other Ambulatory Visit: Payer: Self-pay

## 2018-07-19 VITALS — Ht 66.5 in | Wt 192.0 lb

## 2018-07-19 DIAGNOSIS — Z90722 Acquired absence of ovaries, bilateral: Secondary | ICD-10-CM

## 2018-07-19 DIAGNOSIS — E7800 Pure hypercholesterolemia, unspecified: Secondary | ICD-10-CM

## 2018-07-19 DIAGNOSIS — M159 Polyosteoarthritis, unspecified: Secondary | ICD-10-CM | POA: Diagnosis not present

## 2018-07-19 DIAGNOSIS — J449 Chronic obstructive pulmonary disease, unspecified: Secondary | ICD-10-CM

## 2018-07-19 DIAGNOSIS — F411 Generalized anxiety disorder: Secondary | ICD-10-CM

## 2018-07-19 DIAGNOSIS — R5382 Chronic fatigue, unspecified: Secondary | ICD-10-CM

## 2018-07-19 DIAGNOSIS — I2510 Atherosclerotic heart disease of native coronary artery without angina pectoris: Secondary | ICD-10-CM | POA: Diagnosis not present

## 2018-07-19 DIAGNOSIS — G4733 Obstructive sleep apnea (adult) (pediatric): Secondary | ICD-10-CM

## 2018-07-19 DIAGNOSIS — F320 Major depressive disorder, single episode, mild: Secondary | ICD-10-CM | POA: Diagnosis not present

## 2018-07-19 DIAGNOSIS — Z9079 Acquired absence of other genital organ(s): Secondary | ICD-10-CM

## 2018-07-19 DIAGNOSIS — Z90710 Acquired absence of both cervix and uterus: Secondary | ICD-10-CM

## 2018-07-19 DIAGNOSIS — E890 Postprocedural hypothyroidism: Secondary | ICD-10-CM

## 2018-07-19 DIAGNOSIS — M62830 Muscle spasm of back: Secondary | ICD-10-CM

## 2018-07-19 NOTE — Progress Notes (Signed)
Virtual / live video office visit note for [employer], D.O- Primary Care Physician at [facility]   I connected with current patient today and beyond visually recognizing the correct individual, I verified that I am speaking with the correct person using two identifiers.  . Location of the patient: Home . Location of the provider: Office Only the patient (+/- their family members at pt's discretion) and myself were participating in the encounter    - This visit type was conducted due to national recommendations for restrictions regarding the COVID-19 Pandemic (e.g. social distancing) in an effort to limit this patient's exposure and mitigate transmission in our community.  This format is felt to be most appropriate for this patient at this time.   - The patient did have access to video technology today yet, we had technical difficulties with this method, requiring transitioning to audio only.    - No physical exam could be performed with this format, beyond that communicated to [name] by the patient/ family members as noted.   - Additionally my office staff/ schedulers discussed with the patient that there may be a monetary charge related to this service, depending on patient's medical insurance.   The patient expressed understanding, and agreed to proceed.      History of Present Illness:  --> pt is on medicare due to disability.   Cold extremities- feels feet and hands cold. Not exercising now- was going 4+ days prior to covid. Was doing cardio  Sleeping well- h/o insomnia and OSA-  7-8 hrs per night.  Sleeping well no concerns.  Mood- doing well.  Tells me no increase in worry due to COVID.  Tells me that her life has not really changed much.  HTN: not checking it at home- no way to check, however patient denies any symptoms.  Continues her medicines.  COPD/emphysema: Patient states her breathing has been good.  Ever since we started her on the Allegra she feels it is been a  little bit better-   Using nasal saline spray as well.  Symptoms are stable  CHRONIC BACK PAIN- using mobic once daily, takes flexeril prn- no s-e.  Has "helped tremendously".  And she asked I please not take those medicines away.  History of coronary artery disease/Chol:  Repatha was recently started by [facility] recently - pt can't afford it.  Unable to start it.  Pt has been focusing on diet and exercise.   Last lipid panel as follows:  Lab Results  Component Value Date   CHOL 183 05/17/2018   HDL 47 05/17/2018   LDLCALC 103 (H) 05/17/2018   LDLDIRECT 143.0 11/05/2015   TRIG 167 (H) 05/17/2018   CHOLHDL 3.9 05/17/2018    Hepatic Function Latest Ref Rng & Units 02/06/2017 06/28/2016 11/05/2015  Total Protein 6.0 - 8.5 g/dL 6.8 7.5 7.6  Albumin 3.5 - 5.5 g/dL 4.5 4.5 4.6  AST 0 - 40 IU/L _0 ALT 0 - 32 IU/L 7 12(L) 6  Alk Phosphatase 39 - 117 IU/L 100 92 85  Total Bilirubin 0.0 - 1.2 mg/dL 0.3 0.7 0.2  Bilirubin, Direct 0.0 - 0.3 mg/dL - - 0.0        Impression and Recommendations:    1. COPD mixed type ([place])   2. OSA and COPD overlap syndrome (H[facility])   3. Current mild episode of major depressive disorder, unspecified whether recurrent ([place])   4. Anxiety state   5. Coronary artery disease  involving native coronary artery of native heart without angina pectoris   6. Pure hypercholesterolemia   7. Postoperative hypothyroidism   8. Chronic fatigue   9. Generalized OA   10. Spasm of muscle of lower back/   chronic back pain and arthritis of the back   11. S/P total hysterectomy and bilateral salpingo-oophorectomy    -COPD symptoms are stable.  Allergy season has not been flaring her breathing issues.  She continues to take the Allegra and I highly encouraged sinus rinses not just saline spray.  Again continue to try to avoid exposures as well.  -Her OSA and sleep has been good.  She is getting restful sleep and sleeping anywhere from 6 to 8 hours.  No problems or concerns.   Continue treatment  Mood: Her anxiety and depression has been good.  She remains on current medicines and denies need for change in dose etc.  Encouraged her dietary and lifestyle modification in addition to medications to help.  Hyperlipidemia and coronary artery disease: Patient is treated by cardiology.  Repatha was tried recently but was cost prohibitive for patient.  She has follow-up with them about this.  I reminded her of low saturated trans-fat diet and continue to avoid meat and try to increase her vegetables  Generalized osteoarthritis and chronic low back pain with spasms: Patient is taking her Mobic daily.  She also only takes Flexeril as needed.  These worked wonders for her.  She understands potential risks and desires to continue them as her quality of life is poor without them  Patient is status post total hysterectomy but needs yearly physical.  Last lung cancer screening was December 2018.  She supposed to get this yearly along with physical however patient was lost to follow-up.  I encouraged her to follow-up for complete physical with complete set of fasting blood work in 4 months.   - As part of my medical decision making, I reviewed the following data within the [place] History obtained from pt /family, CMA notes reviewed and incorporated if applicable, Labs reviewed, Radiograph/ tests reviewed if applicable and OV notes from prior OV's with me, as well as other specialists she/he has seen since seeing me last, were all reviewed and used in my medical decision making process today.   - Additionally, discussion had with patient regarding txmnt plan, their biases about that plan etc were used in my medical decision making today.   - The patient agreed with the plan and demonstrated an understanding of the instructions.   No barriers to understanding were identified.   - Red flag symptoms and signs discussed in detail.  Patient expressed understanding regarding  what to do in case of emergency\ urgent symptoms.  The patient was advised to call back or seek an in-person evaluation if the symptoms worsen or if the condition fails to improve as anticipated.   Return for 84mo 4 cpe w/ fbw - no pap.    No orders of the defined types were placed in this encounter.   No orders of the defined types were placed in this encounter.   Medications Discontinued During This Encounter  Medication Reason  . Evolocumab (REPATHA SURECLICK) 1277MG/ML SOAJ Cost of medication  . TIZANIDINE HCL PO Error  . Phenazopyridine HCl (AZO TABS PO) No longer needed (for PRN medications)      I provided 26 minutes of non-face-to-face time during this encounter,with over 50% of the time in direct counseling on patients medical  conditions/ medical concerns.  Additional time was spent with charting and coordination of care after the actual visit commenced.   Note:  This note was prepared with assistance of Dragon voice recognition software. Occasional wrong-word or sound-a-like substitutions may have occurred due to the inherent limitations of voice recognition software.  Mellody Dance, DO     Patient Care Team    Relationship Specialty Notifications Start End  Mellody Dance, DO PCP - General Family Medicine  07/05/16   Lelon Perla, MD Consulting Physician Cardiology  05/10/16   Deneise Lever, MD Consulting Physician Pulmonary Disease  05/10/16   Roseanne Kaufman, MD Consulting Physician Orthopedic Surgery  05/10/16   Garlan Fair, MD Consulting Physician Gastroenterology  05/10/16     -Vitals obtained; medications/ allergies reconciled;  personal medical, social, Sx etc.histories were updated by CMA, reviewed by me and are reflected in chart  Patient Active Problem List   Diagnosis Date Noted  . Adjustment disorder with mixed anxiety and depressed mood 08/02/2016    Priority: High  . Stented coronary artery 02/26/2015    Priority: High  . Coronary artery  disease 04/08/2013    Priority: High  . Hyperlipidemia- intol to statins 06/14/2007    Priority: High  . COPD mixed type ([place]) 06/14/2007    Priority: High  . Lung nodule: benign-appearing, screening CT each December 07/11/2017    Priority: Medium  . Insomnia secondary to anxiety 05/10/2016    Priority: Medium  . Obstructive sleep apnea 08/26/2015    Priority: Medium  . Centrilobular emphysema ([place]) 06/24/2014    Priority: Medium  . post-operative Hypothyroidism 10/06/2006    Priority: Medium  . S/P total hysterectomy and bilateral salpingo-oophorectomy 12/30/2016    Priority: Low  . Myalgia/ generalized fibromyalgia 08/02/2016    Priority: Low  . Vitamin D deficiency 10/07/2009    Priority: Low  . Chronic pain syndrome 02/20/2009    Priority: Low  . COMMON MIGRAINE 02/20/2009    Priority: Low  . Fatigue- chronic 07/15/2008    Priority: Low  . GERD 10/06/2006    Priority: Low  . OSA and COPD overlap syndrome ([place]) 07/19/2018  . Spasm of muscle of lower back 06/20/2018  . Dysuria  06/20/2018  . Drug intolerance- statin 03/21/2018  . Pure hypercholesterolemia 11/13/2017  . Generalized OA 11/13/2017  . Allergic Rhinosinusitis 11/13/2017  . Chronic cough due to COPD 11/13/2017  . Environmental and seasonal allergies-on Zyrtec, has reactive airway disease component seasonally 07/11/2017  . Senile solar keratosis 02/06/2017  . COPD with acute exacerbation ([place]) 01/11/2017  . ACS (acute coronary syndrome) 06/28/2016  . Crescendo angina ([place]) 06/28/2016  . Cough 04/28/2016  . Shingles rash 08/04/2015  . Allergic reaction 06/27/2015  . History of hemoptysis 06/27/2015  . Nocturnal hypoxemia 02/26/2015  . Hemoptysis 10/24/2014  . Soft tissue injury of multiple sites/bilateral hands 05/16/2013  . Bite from dog 05/15/2013  . Chest pain 05/15/2013  . COPD exacerbation ([place]) 12/31/2010  . Health care maintenance 12/25/2010  . MENOPAUSAL DISORDER 02/16/2010  . OSTEOARTHRITIS,  KNEE, RIGHT 02/20/2009  . BACK PAIN 11/27/2008  . Memory loss 07/15/2008  . PERIPHERAL EDEMA 07/15/2008  . KNEE PAIN, BILATERAL 09/05/2007  . Seasonal and perennial allergic rhinitis 06/14/2007  . Anxiety state 10/06/2006  . Depression 10/06/2006     Current Meds  Medication Sig  . acetaminophen (TYLENOL) 500 MG tablet Take 1,000 mg by mouth every 8 (eight) hours as needed (for pain or headahces).   Marland Kitchen  amitriptyline (ELAVIL) 25 MG tablet TAKE 2 TABLETS AT BEDTIME  . amLODipine (NORVASC) 2.5 MG tablet TAKE 1 TABLET (2.5 MG TOTAL) BY MOUTH DAILY.  Marland Kitchen aspirin EC 81 MG tablet Take 81 mg by mouth daily.  . B Complex Vitamins (VITAMIN B COMPLEX PO) Take 1 tablet by mouth daily.  Marland Kitchen buPROPion (WELLBUTRIN XL) 150 MG 24 hr tablet TAKE 1 TABLET EVERY DAY  . Cholecalciferol (VITAMIN D3) 5000 units CAPS Take 1 capsule by mouth daily.  . cyclobenzaprine (FLEXERIL) 10 MG tablet Take 1 tablet (10 mg total) by mouth 3 (three) times daily as needed for muscle spasms.  . DULoxetine HCl 40 MG CPEP TAKE 1 CAPSULE EVERY DAY  . fexofenadine (ALLEGRA) 180 MG tablet Take 1 tablet (180 mg total) by mouth daily.  . meloxicam (MOBIC) 15 MG tablet TAKE 1 TABLET (15 MG TOTAL) BY MOUTH DAILY.  . nitroGLYCERIN (NITROSTAT) 0.4 MG SL tablet Place 0.4 mg under the tongue every 5 (five) minutes as needed for chest pain.  Marland Kitchen omeprazole (PRILOSEC) 20 MG capsule Take 1 capsule (20 mg total) by mouth 2 (two) times daily before a meal.  . spironolactone (ALDACTONE) 25 MG tablet TAKE 1 TABLET EVERY DAY  . SYNTHROID 175 MCG tablet TAKE 1 TABLET BY MOUTH ONCE DAILY     Allergies  Allergen Reactions  . Bactrim [Sulfamethoxazole-Trimethoprim] Shortness Of Breath, Anxiety and Palpitations  . Ketorolac Shortness Of Breath  . Anoro Ellipta [Umeclidinium-Vilanterol]     Per patient, caused painful blisters in her mouth.   . Morphine Other (See Comments)    headache  . Tetracyclines & Related Itching  . Valsartan Other (See  Comments) and Cough    Headaches also   . Bystolic [Nebivolol Hcl] Other (See Comments)    Fatigue- Tired feeling  . Isosorbide Other (See Comments)    HEADACHES   . Levofloxacin Hives  . Lisinopril Cough  . Metoprolol Other (See Comments)    FATIGUE  . Morphine And Related Other (See Comments)    headache  . Simvastatin Other (See Comments)    Makes legs hurt/ sore   . Singulair [Montelukast Sodium] Palpitations  . Statins Other (See Comments)    MYALGIAS  . Tetracycline Hives and Itching  . Toradol [Ketorolac Tromethamine] Palpitations     ROS:  See above HPI for pertinent positives and negatives   Objective:   Height 5' 6.5" (1.689 m), weight 192 lb (87.1 kg).  (if some vitals are omitted, this means that patient was UNABLE to obtain them even though they were asked to get them prior to OV today.  They were asked to call us at their earliest convenience with these once obtained.)  General: A & O * 3; visually in no acute distress; in usual state of health.  Skin: Visible skin appears normal and pt's usual skin color HEENT:  EOMI, head is normocephalic and atraumatic.  Sclera are anicteric. Neck has a good range of motion.  Lips are noncyanotic Chest: normal chest excursion and movement Respiratory: speaking in full sentences, no conversational dyspnea; no use of accessory muscles Psych: insight good, mood- appears full

## 2018-07-20 ENCOUNTER — Ambulatory Visit: Payer: Medicare HMO | Admitting: Family Medicine

## 2018-08-03 DIAGNOSIS — Z0100 Encounter for examination of eyes and vision without abnormal findings: Secondary | ICD-10-CM | POA: Diagnosis not present

## 2018-08-15 ENCOUNTER — Other Ambulatory Visit: Payer: Self-pay | Admitting: *Deleted

## 2018-08-15 DIAGNOSIS — I712 Thoracic aortic aneurysm, without rupture, unspecified: Secondary | ICD-10-CM

## 2018-08-20 ENCOUNTER — Other Ambulatory Visit: Payer: Self-pay | Admitting: Family Medicine

## 2018-08-20 DIAGNOSIS — M159 Polyosteoarthritis, unspecified: Secondary | ICD-10-CM

## 2018-08-20 DIAGNOSIS — M791 Myalgia, unspecified site: Secondary | ICD-10-CM

## 2018-08-20 DIAGNOSIS — G894 Chronic pain syndrome: Secondary | ICD-10-CM

## 2018-08-20 DIAGNOSIS — F5104 Psychophysiologic insomnia: Secondary | ICD-10-CM

## 2018-08-20 DIAGNOSIS — F4323 Adjustment disorder with mixed anxiety and depressed mood: Secondary | ICD-10-CM

## 2018-08-20 DIAGNOSIS — R5382 Chronic fatigue, unspecified: Secondary | ICD-10-CM

## 2018-08-28 ENCOUNTER — Other Ambulatory Visit: Payer: Self-pay | Admitting: Cardiology

## 2018-08-30 ENCOUNTER — Telehealth: Payer: Self-pay | Admitting: Cardiology

## 2018-08-30 NOTE — Telephone Encounter (Signed)
Anderson Malta from [place] called in regards to this procedure.  The patient was authorized by [facility] to have the procedure done at [facility]. The patient is scheduled to have the procedure done at [place].   If the patient should have this done at [facility], please let Anderson Malta at [facility] imaging know so that they can cancel the patient's appointment.   The patient is scheduled to have this procedure done tomorrow, 08/31/18 at 8:30 am at the [place] location.  You can reach [facility] directly at [phone].

## 2018-08-30 NOTE — Telephone Encounter (Signed)
Spoke with [employer], the patient is schedule for CTA at [employer] and the [place] authorization is for [place] and needs to be changed. Message sent to pre-cert regarding this.

## 2018-08-30 NOTE — Telephone Encounter (Signed)
° °   [facility] Pre-operative Risk Assessment    Request for surgical clearance:  1. What type of surgery is being performed? CTA of Chest   2. When is this surgery scheduled? 08/31/18 8:30 am  3. What type of clearance is required (medical clearance vs. Pharmacy clearance to hold med vs. Both)? none  4. Are there any medications that need to be held prior to surgery and how long?none  5. Practice name and name of physician performing surgery? [facility]  6. What is your office phone number [phone]   7.   What is your office fax number [phone]  8.   Anesthesia type (None, local, MAC, general) ? none   Amy Hodge 08/30/2018, 9:04 AM  _________________________________________________________________   (provider comments below)

## 2018-08-31 ENCOUNTER — Ambulatory Visit
Admission: RE | Admit: 2018-08-31 | Discharge: 2018-08-31 | Disposition: A | Discharge status: Home / Self Care | Payer: Medicare HMO | Source: Ambulatory Visit | Attending: Cardiology | Admitting: Cardiology

## 2018-08-31 DIAGNOSIS — I712 Thoracic aortic aneurysm, without rupture, unspecified: Secondary | ICD-10-CM

## 2018-08-31 MED ORDER — IOPAMIDOL (ISOVUE-370) INJECTION 76%
75.0000 mL | Freq: Once | INTRAVENOUS | Status: AC | PRN
  Administered 2018-08-31: 75 mL via INTRAVENOUS

## 2018-09-26 ENCOUNTER — Other Ambulatory Visit: Payer: Self-pay | Admitting: Family Medicine

## 2018-09-26 DIAGNOSIS — E890 Postprocedural hypothyroidism: Secondary | ICD-10-CM

## 2018-10-08 ENCOUNTER — Telehealth: Payer: Self-pay | Admitting: Family Medicine

## 2018-10-08 ENCOUNTER — Other Ambulatory Visit: Payer: Self-pay

## 2018-10-08 DIAGNOSIS — M62830 Muscle spasm of back: Secondary | ICD-10-CM

## 2018-10-08 NOTE — Telephone Encounter (Signed)
Patient called and is requesting a refill on Flexeril.  Medication was written 06/20/2018 for #30 no refills.  LOV 07/19/2018.  Please review and advise. MPulliam, CMA/RT(R)

## 2018-10-08 NOTE — Telephone Encounter (Signed)
Sent to Dr. Raliegh Scarlet to review. MPulliam, CMA/RT(R)

## 2018-10-08 NOTE — Telephone Encounter (Signed)
Patient called requesting a refill of her Flexeril for her back pain that she has come and go since being with [name], last prescribed in April 2020. If approved please send to [facility] in [place] please

## 2018-10-09 ENCOUNTER — Other Ambulatory Visit: Payer: Self-pay | Admitting: Family Medicine

## 2018-10-09 DIAGNOSIS — M62830 Muscle spasm of back: Secondary | ICD-10-CM

## 2018-10-09 MED ORDER — CYCLOBENZAPRINE HCL 10 MG PO TABS: 10.0000 mg | ORAL_TABLET | Freq: Three times a day (TID) | ORAL | 0 refills | Status: DC | PRN

## 2018-10-10 ENCOUNTER — Other Ambulatory Visit: Payer: Self-pay

## 2018-10-10 DIAGNOSIS — M62830 Muscle spasm of back: Secondary | ICD-10-CM

## 2018-10-10 MED ORDER — CYCLOBENZAPRINE HCL 10 MG PO TABS: 10.0000 mg | ORAL_TABLET | Freq: Three times a day (TID) | ORAL | 0 refills | Status: DC | PRN

## 2018-10-12 ENCOUNTER — Other Ambulatory Visit: Payer: Self-pay | Admitting: Cardiology

## 2018-10-22 DIAGNOSIS — R0602 Shortness of breath: Secondary | ICD-10-CM | POA: Diagnosis not present

## 2018-10-22 DIAGNOSIS — R070 Pain in throat: Secondary | ICD-10-CM | POA: Diagnosis not present

## 2018-10-22 DIAGNOSIS — Z20828 Contact with and (suspected) exposure to other viral communicable diseases: Secondary | ICD-10-CM | POA: Diagnosis not present

## 2018-10-22 DIAGNOSIS — R509 Fever, unspecified: Secondary | ICD-10-CM | POA: Diagnosis not present

## 2018-10-22 DIAGNOSIS — R51 Headache: Secondary | ICD-10-CM | POA: Diagnosis not present

## 2018-10-22 DIAGNOSIS — R05 Cough: Secondary | ICD-10-CM | POA: Diagnosis not present

## 2018-10-24 ENCOUNTER — Telehealth: Payer: Self-pay | Admitting: Internal Medicine

## 2018-10-24 NOTE — Telephone Encounter (Signed)
Called and spoke with patient. Pt states she needs and appt w/ CY as it has been awhile since her last appt. Pt states she is having a chronic, productive cough that she would like to be assessed for. She further notes she's been recently tested negative for the novel coronavirus on Monday 10/22/2018. I let her know CY has a spot open for tomorrow 10/25/2018 at 3:30 PM EDT, which she agreed to scheduling. Pt's coronavirus screen was negative except for  3. Have you been taking Tylenol or any fever reducer for fever or body aches? Pt stated she has had a fever in the past few days of 100*F; however, has not been running one today. Pt is not having any of the cardinal symptoms. I spoke w/ CY about this and he said he would still be ok with seeing her face-to-face or televisit based on what she is comfortable with. Pt states she would prefer to be seen so CY can listen to her lungs. I let her know we may need to change the visit into a televisit based on if she is running a fever tomorrow. Pt expressed understanding with no additional questions.   Appt has been scheduled. Routing to CY as an [place]. Nothing further needed at this time.

## 2018-10-25 ENCOUNTER — Encounter: Payer: Self-pay | Admitting: Internal Medicine

## 2018-10-25 ENCOUNTER — Ambulatory Visit (INDEPENDENT_AMBULATORY_CARE_PROVIDER_SITE_OTHER): Payer: Medicare HMO

## 2018-10-25 ENCOUNTER — Other Ambulatory Visit: Payer: Self-pay

## 2018-10-25 ENCOUNTER — Ambulatory Visit: Payer: Medicare HMO | Admitting: Adult Health

## 2018-10-25 ENCOUNTER — Ambulatory Visit: Payer: Medicare HMO | Admitting: Internal Medicine

## 2018-10-25 VITALS — BP 130/90 | HR 97 | Temp 97.4°F | Ht 66.5 in | Wt 192.6 lb

## 2018-10-25 DIAGNOSIS — R05 Cough: Secondary | ICD-10-CM

## 2018-10-25 DIAGNOSIS — J3089 Other allergic rhinitis: Secondary | ICD-10-CM | POA: Diagnosis not present

## 2018-10-25 DIAGNOSIS — I719 Aortic aneurysm of unspecified site, without rupture: Secondary | ICD-10-CM | POA: Diagnosis not present

## 2018-10-25 DIAGNOSIS — I700 Atherosclerosis of aorta: Secondary | ICD-10-CM | POA: Diagnosis not present

## 2018-10-25 DIAGNOSIS — R059 Cough, unspecified: Secondary | ICD-10-CM

## 2018-10-25 DIAGNOSIS — J441 Chronic obstructive pulmonary disease with (acute) exacerbation: Secondary | ICD-10-CM | POA: Diagnosis not present

## 2018-10-25 MED ORDER — HYDROCOD POLST-CPM POLST ER 10-8 MG/5ML PO SUER: 5.0000 mL | Freq: Two times a day (BID) | ORAL | 0 refills | Status: DC | PRN

## 2018-10-25 NOTE — Progress Notes (Signed)
HPI female former smoker followed for COPD mixed type, hemoptysis, complicated by CAD/ MI N5AO WNL MM 138 6MWT-06/23/2014-94%, 93%, 99%, 452 m on room air. No oxygen limitation. PFT-06/23/14- moderate obstructive airways disease with insignificant response to bronchodilator, normal lung capacity, diffusion mildly reduced. FVC 2.64/70%, FEV1 1.92/65%, FEV1/FVC 0.72 ONOX-04/30/14- 314 minutes with oxygen saturation less than or equal to 88% on room air, qualifying for home O2 during sleep ECHO 05/16/13 EF 60-65%, GR1 diast dysfunction HST- 07/16/15- AHI 6/ hr, desaturation to 82%, body weight 186 lbs --------------------------------------------------------------------------------------------------- 01/01/2018- 63 year old female former smoker followed for COPD mixed type, hemoptysis, complicated by CAD/ MI, RUL nodule -----4 wk f/u for COPD. Patient states she can not take Anoro due to her forming blisters in her mouth.  Albuterol HFA,  Ventolin HFA causes jitters-only tolerates 1 puff.  Recognizes little wheezing.  Frequent cough, occasionally greenish-yellow without fever or blood. Going to [employer] exercise program.  More aware of dyspnea on exertion. Notices increased cough lying down but finds omeprazole prevents this.  10/25/2018- 63 year old female former smoker followed for COPD mixed type, hemoptysis, complicated by CAD/ MI, RUL nodule -----pt reports dry productive cough for 1 week; recently tested negative for COVID-19 WTW till Sept for flu vax Describes "tired" x 3 weeks, fever 100 deg and transient diarrhea 1 week ago, cough change, increased, dry or scant foamy. Persistent DOE with her COPD. Covid swab at [facility] 4 days ago, reported neg yesterday. Uses her husband's albuterol hfa occ ("free") Otherwise doesn't feel sick. Daily allegra, but doesn't expect much seasonal allergy now. Not wheezing. Perles don't help cough. CTaChest Aorta 08/31/18- IMPRESSION: 1. Stable mild  fusiform aneurysmal dilatation of the ascending thoracic aorta with a maximal transverse diameter of 4.0 cm. Recommend annual imaging followup by CTA or MRA. This recommendation follows 2010 ACCF/AHA/AATS/ACR/ASA/SCA/SCAI/SIR/STS/SVM Guidelines for the Diagnosis and Management of Patients with Thoracic Aortic Disease. Circulation. 2010; 121: Z308-M578. Aortic aneurysm NOS (ICD10-I71.9) 2. Stable mild enlargement of the main pulmonary artery as can be seen in the setting of pulmonary arterial hypertension. 3. Moderate centrilobular pulmonary emphysema. Stable appearance of the liver which is diffusely heterogeneous on arterial phase imaging. Similar findings have been noted dating back to 2017. 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 Aortic Atherosclerosis (ICD10-I70.0) and Emphysema (ICD10-J43.9).  ROS-see HPI      + = positive Constitutional:   No-   weight loss, night sweats, fevers, chills, fatigue, lassitude. HEENT:   + headaches, difficulty swallowing, tooth/dental problems, sore throat,       No-  sneezing, itching, ear ache, +nasal congestion, post nasal drip,  CV:  +chest pain, orthopnea, PND, swelling in lower extremities, anasarca,                                                          dizziness, +palpitations Resp: +shortness of breath with exertion or at rest.                productive cough,  + non-productive cough,   coughing up of blood.               change in color of mucus.  wheezing.   Skin: No-   rash or lesions. GI:  + heartburn, indigestion, abdominal  pain, nausea, vomiting,  GU:  MS:  +  joint pain or swelling.   Neuro-     nothing unusual Psych:  No- change in mood or affect. No depression or anxiety.  No memory loss.  OBJ- Physical Exam General- Alert, Oriented, Affect-appropriate, Distress- none acute Skin- rash-none, lesions- none, excoriation-  none Lymphadenopathy- none Head- atraumatic            Eyes- Gross vision intact, PERRLA, conjunctivae and secretions clear            Ears- Hearing, canals-normal            Nose- Clear, no-Septal dev, mucus, polyps, erosion, perforation             Throat- Mallampati II , mucosa clear , drainage- none, tonsils- atrophic,                    + dentures Neck- flexible , trachea midline, no stridor , thyroid nl, carotid no bruit Chest - symmetrical excursion , unlabored           Heart/CV- RRR , no murmur , no gallop  , no rub, nl s1 s2                           - JVD- none , edema- none, stasis changes- none, varices- none           Lung- clear to P&A, wheeze- none, cough+frequent , dullness-none, rub- none           Chest wall-  Abd-  Br/ Gen/ Rectal- Not done, not indicated Extrem- cyanosis- none, clubbing, none, atrophy- none, strength- nl Neuro- grossly intact to observation

## 2018-10-25 NOTE — Assessment & Plan Note (Signed)
Not obviously a typical COPD exacerbation. Possibly mild viral syndrome, non-specific. Main concern is to be sure not Covid. Plan- hydration, Tussionex, repeat Covid test, CXR. Covid precautions. ER if worse. We will re-think inhaler management as needed.

## 2018-10-25 NOTE — Patient Instructions (Signed)
Script sent for Tussionex cough syrup  Stay well hydrated  Order- CXR    Dx change of cough  Order- Covid test swab  Don't forget that flu shot this fall  If you get worse with more shortness of breath, go to the ER.

## 2018-10-25 NOTE — Assessment & Plan Note (Signed)
>>  ASSESSMENT AND PLAN FOR ENVIRONMENTAL AND SEASONAL ALLERGIES-ON ZYRTEC, HAS REACTIVE AIRWAY DISEASE COMPONENT SEASONALLY WRITTEN ON 10/25/2018  3:51 PM BY YOUNG, CLINTON D, MD  Not noted on CTa chest aorta  08/31/18, which will be repeated following aortic aneurysm

## 2018-10-25 NOTE — Assessment & Plan Note (Signed)
Not noted on CTa chest aorta  08/31/18, which will be repeated following aortic aneurysm

## 2018-10-26 ENCOUNTER — Other Ambulatory Visit: Payer: Self-pay

## 2018-10-26 DIAGNOSIS — R6889 Other general symptoms and signs: Secondary | ICD-10-CM | POA: Diagnosis not present

## 2018-10-26 DIAGNOSIS — Z20822 Contact with and (suspected) exposure to covid-19: Secondary | ICD-10-CM

## 2018-10-27 LAB — NOVEL CORONAVIRUS, NAA: SARS-CoV-2, NAA: NOT DETECTED

## 2018-11-29 ENCOUNTER — Other Ambulatory Visit: Payer: Self-pay | Admitting: Family Medicine

## 2018-11-29 ENCOUNTER — Other Ambulatory Visit: Payer: Self-pay | Admitting: Cardiology

## 2018-11-29 DIAGNOSIS — F4323 Adjustment disorder with mixed anxiety and depressed mood: Secondary | ICD-10-CM

## 2018-12-21 ENCOUNTER — Other Ambulatory Visit: Payer: Self-pay

## 2018-12-21 ENCOUNTER — Ambulatory Visit (INDEPENDENT_AMBULATORY_CARE_PROVIDER_SITE_OTHER): Payer: Medicare HMO

## 2018-12-21 DIAGNOSIS — Z23 Encounter for immunization: Secondary | ICD-10-CM

## 2018-12-21 NOTE — Progress Notes (Signed)
Pt here for influenza vaccine.  Screening questionnaire reviewed, VIS provided to patient, and any/all patient questions answered.  T. Yarden Hillis, CMA  

## 2018-12-28 ENCOUNTER — Other Ambulatory Visit: Payer: Self-pay | Admitting: Family Medicine

## 2018-12-28 DIAGNOSIS — E890 Postprocedural hypothyroidism: Secondary | ICD-10-CM

## 2019-01-25 ENCOUNTER — Ambulatory Visit (INDEPENDENT_AMBULATORY_CARE_PROVIDER_SITE_OTHER): Payer: Medicare HMO | Admitting: Family Medicine

## 2019-01-25 ENCOUNTER — Encounter: Payer: Self-pay | Admitting: Family Medicine

## 2019-01-25 ENCOUNTER — Other Ambulatory Visit: Payer: Self-pay

## 2019-01-25 VITALS — BP 128/88 | HR 78 | Temp 98.1°F | Resp 10 | Ht 66.5 in | Wt 189.6 lb

## 2019-01-25 DIAGNOSIS — E890 Postprocedural hypothyroidism: Secondary | ICD-10-CM | POA: Diagnosis not present

## 2019-01-25 DIAGNOSIS — E7800 Pure hypercholesterolemia, unspecified: Secondary | ICD-10-CM | POA: Diagnosis not present

## 2019-01-25 DIAGNOSIS — J449 Chronic obstructive pulmonary disease, unspecified: Secondary | ICD-10-CM | POA: Diagnosis not present

## 2019-01-25 DIAGNOSIS — M25552 Pain in left hip: Secondary | ICD-10-CM

## 2019-01-25 DIAGNOSIS — E785 Hyperlipidemia, unspecified: Secondary | ICD-10-CM

## 2019-01-25 DIAGNOSIS — Z114 Encounter for screening for human immunodeficiency virus [HIV]: Secondary | ICD-10-CM | POA: Diagnosis not present

## 2019-01-25 DIAGNOSIS — Z0000 Encounter for general adult medical examination without abnormal findings: Secondary | ICD-10-CM

## 2019-01-25 DIAGNOSIS — E559 Vitamin D deficiency, unspecified: Secondary | ICD-10-CM

## 2019-01-25 DIAGNOSIS — Z1231 Encounter for screening mammogram for malignant neoplasm of breast: Secondary | ICD-10-CM | POA: Diagnosis not present

## 2019-01-25 DIAGNOSIS — Z23 Encounter for immunization: Secondary | ICD-10-CM

## 2019-01-25 DIAGNOSIS — R911 Solitary pulmonary nodule: Secondary | ICD-10-CM | POA: Diagnosis not present

## 2019-01-25 DIAGNOSIS — E039 Hypothyroidism, unspecified: Secondary | ICD-10-CM | POA: Diagnosis not present

## 2019-01-25 DIAGNOSIS — I2510 Atherosclerotic heart disease of native coronary artery without angina pectoris: Secondary | ICD-10-CM | POA: Diagnosis not present

## 2019-01-25 DIAGNOSIS — M25551 Pain in right hip: Secondary | ICD-10-CM | POA: Insufficient documentation

## 2019-01-25 MED ORDER — SHINGRIX 50 MCG/0.5ML IM SUSR: 0.5000 mL | Freq: Once | INTRAMUSCULAR | 0 refills | Status: AC

## 2019-01-25 NOTE — Progress Notes (Signed)
Subjective:   Amy Hodge is a 63 y.o. female who presents for Medicare Annual (Subsequent) preventive examination.  HPI: Confirms overall feeling well. When asked if they are staying at home and trying to stay safe during COVID-19, she states "We're not hibernating so to speak, but we're careful." Denies falling. Says sometimes her hips hurt her so badly, "I can hardly stand around on them." She has sharp, burning pains in both sides of her hips when she lies down on her hips at night. Patient does not currently follow up with Orthopedics or Sports Medicine. Quit smoking about 24 years ago. Follows up with [facility] in [place], near [place]. When asked whether she goes to the dentist, patient states she has false teeth in place. Usually obtains mammograms yearly, "I just haven't had it this year." Thinks she usually obtains these at the [place]. Had a complete hysterectomy.     Objective:    Vitals: BP 128/88 (BP Location: Left Arm, Patient Position: Sitting, Cuff Size: Large)   Pulse 78   Temp 98.1 F (36.7 C) (Oral)   Resp 10   Ht 5' 6.5" (1.689 m)   Wt 189 lb 9.6 oz (86 kg)   SpO2 95%   BMI 30.14 kg/m   Body mass index is 30.14 kg/m.  Advanced Directives 08/30/2016 08/02/2016 06/28/2016 05/10/2016 12/15/2015 02/24/2015 03/04/2014  Does Patient Have a Medical Advance Directive? No No No No No No No  Would patient like information on creating a medical advance directive? No - Patient declined - - Yes (MAU/Ambulatory/Procedural Areas - Information given) - No - patient declined information No - patient declined information    Tobacco Social History   Tobacco Use  Smoking Status Former Smoker  . Packs/day: 2.00  . Years: 25.00  . Pack years: 50.00  . Types: Cigarettes  . Quit date: 04/23/1996  . Years since quitting: 22.7  Smokeless Tobacco Never Used     Counseling given: Not Answered   Clinical Intake:                       Past Medical  History:  Diagnosis Date  . Allergy   . Anxiety   . COPD (chronic obstructive pulmonary disease) ([place])   . Coronary artery disease    a. s/p PCI of RCA in 2015 b. cath in 12/2015 showed 100% Ost RPDA with collaterals present  . DDD (degenerative disc disease), cervical   . Depression   . DJD (degenerative joint disease) of knee    RT  . GERD (gastroesophageal reflux disease)   . Hashimoto's disease   . Headache(784.0)   . Hyperlipidemia   . Hypertension   . Hypothyroidism   . Myocardial infarction ([place])   . Nocturnal hypoxemia 02/26/2015  . On supplemental oxygen therapy    Oxygen 2.5 l/m at bedtime.  . Stented coronary artery 02/26/2015   Past Surgical History:  Procedure Laterality Date  . ABDOMINAL HYSTERECTOMY    . APPENDECTOMY  1971  . BUNIONECTOMY Right   . CARDIAC CATHETERIZATION N/A 09/02/2014   Procedure: Left Heart Cath and Coronary Angiography;  Surgeon: Adrian Prows, MD;  Location: [facility] LAB;  Service: Cardiovascular;  Laterality: N/A;  . CARDIAC CATHETERIZATION N/A 12/15/2015   Procedure: Left Heart Cath and Coronary Angiography;  Surgeon: Jettie Booze, MD;  Location: [facility] CV LAB;  Service: Cardiovascular;  Laterality: N/A;  . CERVICAL FUSION    . CESAREAN  SECTION    . COLONOSCOPY WITH PROPOFOL N/A 02/24/2015   Procedure: COLONOSCOPY WITH PROPOFOL;  Surgeon: Garlan Fair, MD;  Location: [facility] ENDOS[facility];  Service: Endoscopy;  Laterality: N/A;  . ESOPHAGOGASTRODUODENOSCOPY (EGD) WITH PROPOFOL N/A 02/24/2015   Procedure: ESOPHAGOGASTRODUODENOSCOPY (EGD) WITH PROPOFOL;  Surgeon: Garlan Fair, MD;  Location: [facility];  Service: Endoscopy;  Laterality: N/A;  . HAND SURGERY     due to trauma-dog bite.  . I&D EXTREMITY Bilateral 05/17/2013   Procedure: IRRIGATION AND DEBRIDEMENT EXTREMITY;  Surgeon: Roseanne Kaufman, MD;  Location: [facility];  Service: Orthopedics;  Laterality: Bilateral;  . LEFT HEART CATHETERIZATION WITH CORONARY ANGIOGRAM N/A  03/04/2014   Procedure: LEFT HEART CATHETERIZATION WITH CORONARY ANGIOGRAM;  Surgeon: Laverda Page, MD;  Location: [facility] CATH LAB;  Service: Cardiovascular;  Laterality: N/A;  . OOPHORECTOMY    . SHOULDER SURGERY     RT  . THYROIDECTOMY  1996  . TONSILLECTOMY  1965  . TOTAL ABDOMINAL HYSTERECTOMY W/ BILATERAL SALPINGOOPHORECTOMY  01/2008   with cervical dysplasia  . TUBAL LIGATION  1984   Family History  Problem Relation Age of Onset  . Lung cancer Mother   . Hypertension Mother   . Stroke Mother   . Diabetes Mother   . Diabetes Father   . Hypertension Father   . Heart attack Father   . Asthma Father   . COPD Father   . Alcohol abuse Father   . Diabetes Sister   . Hypertension Sister    Social History   Socioeconomic History  . Marital status: Married    Spouse name: Not on file  . Number of children: 1  . Years of education: Not on file  . Highest education level: Not on file  Occupational History  . Occupation: disabled    Employer: [employer]  Social Needs  . Financial resource strain: Not on file  . Food insecurity    Worry: Not on file    Inability: Not on file  . Transportation needs    Medical: Not on file    Non-medical: Not on file  Tobacco Use  . Smoking status: Former Smoker    Packs/day: 2.00    Years: 25.00    Pack years: 50.00    Types: Cigarettes    Quit date: 04/23/1996    Years since quitting: 22.7  . Smokeless tobacco: Never Used  Substance and Sexual Activity  . Alcohol use: No    Alcohol/week: 0.0 standard drinks  . Drug use: No  . Sexual activity: Not Currently  Lifestyle  . Physical activity    Days per week: Not on file    Minutes per session: Not on file  . Stress: Not on file  Relationships  . Social Herbalist on phone: Not on file    Gets together: Not on file    Attends religious service: Not on file    Active member of club or organization: Not on file    Attends meetings of clubs or organizations: Not on  file    Relationship status: Not on file  Other Topics Concern  . Not on file  Social History Narrative   On worker's comp for lower back and right shoulder - applying for disability; losing [employer] insurance 03/07/2010    Outpatient Encounter Medications as of 01/25/2019  Medication Sig  . acetaminophen (TYLENOL) 500 MG tablet Take 1,000 mg by mouth every 8 (eight) hours as needed (for pain or headahces).   Marland Kitchen  amitriptyline (ELAVIL) 25 MG tablet TAKE 2 TABLETS AT BEDTIME  . amLODipine (NORVASC) 2.5 MG tablet TAKE 1 TABLET (2.5 MG TOTAL) DAILY.  Marland Kitchen aspirin EC 81 MG tablet Take 81 mg by mouth daily.  . B Complex Vitamins (VITAMIN B COMPLEX PO) Take 1 tablet by mouth daily.  Marland Kitchen buPROPion (WELLBUTRIN XL) 150 MG 24 hr tablet TAKE 1 TABLET EVERY DAY  . chlorpheniramine-HYDROcodone (TUSSIONEX PENNKINETIC ER) 10-8 MG/5ML SUER Take 5 mLs by mouth every 12 (twelve) hours as needed for cough.  . Cholecalciferol (VITAMIN D3) 5000 units CAPS Take 1 capsule by mouth daily.  . cyclobenzaprine (FLEXERIL) 10 MG tablet Take 1 tablet (10 mg total) by mouth 3 (three) times daily as needed for muscle spasms.  . DULoxetine HCl 40 MG CPEP TAKE 1 CAPSULE EVERY DAY  . fexofenadine (ALLEGRA) 180 MG tablet Take 1 tablet (180 mg total) by mouth daily.  . meloxicam (MOBIC) 15 MG tablet TAKE 1 TABLET EVERY DAY  . nitroGLYCERIN (NITROSTAT) 0.4 MG SL tablet Place 0.4 mg under the tongue every 5 (five) minutes as needed for chest pain.  Marland Kitchen omeprazole (PRILOSEC) 20 MG capsule TAKE 1 CAPSULE TWICE DAILY BEFORE MEALS  . spironolactone (ALDACTONE) 25 MG tablet TAKE 1 TABLET EVERY DAY  . SYNTHROID 175 MCG tablet Take 1 tablet by mouth once daily   No facility-administered encounter medications on file as of 01/25/2019.     Activities of Daily Living In your present state of health, do you have any difficulty performing the following activities: 01/25/2019  Hearing? N  Vision? N  Difficulty concentrating or making decisions?  N  Walking or climbing stairs? N  Dressing or bathing? N  Doing errands, shopping? N  Some recent data might be hidden    Patient Care Team: Mellody Dance, DO as PCP - General (Family Medicine) Stanford Breed, Denice Bors, MD as Consulting Physician (Cardiology) Deneise Lever, MD as Consulting Physician (Pulmonary Disease) Roseanne Kaufman, MD as Consulting Physician (Orthopedic Surgery) Garlan Fair, MD as Consulting Physician (Gastroenterology)    Assessment:   This is a routine wellness examination for [place].  Exercise Activities and Dietary recommendations    Goals   None     Fall Risk Fall Risk  01/25/2019 03/21/2018 11/13/2017 08/22/2017 03/30/2017  Falls in the past year? 0 0 No No No  Number falls in past yr: 0 - - - -  Injury with Fall? 0 - - - -  Follow up Falls evaluation completed Falls evaluation completed - - -   Is the patient's home free of loose throw rugs in walkways, pet beds, electrical cords, etc?   yes      Grab bars in the bathroom? no      Handrails on the stairs?   yes      Adequate lighting?   yes  Timed Get Up and Go performed: n/a  Depression Screen PHQ 2/9 Scores 01/25/2019 07/19/2018 06/20/2018 03/21/2018  PHQ - 2 Score 0 0 0 0  PHQ- 9 Score 0 0 0 0     Cognitive Function 6CIT Screen 01/25/2019  What Year? 0 points  What month? 0 points  What time? 0 points  Count back from 20 0 points  Months in reverse 0 points  Repeat phrase 0 points  Total Score 0    Immunization History  Administered Date(s) Administered  . Influenza Split 12/31/2010  . Influenza Whole 11/27/2008, 02/16/2010  . Influenza,inj,Quad PF,6+ Mos 12/30/2016, 12/26/2017, 12/21/2018  . Influenza-Unspecified 05/15/2013,  12/06/2014, 11/05/2015, 12/26/2017  . Pneumococcal Conjugate-13 02/15/2013  . Pneumococcal Polysaccharide-23 03/07/2012, 01/01/2018  . Td 03/07/2004  . Tdap 02/26/2015    Qualifies for Shingles Vaccine? patient declined shingles Screening  Tests Health Maintenance  Topic Date Due  . HIV Screening  08/30/2028 (Originally 10/26/1970)  . MAMMOGRAM  03/22/2019  . COLONOSCOPY  02/24/2020  . TETANUS/TDAP  02/25/2025  . INFLUENZA VACCINE  Completed  . Hepatitis C Screening  Completed  . PAP SMEAR-Modifier  Discontinued    Cancer Screenings: Lung: Low Dose CT Chest recommended if Age 84-80 years, 30 pack-year currently smoking OR have quit w/in 15years. Patient does qualify. Breast:  Up to date on Mammogram? Yes   Up to date of Bone Density/Dexa? Yes Colorectal: 02/24/2015   Additional Screenings: Hepatitis C Screening: done on 03/03/2015 HIV Screening ordered today     Plan:   PLAN: - Reviewed need for screenings and immunizations as recommended. - Conversation held. Education provided and all questions answered. - Labs obtained this morning. See orders. - Last mammogram obtained January 2019. - Need for mammogram near future. - No need for pap smear; patient status post hysterectomy. - Last colonoscopy obtained in 2016 with 10 year follow-up. - TDAP up to date. - Pneumonia vaccine up to date. - Need for shingles vaccine. - Discussed Medicare requirement to obtain vaccine at pharmacy. - Reviewed patient's history of smoking; per patient, quit 24 years ago. - Due to patient history of lung nodule, low-dose CT chest recommended every 12 months. - Dr. Stanford Breed ordered CT of chest in June; repeat CT lung cancer screen June of 2021. - Low risk Hep C/HIV screen reviewed with patient today. - Influenza vaccination obtained. - Last DEXA scan obtained January 2019; due March 22 2019. - Advised patient to call and schedule both DEXA and mammogram at screening site. - Discussed referral to Sports Medicine for patient's bilateral hip pain. - Ambulatory referral provided today. - Return to discuss lab results PRN.    Orders Placed This Encounter  Procedures  . MM Digital Diagnostic Bilat  . CBC w/Diff  . CMP (comprehensive metabolic  panel)  . TSH  . T4, free  . Lipid panel  . VITAMIN D 25 Hydroxy (Vit-D Deficiency, Fractures)  . Hemoglobin A1c  . HIV antibody (with reflex)  . Ambulatory referral to Sports Medicine   I have personally reviewed and noted the following in the patient's chart:   . Medical and social history . Use of alcohol, tobacco or illicit drugs  . Current medications and supplements . Functional ability and status . Nutritional status . Physical activity . Advanced directives . List of other physicians . Hospitalizations, surgeries, and ER visits in previous 12 months . Vitals . Screenings to include cognitive, depression, and falls . Referrals and appointments  In addition, I have reviewed and discussed with patient certain preventive protocols, quality metrics, and best practice recommendations. A written personalized care plan for preventive services as well as general preventive health recommendations were provided to patient.   This document serves as a record of services personally performed by Mellody Dance, DO. It was created on her behalf by Shivangi Amend, a trained medical scribe. The creation of this record is based on the scribe's personal observations of the office visit and the provider's statements to them.   This case required medical decision making of at least moderate complexity. The above documentation has been reviewed to be accurate and was completed by Marjory Sneddon, D.O.

## 2019-01-26 LAB — CBC WITH DIFFERENTIAL/PLATELET
Basophils Absolute: 0.1 10*3/uL (ref 0.0–0.2)
Basos: 1 %
EOS (ABSOLUTE): 0.2 10*3/uL (ref 0.0–0.4)
Eos: 3 %
Hematocrit: 38.6 % (ref 34.0–46.6)
Hemoglobin: 13.3 g/dL (ref 11.1–15.9)
Immature Grans (Abs): 0 10*3/uL (ref 0.0–0.1)
Immature Granulocytes: 0 %
Lymphocytes Absolute: 2.2 10*3/uL (ref 0.7–3.1)
Lymphs: 32 %
MCH: 28.7 pg (ref 26.6–33.0)
MCHC: 34.5 g/dL (ref 31.5–35.7)
MCV: 83 fL (ref 79–97)
Monocytes Absolute: 0.4 10*3/uL (ref 0.1–0.9)
Monocytes: 6 %
Neutrophils Absolute: 4 10*3/uL (ref 1.4–7.0)
Neutrophils: 58 %
Platelets: 273 10*3/uL (ref 150–450)
RBC: 4.64 x10E6/uL (ref 3.77–5.28)
RDW: 12.8 % (ref 11.7–15.4)
WBC: 7 10*3/uL (ref 3.4–10.8)

## 2019-01-26 LAB — TSH: TSH: 0.858 u[IU]/mL (ref 0.450–4.500)

## 2019-01-26 LAB — VITAMIN D 25 HYDROXY (VIT D DEFICIENCY, FRACTURES): Vit D, 25-Hydroxy: 76.7 ng/mL (ref 30.0–100.0)

## 2019-01-26 LAB — T4, FREE: Free T4: 1.58 ng/dL (ref 0.82–1.77)

## 2019-01-26 LAB — COMPREHENSIVE METABOLIC PANEL
ALT: 9 IU/L (ref 0–32)
AST: 18 IU/L (ref 0–40)
Albumin/Globulin Ratio: 2.3 — ABNORMAL HIGH (ref 1.2–2.2)
Albumin: 4.5 g/dL (ref 3.8–4.8)
Alkaline Phosphatase: 94 IU/L (ref 39–117)
BUN/Creatinine Ratio: 22 (ref 12–28)
BUN: 18 mg/dL (ref 8–27)
Bilirubin Total: 0.3 mg/dL (ref 0.0–1.2)
CO2: 23 mmol/L (ref 20–29)
Calcium: 9.1 mg/dL (ref 8.7–10.3)
Chloride: 103 mmol/L (ref 96–106)
Creatinine, Ser: 0.83 mg/dL (ref 0.57–1.00)
GFR calc Af Amer: 88 mL/min/{1.73_m2} (ref >59)
GFR calc non Af Amer: 76 mL/min/{1.73_m2} (ref >59)
Globulin, Total: 2 g/dL (ref 1.5–4.5)
Glucose: 85 mg/dL (ref 65–99)
Potassium: 4.6 mmol/L (ref 3.5–5.2)
Sodium: 139 mmol/L (ref 134–144)
Total Protein: 6.5 g/dL (ref 6.0–8.5)

## 2019-01-26 LAB — HEMOGLOBIN A1C
Est. average glucose Bld gHb Est-mCnc: 108 mg/dL
Hgb A1c MFr Bld: 5.4 % (ref 4.8–5.6)

## 2019-01-26 LAB — LIPID PANEL
Chol/HDL Ratio: 3.5 ratio (ref 0.0–4.4)
Cholesterol, Total: 185 mg/dL (ref 100–199)
HDL: 53 mg/dL (ref >39)
LDL Chol Calc (NIH): 112 mg/dL — ABNORMAL HIGH (ref 0–99)
Triglycerides: 113 mg/dL (ref 0–149)
VLDL Cholesterol Cal: 20 mg/dL (ref 5–40)

## 2019-01-26 LAB — HIV ANTIBODY (ROUTINE TESTING W REFLEX): HIV Screen 4th Generation wRfx: NONREACTIVE

## 2019-01-28 ENCOUNTER — Telehealth: Payer: Self-pay | Admitting: Family Medicine

## 2019-01-28 NOTE — Telephone Encounter (Signed)
Patient prior auth for Synthroid appeal started. EOC# [id number] call at [phone].   Patient given information to check up on appeal also. AS< CMA

## 2019-01-28 NOTE — Telephone Encounter (Signed)
Patient called wishing to speak with clinic staff about her thyroid meds. Advised that both CMAs were currently unavailable but that I would send a message to have them call back when available.

## 2019-01-30 ENCOUNTER — Other Ambulatory Visit: Payer: Self-pay | Admitting: Cardiology

## 2019-01-30 ENCOUNTER — Telehealth: Payer: Self-pay | Admitting: Family Medicine

## 2019-01-30 ENCOUNTER — Other Ambulatory Visit: Payer: Self-pay | Admitting: Family Medicine

## 2019-01-30 DIAGNOSIS — G894 Chronic pain syndrome: Secondary | ICD-10-CM

## 2019-01-30 DIAGNOSIS — F5104 Psychophysiologic insomnia: Secondary | ICD-10-CM

## 2019-01-30 DIAGNOSIS — Z780 Asymptomatic menopausal state: Secondary | ICD-10-CM

## 2019-01-30 DIAGNOSIS — E2839 Other primary ovarian failure: Secondary | ICD-10-CM

## 2019-01-30 DIAGNOSIS — R5382 Chronic fatigue, unspecified: Secondary | ICD-10-CM

## 2019-01-30 DIAGNOSIS — M159 Polyosteoarthritis, unspecified: Secondary | ICD-10-CM

## 2019-01-30 DIAGNOSIS — F4323 Adjustment disorder with mixed anxiety and depressed mood: Secondary | ICD-10-CM

## 2019-01-30 DIAGNOSIS — M791 Myalgia, unspecified site: Secondary | ICD-10-CM

## 2019-01-30 NOTE — Addendum Note (Signed)
Addended by: Fonnie Mu on: 01/30/2019 10:35 AM   Modules accepted: Orders

## 2019-01-30 NOTE — Telephone Encounter (Signed)
Order placed.  Pt informed.  Charyl Bigger, CMA

## 2019-01-30 NOTE — Telephone Encounter (Signed)
Patient is scheduled for her yearly mammo in a few weeks and wants to do her bone density at the same time but needs an order placed. If approved please place order to [facility]

## 2019-02-04 ENCOUNTER — Ambulatory Visit: Payer: Medicare HMO | Admitting: Family Medicine

## 2019-02-04 ENCOUNTER — Telehealth: Payer: Self-pay | Admitting: Family Medicine

## 2019-02-04 DIAGNOSIS — R5382 Chronic fatigue, unspecified: Secondary | ICD-10-CM

## 2019-02-04 DIAGNOSIS — M791 Myalgia, unspecified site: Secondary | ICD-10-CM

## 2019-02-04 DIAGNOSIS — F5104 Psychophysiologic insomnia: Secondary | ICD-10-CM

## 2019-02-04 DIAGNOSIS — E890 Postprocedural hypothyroidism: Secondary | ICD-10-CM

## 2019-02-04 DIAGNOSIS — G894 Chronic pain syndrome: Secondary | ICD-10-CM

## 2019-02-04 DIAGNOSIS — F4323 Adjustment disorder with mixed anxiety and depressed mood: Secondary | ICD-10-CM

## 2019-02-04 MED ORDER — OMEPRAZOLE 20 MG PO CPDR: DELAYED_RELEASE_CAPSULE | ORAL | 0 refills | Status: DC

## 2019-02-04 MED ORDER — AMITRIPTYLINE HCL 25 MG PO TABS: 50.0000 mg | ORAL_TABLET | Freq: Every day | ORAL | 0 refills | Status: DC

## 2019-02-04 MED ORDER — DULOXETINE HCL 40 MG PO CPEP: 1.0000 | ORAL_CAPSULE | Freq: Every day | ORAL | 0 refills | Status: DC

## 2019-02-04 MED ORDER — BUPROPION HCL ER (XL) 150 MG PO TB24: 150.0000 mg | ORAL_TABLET | Freq: Every day | ORAL | 0 refills | Status: DC

## 2019-02-04 MED ORDER — SPIRONOLACTONE 25 MG PO TABS: 25.0000 mg | ORAL_TABLET | Freq: Every day | ORAL | 0 refills | Status: DC

## 2019-02-04 NOTE — Telephone Encounter (Signed)
Okay to change to the same dose but in generic form if patient is not willing to pay the money for the nongeneric form.     Please tell patient I want her to come in for a lab only TSH and free T4 in 6 weeks to ensure thyroid levels are still stable.   Please let patient know she will need to make a lab only appointment for this and please put in future labs.    The labs were recently checked 10 days ago.    There is no need to check levels prior to her switching to generic.   Please remind patient she should take her thyroid medicines away from all other medicines meaning, not with vitamins or other medications but as many hours away from other medicines as possible.

## 2019-02-04 NOTE — Telephone Encounter (Signed)
Patient states that when she has taken the generic in years past that her TSH levels were "out of wack" but that she is willing to try it again.   Please advise med to send in and follow up plan for rechecking Thyroid. AS, [place]

## 2019-02-04 NOTE — Telephone Encounter (Signed)
Patient taking Synthroid 134mcg (name brand) but it is a Tier III. We did a Tier reduction but it was denied. Patient is paying $44 monthly for rx and says she cant afford it. Please advise. AS, CMA

## 2019-02-04 NOTE — Addendum Note (Signed)
Addended by: Mickel Crow on: 02/04/2019 02:02 PM   Modules accepted: Orders

## 2019-02-04 NOTE — Telephone Encounter (Signed)
Please let her know that there is nothing else we can do if we have already tried to get it preapproved.  Let her know that generic is dirt cheap.

## 2019-02-04 NOTE — Telephone Encounter (Signed)
Patient requesting to speak with clinic staff about her troubles with [facility] and getting her name brand thyroid meds, also she wants to discuss her Wellbutrin too.

## 2019-02-04 NOTE — Telephone Encounter (Signed)
If we already tried to get preapproval for this but it was denied, patient will have to get the nonbrand kind.

## 2019-02-06 MED ORDER — LEVOTHYROXINE SODIUM 175 MCG PO TABS: 175.0000 ug | ORAL_TABLET | Freq: Every day | ORAL | 1 refills | Status: DC

## 2019-02-06 NOTE — Telephone Encounter (Signed)
Future labs ordered. Generic Synthroid of same MCG sent to [facility] in [place] per patient request. She was advised of the below and call was transferred to front desk for fasting lab scheduling. AS, CMA

## 2019-02-06 NOTE — Addendum Note (Signed)
Addended by: Mickel Crow on: 02/06/2019 10:18 AM   Modules accepted: Orders

## 2019-03-11 ENCOUNTER — Other Ambulatory Visit: Payer: Self-pay

## 2019-03-11 ENCOUNTER — Ambulatory Visit: Payer: Medicare HMO | Admitting: Family Medicine

## 2019-03-13 ENCOUNTER — Other Ambulatory Visit: Payer: Medicare HMO

## 2019-03-13 ENCOUNTER — Other Ambulatory Visit: Payer: Self-pay

## 2019-03-13 DIAGNOSIS — E890 Postprocedural hypothyroidism: Secondary | ICD-10-CM | POA: Diagnosis not present

## 2019-03-14 ENCOUNTER — Telehealth: Payer: Self-pay | Admitting: Family Medicine

## 2019-03-14 LAB — T4, FREE: Free T4: 1.61 ng/dL (ref 0.82–1.77)

## 2019-03-14 LAB — TSH: TSH: 2.32 u[IU]/mL (ref 0.450–4.500)

## 2019-03-14 LAB — T3: T3, Total: 127 ng/dL (ref 71–180)

## 2019-03-14 NOTE — Telephone Encounter (Signed)
Patient came by office to spk w/provider Re: significant weight gain w/ generic thyroid med-- Patient wishes to try a different brand -- forwarding paper note left by patient to provider (who reviewed it & states pt will need an Appt to discuss, contacting pt to set up Telehealth/ Virtual Appt.   --FYI to med asst.  --glh

## 2019-04-05 ENCOUNTER — Encounter: Payer: Self-pay | Admitting: Family Medicine

## 2019-04-05 ENCOUNTER — Other Ambulatory Visit: Payer: Self-pay

## 2019-04-05 ENCOUNTER — Ambulatory Visit (INDEPENDENT_AMBULATORY_CARE_PROVIDER_SITE_OTHER): Payer: Medicare HMO | Admitting: Family Medicine

## 2019-04-05 VITALS — BP 142/91 | HR 83 | Ht 66.5 in | Wt 194.0 lb

## 2019-04-05 DIAGNOSIS — I10 Essential (primary) hypertension: Secondary | ICD-10-CM

## 2019-04-05 DIAGNOSIS — R450 Nervousness: Secondary | ICD-10-CM | POA: Diagnosis not present

## 2019-04-05 DIAGNOSIS — E669 Obesity, unspecified: Secondary | ICD-10-CM | POA: Diagnosis not present

## 2019-04-05 DIAGNOSIS — E66811 Obesity, class 1: Secondary | ICD-10-CM

## 2019-04-05 DIAGNOSIS — R635 Abnormal weight gain: Secondary | ICD-10-CM | POA: Diagnosis not present

## 2019-04-05 DIAGNOSIS — F4323 Adjustment disorder with mixed anxiety and depressed mood: Secondary | ICD-10-CM | POA: Diagnosis not present

## 2019-04-05 DIAGNOSIS — F5105 Insomnia due to other mental disorder: Secondary | ICD-10-CM

## 2019-04-05 DIAGNOSIS — F419 Anxiety disorder, unspecified: Secondary | ICD-10-CM

## 2019-04-05 MED ORDER — SPIRONOLACTONE 25 MG PO TABS: 25.0000 mg | ORAL_TABLET | Freq: Every day | ORAL | 0 refills | Status: DC

## 2019-04-05 NOTE — Progress Notes (Signed)
Telehealth office visit note for Amy Hodge, D.O- at [facility] at [facility]   I connected with current patient today and verified that I am speaking with the correct person using two identifiers.   . Location of the patient: Home . Location of the provider: Office Only the patient (+/- their family members at pt's discretion) and myself were participating in the encounter - This visit type was conducted due to national recommendations for restrictions regarding the COVID-19 Pandemic (e.g. social distancing) in an effort to limit this patient's exposure and mitigate transmission in our community.  This format is felt to be most appropriate for this patient at this time.   - No physical exam could be performed with this format, beyond that communicated to [name] by the patient/ family members as noted.   - Additionally my office staff/ schedulers discussed with the patient that there may be a monetary charge related to this service, depending on their medical insurance.   The patient expressed understanding, and agreed to proceed.       History of Present Illness: Hypothyroidism   I, Alonnah Amend, am serving as scribe for Dr. Mellody Hodge.  Notes she's been feeling "so-so," antsy, nervous, jittery.  Says with everything going on during the pandemic, "it's been one thing after the other."  Notes "I've been able to handle the stress and stuff good, but it's gotten down to where I'm real jittery."  Says "the thyroid medication has got my numbers good, but I don't think it's got my self good."  She is concerned about weight gain and stress.  "It's not good for me, I know that, and it's not that I'm actually eating constantly; I may only eat one meal a day because I get full quickly, but then I want to snack or something."  Says "it's like I'm not able to focus on doing one thing, I've got fifteen things going and I can't get nothin' accomplished."  Per patient, "I don't  think I need all of that right at this time.  It's just like all of a sudden I'm real nervous, and then it just goes away."  Notes she was on 60 of Cymbalta for years in the past.  In terms of her weight gain, states she gained 10 lbs in three weeks.  Says "now I weigh more when I first get up than I do when I go to bed."  - Cardiac Health Returns to Dr. Stanford Breed in February.  HPI:  Hypertension:  -  Her blood pressure at home has been running: "higher than normal."  Notes "when I do check it, it has been higher."  Confirms she's been under a lot more stress lately.  Thinks the highest was 150/96.  - Patient reports good compliance with medication and/or lifestyle modification  - Her denies acute concerns or problems related to treatment plan  - She denies new onset of: chest pain, exercise intolerance, shortness of breath, dizziness, visual changes, headache, lower extremity swelling or claudication.   Last 3 blood pressure readings in our office are as follows: BP Readings from Last 3 Encounters:  04/05/19 (!) 142/91  01/25/19 128/88  10/25/18 130/90   Filed Weights   04/05/19 0930  Weight: 194 lb (88 kg)    Most recent cholesterol panel was:  Lab Results  Component Value Date   CHOL 185 01/25/2019   HDL 53 01/25/2019   LDLCALC 112 (H) 01/25/2019   LDLDIRECT 143.0  11/05/2015   TRIG 113 01/25/2019   CHOLHDL 3.5 01/25/2019   Hepatic Function Latest Ref Rng & Units 01/25/2019 02/06/2017 06/28/2016  Total Protein 6.0 - 8.5 g/dL 6.5 6.8 7.5  Albumin 3.8 - 4.8 g/dL 4.5 4.5 4.5  AST 0 - 40 IU/L '18 18 22  ' ALT 0 - 32 IU/L 9 7 12(L)  Alk Phosphatase 39 - 117 IU/L 94 100 92  Total Bilirubin 0.0 - 1.2 mg/dL 0.3 0.3 0.7  Bilirubin, Direct 0.0 - 0.3 mg/dL - - -      GAD 7 : Generalized Anxiety Score 07/19/2018 05/10/2016  Nervous, Anxious, on Edge 0 3  Control/stop worrying 0 3  Worry too much - different things 0 3  Trouble relaxing 0 3  Restless 0 3  Easily annoyed or  irritable 0 3  Afraid - awful might happen 0 0  Total GAD 7 Score 0 18  Anxiety Difficulty Not difficult at all Somewhat difficult    Depression screen [facility] 2/9 04/05/2019 01/25/2019 07/19/2018 06/20/2018 03/21/2018  Decreased Interest 0 0 0 0 0  Down, Depressed, Hopeless 1 0 0 0 0  PHQ - 2 Score 1 0 0 0 0  Altered sleeping 0 0 0 0 0  Tired, decreased energy 1 0 0 0 0  Change in appetite 1 0 0 0 0  Feeling bad or failure about yourself  0 0 0 0 0  Trouble concentrating 0 0 0 0 0  Moving slowly or fidgety/restless 1 0 0 0 0  Suicidal thoughts - 0 0 0 0  PHQ-9 Score 4 0 0 0 0  Difficult doing work/chores Somewhat difficult - Not difficult at all Not difficult at all Not difficult at all  Some recent data might be hidden      Impression and Recommendations:    1. Adjustment disorder with mixed anxiety and depressed mood   2. Insomnia secondary to anxiety   3. Jittery feeling   4. Weight gain- subjective   5. Obesity, Class I, BMI 30-34.9   6. HTN, goal below 130/80      Hypothyroidism - Labs obtained 3 weeks ago. - TSH, free T4, free T3 WNL and stable last check. - Reviewed these results with patient today.  - Advised patient that thyroid medications should not cause weight gain. - Continue management as prescribed.  - Will continue to monitor and re-check as discussed.  Adjustment Disorder w/ Mixed Anxiety & Depressed Mood, Jittery Feeling - Symptoms suboptimally controlled at this time.  - Discussed with patient today that increased stress leads to increased cortisol in the body, which can contribute to weight gain, especially belly fat.  - Reviewed patient's current management on Cymbalta, Wellbutrin, and amitriptyline at night.  - For additional management, advised patient to establish with psychiatry. - Ambulatory referral to psychiatry placed today.  - In addition to prescription intervention, reviewed the "spokes of the wheel" of mood and health management.   Stressed the importance of ongoing prudent habits, including regular exercise, appropriate sleep hygiene, healthful dietary habits, and prayer/meditation to relax.  - Advised patient to exercise outside, speed-walking, hiking, walking trails.  - Encouraged patient to establish with therapy/counseling. - Patient declines therapy/counseling at this time.  - Will continue to monitor alongside specialist.  Cardiac Health - Followed by Cardiology - Discussed that patient's cholesterol is still suboptimally controlled. - Encouraged patient to talk to Dr. Stanford Breed about her cholesterol.  Hypertension - Goal below 130/80 - Blood pressure elevated on intake  today. - Patient prefers to speak with Dr. Stanford Breed in Feb about her BP meds.  - Advised patient to continue to monitor her blood pressure at home daily. - Reviewed goal of 130's/80's or less.  - Encouraged patient to advise Dr. Stanford Breed of her blood pressure log, and let us know if her BP remains suboptimally controlled.  - Advised patient to ask Dr. Stanford Breed about increasing her dose of amlodipine halfway between 2.5 mg and 5 mg.  - Will continue to monitor.  BMI Counseling - Body mass index is 30.84 kg/m - Weight Gain, Subjective Explained to patient what BMI refers to, and what it means medically.    Told patient to think about it as a "medical risk stratification measurement" and how increasing BMI is associated with increasing risk/ or worsening state of various diseases such as hypertension, hyperlipidemia, diabetes, premature OA, depression etc.  American Heart Association guidelines for healthy diet, basically Mediterranean diet, and exercise guidelines of 30 minutes 5 days per week or more discussed in detail.  Health counseling performed.  All questions answered.  - Ambulatory referral to Healthy Weight and Wellness provided today.  Lifestyle & Preventative Health Maintenance - Advised patient to continue working toward  exercising to improve overall mental, physical, and emotional health.    - Encouraged patient to engage in daily physical activity as tolerated, especially a formal exercise routine.  Recommended that the patient eventually strive for at least 150 minutes of moderate cardiovascular activity per week according to guidelines established by the [facility].   - Healthy dietary habits encouraged, including low-carb, and high amounts of lean protein in diet.   - Patient should also consume adequate amounts of water.  - Health counseling performed.  All questions answered.    - As part of my medical decision making, I reviewed the following data within the [place] History obtained from pt /family, CMA notes reviewed and incorporated if applicable, Labs reviewed, Radiograph/ tests reviewed if applicable and OV notes from prior OV's with me, as well as other specialists she/he has seen since seeing me last, were all reviewed and used in my medical decision making process today.    - Additionally, discussion had with patient regarding our treatment plan, and their biases/concerns about that plan were used in my medical decision making today.    - The patient agreed with the plan and demonstrated an understanding of the instructions.   No barriers to understanding were identified.    - Red flag symptoms and signs discussed in detail.  Patient expressed understanding regarding what to do in case of emergency\ urgent symptoms.   - The patient was advised to call back or seek an in-person evaluation if the symptoms worsen or if the condition fails to improve as anticipated.   Return for f/up 3 months in April; pt will discuss BP meds with Dr. Stanford Breed in Feb.    Orders Placed This Encounter  Procedures  . Ambulatory referral to Psychiatry  . Amb Ref to Medical Weight Management    Meds ordered this encounter  Medications  . spironolactone (ALDACTONE) 25 MG tablet    Sig: Take 1  tablet (25 mg total) by mouth daily. Rf per cards    Dispense:  15 tablet    Refill:  0    Medications Discontinued During This Encounter  Medication Reason  . spironolactone (ALDACTONE) 25 MG tablet      I provided 17 minutes of non face-to-face time during this  encounter.  Additional time was spent with charting and coordination of care before and after the actual visit commenced.   Note:  This note was prepared with assistance of Dragon voice recognition software. Occasional wrong-word or sound-a-like substitutions may have occurred due to the inherent limitations of voice recognition software.  This document serves as a record of services personally performed by Amy Dance, DO. It was created on her behalf by Luiza Amend, a trained medical scribe. The creation of this record is based on the scribe's personal observations and the provider's statements to them.   This case required medical decision making of at least moderate complexity. The above documentation from Lakitha Amend, medical scribe, has been reviewed by Marjory Sneddon, D.O.      Patient Care Team    Relationship Specialty Notifications Start End  Amy Dance, DO PCP - General Family Medicine  07/05/16   Lelon Perla, MD Consulting Physician Cardiology  05/10/16   Deneise Lever, MD Consulting Physician Pulmonary Disease  05/10/16   Roseanne Kaufman, MD Consulting Physician Orthopedic Surgery  05/10/16   Garlan Fair, MD Consulting Physician Gastroenterology  05/10/16      -Vitals obtained; medications/ allergies reconciled;  personal medical, social, Sx etc.histories were updated by CMA, reviewed by me and are reflected in chart   Patient Active Problem List   Diagnosis Date Noted  . Adjustment disorder with mixed anxiety and depressed mood 08/02/2016  . Stented coronary artery 02/26/2015  . Coronary artery disease 04/08/2013  . Hyperlipidemia- intol to statins 06/14/2007  . COPD  mixed type ([place]) 06/14/2007  . Lung nodule: benign-appearing, screening CT each December 07/11/2017  . Insomnia secondary to anxiety 05/10/2016  . Obstructive sleep apnea 08/26/2015  . Centrilobular emphysema ([place]) 06/24/2014  . post-operative Hypothyroidism 10/06/2006  . Anxiety state 10/06/2006  . S/P total hysterectomy and bilateral salpingo-oophorectomy 12/30/2016  . Myalgia/ generalized fibromyalgia 08/02/2016  . Vitamin D deficiency 10/07/2009  . Chronic pain syndrome 02/20/2009  . COMMON MIGRAINE 02/20/2009  . Fatigue- chronic 07/15/2008  . GERD 10/06/2006  . Weight gain- subjective 04/05/2019  . Obesity, Class I, BMI 30-34.9 04/05/2019  . HTN, goal below 130/80 04/05/2019  . Hip pain, bilateral 01/25/2019  . OSA and COPD overlap syndrome ([place]) 07/19/2018  . Spasm of muscle of lower back 06/20/2018  . Dysuria  06/20/2018  . Drug intolerance- statin 03/21/2018  . Pure hypercholesterolemia 11/13/2017  . Generalized OA 11/13/2017  . Allergic Rhinosinusitis 11/13/2017  . Chronic cough due to COPD 11/13/2017  . Environmental and seasonal allergies-on Zyrtec, has reactive airway disease component seasonally 07/11/2017  . Senile solar keratosis 02/06/2017  . COPD with acute exacerbation ([place]) 01/11/2017  . ACS (acute coronary syndrome) 06/28/2016  . Crescendo angina ([place]) 06/28/2016  . Cough 04/28/2016  . Shingles rash 08/04/2015  . Allergic reaction 06/27/2015  . History of hemoptysis 06/27/2015  . Nocturnal hypoxemia 02/26/2015  . Hemoptysis 10/24/2014  . Soft tissue injury of multiple sites/bilateral hands 05/16/2013  . Bite from dog 05/15/2013  . Chest pain 05/15/2013  . COPD exacerbation ([place]) 12/31/2010  . Health care maintenance 12/25/2010  . MENOPAUSAL DISORDER 02/16/2010  . OSTEOARTHRITIS, KNEE, RIGHT 02/20/2009  . BACK PAIN 11/27/2008  . Memory loss 07/15/2008  . PERIPHERAL EDEMA 07/15/2008  . KNEE PAIN, BILATERAL 09/05/2007  . Seasonal and perennial allergic  rhinitis 06/14/2007  . Depression 10/06/2006     Current Meds  Medication Sig  . acetaminophen (TYLENOL) 500 MG tablet Take 1,000  mg by mouth every 8 (eight) hours as needed (for pain or headahces).   Marland Kitchen amitriptyline (ELAVIL) 25 MG tablet Take 2 tablets (50 mg total) by mouth at bedtime.  Marland Kitchen amLODipine (NORVASC) 2.5 MG tablet TAKE 1 TABLET DAILY.  Marland Kitchen aspirin EC 81 MG tablet Take 81 mg by mouth daily.  . B Complex Vitamins (VITAMIN B COMPLEX PO) Take 1 tablet by mouth daily.  Marland Kitchen buPROPion (WELLBUTRIN XL) 150 MG 24 hr tablet Take 1 tablet (150 mg total) by mouth daily.  . Cholecalciferol (VITAMIN D3) 5000 units CAPS Take 1 capsule by mouth daily.  . cyclobenzaprine (FLEXERIL) 10 MG tablet Take 1 tablet (10 mg total) by mouth 3 (three) times daily as needed for muscle spasms.  . DULoxetine HCl 40 MG CPEP Take 1 capsule by mouth daily.  . fexofenadine (ALLEGRA) 180 MG tablet Take 1 tablet (180 mg total) by mouth daily.  Marland Kitchen levothyroxine (SYNTHROID) 175 MCG tablet Take 1 tablet (175 mcg total) by mouth daily before breakfast.  . meloxicam (MOBIC) 15 MG tablet TAKE 1 TABLET EVERY DAY  . nitroGLYCERIN (NITROSTAT) 0.4 MG SL tablet Place 0.4 mg under the tongue every 5 (five) minutes as needed for chest pain.  Marland Kitchen omeprazole (PRILOSEC) 20 MG capsule TAKE 1 CAPSULE TWICE DAILY BEFORE MEALS  . spironolactone (ALDACTONE) 25 MG tablet Take 1 tablet (25 mg total) by mouth daily. Rf per cards  . [DISCONTINUED] spironolactone (ALDACTONE) 25 MG tablet Take 1 tablet (25 mg total) by mouth daily.     Allergies:  Allergies  Allergen Reactions  . Bactrim [Sulfamethoxazole-Trimethoprim] Shortness Of Breath, Anxiety and Palpitations  . Ketorolac Shortness Of Breath  . Anoro Ellipta [Umeclidinium-Vilanterol]     Per patient, caused painful blisters in her mouth.   . Morphine Other (See Comments)    headache  . Tetracyclines & Related Itching  . Valsartan Other (See Comments) and Cough    Headaches also   .  Bystolic [Nebivolol Hcl] Other (See Comments)    Fatigue- Tired feeling  . Isosorbide Other (See Comments)    HEADACHES   . Levofloxacin Hives  . Lisinopril Cough  . Metoprolol Other (See Comments)    FATIGUE  . Morphine And Related Other (See Comments)    headache  . Simvastatin Other (See Comments)    Makes legs hurt/ sore   . Singulair [Montelukast Sodium] Palpitations  . Statins Other (See Comments)    MYALGIAS  . Tetracycline Hives and Itching  . Toradol [Ketorolac Tromethamine] Palpitations     ROS:  See above HPI for pertinent positives and negatives   Objective:   Blood pressure (!) 142/91, pulse 83, height 5' 6.5" (1.689 m), weight 194 lb (88 kg).  (if some vitals are omitted, this means that patient was UNABLE to obtain them even though they were asked to get them prior to OV today.  They were asked to call us at their earliest convenience with these once obtained. )  General: A & O * 3; sounds in no acute distress; in usual state of health.  Skin: Pt confirms warm and dry extremities and pink fingertips HEENT: Pt confirms lips non-cyanotic Chest: Patient confirms normal chest excursion and movement Respiratory: speaking in full sentences, no conversational dyspnea; patient confirms no use of accessory muscles Psych: insight appears good, mood- appears full

## 2019-04-11 ENCOUNTER — Telehealth: Payer: Self-pay | Admitting: Family Medicine

## 2019-04-11 ENCOUNTER — Other Ambulatory Visit: Payer: Self-pay | Admitting: Family Medicine

## 2019-04-11 ENCOUNTER — Other Ambulatory Visit: Payer: Self-pay | Admitting: Cardiology

## 2019-04-11 DIAGNOSIS — G894 Chronic pain syndrome: Secondary | ICD-10-CM

## 2019-04-11 DIAGNOSIS — I10 Essential (primary) hypertension: Secondary | ICD-10-CM

## 2019-04-11 DIAGNOSIS — M791 Myalgia, unspecified site: Secondary | ICD-10-CM

## 2019-04-11 DIAGNOSIS — R5382 Chronic fatigue, unspecified: Secondary | ICD-10-CM

## 2019-04-11 DIAGNOSIS — M159 Polyosteoarthritis, unspecified: Secondary | ICD-10-CM

## 2019-04-11 DIAGNOSIS — F5104 Psychophysiologic insomnia: Secondary | ICD-10-CM

## 2019-04-11 DIAGNOSIS — F4323 Adjustment disorder with mixed anxiety and depressed mood: Secondary | ICD-10-CM

## 2019-04-11 NOTE — Telephone Encounter (Signed)
Patient states that pharmacy told her med was for name brand only of the levothyroxine. I advised patient that was the generic for Synthroid. Patient request we call pharmacy to rectify the situation. Pharmacy aware to fill generic for cost purposes. AS, CMA

## 2019-04-11 NOTE — Telephone Encounter (Signed)
Patient is requesting a call back from clinic staff to discuss her thyroid meds that were refilled. Please contact when available.

## 2019-04-12 ENCOUNTER — Telehealth: Payer: Self-pay | Admitting: Family Medicine

## 2019-04-12 NOTE — Telephone Encounter (Signed)
Yes, ok to write for same script- same dose but taking 2 20mg 's instead of one 40mg .    These do not need to be cleared by provider since there is no medication or dose change at all.   Thanks

## 2019-04-12 NOTE — Telephone Encounter (Signed)
Patient states her [employer] will not cover the Duloxetine 40 mg but will cover Duloxetine 20mg . Patient requesting her prescription be changed so insurance will cover. Please advise. AS, CMA

## 2019-04-15 MED ORDER — DULOXETINE HCL 20 MG PO CPEP: 40.0000 mg | ORAL_CAPSULE | Freq: Every day | ORAL | 0 refills | Status: DC

## 2019-04-15 NOTE — Telephone Encounter (Signed)
Patient is aware med sent to pharmacy and instructed how to take med. AS, CMA

## 2019-04-24 NOTE — Progress Notes (Signed)
HPI: FUCAD. Previously followed by Dr Einar Gip. Pt had NSTEMI [id number] with complicated PCI of RCA (performed in [facility]). Echo 3/15 showed normal LV function, mild LVH, grade 1 DD, mild RVE. ABI 12/15 normal.Cath 6/16 showed 100 PDA, patent stents in proximal and mid RCA, no obstructive disease in the left system; collaterals to the PDA noted; PTCA of CTO unsuccessful.Last cath 10/17 showed 100 PDA (filled with left to right collaterals), patent RCA stent and normal LV function. H/O cough with ACEI. Myalgias with crestor.Dr Martinique reviewed and did not think attempt at CTO intervention or CABG was appropriate. CTA June 2020 showed mild aneurysmal dilatation of the ascending thoracic aorta measuring 4 cm.  There was note of emphysema.  Since last seen,she has some dyspnea on exertion.  No syncope.  She continues to have occasional chest pain which can occur both with exertion and at rest.  Similar to previous.  Current Outpatient Medications  Medication Sig Dispense Refill  . acetaminophen (TYLENOL) 500 MG tablet Take 1,000 mg by mouth every 8 (eight) hours as needed (for pain or headahces).     Marland Kitchen amitriptyline (ELAVIL) 25 MG tablet TAKE 2 TABLETS AT BEDTIME 180 tablet 0  . amLODipine (NORVASC) 2.5 MG tablet TAKE 1 TABLET EVERY DAY 90 tablet 0  . aspirin EC 81 MG tablet Take 81 mg by mouth daily.    . B Complex Vitamins (VITAMIN B COMPLEX PO) Take 1 tablet by mouth daily.    Marland Kitchen buPROPion (WELLBUTRIN XL) 150 MG 24 hr tablet Take 1 tablet (150 mg total) by mouth daily. 90 tablet 0  . chlorpheniramine-HYDROcodone (TUSSIONEX PENNKINETIC ER) 10-8 MG/5ML SUER Take 5 mLs by mouth every 12 (twelve) hours as needed for cough. 140 mL 0  . Cholecalciferol (VITAMIN D3) 5000 units CAPS Take 1 capsule by mouth daily.    . cyclobenzaprine (FLEXERIL) 10 MG tablet Take 1 tablet (10 mg total) by mouth 3 (three) times daily as needed for muscle spasms. 30 tablet 0  . DULoxetine (CYMBALTA) 20 MG capsule Take 2 capsules  (40 mg total) by mouth daily. 180 capsule 0  . fexofenadine (ALLEGRA) 180 MG tablet Take 1 tablet (180 mg total) by mouth daily. 90 tablet 3  . levothyroxine (SYNTHROID) 175 MCG tablet Take 1 tablet (175 mcg total) by mouth daily before breakfast. 30 tablet 1  . meloxicam (MOBIC) 15 MG tablet TAKE 1 TABLET EVERY DAY 90 tablet 0  . nitroGLYCERIN (NITROSTAT) 0.4 MG SL tablet Place 0.4 mg under the tongue every 5 (five) minutes as needed for chest pain.    Marland Kitchen omeprazole (PRILOSEC) 20 MG capsule TAKE 1 CAPSULE TWICE DAILY BEFORE MEALS 180 capsule 0  . spironolactone (ALDACTONE) 25 MG tablet TAKE 1 TABLET EVERY DAY 90 tablet 0   No current facility-administered medications for this visit.     Past Medical History:  Diagnosis Date  . Allergy   . Anxiety   . COPD (chronic obstructive pulmonary disease) ([employer])   . Coronary artery disease    a. s/p PCI of RCA in 2015 b. cath in 12/2015 showed 100% Ost RPDA with collaterals present  . DDD (degenerative disc disease), cervical   . Depression   . DJD (degenerative joint disease) of knee    RT  . GERD (gastroesophageal reflux disease)   . Hashimoto's disease   . Headache(784.0)   . Hyperlipidemia   . Hypertension   . Hypothyroidism   . Myocardial infarction ([place])   . Nocturnal  hypoxemia 02/26/2015  . On supplemental oxygen therapy    Oxygen 2.5 l/m at bedtime.  . Stented coronary artery 02/26/2015    Past Surgical History:  Procedure Laterality Date  . ABDOMINAL HYSTERECTOMY    . APPENDECTOMY  1971  . BUNIONECTOMY Right   . CARDIAC CATHETERIZATION N/A 09/02/2014   Procedure: Left Heart Cath and Coronary Angiography;  Surgeon: Adrian Prows, MD;  Location: [place] [facility];  Service: Cardiovascular;  Laterality: N/A;  . CARDIAC CATHETERIZATION N/A 12/15/2015   Procedure: Left Heart Cath and Coronary Angiography;  Surgeon: Jettie Booze, MD;  Location: [place] [facility];  Service: Cardiovascular;  Laterality: N/A;  . CERVICAL FUSION     . CESAREAN SECTION    . COLONOSCOPY WITH PROPOFOL N/A 02/24/2015   Procedure: COLONOSCOPY WITH PROPOFOL;  Surgeon: Garlan Fair, MD;  Location: [facility];  Service: Endoscopy;  Laterality: N/A;  . ESOPHAGOGASTRODUODENOSCOPY (EGD) WITH PROPOFOL N/A 02/24/2015   Procedure: ESOPHAGOGASTRODUODENOSCOPY (EGD) WITH PROPOFOL;  Surgeon: Garlan Fair, MD;  Location: [facility];  Service: Endoscopy;  Laterality: N/A;  . HAND SURGERY     due to trauma-dog bite.  . I & D EXTREMITY Bilateral 05/17/2013   Procedure: IRRIGATION AND DEBRIDEMENT EXTREMITY;  Surgeon: Roseanne Kaufman, MD;  Location: [place];  Service: Orthopedics;  Laterality: Bilateral;  . LEFT HEART CATHETERIZATION WITH CORONARY ANGIOGRAM N/A 03/04/2014   Procedure: LEFT HEART CATHETERIZATION WITH CORONARY ANGIOGRAM;  Surgeon: Laverda Page, MD;  Location: [facility];  Service: Cardiovascular;  Laterality: N/A;  . OOPHORECTOMY    . SHOULDER SURGERY     RT  . THYROIDECTOMY  1996  . TONSILLECTOMY  1965  . TOTAL ABDOMINAL HYSTERECTOMY W/ BILATERAL SALPINGOOPHORECTOMY  01/2008   with cervical dysplasia  . TUBAL LIGATION  1984    Social History   Socioeconomic History  . Marital status: Married    Spouse name: Not on file  . Number of children: 1  . Years of education: Not on file  . Highest education level: Not on file  Occupational History  . Occupation: disabled    Employer: [employer]  Tobacco Use  . Smoking status: Former Smoker    Packs/day: 2.00    Years: 25.00    Pack years: 50.00    Types: Cigarettes    Quit date: 04/23/1996    Years since quitting: 23.0  . Smokeless tobacco: Never Used  Substance and Sexual Activity  . Alcohol use: No    Alcohol/week: 0.0 standard drinks  . Drug use: No  . Sexual activity: Not Currently  Other Topics Concern  . Not on file  Social History Narrative   On worker's comp for lower back and right shoulder - applying for disability; losing [employer] insurance 03/07/2010    Social Determinants of Health   Financial Resource Strain:   . Difficulty of Paying Living Expenses: Not on file  Food Insecurity:   . Worried About Charity fundraiser in the Last Year: Not on file  . Ran Out of Food in the Last Year: Not on file  Transportation Needs:   . Lack of Transportation (Medical): Not on file  . Lack of Transportation (Non-Medical): Not on file  Physical Activity:   . Days of Exercise per Week: Not on file  . Minutes of Exercise per Session: Not on file  Stress:   . Feeling of Stress : Not on file  Social Connections:   . Frequency of Communication with Friends and Family:  Not on file  . Frequency of Social Gatherings with Friends and Family: Not on file  . Attends Religious Services: Not on file  . Active Member of Clubs or Organizations: Not on file  . Attends Archivist Meetings: Not on file  . Marital Status: Not on file  Intimate Partner Violence:   . Fear of Current or Ex-Partner: Not on file  . Emotionally Abused: Not on file  . Physically Abused: Not on file  . Sexually Abused: Not on file    Family History  Problem Relation Age of Onset  . Lung cancer Mother   . Hypertension Mother   . Stroke Mother   . Diabetes Mother   . Diabetes Father   . Hypertension Father   . Heart attack Father   . Asthma Father   . COPD Father   . Alcohol abuse Father   . Diabetes Sister   . Hypertension Sister     ROS: no fevers or chills, productive cough, hemoptysis, dysphasia, odynophagia, melena, hematochezia, dysuria, hematuria, rash, seizure activity, orthopnea, PND, pedal edema, claudication. Remaining systems are negative.  Physical Exam: Well-developed well-nourished in no acute distress.  Skin is warm and dry.  HEENT is normal.  Neck is supple.  Chest is clear to auscultation with normal expansion.  Cardiovascular exam is regular rate and rhythm.  Abdominal exam nontender or distended. No masses palpated. Extremities show no  edema. neuro grossly intact  ECG-sinus rhythm at a rate of 78, no diagnostic ST changes.  Personally reviewed  A/P  1 chest pain-patient continues to have chest pain which has been a chronic issue.  As outlined above CTO was felt not to be a good option and that coronary artery bypass graft also would not be ideal for her anatomy.  Plan to continue medical therapy with calcium blocker.  She did not tolerate beta-blocker, nitrates or Ranexa previously.  2 hyperlipidemia-patient is intolerant to statins and Zetia.  She discontinued Repatha due to expense.  We will try and help with patient assistance.  3 coronary artery disease-continue aspirin.  Intolerant to statins.  4 hypertension-blood pressure controlled.  Continue present medications.  5 dilated aortic root-plan follow-up CTA June 2021.  Kirk Ruths, MD

## 2019-04-26 ENCOUNTER — Other Ambulatory Visit: Payer: Medicare HMO

## 2019-04-26 ENCOUNTER — Ambulatory Visit: Payer: Medicare HMO

## 2019-04-30 ENCOUNTER — Encounter: Payer: Self-pay | Admitting: Cardiology

## 2019-04-30 ENCOUNTER — Ambulatory Visit: Payer: Medicare HMO | Admitting: Cardiology

## 2019-04-30 ENCOUNTER — Other Ambulatory Visit: Payer: Self-pay | Admitting: Cardiology

## 2019-04-30 ENCOUNTER — Telehealth: Payer: Self-pay

## 2019-04-30 ENCOUNTER — Other Ambulatory Visit: Payer: Self-pay

## 2019-04-30 VITALS — BP 124/88 | HR 78 | Ht 66.5 in | Wt 197.0 lb

## 2019-04-30 DIAGNOSIS — I2510 Atherosclerotic heart disease of native coronary artery without angina pectoris: Secondary | ICD-10-CM | POA: Diagnosis not present

## 2019-04-30 DIAGNOSIS — I712 Thoracic aortic aneurysm, without rupture, unspecified: Secondary | ICD-10-CM

## 2019-04-30 DIAGNOSIS — R072 Precordial pain: Secondary | ICD-10-CM

## 2019-04-30 DIAGNOSIS — I10 Essential (primary) hypertension: Secondary | ICD-10-CM | POA: Diagnosis not present

## 2019-04-30 DIAGNOSIS — E7800 Pure hypercholesterolemia, unspecified: Secondary | ICD-10-CM

## 2019-04-30 MED ORDER — REPATHA SURECLICK 140 MG/ML ~~LOC~~ SOAJ: 140.0000 mg | SUBCUTANEOUS | 11 refills | Status: DC

## 2019-04-30 NOTE — Patient Instructions (Signed)
Medication Instructions:  NO CHANGE *If you need a refill on your cardiac medications before your next appointment, please call your pharmacy*  Lab Work: If you have labs (blood work) drawn today and your tests are completely normal, you will receive your results only by: . MyChart Message (if you have MyChart) OR . A paper copy in the mail If you have any lab test that is abnormal or we need to change your treatment, we will call you to review the results.  Follow-Up: At [facility], you and your health needs are our priority.  As part of our continuing mission to provide you with exceptional heart care, we have created designated Provider Care Teams.  These Care Teams include your primary Cardiologist (physician) and Advanced Practice Providers (APPs -  Physician Assistants and Nurse Practitioners) who all work together to provide you with the care you need, when you need it.  Your next appointment:   6 month(s)  The format for your next appointment:   Either In Person or Virtual  Provider:   You may see BRIAN CRENSHAW MD or one of the following Advanced Practice Providers on your designated Care Team:    Luke Kilroy, PA-C  Callie Goodrich, PA-C  Jesse Cleaver, FNP    

## 2019-04-30 NOTE — Telephone Encounter (Signed)
Emailed the pt to apply for repatha ready as instructed by raquel the pharmd

## 2019-04-30 NOTE — Telephone Encounter (Signed)
Called and spoke w/pt regarding the pcsk9 and the patient is wanting to try to get it so I got her set up with a copay card, pt voiced understanding

## 2019-04-30 NOTE — Telephone Encounter (Signed)
Pt called to let me know that the copay card wouldn't work (which I figured) and that they can't afford the $45 monthly and that they were denied [facility]. I am not sure what else I can do at this point. I will route to Pharmd pool.

## 2019-04-30 NOTE — Telephone Encounter (Signed)
-----   Message from [place], [facility] sent at 04/30/2019 10:10 AM EST ----- Will you, please call patient and check  problems with cost? You tried to reached her last year and she never called back.   Amy Hodge ----- Message ----- From: Cristopher Estimable, RN Sent: 04/30/2019   9:33 AM EST To: [facility] Pharmd  This patient of Brian's stopped the repatha due to cost can you please reach out to her to see if there is anything we can help her with please and thank you

## 2019-05-07 ENCOUNTER — Other Ambulatory Visit: Payer: Self-pay | Admitting: Family Medicine

## 2019-05-07 DIAGNOSIS — F4323 Adjustment disorder with mixed anxiety and depressed mood: Secondary | ICD-10-CM

## 2019-05-14 ENCOUNTER — Telehealth: Payer: Self-pay | Admitting: Family Medicine

## 2019-05-14 DIAGNOSIS — E890 Postprocedural hypothyroidism: Secondary | ICD-10-CM

## 2019-05-14 MED ORDER — LEVOTHYROXINE SODIUM 175 MCG PO TABS: 175.0000 ug | ORAL_TABLET | Freq: Every day | ORAL | 1 refills | Status: DC

## 2019-05-14 NOTE — Addendum Note (Signed)
Addended by: Mickel Crow on: 05/14/2019 11:42 AM   Modules accepted: Orders

## 2019-05-14 NOTE — Telephone Encounter (Signed)
Please let patient know med sent to pharmacy. AS, CMA

## 2019-05-14 NOTE — Telephone Encounter (Signed)
Patient is requesting a refill of her thyroid med. If approved please send to her [facility] on [address] in [place]

## 2019-05-21 ENCOUNTER — Other Ambulatory Visit: Payer: Self-pay | Admitting: Family Medicine

## 2019-06-26 ENCOUNTER — Other Ambulatory Visit: Payer: Self-pay | Admitting: Family Medicine

## 2019-06-26 DIAGNOSIS — F4323 Adjustment disorder with mixed anxiety and depressed mood: Secondary | ICD-10-CM

## 2019-06-26 DIAGNOSIS — F5104 Psychophysiologic insomnia: Secondary | ICD-10-CM

## 2019-06-26 DIAGNOSIS — R5382 Chronic fatigue, unspecified: Secondary | ICD-10-CM

## 2019-06-26 DIAGNOSIS — M791 Myalgia, unspecified site: Secondary | ICD-10-CM

## 2019-06-26 DIAGNOSIS — G894 Chronic pain syndrome: Secondary | ICD-10-CM

## 2019-06-26 MED ORDER — OMEPRAZOLE 20 MG PO CPDR: DELAYED_RELEASE_CAPSULE | ORAL | 0 refills | Status: DC

## 2019-06-26 MED ORDER — DULOXETINE HCL 20 MG PO CPEP: 40.0000 mg | ORAL_CAPSULE | Freq: Every day | ORAL | 0 refills | Status: DC

## 2019-06-26 MED ORDER — AMITRIPTYLINE HCL 25 MG PO TABS: 50.0000 mg | ORAL_TABLET | Freq: Every day | ORAL | 0 refills | Status: DC

## 2019-06-28 DIAGNOSIS — H1870 Unspecified corneal deformity: Secondary | ICD-10-CM | POA: Diagnosis not present

## 2019-06-28 DIAGNOSIS — H18829 Corneal disorder due to contact lens, unspecified eye: Secondary | ICD-10-CM | POA: Diagnosis not present

## 2019-06-28 DIAGNOSIS — H16142 Punctate keratitis, left eye: Secondary | ICD-10-CM | POA: Diagnosis not present

## 2019-06-28 DIAGNOSIS — H02054 Trichiasis without entropian left upper eyelid: Secondary | ICD-10-CM | POA: Diagnosis not present

## 2019-07-09 ENCOUNTER — Ambulatory Visit
Admission: RE | Admit: 2019-07-09 | Discharge: 2019-07-09 | Disposition: A | Discharge status: Home / Self Care | Payer: Medicare HMO | Source: Ambulatory Visit | Attending: Family Medicine | Admitting: Family Medicine

## 2019-07-09 ENCOUNTER — Other Ambulatory Visit: Payer: Self-pay

## 2019-07-09 DIAGNOSIS — E2839 Other primary ovarian failure: Secondary | ICD-10-CM

## 2019-07-09 DIAGNOSIS — Z780 Asymptomatic menopausal state: Secondary | ICD-10-CM

## 2019-07-09 DIAGNOSIS — Z1231 Encounter for screening mammogram for malignant neoplasm of breast: Secondary | ICD-10-CM

## 2019-07-24 DIAGNOSIS — H524 Presbyopia: Secondary | ICD-10-CM | POA: Diagnosis not present

## 2019-07-24 DIAGNOSIS — H2513 Age-related nuclear cataract, bilateral: Secondary | ICD-10-CM | POA: Diagnosis not present

## 2019-07-25 ENCOUNTER — Ambulatory Visit (INDEPENDENT_AMBULATORY_CARE_PROVIDER_SITE_OTHER): Payer: Medicare HMO | Admitting: Physician Assistant

## 2019-07-25 ENCOUNTER — Other Ambulatory Visit: Payer: Self-pay

## 2019-07-25 ENCOUNTER — Encounter: Payer: Self-pay | Admitting: Physician Assistant

## 2019-07-25 VITALS — BP 138/87 | HR 94 | Temp 98.3°F | Ht 65.0 in | Wt 197.1 lb

## 2019-07-25 DIAGNOSIS — M62830 Muscle spasm of back: Secondary | ICD-10-CM | POA: Diagnosis not present

## 2019-07-25 DIAGNOSIS — R635 Abnormal weight gain: Secondary | ICD-10-CM

## 2019-07-25 DIAGNOSIS — M791 Myalgia, unspecified site: Secondary | ICD-10-CM | POA: Diagnosis not present

## 2019-07-25 DIAGNOSIS — R6889 Other general symptoms and signs: Secondary | ICD-10-CM | POA: Diagnosis not present

## 2019-07-25 DIAGNOSIS — G894 Chronic pain syndrome: Secondary | ICD-10-CM

## 2019-07-25 DIAGNOSIS — E890 Postprocedural hypothyroidism: Secondary | ICD-10-CM

## 2019-07-25 DIAGNOSIS — M159 Polyosteoarthritis, unspecified: Secondary | ICD-10-CM | POA: Diagnosis not present

## 2019-07-25 DIAGNOSIS — E039 Hypothyroidism, unspecified: Secondary | ICD-10-CM | POA: Diagnosis not present

## 2019-07-25 DIAGNOSIS — R05 Cough: Secondary | ICD-10-CM

## 2019-07-25 DIAGNOSIS — K219 Gastro-esophageal reflux disease without esophagitis: Secondary | ICD-10-CM

## 2019-07-25 DIAGNOSIS — R5382 Chronic fatigue, unspecified: Secondary | ICD-10-CM | POA: Diagnosis not present

## 2019-07-25 DIAGNOSIS — E785 Hyperlipidemia, unspecified: Secondary | ICD-10-CM | POA: Diagnosis not present

## 2019-07-25 DIAGNOSIS — I10 Essential (primary) hypertension: Secondary | ICD-10-CM

## 2019-07-25 DIAGNOSIS — R5383 Other fatigue: Secondary | ICD-10-CM | POA: Diagnosis not present

## 2019-07-25 DIAGNOSIS — R053 Chronic cough: Secondary | ICD-10-CM

## 2019-07-25 DIAGNOSIS — F4323 Adjustment disorder with mixed anxiety and depressed mood: Secondary | ICD-10-CM | POA: Diagnosis not present

## 2019-07-25 MED ORDER — LEVOTHYROXINE SODIUM 175 MCG PO TABS: 175.0000 ug | ORAL_TABLET | Freq: Every day | ORAL | 0 refills | Status: DC

## 2019-07-25 MED ORDER — MELOXICAM 15 MG PO TABS: 15.0000 mg | ORAL_TABLET | Freq: Every day | ORAL | 0 refills | Status: DC

## 2019-07-25 MED ORDER — BUPROPION HCL ER (XL) 150 MG PO TB24: 150.0000 mg | ORAL_TABLET | Freq: Every day | ORAL | 0 refills | Status: DC

## 2019-07-25 MED ORDER — SPIRONOLACTONE 25 MG PO TABS: 25.0000 mg | ORAL_TABLET | Freq: Every day | ORAL | 0 refills | Status: DC

## 2019-07-25 MED ORDER — AMITRIPTYLINE HCL 25 MG PO TABS: 50.0000 mg | ORAL_TABLET | Freq: Every day | ORAL | 0 refills | Status: DC

## 2019-07-25 MED ORDER — CYCLOBENZAPRINE HCL 10 MG PO TABS: 10.0000 mg | ORAL_TABLET | Freq: Three times a day (TID) | ORAL | 0 refills | Status: DC | PRN

## 2019-07-25 MED ORDER — DULOXETINE HCL 20 MG PO CPEP: 40.0000 mg | ORAL_CAPSULE | Freq: Every day | ORAL | 0 refills | Status: DC

## 2019-07-25 MED ORDER — OMEPRAZOLE 20 MG PO CPDR: 20.0000 mg | DELAYED_RELEASE_CAPSULE | Freq: Two times a day (BID) | ORAL | 0 refills | Status: DC

## 2019-07-25 NOTE — Patient Instructions (Signed)
Angina  Angina is extreme discomfort in the chest, neck, arm, jaw, or back. The discomfort is caused by a lack of blood in the middle layer of the heart wall (myocardium). There are four types of angina:  Stable angina. This is triggered by vigorous activity or exercise. It goes away when you rest or take angina medicine.  Unstable angina. This is a warning sign and can lead to a heart attack (acute coronary syndrome). This is a medical emergency. Symptoms come at rest and last a long time.  Microvascular angina. This affects the small coronary arteries. Symptoms include feeling tired and being short of breath.  Prinzmetal or variant angina. This is caused by a tightening (spasm) of the arteries that go to your heart. What are the causes? This condition is caused by atherosclerosis. This is the buildup of fat and cholesterol (plaque) in your arteries. The plaque may narrow or block the artery. Other causes of angina include:  Sudden tightening of the muscles of the arteries in the heart (coronary spasm).  Small artery disease (microvascular dysfunction).  Problems with any of your heart valves (heart valve disease).  A tear in an artery in your heart (coronary artery dissection).  Diseases of the heart muscle (cardiomyopathy), or other heart diseases. What increases the risk? You are more likely to develop this condition if you have:  High cholesterol.  High blood pressure (hypertension).  Diabetes.  A family history of heart disease.  An inactive (sedentary) lifestyle, or you do not exercise enough.  Depression.  Had radiation treatment to the left side of your chest. Other risk factors include:  Using tobacco.  Being obese.  Eating a diet high in saturated fats.  Being exposed to high stress or triggers of stress.  Using drugs, such as cocaine. Women have a greater risk for angina if:  They are older than 55.  They have gone through menopause (are  postmenopausal). What are the signs or symptoms? Common symptoms of this condition in both men and women may include:  Chest pain, which may: ? Feel like a crushing or squeezing in the chest, or like a tightness, pressure, fullness, or heaviness in the chest. ? Last for more than a few minutes at a time, or it may stop and come back (recur) over the course of a few minutes.  Pain in the neck, arm, jaw, or back.  Unexplained heartburn or indigestion.  Shortness of breath.  Nausea.  Sudden cold sweats. Women and people with diabetes may have unusual (atypical) symptoms, such as:  Fatigue.  Unexplained feelings of nervousness or anxiety.  Unexplained weakness.  Dizziness or fainting. How is this diagnosed? This condition may be diagnosed based on:  Your symptoms and medical history.  Electrocardiogram (ECG) to measure the electrical activity in your heart.  Blood tests.  Stress test to look for signs of blockage when your heart is stressed.  CT angiogram to examine your heart and the blood flow to it.  Coronary angiogram to check your coronary arteries for blockage. How is this treated? Angina may be treated with:  Medicines to: ? Prevent blood clots and heart attack. ? Relax blood vessels and improve blood flow to the heart (nitrates). ? Reduce blood pressure, improve the pumping action of the heart, and relax blood vessels that are spasming. ? Reduce cholesterol and help treat atherosclerosis.  A procedure to widen a narrowed or blocked coronary artery (angioplasty). A mesh tube may be placed in a coronary artery to   keep it open (coronary stenting).  Surgery to allow blood to go around a blocked artery (coronary artery bypass surgery). Follow these instructions at home: Medicines  Take over-the-counter and prescription medicines only as told by your health care provider.  Do not take the following medicines unless your health care provider approves: ? NSAIDs,  such as ibuprofen or naproxen. ? Vitamin supplements that contain vitamin A, vitamin E, or both. ? Hormone replacement therapy that contains estrogen with or without progestin. Eating and drinking   Eat a heart-healthy diet. This includes plenty of fresh fruits and vegetables, whole grains, low-fat (lean) protein, and low-fat dairy products.  Follow instructions from your health care provider about eating or drinking restrictions. Activity  Follow an exercise program approved by your health care provider.  Consider joining a cardiac rehabilitation program.  Take a break when you feel fatigued. Plan rest periods in your daily activities. Lifestyle   Do not use any products that contain nicotine or tobacco, such as cigarettes, e-cigarettes, and chewing tobacco. If you need help quitting, ask your health care provider.  If your health care provider says you can drink alcohol: ? Limit how much you use to:  0-1 drink a day for nonpregnant women.  0-2 drinks a day for men. ? Be aware of how much alcohol is in your drink. In the [place] one drink equals one 12 oz bottle of beer (355 mL), one 5 oz glass of wine (148 mL), or one 1 oz glass of hard liquor (44 mL). General instructions  Maintain a healthy weight.  Learn to manage stress.  Keep your vaccinations up to date. Get the flu (influenza) vaccine every year.  Talk to your health care provider if you feel depressed. Take a depression screening test to see if you are at risk for depression.  Work with your health care provider to manage other health conditions, such as hypertension or diabetes.  Keep all follow-up visits as told by your health care provider. This is important. Get help right away if:  You have pain in your chest, neck, arm, jaw, or back, and the pain: ? Lasts more than a few minutes. ? Is recurring. ? Is not relieved by taking medicines under the tongue (sublingual nitroglycerin). ? Increases in intensity or  frequency.  You have a lot of sweating without cause.  You have unexplained: ? Heartburn or indigestion. ? Shortness of breath or difficulty breathing. ? Nausea or vomiting. ? Fatigue. ? Feelings of nervousness or anxiety. ? Weakness.  You have sudden light-headedness or dizziness.  You faint. These symptoms may represent a serious problem that is an emergency. Do not wait to see if the symptoms will go away. Get medical help right away. Call your local emergency services (911 in the [place] Do not drive yourself to the hospital. Summary  Angina is extreme discomfort in the chest, neck, arm, jaw, or back that is caused by a lack of blood in the heart wall.  There are many symptoms of angina. They include chest pain, unexplained heartburn or indigestion, sudden cold sweats, and fatigue.  Angina may be treated with behavioral changes, medicine, or surgery.  Symptoms of angina may represent an emergency. Get medical help right away. Call your local emergency services (911 in the [place] Do not drive yourself to the hospital. This information is not intended to replace advice given to you by your health care provider. Make sure you discuss any questions you have with your health care provider.   Document Revised: 10/09/2017 Document Reviewed: 10/09/2017 Elsevier Patient Education  2020 [employer]  

## 2019-07-25 NOTE — Progress Notes (Signed)
Established Patient Office Visit  Subjective:  Patient ID: Amy Hodge, female    DOB: 04-May-1955  Age: 64 y.o. MRN: [id number]  CC:  Chief Complaint  Patient presents with  . Hypertension  . Hypothyroidism    HPI Amy Hodge presents for follow-up on hypertension, hyperlipidemia, hypothyroid, mood management, fibromyalgia, and medication refills.   HTN: Pt denies palpitations, dizziness, or lower extremity edema. Pt reports she continues to have chronic chest pain and left arm pain. States when going to the mailbox she has to take several rests. She saw cardiology 04/2019 and advised to continue calcium channel blocker. Taking medication as directed without side effects. Checks BP at home occasionally- once/month.   Hyperlipidemia: Pt reports she is unable to afford Repatha. She is trying to control elevated cholesterol with diet. She has increased consumption of vegetables and fish.   Hypothyroid: Pt reports cold intolerance, fatigue, and feels like she's still gaining weight. Last thyroid panel wnl's. Requests refill of levothyroxine.   Mood: Stable with current medication regimen.  Fibromyalgia: Requests refills.   COPD: Reports worsening chronic cough with increased shortness of breath. States she needs to follow-up with pulmonology. Last OV was 10/2018.  Past Medical History:  Diagnosis Date  . Allergy   . Anxiety   . COPD (chronic obstructive pulmonary disease) ([place])   . Coronary artery disease    a. s/p PCI of RCA in 2015 b. cath in 12/2015 showed 100% Ost RPDA with collaterals present  . DDD (degenerative disc disease), cervical   . Depression   . DJD (degenerative joint disease) of knee    RT  . GERD (gastroesophageal reflux disease)   . Hashimoto's disease   . Headache(784.0)   . Hyperlipidemia   . Hypertension   . Hypothyroidism   . Myocardial infarction ([place])   . Nocturnal hypoxemia 02/26/2015  . On supplemental oxygen therapy    Oxygen 2.5 l/m  at bedtime.  . Stented coronary artery 02/26/2015    Past Surgical History:  Procedure Laterality Date  . ABDOMINAL HYSTERECTOMY    . APPENDECTOMY  1971  . BUNIONECTOMY Right   . CARDIAC CATHETERIZATION N/A 09/02/2014   Procedure: Left Heart Cath and Coronary Angiography;  Surgeon: Adrian Prows, MD;  Location: [facility];  Service: Cardiovascular;  Laterality: N/A;  . CARDIAC CATHETERIZATION N/A 12/15/2015   Procedure: Left Heart Cath and Coronary Angiography;  Surgeon: Jettie Booze, MD;  Location: [facility];  Service: Cardiovascular;  Laterality: N/A;  . CERVICAL FUSION    . CESAREAN SECTION    . COLONOSCOPY WITH PROPOFOL N/A 02/24/2015   Procedure: COLONOSCOPY WITH PROPOFOL;  Surgeon: Garlan Fair, MD;  Location: [facility];  Service: Endoscopy;  Laterality: N/A;  . ESOPHAGOGASTRODUODENOSCOPY (EGD) WITH PROPOFOL N/A 02/24/2015   Procedure: ESOPHAGOGASTRODUODENOSCOPY (EGD) WITH PROPOFOL;  Surgeon: Garlan Fair, MD;  Location: [facility];  Service: Endoscopy;  Laterality: N/A;  . HAND SURGERY     due to trauma-dog bite.  . I & D EXTREMITY Bilateral 05/17/2013   Procedure: IRRIGATION AND DEBRIDEMENT EXTREMITY;  Surgeon: Roseanne Kaufman, MD;  Location: [facility];  Service: Orthopedics;  Laterality: Bilateral;  . LEFT HEART CATHETERIZATION WITH CORONARY ANGIOGRAM N/A 03/04/2014   Procedure: LEFT HEART CATHETERIZATION WITH CORONARY ANGIOGRAM;  Surgeon: Laverda Page, MD;  Location: [facility];  Service: Cardiovascular;  Laterality: N/A;  . OOPHORECTOMY    . SHOULDER SURGERY     RT  . THYROIDECTOMY  1996  .  TONSILLECTOMY  1965  . TOTAL ABDOMINAL HYSTERECTOMY W/ BILATERAL SALPINGOOPHORECTOMY  01/2008   with cervical dysplasia  . TUBAL LIGATION  1984    Family History  Problem Relation Age of Onset  . Lung cancer Mother   . Hypertension Mother   . Stroke Mother   . Diabetes Mother   . Diabetes Father   . Hypertension Father   . Heart attack Father    . Asthma Father   . COPD Father   . Alcohol abuse Father   . Diabetes Sister   . Hypertension Sister     Social History   Socioeconomic History  . Marital status: Married    Spouse name: Not on file  . Number of children: 1  . Years of education: Not on file  . Highest education level: Not on file  Occupational History  . Occupation: disabled    Employer: [employer]  Tobacco Use  . Smoking status: Former Smoker    Packs/day: 2.00    Years: 25.00    Pack years: 50.00    Types: Cigarettes    Quit date: 04/23/1996    Years since quitting: 23.2  . Smokeless tobacco: Never Used  Substance and Sexual Activity  . Alcohol use: No    Alcohol/week: 0.0 standard drinks  . Drug use: No  . Sexual activity: Not Currently  Other Topics Concern  . Not on file  Social History Narrative   On worker's comp for lower back and right shoulder - applying for disability; losing [employer] insurance 03/07/2010   Social Determinants of Health   Financial Resource Strain:   . Difficulty of Paying Living Expenses:   Food Insecurity:   . Worried About Charity fundraiser in the Last Year:   . Arboriculturist in the Last Year:   Transportation Needs:   . Film/video editor (Medical):   Marland Kitchen Lack of Transportation (Non-Medical):   Physical Activity:   . Days of Exercise per Week:   . Minutes of Exercise per Session:   Stress:   . Feeling of Stress :   Social Connections:   . Frequency of Communication with Friends and Family:   . Frequency of Social Gatherings with Friends and Family:   . Attends Religious Services:   . Active Member of Clubs or Organizations:   . Attends Archivist Meetings:   Marland Kitchen Marital Status:   Intimate Partner Violence:   . Fear of Current or Ex-Partner:   . Emotionally Abused:   Marland Kitchen Physically Abused:   . Sexually Abused:     Outpatient Medications Prior to Visit  Medication Sig Dispense Refill  . acetaminophen (TYLENOL) 500 MG tablet Take 1,000 mg by  mouth every 8 (eight) hours as needed (for pain or headahces).     Marland Kitchen amLODipine (NORVASC) 2.5 MG tablet TAKE 1 TABLET EVERY DAY 90 tablet 0  . aspirin EC 81 MG tablet Take 81 mg by mouth daily.    . B Complex Vitamins (VITAMIN B COMPLEX PO) Take 1 tablet by mouth daily.    . chlorpheniramine-HYDROcodone (TUSSIONEX PENNKINETIC ER) 10-8 MG/5ML SUER Take 5 mLs by mouth every 12 (twelve) hours as needed for cough. 140 mL 0  . Cholecalciferol (VITAMIN D3) 5000 units CAPS Take 1 capsule by mouth daily.    . Evolocumab (REPATHA SURECLICK) 283 MG/ML SOAJ Inject 140 mg into the skin every 14 (fourteen) days. 2 pen 11  . fexofenadine (ALLEGRA) 180 MG tablet Take 1 tablet (180  mg total) by mouth daily. 90 tablet 3  . nitroGLYCERIN (NITROSTAT) 0.4 MG SL tablet Place 0.4 mg under the tongue every 5 (five) minutes as needed for chest pain.    Marland Kitchen amitriptyline (ELAVIL) 25 MG tablet Take 2 tablets (50 mg total) by mouth at bedtime. 60 tablet 0  . buPROPion (WELLBUTRIN XL) 150 MG 24 hr tablet TAKE 1 TABLET EVERY DAY 90 tablet 0  . cyclobenzaprine (FLEXERIL) 10 MG tablet Take 1 tablet (10 mg total) by mouth 3 (three) times daily as needed for muscle spasms. 30 tablet 0  . DULoxetine (CYMBALTA) 20 MG capsule Take 2 capsules (40 mg total) by mouth daily. **PATIENT NEEDS APT FOR FURTHER REFILLS** 60 capsule 0  . levothyroxine (SYNTHROID) 175 MCG tablet Take 1 tablet (175 mcg total) by mouth daily before breakfast. 30 tablet 1  . meloxicam (MOBIC) 15 MG tablet TAKE 1 TABLET EVERY DAY 90 tablet 0  . omeprazole (PRILOSEC) 20 MG capsule TAKE 1 CAPSULE TWICE DAILY BEFORE MEALS 60 capsule 0  . spironolactone (ALDACTONE) 25 MG tablet TAKE 1 TABLET EVERY DAY 90 tablet 0   No facility-administered medications prior to visit.    Allergies  Allergen Reactions  . Bactrim [Sulfamethoxazole-Trimethoprim] Shortness Of Breath, Anxiety and Palpitations  . Ketorolac Shortness Of Breath  . Anoro Ellipta [Umeclidinium-Vilanterol]      Per patient, caused painful blisters in her mouth.   . Morphine Other (See Comments)    headache  . Tetracyclines & Related Itching  . Valsartan Other (See Comments) and Cough    Headaches also   . Bystolic [Nebivolol Hcl] Other (See Comments)    Fatigue- Tired feeling  . Isosorbide Other (See Comments)    HEADACHES   . Levofloxacin Hives  . Lisinopril Cough  . Metoprolol Other (See Comments)    FATIGUE  . Morphine And Related Other (See Comments)    headache  . Simvastatin Other (See Comments)    Makes legs hurt/ sore   . Singulair [Montelukast Sodium] Palpitations  . Statins Other (See Comments)    MYALGIAS  . Tetracycline Hives and Itching  . Toradol [Ketorolac Tromethamine] Palpitations    ROS Review of Systems Review of Systems: General: Denies fever, chills, unexplained weight loss.  Optho/Auditory:  Denies visual changes, blurred vision/LOV Respiratory:  Denies hemoptysis   Cardiovascular: Denies new onset peripheral edema  Gastrointestinal: Denies nausea, vomiting, diarrhea.  Genitourinary: Denies dysuria, freq/ urgency, flank pain  Endocrine:   Denies hot intolerance, polyuria, polydipsia Musculoskeletal: Denies joint swelling, unexplained arthralgias, gait problems.  Skin:  Denies rash, suspicious lesions Neurological: Denies dizziness, unexplained weakness, numbness  Psychiatric/Behavioral: Denies mood changes, suicidal or homicidal ideations, hallucinations   Objective:    Physical Exam General: Well nourished, in no apparent distress. Eyes: PERRLA, EOMs, conjunctiva clr Resp: Respiratory effort- normal, ECTA B/L  Cardio: RRR  Abdomen: no gross distention. Lymphatics:  less 2 sec cap RF M-sk: Full ROM, normal gait.  Skin: Warm, dry  Neuro: Alert, Oriented Psych: Normal affect, Insight and Judgment appropriate. Euthymic mood.   BP 138/87   Pulse 94   Temp 98.3 F (36.8 C) (Oral)   Ht '5\' 5"'  (1.651 m)   Wt 197 lb 1.6 oz (89.4 kg)   SpO2 94%    BMI 32.80 kg/m  Wt Readings from Last 3 Encounters:  07/31/19 194 lb 9.6 oz (88.3 kg)  07/25/19 197 lb 1.6 oz (89.4 kg)  04/30/19 197 lb (89.4 kg)     Health Maintenance Due  Topic Date Due  . COVID-19 Vaccine (1) Never done    There are no preventive care reminders to display for this patient.  Lab Results  Component Value Date   TSH 1.960 07/25/2019   Lab Results  Component Value Date   WBC 8.2 07/25/2019   HGB 14.2 07/25/2019   HCT 41.7 07/25/2019   MCV 87 07/25/2019   PLT 301 07/25/2019   Lab Results  Component Value Date   NA 141 07/25/2019   K 4.8 07/25/2019   CO2 25 07/25/2019   GLUCOSE 74 07/25/2019   BUN 16 07/25/2019   CREATININE 0.87 07/25/2019   BILITOT <0.2 07/25/2019   ALKPHOS 89 07/25/2019   AST 21 07/25/2019   ALT 10 07/25/2019   PROT 6.9 07/25/2019   ALBUMIN 4.7 07/25/2019   CALCIUM 9.6 07/25/2019   ANIONGAP 9 06/28/2016   GFR 66.63 11/05/2015   Lab Results  Component Value Date   CHOL 185 01/25/2019   Lab Results  Component Value Date   HDL 53 01/25/2019   Lab Results  Component Value Date   LDLCALC 112 (H) 01/25/2019   Lab Results  Component Value Date   TRIG 113 01/25/2019   Lab Results  Component Value Date   CHOLHDL 3.5 01/25/2019   Lab Results  Component Value Date   HGBA1C 5.4 01/25/2019      Assessment & Plan:   Problem List Items Addressed This Visit      Cardiovascular and Mediastinum   HTN, goal below 130/80 - Primary   Relevant Medications   spironolactone (ALDACTONE) 25 MG tablet   Other Relevant Orders   Comp Met (CMET) (Completed)     Digestive   GERD   Relevant Medications   omeprazole (PRILOSEC) 20 MG capsule     Endocrine   post-operative Hypothyroidism (Chronic)   Relevant Medications   levothyroxine (SYNTHROID) 175 MCG tablet   Other Relevant Orders   TSH (Completed)   T4, free (Completed)     Musculoskeletal and Integument   Generalized OA   Relevant Medications   cyclobenzaprine  (FLEXERIL) 10 MG tablet   meloxicam (MOBIC) 15 MG tablet     Other   Hyperlipidemia- intol to statins (Chronic)   Relevant Medications   spironolactone (ALDACTONE) 25 MG tablet   Fatigue- chronic (Chronic)   Relevant Medications   amitriptyline (ELAVIL) 25 MG tablet   Other Relevant Orders   TSH (Completed)   T4, free (Completed)   CBC w/Diff (Completed)   Comp Met (CMET) (Completed)   Myalgia/ generalized fibromyalgia (Chronic)   Relevant Medications   amitriptyline (ELAVIL) 25 MG tablet   DULoxetine (CYMBALTA) 20 MG capsule   Chronic cough due to COPD (Chronic)   Chronic pain syndrome   Relevant Medications   amitriptyline (ELAVIL) 25 MG tablet   buPROPion (WELLBUTRIN XL) 150 MG 24 hr tablet   cyclobenzaprine (FLEXERIL) 10 MG tablet   DULoxetine (CYMBALTA) 20 MG capsule   meloxicam (MOBIC) 15 MG tablet   Adjustment disorder with mixed anxiety and depressed mood   Relevant Medications   amitriptyline (ELAVIL) 25 MG tablet   buPROPion (WELLBUTRIN XL) 150 MG 24 hr tablet   Spasm of muscle of lower back   Relevant Medications   cyclobenzaprine (FLEXERIL) 10 MG tablet   Weight gain- subjective   Relevant Orders   TSH (Completed)    Other Visit Diagnoses    Cold intolerance       Relevant Orders   TSH (Completed)  T4, free (Completed)   CBC w/Diff (Completed)      Meds ordered this encounter  Medications  . amitriptyline (ELAVIL) 25 MG tablet    Sig: Take 2 tablets (50 mg total) by mouth at bedtime.    Dispense:  180 tablet    Refill:  0  . buPROPion (WELLBUTRIN XL) 150 MG 24 hr tablet    Sig: Take 1 tablet (150 mg total) by mouth daily.    Dispense:  90 tablet    Refill:  0  . cyclobenzaprine (FLEXERIL) 10 MG tablet    Sig: Take 1 tablet (10 mg total) by mouth 3 (three) times daily as needed for muscle spasms.    Dispense:  30 tablet    Refill:  0  . DULoxetine (CYMBALTA) 20 MG capsule    Sig: Take 2 capsules (40 mg total) by mouth daily.    Dispense:  180  capsule    Refill:  0  . levothyroxine (SYNTHROID) 175 MCG tablet    Sig: Take 1 tablet (175 mcg total) by mouth daily before breakfast.    Dispense:  90 tablet    Refill:  0  . meloxicam (MOBIC) 15 MG tablet    Sig: Take 1 tablet (15 mg total) by mouth daily.    Dispense:  90 tablet    Refill:  0  . omeprazole (PRILOSEC) 20 MG capsule    Sig: Take 1 capsule (20 mg total) by mouth in the morning and at bedtime.    Dispense:  180 capsule    Refill:  0  . spironolactone (ALDACTONE) 25 MG tablet    Sig: Take 1 tablet (25 mg total) by mouth daily.    Dispense:  90 tablet    Refill:  0   HTN: - BP today is 138/87, stable. - Continue Amlodipine  - Encourage ambulatory BP and pulse monitoring. - Follow DASH diet.  HLD: - Last lipid panel wnl's with exception of elevated LDL. - Followed by cardiology.  - Encourage to continue heart healthy diet and stay as active as possible.  - Instructed to follow-up with cardiology sooner if chest pain worsens. Dr. Stanford Breed plans to do follow-up CTA 08/2019.  Mood- Adjustment disorder with mixed anxiety and depressed mood: - Stable, PHQ9 score of 0 - Continue current medication regimen- Cymbalta, Wellbutrin, Amitriptyline - Provided refills.    Hypothyroid:  - Last TSH and free T4 wnl's - Pt symptomatic so will recheck thyroid function. - Provided refills of Levothyroxine.  - Pending lab results will make dose adjustments if necessary.   Chronic pain syndrome, fibromyalgia:  - stable, provided refills of Flexeril, Cymbalta, Amitriptyline, Meloxicam  Chronic cough, COPD: - Advised to follow-up with pulmonology.   Fatigue, cold intolerance: - Ordered CBC and CMP to evaluate for possible etiologies such as anemia and electrolyte imbalances.   GERD: - Provided refill of Omeprazole.  Follow-up: Return in about 3 months (around 10/25/2019) for HTN, HLD, Hypothyroid, Mood .    Lorrene Reid, PA-C

## 2019-07-26 LAB — COMPREHENSIVE METABOLIC PANEL
ALT: 10 IU/L (ref 0–32)
AST: 21 IU/L (ref 0–40)
Albumin/Globulin Ratio: 2.1 (ref 1.2–2.2)
Albumin: 4.7 g/dL (ref 3.8–4.8)
Alkaline Phosphatase: 89 IU/L (ref 48–121)
BUN/Creatinine Ratio: 18 (ref 12–28)
BUN: 16 mg/dL (ref 8–27)
Bilirubin Total: <0.2 mg/dL (ref 0.0–1.2)
CO2: 25 mmol/L (ref 20–29)
Calcium: 9.6 mg/dL (ref 8.7–10.3)
Chloride: 104 mmol/L (ref 96–106)
Creatinine, Ser: 0.87 mg/dL (ref 0.57–1.00)
GFR calc Af Amer: 83 mL/min/{1.73_m2} (ref >59)
GFR calc non Af Amer: 72 mL/min/{1.73_m2} (ref >59)
Globulin, Total: 2.2 g/dL (ref 1.5–4.5)
Glucose: 74 mg/dL (ref 65–99)
Potassium: 4.8 mmol/L (ref 3.5–5.2)
Sodium: 141 mmol/L (ref 134–144)
Total Protein: 6.9 g/dL (ref 6.0–8.5)

## 2019-07-26 LAB — CBC WITH DIFFERENTIAL/PLATELET
Basophils Absolute: 0.1 10*3/uL (ref 0.0–0.2)
Basos: 1 %
EOS (ABSOLUTE): 0.2 10*3/uL (ref 0.0–0.4)
Eos: 3 %
Hematocrit: 41.7 % (ref 34.0–46.6)
Hemoglobin: 14.2 g/dL (ref 11.1–15.9)
Immature Grans (Abs): 0 10*3/uL (ref 0.0–0.1)
Immature Granulocytes: 0 %
Lymphocytes Absolute: 2.7 10*3/uL (ref 0.7–3.1)
Lymphs: 33 %
MCH: 29.5 pg (ref 26.6–33.0)
MCHC: 34.1 g/dL (ref 31.5–35.7)
MCV: 87 fL (ref 79–97)
Monocytes Absolute: 0.5 10*3/uL (ref 0.1–0.9)
Monocytes: 6 %
Neutrophils Absolute: 4.7 10*3/uL (ref 1.4–7.0)
Neutrophils: 57 %
Platelets: 301 10*3/uL (ref 150–450)
RBC: 4.81 x10E6/uL (ref 3.77–5.28)
RDW: 13 % (ref 11.7–15.4)
WBC: 8.2 10*3/uL (ref 3.4–10.8)

## 2019-07-26 LAB — TSH: TSH: 1.96 u[IU]/mL (ref 0.450–4.500)

## 2019-07-26 LAB — T4, FREE: Free T4: 1.5 ng/dL (ref 0.82–1.77)

## 2019-07-31 ENCOUNTER — Other Ambulatory Visit: Payer: Self-pay

## 2019-07-31 ENCOUNTER — Encounter: Payer: Self-pay | Admitting: Pulmonary Disease

## 2019-07-31 ENCOUNTER — Ambulatory Visit (INDEPENDENT_AMBULATORY_CARE_PROVIDER_SITE_OTHER): Payer: Medicare HMO

## 2019-07-31 ENCOUNTER — Ambulatory Visit: Payer: Medicare HMO | Admitting: Pulmonary Disease

## 2019-07-31 VITALS — BP 124/80 | HR 92 | Temp 98.1°F | Ht 65.0 in | Wt 194.6 lb

## 2019-07-31 DIAGNOSIS — R05 Cough: Secondary | ICD-10-CM | POA: Diagnosis not present

## 2019-07-31 DIAGNOSIS — J449 Chronic obstructive pulmonary disease, unspecified: Secondary | ICD-10-CM

## 2019-07-31 DIAGNOSIS — J302 Other seasonal allergic rhinitis: Secondary | ICD-10-CM | POA: Diagnosis not present

## 2019-07-31 DIAGNOSIS — J3089 Other allergic rhinitis: Secondary | ICD-10-CM | POA: Diagnosis not present

## 2019-07-31 DIAGNOSIS — R059 Cough, unspecified: Secondary | ICD-10-CM

## 2019-07-31 MED ORDER — AZITHROMYCIN 250 MG PO TABS: ORAL_TABLET | ORAL | 0 refills | Status: DC

## 2019-07-31 MED ORDER — MONTELUKAST SODIUM 10 MG PO TABS: 10.0000 mg | ORAL_TABLET | Freq: Every day | ORAL | 0 refills | Status: DC

## 2019-07-31 NOTE — Assessment & Plan Note (Signed)
Acute on chronic exacerbation of cough  Plan: Sputum cultures three-way's today Chest x-ray today Z-Pak today May need to consider flutter valve, patient declined at this point in time due to out-of-pocket cost

## 2019-07-31 NOTE — Assessment & Plan Note (Signed)
Plan: Continue Allegra Continue Flonase Continue nasal saline rinses twice daily Trial of Singulair Reviewed with patient that if palpitations recur again to stop Singulair notify our office Patient to let us know how she tolerates Singulair

## 2019-07-31 NOTE — Assessment & Plan Note (Signed)
Allergic reaction to Anoro Ellipta Previously failed Spiriva and Incruse Ellipta Moderate obstructive airways disease on 2016 pulmonary function testing Increased cough and congestion over the last 2 months as well as with dyspnea Breath sounds clear to auscultation today  Plan: Sputum cultures today 3 ways  Azithromycin today Chest x-ray today Close follow-up with our office with an appointment with Dr. Annamaria Boots where we can consider trialing maintenance inhalers

## 2019-07-31 NOTE — Progress Notes (Signed)
@Patient  ID: Amy Hodge, female    DOB: 03/19/1955, 64 y.o.   MRN: [id number]  Chief Complaint  Patient presents with  . Follow-up    Cough    Referring provider: Lorrene Reid, PA-C  HPI:  64 year old female former smoker followed in our office for COPD, hemoptysis, mild obstructive sleep apnea  PMH: Hyperlipidemia, fatigue, vitamin D deficiency, anxiety, GERD, back pain, chest pain, CAD, history of MI, hypertension Smoker/ Smoking History: Former smoker.  50-pack-year smoking history.  Quit 1998. Maintenance: None Pt of: Dr. Annamaria Boots  07/31/2019  - Visit   64 year old female former smoker upon our office for COPD.  Patient presenting to office today as an acute visit for worsening cough as well as increased shortness of breath.  Patient was last seen in our office in August/2020 was felt to potentially have a COPD exacerbation although not typical.  Encouraged to remain hydrated, Tussionex prescription was provided, repeat Covid test as well as to present to the emergency room if symptoms are worsening Covid test was negative at that time.  Chest x-ray at that time showed scattered areas of scarring.  64 year old female former smoker presenting to office today.  She is reporting she is had worsening cough and congestion and shortness of breath over the last 2 months.  She has noticed that she is producing more mucus than usual.  Sputum color is also changed to a brown to yellow mucus.  She also feels that this is thicker.  She is currently not using a flutter valve.  Mucus production is worse in the mornings.  She also struggles with sputum production at night.  She denies any acid reflux symptoms.  She is not on any maintenance inhalers.  She denies acid reflux symptoms.  She is currently using her husband's albuterol rescue inhaler 2-3 times a day.  She has previously been tried on Singulair and she felt clinical improvement with this but it was stopped due to heart palpitations.   Patient reporting today that she is unsure if it was truly the Singulair that was causing the heart palpitations.  She is interested in resuming Singulair and seeing if this helps with her breathing.  She is formal allergy work-up as a child which she reports she was "allergic to everything".  Questionaires / Pulmonary Flowsheets:    MMRC: mMRC Dyspnea Scale mMRC Score  07/31/2019 3    Tests:   a1AT WNL MM 138 6MWT-06/23/2014-94%, 93%, 99%, 452 m on room air. No oxygen limitation. PFT-06/23/14- moderate obstructive airways disease with insignificant response to bronchodilator, normal lung capacity, diffusion mildly reduced. FVC 2.64/70%, FEV1 1.92/65%, FEV1/FVC 0.72 ONOX-04/30/14- 314 minutes with oxygen saturation less than or equal to 88% on room air, qualifying for home O2 during sleep ECHO 05/16/13 EF 60-65%, GR1 diast dysfunction HST- 07/16/15- AHI 6/ hr, desaturation to 82%, body weight 186 lbs  02/23/2017-CT chest lung cancer screening-lung RADS 2S, benign appearance, tiny calcified noncalcified pulmonary nodules are noted, no and larger or more suspicious than pulmonary nodules or masses noted.  Diffuse bronchial wall thickening with mild centrilobular and paraseptal emphysema   FENO:  No results found for: NITRICOXIDE  PFT: PFT Results Latest Ref Rng & Units 06/23/2014  FVC-Pre L 2.72  FVC-Predicted Pre % 72  FVC-Post L 2.64  FVC-Predicted Post % 70  Pre FEV1/FVC % % 72  Post FEV1/FCV % % 72  FEV1-Pre L 1.96  FEV1-Predicted Pre % 66  FEV1-Post L 1.92  DLCO UNC% % 69  DLCO COR %Predicted % 75  TLC L 5.41  TLC % Predicted % 98  RV % Predicted % 122    WALK:  SIX MIN WALK 06/23/2014  Medications Amlodipine 2.5mg , Plavix 75mg , Cymbalta 60mg , metoprolol 25mg , Aldactone 25mg - all taken at approx. 7:30 this morning.   Supplimental Oxygen during Test? (L/min) No  Laps 9  Partial Lap (in Meters) 20  Baseline BP (sitting) 132/68  Baseline Heartrate 63  Baseline Dyspnea  (Borg Scale) 2  Baseline Fatigue (Borg Scale) 7  Baseline SPO2 94  BP (sitting) 142/76  Heartrate 73  Dyspnea (Borg Scale) 7  Fatigue (Borg Scale) 7  SPO2 93  BP (sitting) 134/72  Heartrate 66  SPO2 99  Stopped or Paused before Six Minutes No  Interpretation Angina;Calf pain  Distance Completed 452  Tech Comments: Pt tolerated walk well.     Imaging: DG Bone Density  Result Date: 07/09/2019 EXAM: DUAL X-RAY ABSORPTIOMETRY (DXA) FOR BONE MINERAL DENSITY IMPRESSION: Referring Physician:  Mellody Dance Your patient completed a BMD test using Lunar IDXA DXA system ( analysis version: 16 ) manufactured by [employer]. Technologist: AW PATIENT: Name: Amy, Hodge Patient ID: [id number] Birth Date: Sep 26, 1955 Height: 65.3 in. Sex: Female Measured: 07/09/2019 Weight: 197.0 lbs. Indications: Albuterol, Bilateral Ovariectomy (65.51), Caucasian, COPD, Estrogen Deficient, Hysterectomy, Postmenopausal, Synthroid Fractures: None Treatments: Vitamin D (E933.5) ASSESSMENT: The BMD measured at Femur Neck Left is 0.902 g/cm2 with a T-score of -1.0. This patient is considered normal according to [facility]) criteria. The scan quality is good. L-2 was excluded due to degenerative changes. Site Region Measured Date Measured Age YA BMD Significant CHANGE T-score DualFemur Neck Left  07/09/2019    61.6         -1.0    0.902 g/cm2 DualFemur Neck Left  03/21/2017    59.3         -1.1    0.888 g/cm2 AP Spine  L1-L4 (L2) 07[age]09/2019    61.6         0.4     1.234 g/cm2 AP Spine  L1-L4 (L2) 03/21/2017    59.3         0.2     1.210 g/cm2 DualFemur Total Mean 07/09/2019    61.6         -0.4    0.957 g/cm2 DualFemur Total Mean 03/21/2017    59.3         -0.5    0.950 g/cm2 World Health Organization [facility]) criteria for post-menopausal, Caucasian Women: Normal       T-score at or above -1 SD Osteopenia   T-score between -1 and -2.5 SD Osteoporosis T-score at or below -2.5 SD RECOMMENDATION: 1. All  patients should optimize calcium and vitamin D intake. 2. Consider FDA approved medical therapies in postmenopausal women and men aged 25 years and older, based on the following: a. A hip or vertebral (clinical or morphometric) fracture b. T- score < or = -2.5 at the femoral neck or spine after appropriate evaluation to exclude secondary causes c. Low bone mass (T-score between -1.0 and -2.5 at the femoral neck or spine) and a 10 year probability of a hip fracture > or = 3% or a 10 year probability of a major osteoporosis-related fracture > or = 20% based on the [place]-adapted WHO algorithm d. Clinician judgment and/or patient preferences may indicate treatment for people with 10-year fracture probabilities above or below these levels FOLLOW-UP: Patients with diagnosis of osteoporosis or at high  risk for fracture should have regular bone mineral density tests. For patients eligible for Medicare, routine testing is allowed once every 2 years. The testing frequency can be increased to one year for patients who have rapidly progressing disease, those who are receiving or discontinuing medical therapy to restore bone mass, or have additional risk factors. Electronically Signed   By: Marlaine Hind M.D.   On: 07/09/2019 08:46   MM 3D SCREEN BREAST BILATERAL  Result Date: 07/09/2019 CLINICAL DATA:  Screening. EXAM: DIGITAL SCREENING BILATERAL MAMMOGRAM WITH TOMO AND CAD COMPARISON:  Previous exam(s). ACR Breast Density Category c: The breast tissue is heterogeneously dense, which may obscure small masses. FINDINGS: There are no findings suspicious for malignancy. Images were processed with CAD. IMPRESSION: No mammographic evidence of malignancy. A result letter of this screening mammogram will be mailed directly to the patient. RECOMMENDATION: Screening mammogram in one year. (Code:[id number]-B-01Y) BI-RADS CATEGORY  1: Negative. Electronically Signed   By: Margarette Canada M.D.   On: 07/09/2019 08:54    Lab Results:  CBC      Component Value Date/Time   WBC 8.2 07/25/2019 1535   WBC 7.9 06/28/2016 1201   RBC 4.81 07/25/2019 1535   RBC 5.11 06/28/2016 1201   HGB 14.2 07/25/2019 1535   HCT 41.7 07/25/2019 1535   PLT 301 07/25/2019 1535   MCV 87 07/25/2019 1535   MCH 29.5 07/25/2019 1535   MCH 28.6 06/28/2016 1201   MCHC 34.1 07/25/2019 1535   MCHC 33.2 06/28/2016 1201   RDW 13.0 07/25/2019 1535   LYMPHSABS 2.7 07/25/2019 1535   MONOABS 0.5 06/28/2016 1201   EOSABS 0.2 07/25/2019 1535   BASOSABS 0.1 07/25/2019 1535    BMET    Component Value Date/Time   NA 141 07/25/2019 1535   K 4.8 07/25/2019 1535   CL 104 07/25/2019 1535   CO2 25 07/25/2019 1535   GLUCOSE 74 07/25/2019 1535   GLUCOSE 84 06/28/2016 1201   BUN 16 07/25/2019 1535   CREATININE 0.87 07/25/2019 1535   CREATININE 0.79 12/11/2015 1242   CALCIUM 9.6 07/25/2019 1535   GFRNONAA 72 07/25/2019 1535   GFRAA 83 07/25/2019 1535    BNP    Component Value Date/Time   BNP 24.1 06/28/2016 1201    ProBNP No results found for: PROBNP  Specialty Problems      Pulmonary Problems   COPD mixed type (HCC)    a1AT WNL MM 138 6MWT-06/23/2014-94%, 93%, 99%, 452 m on room air. No oxygen limitation. PFT-06/23/14- moderate obstructive airways disease with insignificant response to bronchodilator, normal lung capacity, diffusion mildly reduced. FVC 2.64/70%, FEV1 1.92/65%, FEV1/FVC 0.72  12/04/17>>> therapeutic trial of Anoro Ellipta, patient had previously failed Spiriva and incruse      Seasonal and perennial allergic rhinitis    Qualifier: Diagnosis of  By: Jenny Reichmann MD, Hunt Oris       COPD exacerbation ([place])   Centrilobular emphysema (HCC)    a1AT WNL MM 138 6MWT-06/23/2014-94%, 93%, 99%, 452 m on room air. No oxygen limitation. PFT-06/23/14- moderate obstructive airways disease with insignificant response to bronchodilator, normal lung capacity, diffusion mildly reduced. FVC 2.64/70%, FEV1 1.92/65%, FEV1/FVC 0.72 ONOX-04/30/14- 314 minutes  with oxygen saturation less than or equal to 88% on room air, qualifying for home O2 during sleep      Hemoptysis    Reported 10/23/2014      Nocturnal hypoxemia    On chronic 2.5 L [place] at night only      Obstructive  sleep apnea    Very minimal, AHI barely above the upper limit of normal at 6.0/hour Unattended Home Sleep Test 07/16/2015-AHI 6.0/hour, average saturation 90%, weight 186 pounds      Cough   COPD with acute exacerbation (HCC)   Lung nodule: benign-appearing, screening CT each December   Allergic Rhinosinusitis   Chronic cough due to COPD   OSA and COPD overlap syndrome (HCC)      Allergies  Allergen Reactions  . Bactrim [Sulfamethoxazole-Trimethoprim] Shortness Of Breath, Anxiety and Palpitations  . Ketorolac Shortness Of Breath  . Anoro Ellipta [Umeclidinium-Vilanterol]     Per patient, caused painful blisters in her mouth.   . Morphine Other (See Comments)    headache  . Tetracyclines & Related Itching  . Valsartan Other (See Comments) and Cough    Headaches also   . Bystolic [Nebivolol Hcl] Other (See Comments)    Fatigue- Tired feeling  . Isosorbide Other (See Comments)    HEADACHES   . Levofloxacin Hives  . Lisinopril Cough  . Metoprolol Other (See Comments)    FATIGUE  . Morphine And Related Other (See Comments)    headache  . Simvastatin Other (See Comments)    Makes legs hurt/ sore   . Singulair [Montelukast Sodium] Palpitations  . Statins Other (See Comments)    MYALGIAS  . Tetracycline Hives and Itching  . Toradol [Ketorolac Tromethamine] Palpitations    Immunization History  Administered Date(s) Administered  . Influenza Split 12/31/2010  . Influenza Whole 11/27/2008, 02/16/2010  . Influenza,inj,Quad PF,6+ Mos 12/30/2016, 12/26/2017, 12/21/2018  . Influenza-Unspecified 05/15/2013, 12/06/2014, 11/05/2015, 12/26/2017  . Pneumococcal Conjugate-13 02/15/2013  . Pneumococcal Polysaccharide-23 03/07/2012, 01/01/2018  . Td 03/07/2004  .  Tdap 02/26/2015    Past Medical History:  Diagnosis Date  . Allergy   . Anxiety   . COPD (chronic obstructive pulmonary disease) ([place])   . Coronary artery disease    a. s/p PCI of RCA in 2015 b. cath in 12/2015 showed 100% Ost RPDA with collaterals present  . DDD (degenerative disc disease), cervical   . Depression   . DJD (degenerative joint disease) of knee    RT  . GERD (gastroesophageal reflux disease)   . Hashimoto's disease   . Headache(784.0)   . Hyperlipidemia   . Hypertension   . Hypothyroidism   . Myocardial infarction ([place])   . Nocturnal hypoxemia 02/26/2015  . On supplemental oxygen therapy    Oxygen 2.5 l/m at bedtime.  . Stented coronary artery 02/26/2015    Tobacco History: Social History   Tobacco Use  Smoking Status Former Smoker  . Packs/day: 2.00  . Years: 25.00  . Pack years: 50.00  . Types: Cigarettes  . Quit date: 04/23/1996  . Years since quitting: 23.2  Smokeless Tobacco Never Used   Counseling given: Not Answered   Continue to not smoke  Outpatient Encounter Medications as of 07/31/2019  Medication Sig  . acetaminophen (TYLENOL) 500 MG tablet Take 1,000 mg by mouth every 8 (eight) hours as needed (for pain or headahces).   Marland Kitchen amitriptyline (ELAVIL) 25 MG tablet Take 2 tablets (50 mg total) by mouth at bedtime.  Marland Kitchen amLODipine (NORVASC) 2.5 MG tablet TAKE 1 TABLET EVERY DAY  . aspirin EC 81 MG tablet Take 81 mg by mouth daily.  Marland Kitchen azithromycin (ZITHROMAX) 250 MG tablet 500mg  (two tablets) today, then 250mg  (1 tablet) for the next 4 days  . B Complex Vitamins (VITAMIN B COMPLEX PO) Take 1 tablet by mouth  daily.  . buPROPion (WELLBUTRIN XL) 150 MG 24 hr tablet Take 1 tablet (150 mg total) by mouth daily.  . chlorpheniramine-HYDROcodone (TUSSIONEX PENNKINETIC ER) 10-8 MG/5ML SUER Take 5 mLs by mouth every 12 (twelve) hours as needed for cough.  . Cholecalciferol (VITAMIN D3) 5000 units CAPS Take 1 capsule by mouth daily.  . cyclobenzaprine  (FLEXERIL) 10 MG tablet Take 1 tablet (10 mg total) by mouth 3 (three) times daily as needed for muscle spasms.  . DULoxetine (CYMBALTA) 20 MG capsule Take 2 capsules (40 mg total) by mouth daily.  . Evolocumab (REPATHA SURECLICK) [id number] MG/ML SOAJ Inject 140 mg into the skin every 14 (fourteen) days.  . fexofenadine (ALLEGRA) 180 MG tablet Take 1 tablet (180 mg total) by mouth daily.  Marland Kitchen levothyroxine (SYNTHROID) 175 MCG tablet Take 1 tablet (175 mcg total) by mouth daily before breakfast.  . meloxicam (MOBIC) 15 MG tablet Take 1 tablet (15 mg total) by mouth daily.  . montelukast (SINGULAIR) 10 MG tablet Take 1 tablet (10 mg total) by mouth at bedtime.  . nitroGLYCERIN (NITROSTAT) 0.4 MG SL tablet Place 0.4 mg under the tongue every 5 (five) minutes as needed for chest pain.  Marland Kitchen omeprazole (PRILOSEC) 20 MG capsule Take 1 capsule (20 mg total) by mouth in the morning and at bedtime.  Marland Kitchen spironolactone (ALDACTONE) 25 MG tablet Take 1 tablet (25 mg total) by mouth daily.   No facility-administered encounter medications on file as of 07/31/2019.     Review of Systems  Review of Systems  Constitutional: Positive for fatigue. Negative for activity change and fever.  HENT: Positive for congestion, postnasal drip and rhinorrhea. Negative for sinus pressure, sinus pain and sore throat.   Respiratory: Positive for cough and shortness of breath. Negative for wheezing.   Cardiovascular: Negative for chest pain and palpitations.  Gastrointestinal: Negative for diarrhea, nausea and vomiting.  Musculoskeletal: Negative for arthralgias.  Neurological: Negative for dizziness.  Psychiatric/Behavioral: Negative for sleep disturbance. The patient is not nervous/anxious.      Physical Exam  BP 124/80 (BP Location: Left Arm, Cuff Size: Normal)   Pulse 92   Temp 98.1 F (36.7 C) (Oral)   Ht 5\' 5"  (1.651 m)   Wt 194 lb 9.6 oz (88.3 kg)   SpO2 96% Comment: RA  BMI 32.38 kg/m   Wt Readings from Last 5  Encounters:  07/31/19 194 lb 9.6 oz (88.3 kg)  07/25/19 197 lb 1.6 oz (89.4 kg)  04/30/19 197 lb (89.4 kg)  04/05/19 194 lb (88 kg)  01/25/19 189 lb 9.6 oz (86 kg)    BMI Readings from Last 5 Encounters:  07/31/19 32.38 kg/m  07/25/19 32.80 kg/m  04/30/19 31.32 kg/m  04/05/19 30.84 kg/m  01/25/19 30.14 kg/m     Physical Exam Vitals and nursing note reviewed.  Constitutional:      General: She is not in acute distress.    Appearance: Normal appearance.  HENT:     Head: Normocephalic and atraumatic.     Right Ear: Tympanic membrane, ear canal and external ear normal. There is no impacted cerumen.     Left Ear: Tympanic membrane, ear canal and external ear normal. There is no impacted cerumen.     Nose: Mucosal edema, congestion and rhinorrhea present.     Right Nostril: No epistaxis.     Left Nostril: Occlusion present. No epistaxis.     Mouth/Throat:     Mouth: Mucous membranes are moist.     Pharynx:  Oropharynx is clear.  Eyes:     Pupils: Pupils are equal, round, and reactive to light.  Cardiovascular:     Rate and Rhythm: Normal rate and regular rhythm.     Pulses: Normal pulses.     Heart sounds: Normal heart sounds. No murmur.  Pulmonary:     Effort: Pulmonary effort is normal. No respiratory distress.     Breath sounds: Normal breath sounds. No decreased air movement. No decreased breath sounds, wheezing or rales.  Musculoskeletal:     Cervical back: Normal range of motion.  Skin:    General: Skin is warm and dry.     Capillary Refill: Capillary refill takes less than 2 seconds.  Neurological:     General: No focal deficit present.     Mental Status: She is alert and oriented to person, place, and time. Mental status is at baseline.     Gait: Gait normal.  Psychiatric:        Mood and Affect: Mood normal.        Behavior: Behavior normal.        Thought Content: Thought content normal.        Judgment: Judgment normal.       Assessment & Plan:    COPD mixed type (HCC) Allergic reaction to Anoro Ellipta Previously failed Spiriva and Incruse Ellipta Moderate obstructive airways disease on 2016 pulmonary function testing Increased cough and congestion over the last 2 months as well as with dyspnea Breath sounds clear to auscultation today  Plan: Sputum cultures today 3 ways  Azithromycin today Chest x-ray today Close follow-up with our office with an appointment with Dr. Annamaria Boots where we can consider trialing maintenance inhalers   Cough Acute on chronic exacerbation of cough  Plan: Sputum cultures three-way's today Chest x-ray today Z-Pak today May need to consider flutter valve, patient declined at this point in time due to out-of-pocket cost  Seasonal and perennial allergic rhinitis Plan: Continue Allegra Continue Flonase Continue nasal saline rinses twice daily Trial of Singulair Reviewed with patient that if palpitations recur again to stop Singulair notify our office Patient to let us know how she tolerates Singulair    Return in about 3 months (around 10/31/2019), or if symptoms worsen or fail to improve, for Follow up with Dr. Annamaria Boots.   Lauraine Rinne, NP 07/31/2019   This appointment required 32 minutes of patient care (this includes precharting, chart review, review of results, face-to-face care, etc.).

## 2019-07-31 NOTE — Patient Instructions (Addendum)
You were seen today by Amy Rinne, NP  for:   It was nice seeing you in clinic.  I am glad that you came in.  We will treat you with a course of antibiotics as well as obtain an x-ray.  Based off our discussion today you suggested you will consider trialing Singulair again as you felt clinical benefit from that in the past.  I will send in a 30-day prescription of this.  Please notify our office if you are tolerating this well so we can send a long-term prescription to your mail order pharmacy.  We are requesting sputum cultures today.  Please follow the instructions provided.  And please bring to our lab at: [facility] - in basement.  We will bring you back in to see Dr. Annamaria Boots in 8 to 12 weeks.  Take care of yourself and stay safe.  Let Amy Hodge know if your symptoms are not improving.  Amy Hodge  1. Cough  - azithromycin (ZITHROMAX) 250 MG tablet; 500mg  (two tablets) today, then 250mg  (1 tablet) for the next 4 days  Dispense: 6 tablet; Refill: 0 - montelukast (SINGULAIR) 10 MG tablet; Take 1 tablet (10 mg total) by mouth at bedtime.  Dispense: 30 tablet; Refill: 0 - DG Chest 2 View; Future - Respiratory or Resp and Sputum Culture; Future - Culture, fungus without smear; Future - AFB Culture & Smear; Future  2. COPD mixed type ([place])  - montelukast (SINGULAIR) 10 MG tablet; Take 1 tablet (10 mg total) by mouth at bedtime.  Dispense: 30 tablet; Refill: 0   We recommend today:  Orders Placed This Encounter  Procedures  . Respiratory or Resp and Sputum Culture    Standing Status:   Future    Standing Expiration Date:   07/30/2020  . Culture, fungus without smear    Standing Status:   Future    Standing Expiration Date:   07/30/2020  . AFB Culture & Smear    Standing Status:   Future    Standing Expiration Date:   07/30/2020  . DG Chest 2 View    Standing Status:   Future    Standing Expiration Date:   12/01/2019    Order Specific Question:   Reason for Exam (SYMPTOM  OR DIAGNOSIS REQUIRED)      Answer:   cough    Order Specific Question:   Preferred imaging location?    Answer:   Internal    Order Specific Question:   Radiology Contrast Protocol - do NOT remove file path    Answer:   [email or web address]   Orders Placed This Encounter  Procedures  . Respiratory or Resp and Sputum Culture  . Culture, fungus without smear  . AFB Culture & Smear  . DG Chest 2 View   Meds ordered this encounter  Medications  . azithromycin (ZITHROMAX) 250 MG tablet    Sig: 500mg  (two tablets) today, then 250mg  (1 tablet) for the next 4 days    Dispense:  6 tablet    Refill:  0  . montelukast (SINGULAIR) 10 MG tablet    Sig: Take 1 tablet (10 mg total) by mouth at bedtime.    Dispense:  30 tablet    Refill:  0    Follow Up:    Return in about 3 months (around 10/31/2019), or if symptoms worsen or fail to improve, for Follow up with Dr. Annamaria Boots.   Please do your part to reduce the spread of COVID-19:  Reduce your risk of any infection  and COVID19 by using the similar precautions used for avoiding the common cold or flu:  Marland Kitchen Wash your hands often with soap and warm water for at least 20 seconds.  If soap and water are not readily available, use an alcohol-based hand sanitizer with at least 60% alcohol.  . If coughing or sneezing, cover your mouth and nose by coughing or sneezing into the elbow areas of your shirt or coat, into a tissue or into your sleeve (not your hands). Langley Gauss A MASK when in public  . Avoid shaking hands with others and consider head nods or verbal greetings only. . Avoid touching your eyes, nose, or mouth with unwashed hands.  . Avoid close contact with people who are sick. . Avoid places or events with large numbers of people in one location, like concerts or sporting events. . If you have some symptoms but not all symptoms, continue to monitor at home and seek medical attention if your symptoms worsen. . If you are  having a medical emergency, call 911.   [place] / e-Visit: [email or web address]         [facility]: [facility]: [id number]                   [facility]: [id number]     It is flu season:   >>> Best ways to protect herself from the flu: Receive the yearly flu vaccine, practice good hand hygiene washing with soap and also using hand sanitizer when available, eat a nutritious meals, get adequate rest, hydrate appropriately   Please contact the office if your symptoms worsen or you have concerns that you are not improving.   Thank you for choosing [facility] for your healthcare, and for allowing Amy Hodge to partner with you on your healthcare journey. I am thankful to be able to provide care to you today.   Wyn Quaker FNP-C

## 2019-08-06 ENCOUNTER — Encounter: Payer: Self-pay | Admitting: *Deleted

## 2019-08-06 DIAGNOSIS — I712 Thoracic aortic aneurysm, without rupture, unspecified: Secondary | ICD-10-CM

## 2019-08-06 NOTE — Telephone Encounter (Signed)
Patient needs scheduled follow-up with Dr. Annamaria Boots.  Patient should be tried on inhaler therapies prior to being considered for Daliresp.  There is not a generic for Daliresp.  Okay to stop Singulair.  Schedule with Dr. Annamaria Boots at next available for further evaluation and follow-up.  Wyn Quaker, FNP

## 2019-08-06 NOTE — Telephone Encounter (Signed)
Brian, please advise. 

## 2019-08-15 ENCOUNTER — Other Ambulatory Visit: Payer: Self-pay | Admitting: Cardiology

## 2019-08-15 DIAGNOSIS — Z0100 Encounter for examination of eyes and vision without abnormal findings: Secondary | ICD-10-CM | POA: Diagnosis not present

## 2019-08-19 ENCOUNTER — Other Ambulatory Visit: Payer: Medicare HMO

## 2019-08-23 ENCOUNTER — Other Ambulatory Visit: Payer: Self-pay

## 2019-08-23 ENCOUNTER — Telehealth: Payer: Self-pay | Admitting: Cardiology

## 2019-08-23 ENCOUNTER — Encounter (HOSPITAL_COMMUNITY): Payer: Self-pay | Admitting: Emergency Medicine

## 2019-08-23 ENCOUNTER — Emergency Department (HOSPITAL_COMMUNITY): Payer: Medicare HMO

## 2019-08-23 ENCOUNTER — Emergency Department (HOSPITAL_COMMUNITY)
Admission: EM | Admit: 2019-08-23 | Discharge: 2019-08-23 | Disposition: A | Discharge status: Home / Self Care | Payer: Medicare HMO | Attending: Emergency Medicine | Admitting: Emergency Medicine

## 2019-08-23 DIAGNOSIS — R079 Chest pain, unspecified: Secondary | ICD-10-CM | POA: Diagnosis not present

## 2019-08-23 DIAGNOSIS — I6523 Occlusion and stenosis of bilateral carotid arteries: Secondary | ICD-10-CM | POA: Diagnosis not present

## 2019-08-23 DIAGNOSIS — R519 Headache, unspecified: Secondary | ICD-10-CM | POA: Diagnosis not present

## 2019-08-23 DIAGNOSIS — R0789 Other chest pain: Secondary | ICD-10-CM | POA: Diagnosis not present

## 2019-08-23 DIAGNOSIS — R0689 Other abnormalities of breathing: Secondary | ICD-10-CM | POA: Diagnosis not present

## 2019-08-23 DIAGNOSIS — I639 Cerebral infarction, unspecified: Secondary | ICD-10-CM | POA: Diagnosis not present

## 2019-08-23 DIAGNOSIS — R42 Dizziness and giddiness: Secondary | ICD-10-CM | POA: Diagnosis not present

## 2019-08-23 DIAGNOSIS — D320 Benign neoplasm of cerebral meninges: Secondary | ICD-10-CM | POA: Diagnosis not present

## 2019-08-23 DIAGNOSIS — Z79899 Other long term (current) drug therapy: Secondary | ICD-10-CM | POA: Diagnosis not present

## 2019-08-23 DIAGNOSIS — R202 Paresthesia of skin: Secondary | ICD-10-CM | POA: Insufficient documentation

## 2019-08-23 DIAGNOSIS — R531 Weakness: Secondary | ICD-10-CM | POA: Diagnosis not present

## 2019-08-23 LAB — COMPREHENSIVE METABOLIC PANEL
ALT: 12 U/L (ref 0–44)
AST: 22 U/L (ref 15–41)
Albumin: 4.3 g/dL (ref 3.5–5.0)
Alkaline Phosphatase: 79 U/L (ref 38–126)
Anion gap: 10 (ref 5–15)
BUN: 14 mg/dL (ref 8–23)
CO2: 25 mmol/L (ref 22–32)
Calcium: 9.2 mg/dL (ref 8.9–10.3)
Chloride: 101 mmol/L (ref 98–111)
Creatinine, Ser: 0.85 mg/dL (ref 0.44–1.00)
GFR calc Af Amer: >60 mL/min (ref >60)
GFR calc non Af Amer: >60 mL/min (ref >60)
Glucose, Bld: 94 mg/dL (ref 70–99)
Potassium: 4.7 mmol/L (ref 3.5–5.1)
Sodium: 136 mmol/L (ref 135–145)
Total Bilirubin: 0.6 mg/dL (ref 0.3–1.2)
Total Protein: 7.4 g/dL (ref 6.5–8.1)

## 2019-08-23 LAB — I-STAT CHEM 8, ED
BUN: 15 mg/dL (ref 8–23)
Calcium, Ion: 1.16 mmol/L (ref 1.15–1.40)
Chloride: 101 mmol/L (ref 98–111)
Creatinine, Ser: 0.9 mg/dL (ref 0.44–1.00)
Glucose, Bld: 90 mg/dL (ref 70–99)
HCT: 41 % (ref 36.0–46.0)
Hemoglobin: 13.9 g/dL (ref 12.0–15.0)
Potassium: 4.3 mmol/L (ref 3.5–5.1)
Sodium: 138 mmol/L (ref 135–145)
TCO2: 27 mmol/L (ref 22–32)

## 2019-08-23 LAB — CBC
HCT: 43.2 % (ref 36.0–46.0)
Hemoglobin: 14.2 g/dL (ref 12.0–15.0)
MCH: 29.7 pg (ref 26.0–34.0)
MCHC: 32.9 g/dL (ref 30.0–36.0)
MCV: 90.4 fL (ref 80.0–100.0)
Platelets: 279 10*3/uL (ref 150–400)
RBC: 4.78 MIL/uL (ref 3.87–5.11)
RDW: 12.6 % (ref 11.5–15.5)
WBC: 7.8 10*3/uL (ref 4.0–10.5)
nRBC: 0 % (ref 0.0–0.2)

## 2019-08-23 LAB — URINALYSIS, ROUTINE W REFLEX MICROSCOPIC
Bilirubin Urine: NEGATIVE
Glucose, UA: NEGATIVE mg/dL
Hgb urine dipstick: NEGATIVE
Ketones, ur: NEGATIVE mg/dL
Leukocytes,Ua: NEGATIVE
Nitrite: NEGATIVE
Protein, ur: NEGATIVE mg/dL
Specific Gravity, Urine: 1.024 (ref 1.005–1.030)
pH: 7 (ref 5.0–8.0)

## 2019-08-23 LAB — DIFFERENTIAL
Abs Immature Granulocytes: 0.02 10*3/uL (ref 0.00–0.07)
Basophils Absolute: 0 10*3/uL (ref 0.0–0.1)
Basophils Relative: 1 %
Eosinophils Absolute: 0.2 10*3/uL (ref 0.0–0.5)
Eosinophils Relative: 2 %
Immature Granulocytes: 0 %
Lymphocytes Relative: 35 %
Lymphs Abs: 2.7 10*3/uL (ref 0.7–4.0)
Monocytes Absolute: 0.5 10*3/uL (ref 0.1–1.0)
Monocytes Relative: 7 %
Neutro Abs: 4.3 10*3/uL (ref 1.7–7.7)
Neutrophils Relative %: 55 %

## 2019-08-23 LAB — RAPID URINE DRUG SCREEN, HOSP PERFORMED
Amphetamines: NOT DETECTED
Barbiturates: NOT DETECTED
Benzodiazepines: NOT DETECTED
Cocaine: NOT DETECTED
Opiates: NOT DETECTED
Tetrahydrocannabinol: NOT DETECTED

## 2019-08-23 LAB — CBG MONITORING, ED: Glucose-Capillary: 87 mg/dL (ref 70–99)

## 2019-08-23 LAB — PROTIME-INR
INR: 1 (ref 0.8–1.2)
Prothrombin Time: 12.5 seconds (ref 11.4–15.2)

## 2019-08-23 LAB — ETHANOL: Alcohol, Ethyl (B): <10 mg/dL (ref <10)

## 2019-08-23 LAB — TROPONIN I (HIGH SENSITIVITY)
Troponin I (High Sensitivity): 3 ng/L (ref <18)
Troponin I (High Sensitivity): 4 ng/L (ref <18)

## 2019-08-23 LAB — APTT: aPTT: 33 seconds (ref 24–36)

## 2019-08-23 MED ORDER — IOHEXOL 350 MG/ML SOLN
75.0000 mL | Freq: Once | INTRAVENOUS | Status: AC | PRN
  Administered 2019-08-23: 75 mL via INTRAVENOUS

## 2019-08-23 NOTE — Discharge Instructions (Addendum)
Continue medications as previously prescribed.  Return to the emergency department if your symptoms significantly worsen or change.

## 2019-08-23 NOTE — ED Provider Notes (Signed)
Patient is a 64 year old female with history of coronary artery disease with stent presenting with complaints of dizziness, headache, followed by tightness in her chest.  She had 2 episodes of this this afternoon, the first occurring at approximately 1130, the second shortly thereafter.  Patient arrived here as a code stroke as she was reporting some numbness in her left arm.  Patient underwent the usual radiographic studies including CT, CTA, and MRI.  These showed calcified meningioma, however no other acute abnormality.  Patient's cardiac work-up shows an unchanged EKG and negative troponin x2.  I assumed care from Dr. Billy Fischer awaiting results of the second troponin.  Patient has been reassessed and is symptom-free.  Troponin x2 is negative and I doubt a cardiac etiology to her symptoms.  Patient is adamant about returning home.  She does understand that a cardiac etiology has not been completely ruled out and accepts the risks associated with not staying in the hospital.  She assures me she will return if her symptoms worsen or change.  I am uncertain as to what caused these 2 episodes, however there is the possibility of vertigo or complicated migraine.  She has been cleared by neurology.   Veryl Speak, MD 08/23/19 [phone]

## 2019-08-23 NOTE — ED Notes (Signed)
Code stroke cancelled at 1352

## 2019-08-23 NOTE — Code Documentation (Signed)
Pt arrived at [facility] at 1250 via EMS for complaint of HTN, chest pain and left arm sensory loss. Pt stated her symptoms began at 1030 AM.  A code stroke was called at 1307. Pt was taken to CT scan at 1307. Neurologist arrived at 1308, [place] 1310. Non-contrast CT head obtained, which was negative for hemorrhage. While IV access was being obtained for CTA, Pt's NIHSS increased from 1 for left sided sensory loss to 3 for L sensory loss and drift of both left arm and leg. CTA negative for LVO. Since pt was still within the treatment window for TPA, a STAT MRI was obtained. (We arrived at 1326). Per Neurologist, Pt was not a candidate for TPA as her MRI was negative for stroke. She was not a candidate for thrombectomy as LVO negative. Code Stroke cancelled at 13:52. Mykenzie RN notified.

## 2019-08-23 NOTE — Telephone Encounter (Signed)
Pt c/o BP issue: STAT if pt c/o blurred vision, one-sided weakness or slurred speech  1. What are your last 5 BP readings? 214/142 152/92  2. Are you having any other symptoms (ex. Dizziness, headache, blurred vision, passed out)? Headache  3. What is your BP issue? Patient states she had a pain in her head that eventually transferred down her left arm. The pain soon became a heavy feeling, which transpired into a numbness. Patient states paramedics reported that her BP was extremely elevated. She is requesting to see Dr. Stanford Breed / APP today. However, I made her aware we do not have anything available for today. Please call to assist.

## 2019-08-23 NOTE — ED Provider Notes (Signed)
[facility] Provider Note   CSN: [id number] Arrival date & time: 08/23/19  1240  An emergency department physician performed an initial assessment on this suspected stroke patient at 1307 (per report, I had not yet arrived).  History Chief Complaint  Patient presents with  . Code Stroke    [facility] is a 64 y.o. female.  HPI      Presents as Code Stroke Was feeling ok  Sharp pain in head and went to left arm, felt tingling and weak/heaviness Felt dizzy, like going to pass out Then arm felt heavy and pressure in the chest and pain radiating some to the neck Went to the fire department, checked BP was 214/146 Went to see dr and told to come here Came here and had the weakness of the left arm and code stroke called Began 1030AM per pt, 1130AM per husband  With associated chest pain, felt shortness of breath This felt worse than prior MI, different (last time was like drawing in jaw/sour lemon in jaw for last MI) this time more pressure on chest this time  No nausea, did have diaphoresis  SHx of aortic aneurysm, sees Dr. Stanford Breed, has had more frequent anginal symptoms. If cleaning or walking to the mailbox will have to stop because feels like worsening pressure and diaphoresis.  Now having some heart palpitations, pressure on chest.  Sometimes 2-3 times per day, but resting CP more frequent, before was stable would have with walking then go away.   Past Medical History:  Diagnosis Date  . Allergy   . Anxiety   . COPD (chronic obstructive pulmonary disease) ([place])   . Coronary artery disease    a. s/p PCI of RCA in 2015 b. cath in 12/2015 showed 100% Ost RPDA with collaterals present  . DDD (degenerative disc disease), cervical   . Depression   . DJD (degenerative joint disease) of knee    RT  . GERD (gastroesophageal reflux disease)   . Hashimoto's disease   . Headache(784.0)   . Hyperlipidemia   . Hypertension   .  Hypothyroidism   . Myocardial infarction ([place])   . Nocturnal hypoxemia 02/26/2015  . On supplemental oxygen therapy    Oxygen 2.5 l/m at bedtime.  . Stented coronary artery 02/26/2015    Patient Active Problem List   Diagnosis Date Noted  . Weight gain- subjective 04/05/2019  . Obesity, Class I, BMI 30-34.9 04/05/2019  . HTN, goal below 130/80 04/05/2019  . Hip pain, bilateral 01/25/2019  . OSA and COPD overlap syndrome ([place]) 07/19/2018  . Spasm of muscle of lower back 06/20/2018  . Dysuria  06/20/2018  . Drug intolerance- statin 03/21/2018  . Pure hypercholesterolemia 11/13/2017  . Generalized OA 11/13/2017  . Allergic Rhinosinusitis 11/13/2017  . Chronic cough due to COPD 11/13/2017  . Environmental and seasonal allergies-on Zyrtec, has reactive airway disease component seasonally 07/11/2017  . Lung nodule: benign-appearing, screening CT each December 07/11/2017  . Senile solar keratosis 02/06/2017  . COPD with acute exacerbation ([place]) 01/11/2017  . S/P total hysterectomy and bilateral salpingo-oophorectomy 12/30/2016  . Adjustment disorder with mixed anxiety and depressed mood 08/02/2016  . Myalgia/ generalized fibromyalgia 08/02/2016  . ACS (acute coronary syndrome) 06/28/2016  . Crescendo angina ([place]) 06/28/2016  . Insomnia secondary to anxiety 05/10/2016  . Cough 04/28/2016  . Obstructive sleep apnea 08/26/2015  . Shingles rash 08/04/2015  . Allergic reaction 06/27/2015  . History of hemoptysis 06/27/2015  . Stented coronary  artery 02/26/2015  . Nocturnal hypoxemia 02/26/2015  . Hemoptysis 10/24/2014  . Centrilobular emphysema ([place]) 06/24/2014  . Soft tissue injury of multiple sites/bilateral hands 05/16/2013  . Bite from dog 05/15/2013  . Chest pain 05/15/2013  . Coronary artery disease 04/08/2013  . COPD exacerbation ([place]) 12/31/2010  . Health care maintenance 12/25/2010  . MENOPAUSAL DISORDER 02/16/2010  . Vitamin D deficiency 10/07/2009  . Chronic pain  syndrome 02/20/2009  . COMMON MIGRAINE 02/20/2009  . OSTEOARTHRITIS, KNEE, RIGHT 02/20/2009  . BACK PAIN 11/27/2008  . Fatigue- chronic 07/15/2008  . Memory loss 07/15/2008  . PERIPHERAL EDEMA 07/15/2008  . KNEE PAIN, BILATERAL 09/05/2007  . Hyperlipidemia- intol to statins 06/14/2007  . Seasonal and perennial allergic rhinitis 06/14/2007  . COPD mixed type ([place]) 06/14/2007  . post-operative Hypothyroidism 10/06/2006  . Anxiety state 10/06/2006  . Depression 10/06/2006  . GERD 10/06/2006    Past Surgical History:  Procedure Laterality Date  . ABDOMINAL HYSTERECTOMY    . APPENDECTOMY  1971  . BUNIONECTOMY Right   . CARDIAC CATHETERIZATION N/A 09/02/2014   Procedure: Left Heart Cath and Coronary Angiography;  Surgeon: Adrian Prows, MD;  Location: [facility];  Service: Cardiovascular;  Laterality: N/A;  . CARDIAC CATHETERIZATION N/A 12/15/2015   Procedure: Left Heart Cath and Coronary Angiography;  Surgeon: Jettie Booze, MD;  Location: [facility];  Service: Cardiovascular;  Laterality: N/A;  . CERVICAL FUSION    . CESAREAN SECTION    . COLONOSCOPY WITH PROPOFOL N/A 02/24/2015   Procedure: COLONOSCOPY WITH PROPOFOL;  Surgeon: Garlan Fair, MD;  Location: [facility];  Service: Endoscopy;  Laterality: N/A;  . ESOPHAGOGASTRODUODENOSCOPY (EGD) WITH PROPOFOL N/A 02/24/2015   Procedure: ESOPHAGOGASTRODUODENOSCOPY (EGD) WITH PROPOFOL;  Surgeon: Garlan Fair, MD;  Location: [facility];  Service: Endoscopy;  Laterality: N/A;  . HAND SURGERY     due to trauma-dog bite.  . I & D EXTREMITY Bilateral 05/17/2013   Procedure: IRRIGATION AND DEBRIDEMENT EXTREMITY;  Surgeon: Roseanne Kaufman, MD;  Location: [facility];  Service: Orthopedics;  Laterality: Bilateral;  . LEFT HEART CATHETERIZATION WITH CORONARY ANGIOGRAM N/A 03/04/2014   Procedure: LEFT HEART CATHETERIZATION WITH CORONARY ANGIOGRAM;  Surgeon: Laverda Page, MD;  Location: [facility];  Service: Cardiovascular;   Laterality: N/A;  . OOPHORECTOMY    . SHOULDER SURGERY     RT  . THYROIDECTOMY  1996  . TONSILLECTOMY  1965  . TOTAL ABDOMINAL HYSTERECTOMY W/ BILATERAL SALPINGOOPHORECTOMY  01/2008   with cervical dysplasia  . TUBAL LIGATION  1984     OB History   No obstetric history on file.     Family History  Problem Relation Age of Onset  . Lung cancer Mother   . Hypertension Mother   . Stroke Mother   . Diabetes Mother   . Diabetes Father   . Hypertension Father   . Heart attack Father   . Asthma Father   . COPD Father   . Alcohol abuse Father   . Diabetes Sister   . Hypertension Sister     Social History   Tobacco Use  . Smoking status: Former Smoker    Packs/day: 2.00    Years: 25.00    Pack years: 50.00    Types: Cigarettes    Quit date: 04/23/1996    Years since quitting: 23.3  . Smokeless tobacco: Never Used  Vaping Use  . Vaping Use: Never used  Substance Use Topics  . Alcohol use: No  Alcohol/week: 0.0 standard drinks  . Drug use: No    Home Medications Prior to Admission medications   Medication Sig Start Date End Date Taking? Authorizing Provider  acetaminophen (TYLENOL) 500 MG tablet Take 1,000 mg by mouth every 8 (eight) hours as needed (for pain or headaches).    Yes [provider]  albuterol (VENTOLIN HFA) 108 (90 Base) MCG/ACT inhaler Inhale 1-2 puffs into the lungs every 6 (six) hours as needed for wheezing or shortness of breath.   Yes [provider]  amitriptyline (ELAVIL) 25 MG tablet Take 2 tablets (50 mg total) by mouth at bedtime. 07/25/19  Yes Abonza, Maritza, PA-C  amLODipine (NORVASC) 2.5 MG tablet TAKE 1 TABLET EVERY DAY Patient taking differently: Take 2.5 mg by mouth in the morning.  04/12/19  Yes Lelon Perla, MD  aspirin EC 81 MG tablet Take 81 mg by mouth daily at 6 PM.    Yes [provider]  B Complex Vitamins (VITAMIN B COMPLEX PO) Take 1 tablet by mouth daily.   Yes [provider]    buPROPion (WELLBUTRIN XL) 150 MG 24 hr tablet Take 1 tablet (150 mg total) by mouth daily. Patient taking differently: Take 150 mg by mouth in the morning.  07/25/19  Yes Abonza, Maritza, PA-C  Cholecalciferol (VITAMIN D3) 5000 units CAPS Take 5,000 Units by mouth daily.    Yes [provider]  cyclobenzaprine (FLEXERIL) 10 MG tablet Take 1 tablet (10 mg total) by mouth 3 (three) times daily as needed for muscle spasms. 07/25/19  Yes Abonza, Maritza, PA-C  DULoxetine (CYMBALTA) 20 MG capsule Take 2 capsules (40 mg total) by mouth daily. 07/25/19  Yes Abonza, Maritza, PA-C  fexofenadine (ALLEGRA) 180 MG tablet Take 1 tablet (180 mg total) by mouth daily. 11/13/17  Yes Opalski, Neoma Laming, DO  levothyroxine (SYNTHROID) 175 MCG tablet Take 1 tablet (175 mcg total) by mouth daily before breakfast. 07/25/19  Yes Abonza, Maritza, PA-C  meloxicam (MOBIC) 15 MG tablet Take 1 tablet (15 mg total) by mouth daily. 07/25/19  Yes Abonza, Maritza, PA-C  nitroGLYCERIN (NITROSTAT) 0.4 MG SL tablet Place 0.4 mg under the tongue every 5 (five) minutes as needed for chest pain.   Yes [provider]  omeprazole (PRILOSEC) 20 MG capsule Take 1 capsule (20 mg total) by mouth in the morning and at bedtime. 07/25/19  Yes Lorrene Reid, PA-C  spironolactone (ALDACTONE) 25 MG tablet Take 1 tablet (25 mg total) by mouth daily. 07/25/19  Yes Abonza, Maritza, PA-C  azithromycin (ZITHROMAX) 250 MG tablet 500mg  (two tablets) today, then 250mg  (1 tablet) for the next 4 days Patient not taking: Reported on 08/23/2019 07/31/19   Lauraine Rinne, NP  chlorpheniramine-HYDROcodone (TUSSIONEX PENNKINETIC ER) 10-8 MG/5ML SUER Take 5 mLs by mouth every 12 (twelve) hours as needed for cough. Patient not taking: Reported on 08/23/2019 10/25/18   Deneise Lever, MD  Evolocumab (REPATHA SURECLICK) 888 MG/ML SOAJ Inject 140 mg into the skin every 14 (fourteen) days. Patient not taking: Reported on 08/23/2019 04/30/19   Lelon Perla,  MD  montelukast (SINGULAIR) 10 MG tablet Take 1 tablet (10 mg total) by mouth at bedtime. Patient not taking: Reported on 08/23/2019 07/31/19   Lauraine Rinne, NP    Allergies    Bactrim [sulfamethoxazole-trimethoprim], Ketorolac, Anoro ellipta [umeclidinium-vilanterol], Morphine, Tetracyclines & related, Valsartan, Bystolic [nebivolol hcl], Isosorbide, Levofloxacin, Lisinopril, Metoprolol, Morphine and related, Simvastatin, Singulair [montelukast sodium], Statins, Tetracycline, and Toradol [ketorolac tromethamine]  Review of Systems  Review of Systems  Constitutional: Positive for diaphoresis. Negative for fever.  HENT: Negative for sore throat.   Eyes: Negative for visual disturbance.  Respiratory: Positive for shortness of breath. Negative for cough.   Cardiovascular: Positive for chest pain.  Gastrointestinal: Negative for abdominal pain, nausea and vomiting.  Genitourinary: Negative for difficulty urinating.  Musculoskeletal: Negative for back pain and neck pain.  Skin: Negative for rash.  Neurological: Positive for weakness and numbness. Negative for syncope and headaches.    Physical Exam Updated Vital Signs BP (!) 151/95   Pulse (!) 53   Temp 98.2 F (36.8 C) (Oral)   Resp 18   Ht 5\' 5"  (1.651 m)   Wt 87.5 kg   SpO2 92%   BMI 32.12 kg/m   Physical Exam Vitals and nursing note reviewed.  Constitutional:      General: She is not in acute distress.    Appearance: Normal appearance. She is well-developed. She is not ill-appearing or diaphoretic.  HENT:     Head: Normocephalic and atraumatic.  Eyes:     General: No visual field deficit.    Extraocular Movements: Extraocular movements intact.     Conjunctiva/sclera: Conjunctivae normal.     Pupils: Pupils are equal, round, and reactive to light.  Cardiovascular:     Rate and Rhythm: Normal rate and regular rhythm.     Pulses: Normal pulses.     Heart sounds: Normal heart sounds. No murmur heard.  No friction rub. No  gallop.   Pulmonary:     Effort: Pulmonary effort is normal. No respiratory distress.     Breath sounds: Normal breath sounds. No wheezing or rales.  Abdominal:     General: There is no distension.     Palpations: Abdomen is soft.     Tenderness: There is no abdominal tenderness. There is no guarding.  Musculoskeletal:        General: No swelling or tenderness.     Cervical back: Normal range of motion.  Skin:    General: Skin is warm and dry.     Findings: No erythema or rash.  Neurological:     General: No focal deficit present.     Mental Status: She is alert and oriented to person, place, and time.     GCS: GCS eye subscore is 4. GCS verbal subscore is 5. GCS motor subscore is 6.     Cranial Nerves: Cranial nerves are intact. No cranial nerve deficit, dysarthria or facial asymmetry.     Sensory: Sensation is intact. No sensory deficit.     Motor: No weakness or tremor.     Coordination: Coordination normal. Finger-Nose-Finger Test normal.     Gait: Gait normal.     Comments: Initial exam with left arm weakness, improved on recheck     ED Results / Procedures / Treatments   Labs (all labs ordered are listed, but only abnormal results are displayed) Labs Reviewed  ETHANOL  PROTIME-INR  APTT  CBC  DIFFERENTIAL  COMPREHENSIVE METABOLIC PANEL  RAPID URINE DRUG SCREEN, HOSP PERFORMED  URINALYSIS, ROUTINE W REFLEX MICROSCOPIC  I-STAT CHEM 8, ED  CBG MONITORING, ED  TROPONIN I (HIGH SENSITIVITY)    EKG EKG Interpretation  Date/Time:  Friday August 23 2019 12:43:15 EDT Ventricular Rate:  86 PR Interval:  162 QRS Duration: 92 QT Interval:  364 QTC Calculation: 435 R Axis:   66 Text Interpretation: Normal sinus rhythm Normal ECG No significant change since last tracing Confirmed by Billy Fischer,  Junie Panning [phone]) on 08/23/2019 3:35:00 PM   Radiology DG Chest 2 View  Result Date: 08/23/2019 CLINICAL DATA:  Sudden onset headache and chest pain. Left-sided weakness. Code  stroke. EXAM: CHEST - 2 VIEW COMPARISON:  Radiographs 07/31/2019 and 10/25/2018. FINDINGS: The heart size and mediastinal contours are stable. There is stable linear scarring in both lungs. The lungs are otherwise clear. There is no pleural effusion or pneumothorax. Thyroidectomy clips and lower cervical fusion are noted. No acute osseous findings. IMPRESSION: Stable chest. No active cardiopulmonary process. Electronically Signed   By: Richardean Sale M.D.   On: 08/23/2019 15:59   MR BRAIN WO CONTRAST  Result Date: 08/23/2019 CLINICAL DATA:  Acute neuro deficit. Rule out stroke. Blurred vision and left-sided weakness. EXAM: MRI HEAD WITHOUT CONTRAST TECHNIQUE: Multiplanar, multiecho pulse sequences of the brain and surrounding structures were obtained without intravenous contrast. COMPARISON:  CT head 08/23/2019.  MRI head 07/18/2008 FINDINGS: Brain: Ventricle size and cerebral volume normal. Negative for acute infarct. Few small deep white matter hyperintensities bilaterally and in the right pons most compatible with chronic microvascular ischemia. Negative for hemorrhage Calcified extra-axial mass left frontal lobe over the convexity. This measures approximately 20 mm in diameter and shows mild interval growth since 2010. No edema in the adjacent brain. Vascular: Normal arterial flow voids. Skull and upper cervical spine: Normal bone marrow. Sinuses/Orbits: Negative Other: None IMPRESSION: Negative for acute infarct Mild chronic microvascular ischemic change in the white matter and pons 20 mm calcified meningioma left frontal lobe without adjacent brain edema. Mild interval growth since 2010 Electronically Signed   By: Franchot Gallo M.D.   On: 08/23/2019 14:11   CT HEAD CODE STROKE WO CONTRAST  Result Date: 08/23/2019 CLINICAL DATA:  Code stroke.  Left-sided weakness EXAM: CT HEAD WITHOUT CONTRAST TECHNIQUE: Contiguous axial images were obtained from the base of the skull through the vertex without  intravenous contrast. COMPARISON:  MRI head 07/19/2018 FINDINGS: Brain: Ventricle size and cerebral volume normal. Negative for acute infarct. Negative for hemorrhage. 19 mm calcified mass left frontal lobe compatible with meningioma. This has slightly enlarged since 2010 when it measured 17 mm.No edema in the brain Vascular: Vessels are mildly hyperdense diffusely without focal thrombosis. Mild calcification in the carotid and basilar arteries. Skull: Negative Sinuses/Orbits: Paranasal sinuses clear.  Normal orbit bilaterally. Other: None ASPECTS ([place] Stroke Program Early CT Score) - Ganglionic level infarction (caudate, lentiform nuclei, internal capsule, insula, M1-M3 cortex): 7 - Supraganglionic infarction (M4-M6 cortex): 3 Total score (0-10 with 10 being normal): 10 IMPRESSION: 1. Negative for acute infarct or hemorrhage 2. ASPECTS is 10 3. 19 mm calcified meningioma left frontal lobe with mild growth since 2010. 4. Results texted to Dr. Rory Percy Electronically Signed   By: Franchot Gallo M.D.   On: 08/23/2019 13:23   CT ANGIO HEAD CODE STROKE  Result Date: 08/23/2019 CLINICAL DATA:  Acute neuro deficit.  Suspect stroke EXAM: CT ANGIOGRAPHY HEAD AND NECK TECHNIQUE: Multidetector CT imaging of the head and neck was performed using the standard protocol during bolus administration of intravenous contrast. Multiplanar CT image reconstructions and MIPs were obtained to evaluate the vascular anatomy. Carotid stenosis measurements (when applicable) are obtained utilizing NASCET criteria, using the distal internal carotid diameter as the denominator. CONTRAST:  32mL OMNIPAQUE IOHEXOL 350 MG/ML SOLN COMPARISON:  CT head and MRI head 08/23/2019 FINDINGS: CTA NECK FINDINGS Aortic arch: Ascending aorta 41 mm in diameter. Mild atherosclerotic disease. 3 vessel aortic arch. Proximal great vessels widely patent. Right  carotid system: Mild atherosclerotic calcification right carotid bifurcation without significant  stenosis. Left carotid system: Mild atherosclerotic calcification left carotid bifurcation without significant stenosis. Vertebral arteries: Both vertebral arteries patent to the basilar without stenosis. Skeleton: ACDF C5-6 with solid fusion. Disc degeneration and spurring at C3-4. No acute skeletal abnormality. Other neck: Thyroidectomy. 15 mm cyst posterolateral to the trachea with mass-effect on the esophagus. Possible postop fluid collection. No enlarged lymph nodes in the neck. Upper chest: Lung apices clear bilaterally. Review of the MIP images confirms the above findings CTA HEAD FINDINGS Anterior circulation: Mild atherosclerotic disease in the cavernous carotid on the left. No significant carotid stenosis bilaterally. Anterior and middle cerebral arteries patent bilaterally without stenosis or aneurysm. Azygos anterior cerebral artery, normal variant. Posterior circulation: Both vertebral arteries patent to the basilar. PICA patent bilaterally. Basilar widely patent. Superior cerebellar and posterior cerebral arteries patent bilaterally. Venous sinuses: Normal venous enhancement Anatomic variants: None 19 mm calcified meningioma left frontal lobe. Review of the MIP images confirms the above findings IMPRESSION: 1. Negative for intracranial large vessel occlusion. No significant intracranial stenosis. 2. No significant carotid or vertebral artery stenosis in the neck. Mild atherosclerotic disease of the carotid bifurcation bilaterally. 3. Postop thyroidectomy. 15 mm cyst posterior to the left thyroid bed may be postop seroma. Correlate with prior surgical findings. 4. Ascending aorta dilated 41 mm. Recommend annual imaging followup by CTA or MRA. This recommendation follows 2010 ACCF/AHA/AATS/ACR/ASA/SCA/SCAI/SIR/STS/SVM Guidelines for the Diagnosis and Management of Patients with Thoracic Aortic Disease. Circulation. 2010; 121: M415-A309. Aortic aneurysm NOS (ICD10-I71.9) Electronically Signed   By:  Franchot Gallo M.D.   On: 08/23/2019 14:07   CT ANGIO NECK CODE STROKE  Result Date: 08/23/2019 CLINICAL DATA:  Acute neuro deficit.  Suspect stroke EXAM: CT ANGIOGRAPHY HEAD AND NECK TECHNIQUE: Multidetector CT imaging of the head and neck was performed using the standard protocol during bolus administration of intravenous contrast. Multiplanar CT image reconstructions and MIPs were obtained to evaluate the vascular anatomy. Carotid stenosis measurements (when applicable) are obtained utilizing NASCET criteria, using the distal internal carotid diameter as the denominator. CONTRAST:  40mL OMNIPAQUE IOHEXOL 350 MG/ML SOLN COMPARISON:  CT head and MRI head 08/23/2019 FINDINGS: CTA NECK FINDINGS Aortic arch: Ascending aorta 41 mm in diameter. Mild atherosclerotic disease. 3 vessel aortic arch. Proximal great vessels widely patent. Right carotid system: Mild atherosclerotic calcification right carotid bifurcation without significant stenosis. Left carotid system: Mild atherosclerotic calcification left carotid bifurcation without significant stenosis. Vertebral arteries: Both vertebral arteries patent to the basilar without stenosis. Skeleton: ACDF C5-6 with solid fusion. Disc degeneration and spurring at C3-4. No acute skeletal abnormality. Other neck: Thyroidectomy. 15 mm cyst posterolateral to the trachea with mass-effect on the esophagus. Possible postop fluid collection. No enlarged lymph nodes in the neck. Upper chest: Lung apices clear bilaterally. Review of the MIP images confirms the above findings CTA HEAD FINDINGS Anterior circulation: Mild atherosclerotic disease in the cavernous carotid on the left. No significant carotid stenosis bilaterally. Anterior and middle cerebral arteries patent bilaterally without stenosis or aneurysm. Azygos anterior cerebral artery, normal variant. Posterior circulation: Both vertebral arteries patent to the basilar. PICA patent bilaterally. Basilar widely patent. Superior  cerebellar and posterior cerebral arteries patent bilaterally. Venous sinuses: Normal venous enhancement Anatomic variants: None 19 mm calcified meningioma left frontal lobe. Review of the MIP images confirms the above findings IMPRESSION: 1. Negative for intracranial large vessel occlusion. No significant intracranial stenosis. 2. No significant carotid or vertebral artery stenosis in the  neck. Mild atherosclerotic disease of the carotid bifurcation bilaterally. 3. Postop thyroidectomy. 15 mm cyst posterior to the left thyroid bed may be postop seroma. Correlate with prior surgical findings. 4. Ascending aorta dilated 41 mm. Recommend annual imaging followup by CTA or MRA. This recommendation follows 2010 ACCF/AHA/AATS/ACR/ASA/SCA/SCAI/SIR/STS/SVM Guidelines for the Diagnosis and Management of Patients with Thoracic Aortic Disease. Circulation. 2010; 121: Q982-M415. Aortic aneurysm NOS (ICD10-I71.9) Electronically Signed   By: Franchot Gallo M.D.   On: 08/23/2019 14:07    Procedures Procedures (including critical care time)  Medications Ordered in ED Medications  iohexol (OMNIPAQUE) 350 MG/ML injection 75 mL (75 mLs Intravenous Contrast Given 08/23/19 1338)    ED Course  I have reviewed the triage vital signs and the nursing notes.  Pertinent labs & imaging results that were available during my care of the patient were reviewed by me and considered in my medical decision making (see chart for details).    MDM Rules/Calculators/A&P                          64yo female with history of COPD, coronary artery disease, ascending aortic aneurysm, hypertension, hyperlipidemia presents with concern for chest pain, lightheadedness, left arm heaviness and tingling.  Given symptoms in triage with left-sided weakness, code stroke was called.  Dr. Malen Gauze came to bedside and evaluated the patient.  She had emergent CT head, CT angio head and neck, and MRI which showed no evidence of stroke or acute vascular  occlusion.  There is no evidence of dissection on the included area of the aorta on the CT angio neck, and aneurysm measured 41 mm.  Reports headache in addition to the left-sided tingling, suspect this may be secondary to migraine or other stress reaction related to chest pain.  Her neurologic exam is normal at this time.  Dr. Rory Percy is not recommending further neurologic work-up.  Have low suspicion for aortic dissection in setting of imaging available on CTA neck, as well as low clinical suspicion of description of pain, equal bilateral upper extremity blood pressures, equal bilateral upper and lower extremity pulses.  Have low clinical suspicion for PE.  Description of her pain is concerning for angina.  She describes escalating exertional symptoms as well as chest pain at rest that has been worsening over the last week.  Troponin is pending at this time, but plan on consulting cardiology given history of coronary artery disease with concerning description of symptoms.    Final Clinical Impression(s) / ED Diagnoses Final diagnoses:  Chest pain, unspecified type  Left face and left arm tingling    Rx / DC Orders ED Discharge Orders    None       Gareth Morgan, MD 08/23/19 1622

## 2019-08-23 NOTE — ED Notes (Signed)
Pt ambulated to the bathroom with a steady gait.

## 2019-08-23 NOTE — Consult Note (Addendum)
NEURO HOSPITALIST  CONSULT   Requesting Physician: Dr. Billy Fischer    Chief Complaint:   History obtained from:  Patient   HPI:                                                                                                                                         Amy Hodge is an 64 y.o. female  With PMH AAA, MI, CAD, HLD, HTN, COPD, Hashimoto's disease who presented to [facility] ED with c/o blurred vision and left side weakness. Code stroke initiated in ED.  Patient went to fire station and had her BP checked. At fire station SBP was 214. She then went to her PCP office where they noted some left sided deficits and patient was brought to [facility] by EMS. On initial exam no left side deficits appreciated. Denies any blood thinners, drug use, ETOH. Endorses daily ASA  ED course:  CTH: no hemorrhage CTA: no LVO BP: 145/93 BG: 87    Date last known well: 08/23/19 Time last known well:1000 tPA Given: no; too mild to treat  Modified Rankin: Rankin Score=0  NIHSS: Initial 1 for sensation Re-examination 3 for drift and sensation  Past Medical History:  Diagnosis Date  . Allergy   . Anxiety   . COPD (chronic obstructive pulmonary disease) ([place])   . Coronary artery disease    a. s/p PCI of RCA in 2015 b. cath in 12/2015 showed 100% Ost RPDA with collaterals present  . DDD (degenerative disc disease), cervical   . Depression   . DJD (degenerative joint disease) of knee    RT  . GERD (gastroesophageal reflux disease)   . Hashimoto's disease   . Headache(784.0)   . Hyperlipidemia   . Hypertension   . Hypothyroidism   . Myocardial infarction ([place])   . Nocturnal hypoxemia 02/26/2015  . On supplemental oxygen therapy    Oxygen 2.5 l/m at bedtime.  . Stented coronary artery 02/26/2015    Past Surgical History:  Procedure Laterality Date  . ABDOMINAL HYSTERECTOMY    . APPENDECTOMY  1971  . BUNIONECTOMY Right   . CARDIAC CATHETERIZATION  N/A 09/02/2014   Procedure: Left Heart Cath and Coronary Angiography;  Surgeon: Adrian Prows, MD;  Location: [place] CV LAB;  Service: Cardiovascular;  Laterality: N/A;  . CARDIAC CATHETERIZATION N/A 12/15/2015   Procedure: Left Heart Cath and Coronary Angiography;  Surgeon: Jettie Booze, MD;  Location: [place] CV LAB;  Service: Cardiovascular;  Laterality: N/A;  . CERVICAL FUSION    . CESAREAN SECTION    . COLONOSCOPY WITH PROPOFOL N/A 02/24/2015   Procedure: COLONOSCOPY WITH  PROPOFOL;  Surgeon: Garlan Fair, MD;  Location: [facility];  Service: Endoscopy;  Laterality: N/A;  . ESOPHAGOGASTRODUODENOSCOPY (EGD) WITH PROPOFOL N/A 02/24/2015   Procedure: ESOPHAGOGASTRODUODENOSCOPY (EGD) WITH PROPOFOL;  Surgeon: Garlan Fair, MD;  Location: [facility];  Service: Endoscopy;  Laterality: N/A;  . HAND SURGERY     due to trauma-dog bite.  . I & D EXTREMITY Bilateral 05/17/2013   Procedure: IRRIGATION AND DEBRIDEMENT EXTREMITY;  Surgeon: Roseanne Kaufman, MD;  Location: [place];  Service: Orthopedics;  Laterality: Bilateral;  . LEFT HEART CATHETERIZATION WITH CORONARY ANGIOGRAM N/A 03/04/2014   Procedure: LEFT HEART CATHETERIZATION WITH CORONARY ANGIOGRAM;  Surgeon: Laverda Page, MD;  Location: [facility] CATH LAB;  Service: Cardiovascular;  Laterality: N/A;  . OOPHORECTOMY    . SHOULDER SURGERY     RT  . THYROIDECTOMY  1996  . TONSILLECTOMY  1965  . TOTAL ABDOMINAL HYSTERECTOMY W/ BILATERAL SALPINGOOPHORECTOMY  01/2008   with cervical dysplasia  . TUBAL LIGATION  1984    Family History  Problem Relation Age of Onset  . Lung cancer Mother   . Hypertension Mother   . Stroke Mother   . Diabetes Mother   . Diabetes Father   . Hypertension Father   . Heart attack Father   . Asthma Father   . COPD Father   . Alcohol abuse Father   . Diabetes Sister   . Hypertension Sister          Social History:  reports that she quit smoking about 23 years ago. Her smoking use included  cigarettes. She has a 50.00 pack-year smoking history. She has never used smokeless tobacco. She reports that she does not drink alcohol and does not use drugs.  Allergies:  Allergies  Allergen Reactions  . Bactrim [Sulfamethoxazole-Trimethoprim] Shortness Of Breath, Anxiety and Palpitations  . Ketorolac Shortness Of Breath  . Anoro Ellipta [Umeclidinium-Vilanterol]     Per patient, caused painful blisters in her mouth.   . Morphine Other (See Comments)    headache  . Tetracyclines & Related Itching  . Valsartan Other (See Comments) and Cough    Headaches also   . Bystolic [Nebivolol Hcl] Other (See Comments)    Fatigue- Tired feeling  . Isosorbide Other (See Comments)    HEADACHES   . Levofloxacin Hives  . Lisinopril Cough  . Metoprolol Other (See Comments)    FATIGUE  . Morphine And Related Other (See Comments)    headache  . Simvastatin Other (See Comments)    Makes legs hurt/ sore   . Singulair [Montelukast Sodium] Palpitations  . Statins Other (See Comments)    MYALGIAS  . Tetracycline Hives and Itching  . Toradol [Ketorolac Tromethamine] Palpitations    Medications:                                                                                                                           No current facility-administered medications for this encounter.  Current Outpatient Medications  Medication Sig Dispense Refill  . acetaminophen (TYLENOL) 500 MG tablet Take 1,000 mg by mouth every 8 (eight) hours as needed (for pain or headahces).     Marland Kitchen amitriptyline (ELAVIL) 25 MG tablet Take 2 tablets (50 mg total) by mouth at bedtime. 180 tablet 0  . amLODipine (NORVASC) 2.5 MG tablet TAKE 1 TABLET EVERY DAY 90 tablet 0  . aspirin EC 81 MG tablet Take 81 mg by mouth daily.    Marland Kitchen azithromycin (ZITHROMAX) 250 MG tablet 500mg  (two tablets) today, then 250mg  (1 tablet) for the next 4 days 6 tablet 0  . B Complex Vitamins (VITAMIN B COMPLEX PO) Take 1 tablet by mouth daily.    Marland Kitchen  buPROPion (WELLBUTRIN XL) 150 MG 24 hr tablet Take 1 tablet (150 mg total) by mouth daily. 90 tablet 0  . chlorpheniramine-HYDROcodone (TUSSIONEX PENNKINETIC ER) 10-8 MG/5ML SUER Take 5 mLs by mouth every 12 (twelve) hours as needed for cough. 140 mL 0  . Cholecalciferol (VITAMIN D3) 5000 units CAPS Take 1 capsule by mouth daily.    . cyclobenzaprine (FLEXERIL) 10 MG tablet Take 1 tablet (10 mg total) by mouth 3 (three) times daily as needed for muscle spasms. 30 tablet 0  . DULoxetine (CYMBALTA) 20 MG capsule Take 2 capsules (40 mg total) by mouth daily. 180 capsule 0  . Evolocumab (REPATHA SURECLICK) 161 MG/ML SOAJ Inject 140 mg into the skin every 14 (fourteen) days. 2 pen 11  . fexofenadine (ALLEGRA) 180 MG tablet Take 1 tablet (180 mg total) by mouth daily. 90 tablet 3  . levothyroxine (SYNTHROID) 175 MCG tablet Take 1 tablet (175 mcg total) by mouth daily before breakfast. 90 tablet 0  . meloxicam (MOBIC) 15 MG tablet Take 1 tablet (15 mg total) by mouth daily. 90 tablet 0  . montelukast (SINGULAIR) 10 MG tablet Take 1 tablet (10 mg total) by mouth at bedtime. 30 tablet 0  . nitroGLYCERIN (NITROSTAT) 0.4 MG SL tablet Place 0.4 mg under the tongue every 5 (five) minutes as needed for chest pain.    Marland Kitchen omeprazole (PRILOSEC) 20 MG capsule Take 1 capsule (20 mg total) by mouth in the morning and at bedtime. 180 capsule 0  . spironolactone (ALDACTONE) 25 MG tablet Take 1 tablet (25 mg total) by mouth daily. 90 tablet 0     ROS:                                                                                                                                       ROS was performed and is negative except as noted in HPI    General Examination:  Blood pressure (!) 145/93, pulse 82, temperature 98.2 F (36.8 C), temperature source Oral, resp. rate 16, height 5\' 5"  (1.651 m), weight 87.5 kg, SpO2 100  %.  Physical Exam  Constitutional: Appears well-developed and well-nourished.  Psych: Affect appropriate to situation Eyes: Normal external eye and conjunctiva. HENT: Normocephalic, no lesions, without obvious abnormality.   Musculoskeletal-no joint tenderness, deformity or swelling Cardiovascular: Normal rate and regular rhythm.  Respiratory: Effort normal, non-labored breathing saturations WNL GI: Soft.  No distension. There is no tenderness.  Skin: WDI  Neurological Examination Mental Status: Alert, oriented, thought content appropriate.  Speech fluent without evidence of aphasia.  Able to follow  commands without difficulty. Cranial Nerves: II:  Visual fields grossly normal,  III,IV, VI: ptosis not present, extra-ocular motions intact bilaterally, pupils equal, round, reactive to light and accommodation V,VII: smile symmetric, facial light touch sensation normal bilaterally VIII: hearing normal bilaterally IX,X: uvula rises midline XI: bilateral shoulder shrug XII: midline tongue extension Motor: Right : Upper extremity   5/5 Left:     Upper extremity   4+/5  Lower extremity   5/5  Lower extremity   4+/5 *effort dependent weakness LLE  + curtain sign LUE Tone and bulk:normal tone throughout; no atrophy noted Sensory: decreased sensation LUE; patient splitting tuning fork across forehead Cerebellar: No ataxia noted Gait: deferred   Lab Results: Basic Metabolic Panel: Recent Labs  Lab 08/23/19 1322  NA 138  K 4.3  CL 101  GLUCOSE 90  BUN 15  CREATININE 0.90    CBC: Recent Labs  Lab 08/23/19 1305 08/23/19 1322  WBC 7.8  --   NEUTROABS 4.3  --   HGB 14.2 13.9  HCT 43.2 41.0  MCV 90.4  --   PLT 279  --     CBG: Recent Labs  Lab 08/23/19 1314  GLUCAP 87    Imaging: CT HEAD CODE STROKE WO CONTRAST  Result Date: 08/23/2019 CLINICAL DATA:  Code stroke.  Left-sided weakness EXAM: CT HEAD WITHOUT CONTRAST TECHNIQUE: Contiguous axial images were obtained  from the base of the skull through the vertex without intravenous contrast. COMPARISON:  MRI head 07/19/2018 FINDINGS: Brain: Ventricle size and cerebral volume normal. Negative for acute infarct. Negative for hemorrhage. 19 mm calcified mass left frontal lobe compatible with meningioma. This has slightly enlarged since 2010 when it measured 17 mm.No edema in the brain Vascular: Vessels are mildly hyperdense diffusely without focal thrombosis. Mild calcification in the carotid and basilar arteries. Skull: Negative Sinuses/Orbits: Paranasal sinuses clear.  Normal orbit bilaterally. Other: None ASPECTS ([place] Stroke Program Early CT Score) - Ganglionic level infarction (caudate, lentiform nuclei, internal capsule, insula, M1-M3 cortex): 7 - Supraganglionic infarction (M4-M6 cortex): 3 Total score (0-10 with 10 being normal): 10 IMPRESSION: 1. Negative for acute infarct or hemorrhage 2. ASPECTS is 10 3. 19 mm calcified meningioma left frontal lobe with mild growth since 2010. 4. Results texted to Dr. Rory Percy Electronically Signed   By: Franchot Gallo M.D.   On: 08/23/2019 13:23       Laurey Morale, MSN, NP-C [facility] [phone]  08/23/2019, 1:35 PM   Attending physician note to follow with Assessment and plan .   Assessment: 64 y.o. female With PMH AAA, MI, CAD, HLD, HTN, COPD, Hashimoto's disease who presented to [facility] ED with c/o blurred vision and left side weakness. Code stroke initiated in ED. CTH did not show a hemorrhage. CTA did not show an LVO. Exam is inconsistent with a CVA, but given  patient cardiac history will obtain STAT MRI.   Recommendations: -- STAT MRI   ATTENDING ADDENDUM Patient seen and examined as acute stroke code for left sided numbness and weakness. 64 year old with extensive cardiac history including that of coronary artery disease, MI, status post 4 stents, headaches, hyperlipidemia, hypertension, COPD, tobacco abuse, presenting with last seen normal  10:30 AM.  Patient presented to emergency room after she had sudden onset of left head neck and arm pain followed by numbness and weakness while driving. Her symptoms were fluctuating.  She went to the fire department who checked her blood pressure and systolic blood pressure was in the 200s. Code stroke was activated in the emergency room after she was evaluated by the ED staff. On my initial evaluation, her only deficits were left-sided sensory symptoms but later on she had arm and leg drift with effort dependent weakness.  Physical exam Awake alert oriented x3 No dysarthria No aphasia Cranial nerves: Pupils equal round reactive light, extraocular movements intact, visual fields full, facial sensation diminished on the left face with strong cut off in the midline to tuning fork on the forehead, face is symmetric, tongue and palate midline midline Motor exam: On initial exam there was no drift in any of the extremities but on a later exam she had left upper and lower extremity drift with positive Hoover sign and the arm drift with hand and arm never falling on her face. Sensory exam: Consistently diminished left-sided sensation with splitting right in the middle and diminished vibratory sense on left forehead compared to right as documented above. Coordination: No dysmetria NIH stroke scale-initially 1, worsened to 3 with the arm and leg drift.  Given her multiple risk factors, she was taken for a stat MRI after head CT was negative for acute process. Emergent head and neck vessel imaging was not performed because of exam not being consistent with an LVO.  MRI of the brain-all images were independently reviewed by me-no evidence of acute stroke.  There is a calcified meningioma in the left frontal area, which is unchanged from at least as far back as 2010 MRI.   Assessment and recommendations: 64 year old woman with extensive cardiovascular risk factors presenting with sudden onset of  left-sided headache, neck pain and left sided sensory deficits and left arm and leg weakness. Her exam had some nonorganic-looking components which argues against this being a stroke, but given her extensive risk factors, I decided to pursue a stat MRI of the brain to ensure that we are not missing a stroke. MRI of the brain did not reveal a stroke. I suspect that the symptoms are related to anxiety/other psychological reasons. Given her cardiac history, further cardiac work-up is deferred to the ED team. Continue aspirin 81. I have discussed my plan in detail with Dr. Billy Fischer, ED provider. No further neurological recommendations at this time. Please call inpatient neurology with questions if we can be of any further assistance. -- Amie Portland, MD [facility] Pager: [phone] If 7pm to 7am, please call on call as listed on AMION.

## 2019-08-23 NOTE — Telephone Encounter (Signed)
Returned call to patient of Dr. Stanford Breed. She is currently on the way to [facility] ED. Call made to nurse first/triage x3 and was unsuccessful in reaching someone at [place]  Patient has CAD, Thoracic Aortic Aneurysm

## 2019-08-23 NOTE — ED Notes (Signed)
Pt verbalized understanding of discharge instructions. Follow up care reviewed, pt had no further questions. 

## 2019-08-23 NOTE — ED Triage Notes (Signed)
Pt here from home for sudden onset HA and cp that radiates to neck and L arm. LKN was 1030. Pt called her PCP and they recommended her to come to ED for MI eval. Upon triage, code stroke activated for L side weakness. Pt aox4, vss. Hx of aortic aneurysm, htn

## 2019-08-26 ENCOUNTER — Encounter: Payer: Self-pay | Admitting: Cardiology

## 2019-08-26 NOTE — Telephone Encounter (Signed)
error 

## 2019-09-05 ENCOUNTER — Ambulatory Visit
Admission: RE | Admit: 2019-09-05 | Discharge: 2019-09-05 | Disposition: A | Discharge status: Home / Self Care | Payer: Medicare HMO | Source: Ambulatory Visit | Attending: Cardiology | Admitting: Cardiology

## 2019-09-05 DIAGNOSIS — I712 Thoracic aortic aneurysm, without rupture, unspecified: Secondary | ICD-10-CM

## 2019-09-05 DIAGNOSIS — J432 Centrilobular emphysema: Secondary | ICD-10-CM | POA: Diagnosis not present

## 2019-09-05 DIAGNOSIS — I700 Atherosclerosis of aorta: Secondary | ICD-10-CM | POA: Diagnosis not present

## 2019-09-05 DIAGNOSIS — I2510 Atherosclerotic heart disease of native coronary artery without angina pectoris: Secondary | ICD-10-CM | POA: Diagnosis not present

## 2019-09-05 MED ORDER — IOPAMIDOL (ISOVUE-370) INJECTION 76%
75.0000 mL | Freq: Once | INTRAVENOUS | Status: AC | PRN
  Administered 2019-09-05: 75 mL via INTRAVENOUS

## 2019-09-06 ENCOUNTER — Telehealth: Payer: Self-pay | Admitting: *Deleted

## 2019-09-06 DIAGNOSIS — R9389 Abnormal findings on diagnostic imaging of other specified body structures: Secondary | ICD-10-CM

## 2019-09-06 NOTE — Telephone Encounter (Addendum)
-----   Message from Lelon Perla, MD sent at 09/05/2019  2:30 PM EDT ----- Dilated thoracic aorta unchanged in size; schedule echo to estimate PA pressure; schedule elective fuov [place] with pt, aware of results. Echo scheduled.

## 2019-09-10 ENCOUNTER — Other Ambulatory Visit: Payer: Self-pay

## 2019-09-10 ENCOUNTER — Ambulatory Visit (HOSPITAL_COMMUNITY): Payer: Medicare HMO | Attending: Internal Medicine

## 2019-09-10 DIAGNOSIS — J449 Chronic obstructive pulmonary disease, unspecified: Secondary | ICD-10-CM | POA: Diagnosis not present

## 2019-09-10 DIAGNOSIS — I77810 Thoracic aortic ectasia: Secondary | ICD-10-CM | POA: Insufficient documentation

## 2019-09-10 DIAGNOSIS — I252 Old myocardial infarction: Secondary | ICD-10-CM | POA: Diagnosis not present

## 2019-09-10 DIAGNOSIS — I2510 Atherosclerotic heart disease of native coronary artery without angina pectoris: Secondary | ICD-10-CM | POA: Insufficient documentation

## 2019-09-10 DIAGNOSIS — I272 Pulmonary hypertension, unspecified: Secondary | ICD-10-CM | POA: Insufficient documentation

## 2019-09-10 DIAGNOSIS — G4733 Obstructive sleep apnea (adult) (pediatric): Secondary | ICD-10-CM | POA: Insufficient documentation

## 2019-09-10 DIAGNOSIS — R9389 Abnormal findings on diagnostic imaging of other specified body structures: Secondary | ICD-10-CM | POA: Insufficient documentation

## 2019-09-10 DIAGNOSIS — R0602 Shortness of breath: Secondary | ICD-10-CM | POA: Insufficient documentation

## 2019-09-10 DIAGNOSIS — R42 Dizziness and giddiness: Secondary | ICD-10-CM | POA: Insufficient documentation

## 2019-09-10 DIAGNOSIS — I517 Cardiomegaly: Secondary | ICD-10-CM | POA: Insufficient documentation

## 2019-09-12 ENCOUNTER — Telehealth: Payer: Self-pay | Admitting: Cardiology

## 2019-09-12 NOTE — Telephone Encounter (Addendum)
Spoke with pt, she is quit anxious and upset. She reports her bp is running high 145-154/98-106. She reports her heart racing and feeling dizziness and SOB. She reports pressure in her chest when lying flat and feeling like she can not breathe with a dry cough. She reports swelling by the end of the day that can be down in the morning. She reports weight gain from 193 to 197 lb. She reports SOB with rest and little activity. Patient schedule to see the APP Monday 09/16/19. Marland Kitchenpuv to go to the ER for concerns prior to visit.

## 2019-09-12 NOTE — Telephone Encounter (Signed)
Called patient - advised that I did not see any sooner appointments, but would send a message to his nurse to see if Dr.Crenshaw had any spots he could use- or if he could look over results.  Patient is very concerned, and does not want to see a APP.  Will route to nurse.

## 2019-09-12 NOTE — Telephone Encounter (Signed)
Patient calling requesting a sooner in office appointment with Dr. Stanford Breed and not an APP. She is scheduled 10/28/2019 and I did not see anything sooner. She states she was recently in the hospital and she wants to speak with him about her CT results.

## 2019-09-16 ENCOUNTER — Ambulatory Visit: Payer: Medicare HMO | Admitting: Physician Assistant

## 2019-09-16 ENCOUNTER — Encounter: Payer: Self-pay | Admitting: Physician Assistant

## 2019-09-16 ENCOUNTER — Other Ambulatory Visit: Payer: Self-pay

## 2019-09-16 VITALS — BP 129/86 | HR 84 | Ht 65.0 in | Wt 197.8 lb

## 2019-09-16 DIAGNOSIS — R072 Precordial pain: Secondary | ICD-10-CM | POA: Diagnosis not present

## 2019-09-16 DIAGNOSIS — I25119 Atherosclerotic heart disease of native coronary artery with unspecified angina pectoris: Secondary | ICD-10-CM

## 2019-09-16 DIAGNOSIS — I10 Essential (primary) hypertension: Secondary | ICD-10-CM

## 2019-09-16 DIAGNOSIS — I712 Thoracic aortic aneurysm, without rupture, unspecified: Secondary | ICD-10-CM

## 2019-09-16 DIAGNOSIS — E7800 Pure hypercholesterolemia, unspecified: Secondary | ICD-10-CM

## 2019-09-16 DIAGNOSIS — J432 Centrilobular emphysema: Secondary | ICD-10-CM | POA: Diagnosis not present

## 2019-09-16 DIAGNOSIS — E039 Hypothyroidism, unspecified: Secondary | ICD-10-CM

## 2019-09-16 DIAGNOSIS — R5383 Other fatigue: Secondary | ICD-10-CM

## 2019-09-16 MED ORDER — AMLODIPINE BESYLATE 5 MG PO TABS: 5.0000 mg | ORAL_TABLET | Freq: Every day | ORAL | 3 refills | Status: DC

## 2019-09-16 NOTE — Patient Instructions (Addendum)
Medication Instructions:   INCREASE Amlodipine to 5 mg daily  *If you need a refill on your cardiac medications before your next appointment, please call your pharmacy*  Lab Work: NONE ordered at this time of appointment   If you have labs (blood work) drawn today and your tests are completely normal, you will receive your results only by: Marland Kitchen MyChart Message (if you have MyChart) OR . A paper copy in the mail If you have any lab test that is abnormal or we need to change your treatment, we will call you to review the results.  Testing/Procedures: NONE ordered at this time of appointment   Follow-Up: At [facility], you and your health needs are our priority.  As part of our continuing mission to provide you with exceptional heart care, we have created designated Provider Care Teams.  These Care Teams include your primary Cardiologist (physician) and Advanced Practice Providers (APPs -  Physician Assistants and Nurse Practitioners) who all work together to provide you with the care you need, when you need it.  Your next appointment:   As scheduled 10/28/19 at 9:40 AM   The format for your next appointment:   In Person  Provider:   Kirk Ruths, MD  Other Instructions

## 2019-09-16 NOTE — Progress Notes (Signed)
Cardiology Office Note:    Date:  09/18/2019   ID:  [name], DOB 1955-03-08, MRN [id number]  PCP:  Lorrene Reid, PA-C  [facility] Cardiologist:  Kirk Ruths, MD  [facility] Electrophysiologist:  None   Referring MD: Lorrene Reid, PA-C   Chief Complaint  Patient presents with   Follow-up    seen for Dr. Stanford Breed    History of Present Illness:    Amy Hodge is a 64 y.o. female with a hx of CAD, Hashimoto's disease, GERD, depression, anxiety, hypertension, hyperlipidemia, and hypothyroidism.  Patient was previously followed by Dr. Einar Gip.  She had NSTEMI in March 2015 treated with PCI to RCA.  This was performed in [place].  Echocardiogram in March 2015 showed normal EF, grade 1 DD, mild LVH.  ABI in December 2015 was normal.  Repeat cardiac catheterization in June 2016 showed 100% occluded PDA with left-to-right collaterals, patent stents in the proximal to mid RCA, no obstructive disease in the left system.  CTO PTCA was unsuccessful.  Repeat cardiac catheterization October 2017 showed 100% occluded PDA with left-to-right collaterals, patent RCA stents.  She had cough with ACE inhibitor and myalgia was Crestor.  She also did not tolerate beta-blocker, nitrates or Ranexa.  Her medical therapy include calcium channel blocker.  She has discontinued Repatha due to expense, however at the same time intolerant of statins and Zetia. She was seen by Dr. Martinique in the past who did not think CTO intervention or CABG was appropriate.  Previous CTA obtained in June 2020 showed mild aneurysmal dilatation of the ascending thoracic aorta measuring at 4 cm.  Patient was last seen by Dr. Stanford Breed in February 2021 at which time she continued to have chest pain which is chronic for her.  More recently, patient went to the ED on 08/23/2019 due to headache and chest pain and elevated blood pressure.  Per telephone note, blood pressure went up to 240/142 at the local fire  department.  On arrival to the ED, she also complained of left arm sensory loss.  A code stroke was called.  CT scan was negative for hemorrhage.  After the initial evaluation, patient developed left arm and left leg drift, and was sensory loss.  Stat MRI was ordered which was negative for stroke.  Patient was seen by on-call neurologist who felt she is not a candidate for thrombectomy or TPA due to negative MRI finding.  Code stroke was later canceled.  Serial troponin was negative x2.  Since ED visit, CTA of the chest obtained on 09/05/2019 showed ascending aorta measuring at 41 mm, prominent main pulmonary artery measuring 3.7 cm suggestive of pulmonary artery hypertension, progressive heterogeneous attenuation and enhancement in the liver parenchyma with differential diagnosis including hepatic venoocclusive disease, Budd-Chiari syndrome, congestive hepatopathy, and pulmonary hypertension.  Echocardiogram performed on 09/10/2019 showed EF 50 to 55%, grade 1 DD, moderate hypokinesis of the left ventricular, basal mid lateral wall, trivial AI, mild to moderate dilatation of the ascending aorta, mildly elevated pulmonary artery systolic pressure at 70.3 mmHg.  Patient presented to cardiology office today for evaluation of her chest pain and labile blood pressure recently.  She says she had a significant chest pain on the day she went to the ED, since then, her chest pain has quieted down somewhat back to the previous level.  Her major concern today is significant fatigue that has been going on for the past several months.  Recent lab work showed no sign of  anemia and a normal renal function and electrolytes.  It was normal echocardiogram, I do not have a good explanation for her fatigue.  She does admit to have some anxiety issue due to multiple deaths in her family this year.  I wonder if there could be a depression issue going on at the same time.  She says it does not take much exercise for her to become short  of breath.  I reviewed that the recent echocardiogram with her and I do not think a cardiac cause explains her issue.  She does have history of emphysema and plan to see her pulmonologist.  As for her labile blood pressure and chest discomfort lately, I did discuss the case with Dr. Stanford Breed and I recommended increase the amlodipine to 5 mg daily.  She is intolerant of beta-blocker and nitrate.  I reviewed the EKG today with Dr. Stanford Breed, that Q-wave in the inferior lead does appear to be more prominent, however we do not think this is significantly changed when compared to the previous EKG which also showed Q waves.  Past Medical History:  Diagnosis Date   Allergy    Anxiety    COPD (chronic obstructive pulmonary disease) ([place])    Coronary artery disease    a. s/p PCI of RCA in 2015 b. cath in 12/2015 showed 100% Ost RPDA with collaterals present   DDD (degenerative disc disease), cervical    Depression    DJD (degenerative joint disease) of knee    RT   GERD (gastroesophageal reflux disease)    Hashimoto's disease    Headache(784.0)    Hyperlipidemia    Hypertension    Hypothyroidism    Myocardial infarction ([place])    Nocturnal hypoxemia 02/26/2015   On supplemental oxygen therapy    Oxygen 2.5 l/m at bedtime.   Stented coronary artery 02/26/2015    Past Surgical History:  Procedure Laterality Date   ABDOMINAL HYSTERECTOMY     APPENDECTOMY  1971   BUNIONECTOMY Right    CARDIAC CATHETERIZATION N/A 09/02/2014   Procedure: Left Heart Cath and Coronary Angiography;  Surgeon: Adrian Prows, MD;  Location: [facility];  Service: Cardiovascular;  Laterality: N/A;   CARDIAC CATHETERIZATION N/A 12/15/2015   Procedure: Left Heart Cath and Coronary Angiography;  Surgeon: Jettie Booze, MD;  Location: [facility];  Service: Cardiovascular;  Laterality: N/A;   CERVICAL FUSION     CESAREAN SECTION     COLONOSCOPY WITH PROPOFOL N/A 02/24/2015    Procedure: COLONOSCOPY WITH PROPOFOL;  Surgeon: Garlan Fair, MD;  Location: [facility];  Service: Endoscopy;  Laterality: N/A;   ESOPHAGOGASTRODUODENOSCOPY (EGD) WITH PROPOFOL N/A 02/24/2015   Procedure: ESOPHAGOGASTRODUODENOSCOPY (EGD) WITH PROPOFOL;  Surgeon: Garlan Fair, MD;  Location: [facility];  Service: Endoscopy;  Laterality: N/A;   HAND SURGERY     due to trauma-dog bite.   I & D EXTREMITY Bilateral 05/17/2013   Procedure: IRRIGATION AND DEBRIDEMENT EXTREMITY;  Surgeon: Roseanne Kaufman, MD;  Location: [facility];  Service: Orthopedics;  Laterality: Bilateral;   LEFT HEART CATHETERIZATION WITH CORONARY ANGIOGRAM N/A 03/04/2014   Procedure: LEFT HEART CATHETERIZATION WITH CORONARY ANGIOGRAM;  Surgeon: Laverda Page, MD;  Location: [facility];  Service: Cardiovascular;  Laterality: N/A;   OOPHORECTOMY     SHOULDER SURGERY     RT   [place]   TOTAL ABDOMINAL HYSTERECTOMY W/ BILATERAL SALPINGOOPHORECTOMY  01/2008   with cervical dysplasia  TUBAL LIGATION  1984    Current Medications: Current Meds  Medication Sig   acetaminophen (TYLENOL) 500 MG tablet Take 1,000 mg by mouth every 8 (eight) hours as needed (for pain or headaches).    albuterol (VENTOLIN HFA) 108 (90 Base) MCG/ACT inhaler Inhale 1-2 puffs into the lungs every 6 (six) hours as needed for wheezing or shortness of breath.   amitriptyline (ELAVIL) 25 MG tablet Take 2 tablets (50 mg total) by mouth at bedtime.   amLODipine (NORVASC) 5 MG tablet Take 1 tablet (5 mg total) by mouth daily.   aspirin EC 81 MG tablet Take 81 mg by mouth daily at 6 PM.    B Complex Vitamins (VITAMIN B COMPLEX PO) Take 1 tablet by mouth daily.   buPROPion (WELLBUTRIN XL) 150 MG 24 hr tablet Take 1 tablet (150 mg total) by mouth daily. (Patient taking differently: Take 150 mg by mouth in the morning. )   Cholecalciferol (VITAMIN D3) 5000 units CAPS Take 5,000 Units by mouth daily.     cyclobenzaprine (FLEXERIL) 10 MG tablet Take 1 tablet (10 mg total) by mouth 3 (three) times daily as needed for muscle spasms.   DULoxetine (CYMBALTA) 20 MG capsule Take 2 capsules (40 mg total) by mouth daily.   fexofenadine (ALLEGRA) 180 MG tablet Take 1 tablet (180 mg total) by mouth daily.   levothyroxine (SYNTHROID) 175 MCG tablet Take 1 tablet (175 mcg total) by mouth daily before breakfast.   meloxicam (MOBIC) 15 MG tablet Take 1 tablet (15 mg total) by mouth daily.   nitroGLYCERIN (NITROSTAT) 0.4 MG SL tablet Place 0.4 mg under the tongue every 5 (five) minutes as needed for chest pain.   omeprazole (PRILOSEC) 20 MG capsule Take 1 capsule (20 mg total) by mouth in the morning and at bedtime.   spironolactone (ALDACTONE) 25 MG tablet Take 1 tablet (25 mg total) by mouth daily.   [DISCONTINUED] amLODipine (NORVASC) 2.5 MG tablet TAKE 1 TABLET EVERY DAY (Patient taking differently: Take 2.5 mg by mouth in the morning. )     Allergies:   Bactrim [sulfamethoxazole-trimethoprim], Ketorolac, Anoro ellipta [umeclidinium-vilanterol], Morphine, Tetracyclines & related, Valsartan, Bystolic [nebivolol hcl], Isosorbide, Levofloxacin, Lisinopril, Metoprolol, Morphine and related, Simvastatin, Singulair [montelukast sodium], Statins, Tetracycline, and Toradol [ketorolac tromethamine]   Social History   Socioeconomic History   Marital status: Married    Spouse name: Not on file   Number of children: 1   Years of education: Not on file   Highest education level: Not on file  Occupational History   Occupation: disabled    Employer: [employer]  Tobacco Use   Smoking status: Former Smoker    Packs/day: 2.00    Years: 25.00    Pack years: 50.00    Types: Cigarettes    Quit date: 04/23/1996    Years since quitting: 23.4   Smokeless tobacco: Never Used  Vaping Use   Vaping Use: Never used  Substance and Sexual Activity   Alcohol use: No    Alcohol/week: 0.0 standard drinks    Drug use: No   Sexual activity: Not Currently  Other Topics Concern   Not on file  Social History Narrative   On worker's comp for lower back and right shoulder - applying for disability; losing [employer] insurance 03/07/2010   Social Determinants of Health   Financial Resource Strain:    Difficulty of Paying Living Expenses:   Food Insecurity:    Worried About Estate manager/land agent of Food in the Last Year:  Ran Out of Food in the Last Year:   Transportation Needs:    Film/video editor (Medical):    Lack of Transportation (Non-Medical):   Physical Activity:    Days of Exercise per Week:    Minutes of Exercise per Session:   Stress:    Feeling of Stress :   Social Connections:    Frequency of Communication with Friends and Family:    Frequency of Social Gatherings with Friends and Family:    Attends Religious Services:    Active Member of Clubs or Organizations:    Attends Music therapist:    Marital Status:      Family History: The patient's family history includes Alcohol abuse in her father; Asthma in her father; COPD in her father; Diabetes in her father, mother, and sister; Heart attack in her father; Hypertension in her father, mother, and sister; Lung cancer in her mother; Stroke in her mother.  ROS:   Please see the history of present illness.     All other systems reviewed and are negative.  EKGs/Labs/Other Studies Reviewed:    The following studies were reviewed today:  Echo 09/10/2019 1. Left ventricular ejection fraction, by estimation, is 50 to 55%. The  left ventricle has low normal function. The left ventricle has no regional  wall motion abnormalities. There is mild left ventricular hypertrophy.  Left ventricular diastolic  parameters are consistent with Grade I diastolic dysfunction (impaired  relaxation). There is moderate hypokinesis of the left ventricular,  basal-mid lateral wall.  2. Right ventricular systolic function is  normal. The right ventricular  size is normal. There is mildly elevated pulmonary artery systolic  pressure.  3. The mitral valve is grossly normal. Trivial mitral valve  regurgitation.  4. The aortic valve is tricuspid. Aortic valve regurgitation is trivial.  Aortic regurgitation PHT measures 698 msec.  5. Aortic dilatation noted. There is mild to moderate dilatation of the  ascending aorta measuring 42 mm.  6. The inferior vena cava is normal in size with greater than 50%  respiratory variability, suggesting right atrial pressure of 3 mmHg.   Comparison(s): Changes from prior study are noted. 05/16/13: LVEF 60-65%,  normal wall motion.   EKG:  EKG is ordered today.  The ekg ordered today demonstrates normal sinus rhythm, deep Q waves in lead III, otherwise no significant ST or T wave changes.  Recent Labs: 07/25/2019: TSH 1.960 08/23/2019: ALT 12; BUN 15; Creatinine, Ser 0.90; Hemoglobin 13.9; Platelets 279; Potassium 4.3; Sodium 138  Recent Lipid Panel    Component Value Date/Time   CHOL 185 01/25/2019 0812   TRIG 113 01/25/2019 0812   HDL 53 01/25/2019 0812   CHOLHDL 3.5 01/25/2019 0812   CHOLHDL 5.8 (H) 12/11/2015 1242   VLDL 48 (H) 12/11/2015 1242   LDLCALC 112 (H) 01/25/2019 0812   LDLDIRECT 143.0 11/05/2015 1614    Physical Exam:    VS:  BP 129/86    Pulse 84    Ht 5\' 5"  (1.651 m)    Wt 197 lb 12.8 oz (89.7 kg)    SpO2 99%    BMI 32.92 kg/m     Wt Readings from Last 3 Encounters:  09/16/19 197 lb 12.8 oz (89.7 kg)  08/23/19 193 lb (87.5 kg)  07/31/19 194 lb 9.6 oz (88.3 kg)     GEN:  Well nourished, well developed in no acute distress HEENT: Normal NECK: No JVD; No carotid bruits LYMPHATICS: No lymphadenopathy CARDIAC: RRR, no murmurs,  rubs, gallops RESPIRATORY:  Clear to auscultation without rales, wheezing or rhonchi  ABDOMEN: Soft, non-tender, non-distended MUSCULOSKELETAL:  No edema; No deformity  SKIN: Warm and dry NEUROLOGIC:  Alert and oriented x  3 PSYCHIATRIC:  Normal affect   ASSESSMENT:    1. Precordial pain   2. Coronary artery disease involving native coronary artery of native heart with angina pectoris ([place])   3. Essential hypertension   4. Pure hypercholesterolemia   5. Hypothyroidism, unspecified type   6. Thoracic aortic aneurysm without rupture ([place])   7. Fatigue, unspecified type   8. Centrilobular emphysema ([place])    PLAN:    In order of problems listed above:  1. Precordial pain: She has chronic chest pain, previous cardiac catheterization in June 2016 showed 100% occluded PDA with left-to-right collaterals, patent stents in the proximal to mid RCA.  Patient was evaluated by Dr. Martinique previously who felt CTO PCI of PDA is unlikely to benefit her.  Recent worsening chest pain occurred in the setting of uncontrolled blood pressure.  I recommended increasing amlodipine to 5 mg daily to help with her labile blood pressure and also for antianginal purpose  2. CAD: Last cardiac catheterization 2016.  Continue aspirin  3. Hypertension: Blood pressure stable  4. Hyperlipidemia: Intolerant of statins  5. Hypothyroidism: Managed by primary care provider  6. Thoracic aortic aneurysm: CT of the chest obtained on 09/05/2019 showed ascending aorta measuring at 41 mm  7. Fatigue: I do not have a clear explanation for her progressive fatigue.  No sign of anemia.  Will focus on controlling her blood pressure  8. Emphysema: Managed by pulmonology service.   Medication Adjustments/Labs and Tests Ordered: Current medicines are reviewed at length with the patient today.  Concerns regarding medicines are outlined above.  Orders Placed This Encounter  Procedures   EKG 12-Lead   Meds ordered this encounter  Medications   amLODipine (NORVASC) 5 MG tablet    Sig: Take 1 tablet (5 mg total) by mouth daily.    Dispense:  90 tablet    Refill:  3    Patient Instructions  Medication Instructions:   INCREASE Amlodipine to 5  mg daily  *If you need a refill on your cardiac medications before your next appointment, please call your pharmacy*  Lab Work: NONE ordered at this time of appointment   If you have labs (blood work) drawn today and your tests are completely normal, you will receive your results only by:  [facility] (if you have MyChart) OR  A paper copy in the mail If you have any lab test that is abnormal or we need to change your treatment, we will call you to review the results.  Testing/Procedures: NONE ordered at this time of appointment   Follow-Up: At [facility], you and your health needs are our priority.  As part of our continuing mission to provide you with exceptional heart care, we have created designated Provider Care Teams.  These Care Teams include your primary Cardiologist (physician) and Advanced Practice Providers (APPs -  Physician Assistants and Nurse Practitioners) who all work together to provide you with the care you need, when you need it.  Your next appointment:   As scheduled 10/28/19 at 9:40 AM   The format for your next appointment:   In Person  Provider:   Kirk Ruths, MD  Other Instructions      Signed, Almyra Deforest, [place]  09/18/2019 12:00 AM    [place]

## 2019-09-17 ENCOUNTER — Encounter: Payer: Self-pay | Admitting: Physician Assistant

## 2019-09-19 ENCOUNTER — Other Ambulatory Visit: Payer: Self-pay | Admitting: Physician Assistant

## 2019-09-19 ENCOUNTER — Other Ambulatory Visit: Payer: Self-pay | Admitting: Family Medicine

## 2019-09-19 DIAGNOSIS — M791 Myalgia, unspecified site: Secondary | ICD-10-CM

## 2019-09-19 DIAGNOSIS — M159 Polyosteoarthritis, unspecified: Secondary | ICD-10-CM

## 2019-09-19 DIAGNOSIS — F4323 Adjustment disorder with mixed anxiety and depressed mood: Secondary | ICD-10-CM

## 2019-09-19 DIAGNOSIS — I10 Essential (primary) hypertension: Secondary | ICD-10-CM

## 2019-09-19 DIAGNOSIS — R5382 Chronic fatigue, unspecified: Secondary | ICD-10-CM

## 2019-09-19 DIAGNOSIS — K219 Gastro-esophageal reflux disease without esophagitis: Secondary | ICD-10-CM

## 2019-09-19 DIAGNOSIS — G894 Chronic pain syndrome: Secondary | ICD-10-CM

## 2019-09-30 ENCOUNTER — Telehealth: Payer: Self-pay | Admitting: Physician Assistant

## 2019-09-30 NOTE — Telephone Encounter (Signed)
Advised pt that it is too early to refill her medications at this time.  Advised pt that if she does not have enough of her meds to last until refills are due approximately 10/25/2019, then she needs to call her pharmacy back and follow up with them.  Pt expressed understanding and is agreeable.  Charyl Bigger, CMA

## 2019-09-30 NOTE — Telephone Encounter (Signed)
Patient is requesting a refill of her meloxicam, duloxetine, amitriptyline, omeprazole, and spironolactone. If approved please send to [facility]

## 2019-10-01 ENCOUNTER — Telehealth: Payer: Self-pay | Admitting: Physician Assistant

## 2019-10-01 NOTE — Telephone Encounter (Signed)
[place] (spoke with [facility]) and confirmed that medications were shipped to the pt on 07/30/2019 for 90 day supply.  Advised pt of this information.  Pt stated that [employer] told her she would need refills soon.  Advised pt that since she should not need these medications until approximately 10/25/2019 we do not send additional refills until approximately 10 day prior to appropriate refill date.  Pt expressed understanding.  Charyl Bigger, CMA

## 2019-10-01 NOTE — Telephone Encounter (Signed)
Patient called in stating to speak with Amy Hodge about her medication prescriptions expiring. Thanks

## 2019-10-07 ENCOUNTER — Other Ambulatory Visit: Payer: Self-pay | Admitting: Physician Assistant

## 2019-10-07 DIAGNOSIS — M159 Polyosteoarthritis, unspecified: Secondary | ICD-10-CM

## 2019-10-07 DIAGNOSIS — J209 Acute bronchitis, unspecified: Secondary | ICD-10-CM | POA: Diagnosis not present

## 2019-10-07 DIAGNOSIS — R05 Cough: Secondary | ICD-10-CM | POA: Diagnosis not present

## 2019-10-07 DIAGNOSIS — Z1152 Encounter for screening for COVID-19: Secondary | ICD-10-CM | POA: Diagnosis not present

## 2019-10-07 DIAGNOSIS — E890 Postprocedural hypothyroidism: Secondary | ICD-10-CM

## 2019-10-10 ENCOUNTER — Other Ambulatory Visit: Payer: Self-pay | Admitting: Physician Assistant

## 2019-10-10 MED ORDER — HYDROCODONE-HOMATROPINE 5-1.5 MG/5ML PO SYRP: 5.0000 mL | ORAL_SOLUTION | Freq: Three times a day (TID) | ORAL | 0 refills | Status: DC | PRN

## 2019-10-10 NOTE — Telephone Encounter (Signed)
Patient called Monday to try and be seen for a URI, we did not have anything available so she went to UC and tested neg for COVID. The provider prescibed her tessalon perles but she says these don't ever help when she has a nagging cough and that Dr. Jenetta Downer had prescribed her Tussionex in the past which helped. She is wondering if [name] could call in a dose for her to help with this cough. If approved please send to [facility].

## 2019-10-10 NOTE — Telephone Encounter (Signed)
Patient is aware medication being sent to [facility] per [place]. AS, CMA

## 2019-10-10 NOTE — Addendum Note (Signed)
Addended by: Mickel Crow on: 10/10/2019 04:03 PM   Modules accepted: Orders

## 2019-10-10 NOTE — Telephone Encounter (Signed)
Please review and advise. AS< CMA 

## 2019-10-21 NOTE — Progress Notes (Signed)
HPI: FUCAD. Previously followed by Dr Einar Gip. Pt had NSTEMI 3/50 with complicated PCI of RCA (performed in [facility]). ABI 12/15 normal.Cath 6/16 showed 100 PDA, patent stents in proximal and mid RCA, no obstructive disease in the left system; collaterals to the PDA noted; PTCA of CTO unsuccessful.Last cath 10/17 showed 100 PDA (filled with left to right collaterals), patent RCA stent and normal LV function. H/O cough with ACEI. Myalgias with crestor.Dr Martinique reviewed and did not think attempt at CTO intervention or CABG was appropriate.  Neck CTA June 2021 showed no significant carotid stenosis.  CTA July 2021 showed 41 mm ascending aorta, suggestion of pulmonary hypertension, and heterogeneous enhancement of the liver.  Most recent echocardiogram July 2021 showed normal LV function, mild left ventricular hypertrophy, grade 1 diastolic dysfunction, hypokinesis of the lateral wall, trace aortic insufficiency and mildly dilated ascending aorta at 42 mm. Since last seen,patient continues to have chest pain.  It is substernal radiating to her left upper extremity.  It can occur both with exertion and at rest.  Lasts for minutes and resolve spontaneously.  She has had this chronically since her previous infarct.  She has some dyspnea on exertion.  No syncope.  Current Outpatient Medications  Medication Sig Dispense Refill   acetaminophen (TYLENOL) 500 MG tablet Take 1,000 mg by mouth every 8 (eight) hours as needed (for pain or headaches).      albuterol (VENTOLIN HFA) 108 (90 Base) MCG/ACT inhaler Inhale 1-2 puffs into the lungs every 6 (six) hours as needed for wheezing or shortness of breath.     amitriptyline (ELAVIL) 25 MG tablet TAKE 2 TABLETS (50 MG TOTAL) BY MOUTH AT BEDTIME. 180 tablet 0   amLODipine (NORVASC) 5 MG tablet Take 1 tablet (5 mg total) by mouth daily. 90 tablet 3   amoxicillin-clavulanate (AUGMENTIN) 875-125 MG tablet Take 1 tablet by mouth 2 (two) times daily. 20 tablet 0    aspirin EC 81 MG tablet Take 81 mg by mouth daily at 6 PM.      B Complex Vitamins (VITAMIN B COMPLEX PO) Take 1 tablet by mouth daily.     buPROPion (WELLBUTRIN XL) 150 MG 24 hr tablet Take 1 tablet (150 mg total) by mouth daily. (Patient taking differently: Take 150 mg by mouth in the morning. ) 90 tablet 0   Cholecalciferol (VITAMIN D3) 5000 units CAPS Take 5,000 Units by mouth daily.      cyclobenzaprine (FLEXERIL) 10 MG tablet Take 1 tablet (10 mg total) by mouth 3 (three) times daily as needed for muscle spasms. 30 tablet 0   DULoxetine (CYMBALTA) 20 MG capsule TAKE 2 CAPSULES (40 MG TOTAL) BY MOUTH DAILY. 180 capsule 0   fexofenadine (ALLEGRA) 180 MG tablet Take 1 tablet (180 mg total) by mouth daily. 90 tablet 3   HYDROcodone-homatropine (HYCODAN) 5-1.5 MG/5ML syrup Take 5 mLs by mouth every 8 (eight) hours as needed for cough. 120 mL 0   levothyroxine (SYNTHROID) 175 MCG tablet TAKE 1 TABLET (175 MCG TOTAL) BY MOUTH DAILY BEFORE BREAKFAST. 90 tablet 0   meloxicam (MOBIC) 15 MG tablet TAKE 1 TABLET (15 MG TOTAL) BY MOUTH DAILY. 90 tablet 0   nitroGLYCERIN (NITROSTAT) 0.4 MG SL tablet Place 0.4 mg under the tongue every 5 (five) minutes as needed for chest pain.     omeprazole (PRILOSEC) 20 MG capsule TAKE 1 CAPSULE (20 MG TOTAL) BY MOUTH IN THE MORNING AND AT BEDTIME. 180 capsule 0   spironolactone (  ALDACTONE) 25 MG tablet TAKE 1 TABLET (25 MG TOTAL) BY MOUTH DAILY. 90 tablet 0   No current facility-administered medications for this visit.     Past Medical History:  Diagnosis Date   Allergy    Anxiety    COPD (chronic obstructive pulmonary disease) ([place])    Coronary artery disease    a. s/p PCI of RCA in 2015 b. cath in 12/2015 showed 100% Ost RPDA with collaterals present   DDD (degenerative disc disease), cervical    Depression    DJD (degenerative joint disease) of knee    RT   GERD (gastroesophageal reflux disease)    Hashimoto's disease     Headache(784.0)    Hyperlipidemia    Hypertension    Hypothyroidism    Myocardial infarction ([place])    Nocturnal hypoxemia 02/26/2015   On supplemental oxygen therapy    Oxygen 2.5 l/m at bedtime.   Stented coronary artery 02/26/2015    Past Surgical History:  Procedure Laterality Date   ABDOMINAL HYSTERECTOMY     APPENDECTOMY  1971   BUNIONECTOMY Right    CARDIAC CATHETERIZATION N/A 09/02/2014   Procedure: Left Heart Cath and Coronary Angiography;  Surgeon: Adrian Prows, MD;  Location: [place] [facility];  Service: Cardiovascular;  Laterality: N/A;   CARDIAC CATHETERIZATION N/A 12/15/2015   Procedure: Left Heart Cath and Coronary Angiography;  Surgeon: Jettie Booze, MD;  Location: [place] [facility];  Service: Cardiovascular;  Laterality: N/A;   CERVICAL FUSION     CESAREAN SECTION     COLONOSCOPY WITH PROPOFOL N/A 02/24/2015   Procedure: COLONOSCOPY WITH PROPOFOL;  Surgeon: Garlan Fair, MD;  Location: [facility];  Service: Endoscopy;  Laterality: N/A;   ESOPHAGOGASTRODUODENOSCOPY (EGD) WITH PROPOFOL N/A 02/24/2015   Procedure: ESOPHAGOGASTRODUODENOSCOPY (EGD) WITH PROPOFOL;  Surgeon: Garlan Fair, MD;  Location: [facility];  Service: Endoscopy;  Laterality: N/A;   HAND SURGERY     due to trauma-dog bite.   I & D EXTREMITY Bilateral 05/17/2013   Procedure: IRRIGATION AND DEBRIDEMENT EXTREMITY;  Surgeon: Roseanne Kaufman, MD;  Location: [place];  Service: Orthopedics;  Laterality: Bilateral;   LEFT HEART CATHETERIZATION WITH CORONARY ANGIOGRAM N/A 03/04/2014   Procedure: LEFT HEART CATHETERIZATION WITH CORONARY ANGIOGRAM;  Surgeon: Laverda Page, MD;  Location: [facility];  Service: Cardiovascular;  Laterality: N/A;   OOPHORECTOMY     SHOULDER SURGERY     RT   [place]   TOTAL ABDOMINAL HYSTERECTOMY W/ BILATERAL SALPINGOOPHORECTOMY  01/2008   with cervical dysplasia   TUBAL LIGATION  1984    Social  History   Socioeconomic History   Marital status: Married    Spouse name: Not on file   Number of children: 1   Years of education: Not on file   Highest education level: Not on file  Occupational History   Occupation: disabled    Employer: [employer]  Tobacco Use   Smoking status: Former Smoker    Packs/day: 2.00    Years: 25.00    Pack years: 50.00    Types: Cigarettes    Quit date: 04/23/1996    Years since quitting: 23.5   Smokeless tobacco: Never Used  Vaping Use   Vaping Use: Never used  Substance and Sexual Activity   Alcohol use: No    Alcohol/week: 0.0 standard drinks   Drug use: No   Sexual activity: Not Currently  Other Topics Concern   Not on file  Social History Narrative   On worker's comp for lower back and right shoulder - applying for disability; losing cobra insurance 03/07/2010   Social Determinants of Health   Financial Resource Strain:    Difficulty of Paying Living Expenses: Not on file  Food Insecurity:    Worried About Charity fundraiser in the Last Year: Not on file   [employer] of Food in the Last Year: Not on file  Transportation Needs:    Lack of Transportation (Medical): Not on file   Lack of Transportation (Non-Medical): Not on file  Physical Activity:    Days of Exercise per Week: Not on file   Minutes of Exercise per Session: Not on file  Stress:    Feeling of Stress : Not on file  Social Connections:    Frequency of Communication with Friends and Family: Not on file   Frequency of Social Gatherings with Friends and Family: Not on file   Attends Religious Services: Not on file   Active Member of Clubs or Organizations: Not on file   Attends Archivist Meetings: Not on file   Marital Status: Not on file  Intimate Partner Violence:    Fear of Current or Ex-Partner: Not on file   Emotionally Abused: Not on file   Physically Abused: Not on file   Sexually Abused: Not on file    Family History   Problem Relation Age of Onset   Lung cancer Mother    Hypertension Mother    Stroke Mother    Diabetes Mother    Diabetes Father    Hypertension Father    Heart attack Father    Asthma Father    COPD Father    Alcohol abuse Father    Diabetes Sister    Hypertension Sister     ROS: no fevers or chills, productive cough, hemoptysis, dysphasia, odynophagia, melena, hematochezia, dysuria, hematuria, rash, seizure activity, orthopnea, PND, pedal edema, claudication. Remaining systems are negative.  Physical Exam: Well-developed well-nourished in no acute distress.  Skin is warm and dry.  HEENT is normal.  Neck is supple.  Chest is clear to auscultation with normal expansion.  Cardiovascular exam is regular rate and rhythm.  Abdominal exam nontender or distended. No masses palpated. Extremities show no edema. neuro grossly intact  ECG-September 16, 2019-sinus rhythm, cannot rule out prior inferior infarct.  Personally reviewed  A/P  1 chest pain-as outlined above PCI of chronically occluded PDA was not felt to be a good option and medical therapy recommended.  Her chest pain has been chronic.  We will continue with medical therapy with calcium blocker.  She did not tolerate beta-blockade, nitrates or Ranexa in the past.  2 dilated ascending aorta-plan follow-up CTA July 2022.  3 coronary artery disease-continue aspirin.  She is intolerant to statins.  4 hyperlipidemia-intolerant to statins and Zetia.  She discontinued Repatha previously due to expense.  We will reevaluate to see if either Repatha or Praluent would be affordable.  5 hypertension-patient's blood pressure is elevated.  Increase amlodipine to 10 mg daily both for blood pressure and as antianginal.  Follow blood pressure and adjust regimen as needed.  Kirk Ruths, MD

## 2019-10-24 ENCOUNTER — Encounter: Payer: Self-pay | Admitting: Physician Assistant

## 2019-10-24 ENCOUNTER — Other Ambulatory Visit: Payer: Self-pay

## 2019-10-24 ENCOUNTER — Ambulatory Visit (INDEPENDENT_AMBULATORY_CARE_PROVIDER_SITE_OTHER): Payer: Medicare HMO | Admitting: Physician Assistant

## 2019-10-24 ENCOUNTER — Ambulatory Visit
Admission: RE | Admit: 2019-10-24 | Discharge: 2019-10-24 | Disposition: A | Discharge status: Home / Self Care | Payer: Medicare HMO | Source: Ambulatory Visit | Attending: Physician Assistant | Admitting: Physician Assistant

## 2019-10-24 VITALS — BP 138/86 | HR 87 | Temp 98.4°F | Ht 65.0 in | Wt 198.7 lb

## 2019-10-24 DIAGNOSIS — J441 Chronic obstructive pulmonary disease with (acute) exacerbation: Secondary | ICD-10-CM

## 2019-10-24 DIAGNOSIS — E785 Hyperlipidemia, unspecified: Secondary | ICD-10-CM | POA: Diagnosis not present

## 2019-10-24 DIAGNOSIS — F4323 Adjustment disorder with mixed anxiety and depressed mood: Secondary | ICD-10-CM | POA: Diagnosis not present

## 2019-10-24 DIAGNOSIS — R0602 Shortness of breath: Secondary | ICD-10-CM | POA: Diagnosis not present

## 2019-10-24 DIAGNOSIS — E890 Postprocedural hypothyroidism: Secondary | ICD-10-CM

## 2019-10-24 DIAGNOSIS — I10 Essential (primary) hypertension: Secondary | ICD-10-CM | POA: Diagnosis not present

## 2019-10-24 MED ORDER — AMOXICILLIN-POT CLAVULANATE 875-125 MG PO TABS: 1.0000 | ORAL_TABLET | Freq: Two times a day (BID) | ORAL | 0 refills | Status: DC

## 2019-10-24 MED ORDER — METHYLPREDNISOLONE SODIUM SUCC 125 MG IJ SOLR
125.0000 mg | Freq: Once | INTRAMUSCULAR | Status: AC
  Administered 2019-10-24: 125 mg via INTRAMUSCULAR

## 2019-10-24 NOTE — Patient Instructions (Signed)

## 2019-10-24 NOTE — Progress Notes (Signed)
Established Patient Office Visit  Subjective:  Patient ID: Amy Hodge, female    DOB: 09/02/1955  Age: 64 y.o. MRN: [id number]  CC:  Chief Complaint  Patient presents with  . Hypertension  . Hyperlipidemia  . Thyroid Problem  . Depression    HPI Amy Hodge presents for follow-up on hypertension, hyperlipidemia, hypothyroid and mood. Pt has acute concerns of productive cough with thick sputum, chest congestion, and shortness of breath for several weeks. Pt was evaluated by urgent care in [place] and was diagnosed with URI/sinus infection. She was also tested for Covid-19 which resulted negative. She completed Zpack and prednisone taper, which has been >1 wk ago. She has an upcoming appointment with pulmonology. States she cannot tolerate albuterol because it causes cardiac symptoms.  HTN: Pt denies new onset chest pain, palpitations, dizziness or leg swelling. Taking medication as directed without side effects. Reports has cardiology appointment 08/23.  HLD: Followed by cardiology. Pt is intolerant to statin therapy. Repatha injections were declined by insurance. She will see cardiology in a few weeks.   Hypothyroid: States she continues to feel tired. Denies heat/cold intolerance, weight or bowel changes.  Mood: States she feels good. Denies SI/HI. Reports medication compliance.  Past Medical History:  Diagnosis Date  . Allergy   . Anxiety   . COPD (chronic obstructive pulmonary disease) ([place])   . Coronary artery disease    a. s/p PCI of RCA in 2015 b. cath in 12/2015 showed 100% Ost RPDA with collaterals present  . DDD (degenerative disc disease), cervical   . Depression   . DJD (degenerative joint disease) of knee    RT  . GERD (gastroesophageal reflux disease)   . Hashimoto's disease   . Headache(784.0)   . Hyperlipidemia   . Hypertension   . Hypothyroidism   . Myocardial infarction ([place])   . Nocturnal hypoxemia 02/26/2015  . On supplemental oxygen  therapy    Oxygen 2.5 l/m at bedtime.  . Stented coronary artery 02/26/2015    Past Surgical History:  Procedure Laterality Date  . ABDOMINAL HYSTERECTOMY    . APPENDECTOMY  1971  . BUNIONECTOMY Right   . CARDIAC CATHETERIZATION N/A 09/02/2014   Procedure: Left Heart Cath and Coronary Angiography;  Surgeon: Adrian Prows, MD;  Location: [place] [facility];  Service: Cardiovascular;  Laterality: N/A;  . CARDIAC CATHETERIZATION N/A 12/15/2015   Procedure: Left Heart Cath and Coronary Angiography;  Surgeon: Jettie Booze, MD;  Location: [place] [facility];  Service: Cardiovascular;  Laterality: N/A;  . CERVICAL FUSION    . CESAREAN SECTION    . COLONOSCOPY WITH PROPOFOL N/A 02/24/2015   Procedure: COLONOSCOPY WITH PROPOFOL;  Surgeon: Garlan Fair, MD;  [facility];  Service: Endoscopy;  Laterality: N/A;  . ESOPHAGOGASTRODUODENOSCOPY (EGD) WITH PROPOFOL N/A 02/24/2015   Procedure: ESOPHAGOGASTRODUODENOSCOPY (EGD) WITH PROPOFOL;  Surgeon: Garlan Fair, MD;  [facility];  Service: Endoscopy;  Laterality: N/A;  . HAND SURGERY     due to trauma-dog bite.  . I & D EXTREMITY Bilateral 05/17/2013   Procedure: IRRIGATION AND DEBRIDEMENT EXTREMITY;  Surgeon: Roseanne Kaufman, MD;  Location: [facility];  Service: Orthopedics;  Laterality: Bilateral;  . LEFT HEART CATHETERIZATION WITH CORONARY ANGIOGRAM N/A 03/04/2014   Procedure: LEFT HEART CATHETERIZATION WITH CORONARY ANGIOGRAM;  Surgeon: Laverda Page, MD;  Location: [facility];  Service: Cardiovascular;  Laterality: N/A;  . OOPHORECTOMY    . SHOULDER SURGERY     RT  .  THYROIDECTOMY  1996  . TONSILLECTOMY  1965  . TOTAL ABDOMINAL HYSTERECTOMY W/ BILATERAL SALPINGOOPHORECTOMY  01/2008   with cervical dysplasia  . TUBAL LIGATION  1984    Family History  Problem Relation Age of Onset  . Lung cancer Mother   . Hypertension Mother   . Stroke Mother   . Diabetes Mother   . Diabetes Father   . Hypertension Father    . Heart attack Father   . Asthma Father   . COPD Father   . Alcohol abuse Father   . Diabetes Sister   . Hypertension Sister     Social History   Socioeconomic History  . Marital status: Married    Spouse name: Not on file  . Number of children: 1  . Years of education: Not on file  . Highest education level: Not on file  Occupational History  . Occupation: disabled    Employer: [employer]  Tobacco Use  . Smoking status: Former Smoker    Packs/day: 2.00    Years: 25.00    Pack years: 50.00    Types: Cigarettes    Quit date: 04/23/1996    Years since quitting: 23.5  . Smokeless tobacco: Never Used  Vaping Use  . Vaping Use: Never used  Substance and Sexual Activity  . Alcohol use: No    Alcohol/week: 0.0 standard drinks  . Drug use: No  . Sexual activity: Not Currently  Other Topics Concern  . Not on file  Social History Narrative   On worker's comp for lower back and right shoulder - applying for disability; losing cobra insurance 03/07/2010   Social Determinants of Health   Financial Resource Strain:   . Difficulty of Paying Living Expenses: Not on file  Food Insecurity:   . Worried About Charity fundraiser in the Last Year: Not on file  . Ran Out of Food in the Last Year: Not on file  Transportation Needs:   . Lack of Transportation (Medical): Not on file  . Lack of Transportation (Non-Medical): Not on file  Physical Activity:   . Days of Exercise per Week: Not on file  . Minutes of Exercise per Session: Not on file  Stress:   . Feeling of Stress : Not on file  Social Connections:   . Frequency of Communication with Friends and Family: Not on file  . Frequency of Social Gatherings with Friends and Family: Not on file  . Attends Religious Services: Not on file  . Active Member of Clubs or Organizations: Not on file  . Attends Archivist Meetings: Not on file  . Marital Status: Not on file  Intimate Partner Violence:   . Fear of Current or  Ex-Partner: Not on file  . Emotionally Abused: Not on file  . Physically Abused: Not on file  . Sexually Abused: Not on file    Outpatient Medications Prior to Visit  Medication Sig Dispense Refill  . acetaminophen (TYLENOL) 500 MG tablet Take 1,000 mg by mouth every 8 (eight) hours as needed (for pain or headaches).     Marland Kitchen albuterol (VENTOLIN HFA) 108 (90 Base) MCG/ACT inhaler Inhale 1-2 puffs into the lungs every 6 (six) hours as needed for wheezing or shortness of breath.    Marland Kitchen amitriptyline (ELAVIL) 25 MG tablet TAKE 2 TABLETS (50 MG TOTAL) BY MOUTH AT BEDTIME. 180 tablet 0  . aspirin EC 81 MG tablet Take 81 mg by mouth daily at 6 PM.     .  B Complex Vitamins (VITAMIN B COMPLEX PO) Take 1 tablet by mouth daily.    Marland Kitchen buPROPion (WELLBUTRIN XL) 150 MG 24 hr tablet Take 1 tablet (150 mg total) by mouth daily. (Patient taking differently: Take 150 mg by mouth in the morning. ) 90 tablet 0  . Cholecalciferol (VITAMIN D3) 5000 units CAPS Take 5,000 Units by mouth daily.     . cyclobenzaprine (FLEXERIL) 10 MG tablet Take 1 tablet (10 mg total) by mouth 3 (three) times daily as needed for muscle spasms. 30 tablet 0  . DULoxetine (CYMBALTA) 20 MG capsule TAKE 2 CAPSULES (40 MG TOTAL) BY MOUTH DAILY. 180 capsule 0  . fexofenadine (ALLEGRA) 180 MG tablet Take 1 tablet (180 mg total) by mouth daily. 90 tablet 3  . levothyroxine (SYNTHROID) 175 MCG tablet TAKE 1 TABLET (175 MCG TOTAL) BY MOUTH DAILY BEFORE BREAKFAST. 90 tablet 0  . meloxicam (MOBIC) 15 MG tablet TAKE 1 TABLET (15 MG TOTAL) BY MOUTH DAILY. 90 tablet 0  . nitroGLYCERIN (NITROSTAT) 0.4 MG SL tablet Place 0.4 mg under the tongue every 5 (five) minutes as needed for chest pain.    Marland Kitchen omeprazole (PRILOSEC) 20 MG capsule TAKE 1 CAPSULE (20 MG TOTAL) BY MOUTH IN THE MORNING AND AT BEDTIME. 180 capsule 0  . spironolactone (ALDACTONE) 25 MG tablet TAKE 1 TABLET (25 MG TOTAL) BY MOUTH DAILY. 90 tablet 0  . amLODipine (NORVASC) 5 MG tablet Take 1  tablet (5 mg total) by mouth daily. 90 tablet 3  . HYDROcodone-homatropine (HYCODAN) 5-1.5 MG/5ML syrup Take 5 mLs by mouth every 8 (eight) hours as needed for cough. 120 mL 0   No facility-administered medications prior to visit.    Allergies  Allergen Reactions  . Bactrim [Sulfamethoxazole-Trimethoprim] Shortness Of Breath, Anxiety and Palpitations  . Ketorolac Shortness Of Breath  . Anoro Ellipta [Umeclidinium-Vilanterol] Other (See Comments)    Per patient, caused painful blisters in her mouth.   . Morphine Other (See Comments)    Headache   . Tetracyclines & Related Itching  . Valsartan Other (See Comments) and Cough    Headaches also   . Bystolic [Nebivolol Hcl] Other (See Comments)    Fatigue- Tired feeling  . Isosorbide Other (See Comments)    HEADACHES   . Levofloxacin Hives  . Lisinopril Cough  . Metoprolol Other (See Comments)    FATIGUE  . Morphine And Related Other (See Comments)    Headache   . Simvastatin Other (See Comments)    Makes legs hurt/ sore   . Singulair [Montelukast Sodium] Palpitations and Cough  . Statins Other (See Comments)    MYALGIAS  . Tetracycline Hives and Itching  . Toradol [Ketorolac Tromethamine] Palpitations    ROS Review of Systems  A fourteen system review of systems was performed and found to be positive as per HPI.  Objective:    Physical Exam General:  Well Developed, well nourished, appropriate for stated age.  Neuro:  Alert and oriented,  extra-ocular muscles intact, no focal deficits  HEENT:  Normocephalic, atraumatic, neck supple  Skin:  no gross rash, warm, pink. Cardiac:  RRR, S1 S2 Respiratory:  ECTA B/L with expiratory wheezing, no rhonchi/rales, Not using accessory muscles, speaking in full sentences- unlabored. Vascular:  Ext warm, no cyanosis apprec.; cap RF less 2 sec. Psych:  No HI/SI, judgement and insight good, Euthymic mood. Full Affect.   BP 138/86   Pulse 87   Temp 98.4 F (36.9 C) (Oral)   Ht  5' 5" (1.651 m)   Wt 198 lb 11.2 oz (90.1 kg)   SpO2 93%   BMI 33.07 kg/m  Wt Readings from Last 3 Encounters:  10/31/19 198 lb (89.8 kg)  10/28/19 201 lb 9.6 oz (91.4 kg)  10/24/19 198 lb 11.2 oz (90.1 kg)     Health Maintenance Due  Topic Date Due  . COVID-19 Vaccine (1) Never done  . INFLUENZA VACCINE  10/06/2019    There are no preventive care reminders to display for this patient.  Lab Results  Component Value Date   TSH 1.960 07/25/2019   Lab Results  Component Value Date   WBC 7.0 10/24/2019   HGB 13.5 10/24/2019   HCT 39.5 10/24/2019   MCV 88 10/24/2019   PLT 263 10/24/2019   Lab Results  Component Value Date   NA 140 10/24/2019   K 4.9 10/24/2019   CO2 24 10/24/2019   GLUCOSE 72 10/24/2019   BUN 15 10/24/2019   CREATININE 0.85 10/24/2019   BILITOT <0.2 10/24/2019   ALKPHOS 83 10/24/2019   AST 19 10/24/2019   ALT 11 10/24/2019   PROT 6.7 10/24/2019   ALBUMIN 4.5 10/24/2019   CALCIUM 9.0 10/24/2019   ANIONGAP 10 08/23/2019   GFR 66.63 11/05/2015   Lab Results  Component Value Date   CHOL 185 01/25/2019   Lab Results  Component Value Date   HDL 53 01/25/2019   Lab Results  Component Value Date   LDLCALC 112 (H) 01/25/2019   Lab Results  Component Value Date   TRIG 113 01/25/2019   Lab Results  Component Value Date   CHOLHDL 3.5 01/25/2019   Lab Results  Component Value Date   HGBA1C 5.4 01/25/2019      Assessment & Plan:   Problem List Items Addressed This Visit      Cardiovascular and Mediastinum   HTN, goal below 130/80   Relevant Orders   CBC w/Diff (Completed)   Comp Met (CMET) (Completed)     Respiratory   COPD with acute exacerbation (HCC) - Primary   Relevant Medications   amoxicillin-clavulanate (AUGMENTIN) 875-125 MG tablet   Other Relevant Orders   CBC w/Diff (Completed)   DG Chest 2 View (Completed)     Endocrine   post-operative Hypothyroidism (Chronic)     Other   Hyperlipidemia- intol to statins  (Chronic)   Adjustment disorder with mixed anxiety and depressed mood     COPD with acute exacerbation: -Given prolonged symptoms and expiratory wheezing on exam will treat with Solu-medrol injection today and start Augmentin x 10 days.  -Follow up with pulmonology as scheduled. -Will order CXR to evaluate for possible pneumonia. -Will order CBC w/ diff to evaluate for infection.  Hypertension: -BP stable -Continue current medication regimen. -Follow DASH diet. -Will monitor alongside cardiology. -Checking CMP for medication monitoring.  Hyperlipidemia: -Followed by cardiology. -Recommend to follow heart healthy diet. -Stay as active as possible.  Hypothyroidism: -Last TSH and free T4 wnl -Continue current medication regimen. -Will continue to monitor.  Adjustment disorder with mixed anxiety and depressed mood: -Stable, PHQ-9 score of 0 -Continue current medication regimen. -Will continue to monitor.   Meds ordered this encounter  Medications  . amoxicillin-clavulanate (AUGMENTIN) 875-125 MG tablet    Sig: Take 1 tablet by mouth 2 (two) times daily.    Dispense:  20 tablet    Refill:  0    Order Specific Question:   Supervising Provider    Answer:   Madilyn Fireman,  CATHERINE D [[id number]]  . methylPREDNISolone sodium succinate (SOLU-MEDROL) 125 mg/2 mL injection 125 mg    Follow-up: Return in about 3 months (around 01/24/2020) for HTN, HLD, COPD.   Note:  This note was prepared with assistance of Dragon voice recognition software. Occasional wrong-word or sound-a-like substitutions may have occurred due to the inherent limitations of voice recognition software.   Lorrene Reid, PA-C

## 2019-10-25 LAB — CBC WITH DIFFERENTIAL/PLATELET
Basophils Absolute: 0.1 10*3/uL (ref 0.0–0.2)
Basos: 1 %
EOS (ABSOLUTE): 0.2 10*3/uL (ref 0.0–0.4)
Eos: 3 %
Hematocrit: 39.5 % (ref 34.0–46.6)
Hemoglobin: 13.5 g/dL (ref 11.1–15.9)
Immature Grans (Abs): 0 10*3/uL (ref 0.0–0.1)
Immature Granulocytes: 1 %
Lymphocytes Absolute: 2.3 10*3/uL (ref 0.7–3.1)
Lymphs: 34 %
MCH: 30.1 pg (ref 26.6–33.0)
MCHC: 34.2 g/dL (ref 31.5–35.7)
MCV: 88 fL (ref 79–97)
Monocytes Absolute: 0.5 10*3/uL (ref 0.1–0.9)
Monocytes: 7 %
Neutrophils Absolute: 3.9 10*3/uL (ref 1.4–7.0)
Neutrophils: 54 %
Platelets: 263 10*3/uL (ref 150–450)
RBC: 4.49 x10E6/uL (ref 3.77–5.28)
RDW: 13.1 % (ref 11.7–15.4)
WBC: 7 10*3/uL (ref 3.4–10.8)

## 2019-10-25 LAB — COMPREHENSIVE METABOLIC PANEL
ALT: 11 IU/L (ref 0–32)
AST: 19 IU/L (ref 0–40)
Albumin/Globulin Ratio: 2 (ref 1.2–2.2)
Albumin: 4.5 g/dL (ref 3.8–4.8)
Alkaline Phosphatase: 83 IU/L (ref 48–121)
BUN/Creatinine Ratio: 18 (ref 12–28)
BUN: 15 mg/dL (ref 8–27)
Bilirubin Total: <0.2 mg/dL (ref 0.0–1.2)
CO2: 24 mmol/L (ref 20–29)
Calcium: 9 mg/dL (ref 8.7–10.3)
Chloride: 103 mmol/L (ref 96–106)
Creatinine, Ser: 0.85 mg/dL (ref 0.57–1.00)
GFR calc Af Amer: 86 mL/min/{1.73_m2} (ref >59)
GFR calc non Af Amer: 74 mL/min/{1.73_m2} (ref >59)
Globulin, Total: 2.2 g/dL (ref 1.5–4.5)
Glucose: 72 mg/dL (ref 65–99)
Potassium: 4.9 mmol/L (ref 3.5–5.2)
Sodium: 140 mmol/L (ref 134–144)
Total Protein: 6.7 g/dL (ref 6.0–8.5)

## 2019-10-28 ENCOUNTER — Other Ambulatory Visit: Payer: Self-pay

## 2019-10-28 ENCOUNTER — Encounter: Payer: Self-pay | Admitting: Cardiology

## 2019-10-28 ENCOUNTER — Ambulatory Visit: Payer: Medicare HMO | Admitting: Cardiology

## 2019-10-28 VITALS — BP 132/98 | HR 91 | Temp 96.1°F | Ht 65.0 in | Wt 201.6 lb

## 2019-10-28 DIAGNOSIS — I712 Thoracic aortic aneurysm, without rupture, unspecified: Secondary | ICD-10-CM

## 2019-10-28 DIAGNOSIS — I25119 Atherosclerotic heart disease of native coronary artery with unspecified angina pectoris: Secondary | ICD-10-CM | POA: Diagnosis not present

## 2019-10-28 DIAGNOSIS — R072 Precordial pain: Secondary | ICD-10-CM

## 2019-10-28 DIAGNOSIS — I10 Essential (primary) hypertension: Secondary | ICD-10-CM

## 2019-10-28 DIAGNOSIS — E7800 Pure hypercholesterolemia, unspecified: Secondary | ICD-10-CM | POA: Diagnosis not present

## 2019-10-28 MED ORDER — REPATHA SURECLICK 140 MG/ML ~~LOC~~ SOAJ: 140.0000 mg | SUBCUTANEOUS | 11 refills | Status: DC

## 2019-10-28 MED ORDER — AMLODIPINE BESYLATE 10 MG PO TABS: 10.0000 mg | ORAL_TABLET | Freq: Every day | ORAL | 3 refills | Status: DC

## 2019-10-28 NOTE — Patient Instructions (Signed)
Medication Instructions:   INCREASE AMLODIPINE TO 10 MG ONCE DAILY= 2 OF THE 5 MG TABLETS ONCE DAILY  *If you need a refill on your cardiac medications before your next appointment, please call your pharmacy*   Lab Work: If you have labs (blood work) drawn today and your tests are completely normal, you will receive your results only by: Marland Kitchen MyChart Message (if you have MyChart) OR . A paper copy in the mail If you have any lab test that is abnormal or we need to change your treatment, we will call you to review the results.   Follow-Up: At [facility], you and your health needs are our priority.  As part of our continuing mission to provide you with exceptional heart care, we have created designated Provider Care Teams.  These Care Teams include your primary Cardiologist (physician) and Advanced Practice Providers (APPs -  Physician Assistants and Nurse Practitioners) who all work together to provide you with the care you need, when you need it.  We recommend signing up for the patient portal called "MyChart".  Sign up information is provided on this After Visit Summary.  MyChart is used to connect with patients for Virtual Visits (Telemedicine).  Patients are able to view lab/test results, encounter notes, upcoming appointments, etc.  Non-urgent messages can be sent to your provider as well.   To learn more about what you can do with MyChart, go to [email or web address].    Your next appointment:   6 month(s)  The format for your next appointment:   In Person  Provider:   You may see Kirk Ruths, MD or one of the following Advanced Practice Providers on your designated Care Team:    Kerin Ransom, PA-C  [place], [place]  Coletta Memos, [place]    Other Instructions FOLLOW UP WITH PHARM MD REGARDING CHOLESTEROL MEDICATIONS.

## 2019-10-31 ENCOUNTER — Other Ambulatory Visit: Payer: Self-pay

## 2019-10-31 ENCOUNTER — Ambulatory Visit: Payer: Medicare HMO | Admitting: Internal Medicine

## 2019-10-31 ENCOUNTER — Encounter: Payer: Self-pay | Admitting: Internal Medicine

## 2019-10-31 VITALS — BP 108/70 | HR 92 | Temp 97.7°F | Ht 65.0 in | Wt 198.0 lb

## 2019-10-31 DIAGNOSIS — I2729 Other secondary pulmonary hypertension: Secondary | ICD-10-CM | POA: Diagnosis not present

## 2019-10-31 DIAGNOSIS — J441 Chronic obstructive pulmonary disease with (acute) exacerbation: Secondary | ICD-10-CM | POA: Diagnosis not present

## 2019-10-31 DIAGNOSIS — G4734 Idiopathic sleep related nonobstructive alveolar hypoventilation: Secondary | ICD-10-CM

## 2019-10-31 MED ORDER — BREZTRI AEROSPHERE 160-9-4.8 MCG/ACT IN AERO
2.0000 | INHALATION_SPRAY | Freq: Two times a day (BID) | RESPIRATORY_TRACT | 0 refills | Status: DC
Start: 2019-10-31 — End: 2020-04-28

## 2019-10-31 NOTE — Patient Instructions (Signed)
Order- overnight oximetry on room air     Dx pulmonary hypertension  Finish your augmentin antibiotic  Sample x 2 Breztri inhaler     Inhale 2 puffs then rinse mouth, twice daily See if this helps your breathing  I do strongly recommend you get the Covid vaccine

## 2019-10-31 NOTE — Progress Notes (Signed)
HPI female former smoker followed for COPD mixed type, hemoptysis, complicated by CAD/ MI Z1IR WNL MM 138 6MWT-06/23/2014-94%, 93%, 99%, 452 m on room air. No oxygen limitation. PFT-06/23/14- moderate obstructive airways disease with insignificant response to bronchodilator, normal lung capacity, diffusion mildly reduced. FVC 2.64/70%, FEV1 1.92/65%, FEV1/FVC 0.72 ONOX-04/30/14- 314 minutes with oxygen saturation less than or equal to 88% on room air, qualifying for home O2 during sleep ECHO 05/16/13 EF 60-65%, GR1 diast dysfunction HST- 07/16/15- AHI 6/ hr, desaturation to 82%, body weight 186 lbs ---------------------------------------------------------------------------------------------------  10/25/2018- 64 year old female former smoker followed for COPD mixed type, hemoptysis, complicated by CAD/ MI, RUL nodule -----pt reports dry productive cough for 1 week; recently tested negative for COVID-19 WTW till Sept for flu vax Describes "tired" x 3 weeks, fever 100 deg and transient diarrhea 1 week ago, cough change, increased, dry or scant foamy. Persistent DOE with her COPD. Covid swab at [facility] 4 days ago, reported neg yesterday. Uses her husband's albuterol hfa occ ("free") Otherwise doesn't feel sick. Daily allegra, but doesn't expect much seasonal allergy now. Not wheezing. Perles don't help cough. CTaChest Aorta 08/31/18- IMPRESSION: 1. Stable mild fusiform aneurysmal dilatation of the ascending thoracic aorta with a maximal transverse diameter of 4.0 cm. Recommend annual imaging followup by CTA or MRA. This recommendation follows 2010 ACCF/AHA/AATS/ACR/ASA/SCA/SCAI/SIR/STS/SVM Guidelines for the Diagnosis and Management of Patients with Thoracic Aortic Disease. Circulation. 2010; 121: C789-F810. Aortic aneurysm NOS (ICD10-I71.9) 2. Stable mild enlargement of the main pulmonary artery as can be seen in the setting of pulmonary arterial hypertension. 3. Moderate centrilobular pulmonary  emphysema. Stable appearance of the liver which is diffusely heterogeneous on arterial phase imaging. Similar findings have been noted dating back to 2017. 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 Aortic Atherosclerosis (ICD10-I70.0) and Emphysema (ICD10-J43.9).  10/31/19- 64 year old female former smoker followed for COPD mixed type, hemoptysis, complicated by CAD/ MI, RUL nodule, PAH. -----worsening cough, yellow mucus sometimes, shortness of breath for the past month augmentin started 8/19, Ventolin hfa,  Onset 1 month ago of exacerbation URI/ bronchitis. PCP initially gave Zpak, then augmentin/ prednisone taper. Few doses of augmentin left. Tested neg Covid. Reluctant Covax- discussed- although brother-in-law dies of Covid.  Gradually better. No HA or fever now. Tessalon no help cough so PCP gave ?hycodan. Only has Ventolin inhaler.  Asked about PAH suggested on CXR. ECHO in July "mild PAH". She will discuss w cardiology at next appt. She had O2 for sleep in past, dc'd in 20i17 at her request  Will update ONOX. CXR 10/25/19-  IMPRESSION: No acute cardiopulmonary abnormality. Chronic hyperinflation with some coarsened interstitial changes compatible with history of COPD. Central pulmonary arterial enlargement compatible with pulmonary artery hypertension. Mild chronic scarring in the left mid lung and lung bases. Aortic Atherosclerosis (ICD10-I70.0). Prominent aorta better evaluated on cross-sectional imaging.   ROS-see HPI      + = positive Constitutional:   No-   weight loss, night sweats, fevers, chills, fatigue, lassitude. HEENT:   + headaches, difficulty swallowing, tooth/dental problems, sore throat,       No-  sneezing, itching, ear ache, +nasal congestion, post nasal drip,  CV:  +chest pain, orthopnea, PND, swelling in lower extremities, anasarca,  dizziness, +palpitations Resp: +shortness of breath with exertion or at rest.                productive cough,  + non-productive cough,   coughing up of blood.               change in color of mucus.  wheezing.   Skin: No-   rash or lesions. GI:  + heartburn, indigestion, abdominal pain, nausea, vomiting,  GU:  MS:  +  joint pain or swelling.   Neuro-     nothing unusual Psych:  No- change in mood or affect. No depression or anxiety.  No memory loss.  OBJ- Physical Exam General- Alert, Oriented, Affect-appropriate, Distress- none acute Skin- rash-none, lesions- none, excoriation- none Lymphadenopathy- none Head- atraumatic            Eyes- Gross vision intact, PERRLA, conjunctivae and secretions clear            Ears- Hearing, canals-normal            Nose- Clear, no-Septal dev, mucus, polyps, erosion, perforation             Throat- Mallampati II , mucosa clear , drainage- none, tonsils- atrophic,                    + dentures Neck- flexible , trachea midline, no stridor , thyroid nl, carotid no bruit Chest - symmetrical excursion , unlabored           Heart/CV- RRR , no murmur , no gallop  , no rub, nl s1 s2                           - JVD- none , edema- none, stasis changes- none, varices- none           Lung- clear to P&A, wheeze- none, cough-none , dullness-none, rub- none           Chest wall-  Abd-  Br/ Gen/ Rectal- Not done, not indicated Extrem- cyanosis- none, clubbing, none, atrophy- none, strength- nl Neuro- grossly intact to observation

## 2019-10-31 NOTE — Assessment & Plan Note (Signed)
She had o2 for sleep in past, dc'd in 2017 when she felt better and wanted to stop O2. Now with mild PAH on Echo, plan repeat ONOX

## 2019-10-31 NOTE — Assessment & Plan Note (Signed)
Finishing augmentin Plan- add samples Breztri for trial. Strongly encouraged Covid vaccine

## 2019-11-05 ENCOUNTER — Encounter: Payer: Self-pay | Admitting: Internal Medicine

## 2019-11-07 DIAGNOSIS — I270 Primary pulmonary hypertension: Secondary | ICD-10-CM | POA: Diagnosis not present

## 2019-11-12 ENCOUNTER — Other Ambulatory Visit: Payer: Self-pay | Admitting: Internal Medicine

## 2019-11-12 DIAGNOSIS — G4734 Idiopathic sleep related nonobstructive alveolar hypoventilation: Secondary | ICD-10-CM

## 2019-11-12 DIAGNOSIS — G4733 Obstructive sleep apnea (adult) (pediatric): Secondary | ICD-10-CM

## 2019-11-12 MED ORDER — HYDROCODONE-HOMATROPINE 5-1.5 MG/5ML PO SYRP: 5.0000 mL | ORAL_SOLUTION | Freq: Three times a day (TID) | ORAL | 0 refills | Status: DC | PRN

## 2019-11-12 NOTE — Telephone Encounter (Signed)
I have refilled the hydrocodone cough syrup we had on file to [facility].Note that her file says she iss allergic to this. Please clarify and correct allergy list as appropriate.

## 2019-11-12 NOTE — Telephone Encounter (Signed)
Dr Annamaria Boots- pt given samples of Bretzri at the last visit and emailed the following:  Please let Dr Annamaria Boots know that I've had to stop taking the samples he gave me. I had horrible shakes and a cough. Stopped the meds the shakes went away but the cough remains just not as bad. If there's anything else he might suggest I'm open for trying to stop this cough. Thank You   Please advise, thanks

## 2019-11-12 NOTE — Telephone Encounter (Signed)
Dr Annamaria Boots- pt sent another email before we could respond back to her. Will let her know your recommendations, but can you please advise on this as well, thanks   One more thing would you ask him if he'll call in a prescription for hydrocodone cough meds. If he will please call it in to [facility]. Thank you

## 2019-11-12 NOTE — Telephone Encounter (Signed)
1) Sorry Amy Hodge not tolerated- we will remove it from med list and mark as "intolerance"  2) Offer 2 samples Spiriva Respimat   2.5    Inhale 2 puffs once daily  3) Overnight oximetry test on 8/31 showed persistently low oxygen level, but also strongly suggested she may have sleep apnea.              Please order a sleep study- either Split night, or HST    for dx OSA, Nocturnal Hypoxemia

## 2019-11-13 MED ORDER — SPIRIVA RESPIMAT 2.5 MCG/ACT IN AERS
2.0000 | INHALATION_SPRAY | Freq: Every day | RESPIRATORY_TRACT | 0 refills | Status: DC
Start: 2019-11-13 — End: 2020-04-28

## 2019-11-13 NOTE — Telephone Encounter (Signed)
Called and spoke with patient to let her know that we were going to be order a HST and that [facility] would call to get her set up. She states that she does fine with the cough syrup she thinks that since there is a little amount of medication in it she doesn't react to it. She is also interested in trying the Spiriva 2.5. Offered to leave samples up from of her to come and pick up and try. She can come and pick then up tomorrow.   Dr. Annamaria Boots would you like for me to put samples up front for her?

## 2019-11-13 NOTE — Telephone Encounter (Signed)
Yes please leave Spriva 2.5 samples up front

## 2019-11-13 NOTE — Addendum Note (Signed)
Addended by: Lia Foyer R on: 11/13/2019 03:48 PM   Modules accepted: Orders

## 2019-11-13 NOTE — Addendum Note (Signed)
Addended by: Lia Foyer R on: 11/13/2019 02:27 PM   Modules accepted: Orders

## 2019-11-13 NOTE — Telephone Encounter (Signed)
Samples placed upfront. Patient is aware. Nothing further needed at this time.

## 2019-11-14 DIAGNOSIS — U071 COVID-19: Secondary | ICD-10-CM | POA: Diagnosis not present

## 2019-11-14 NOTE — Telephone Encounter (Signed)
Patient has tested positive for Covid, asking if she can continue with medications. She just started Spiriva yesterday and is on Hycodan. Please advise.

## 2019-11-14 NOTE — Telephone Encounter (Signed)
Sorry for the Covid infection. Fine to continue with breathing meds. They won't hurt and may help. Hope you feel better soon.

## 2019-11-16 ENCOUNTER — Telehealth: Payer: Self-pay | Admitting: Oncology

## 2019-11-16 ENCOUNTER — Other Ambulatory Visit: Payer: Self-pay | Admitting: Oncology

## 2019-11-16 ENCOUNTER — Encounter: Payer: Self-pay | Admitting: Oncology

## 2019-11-16 DIAGNOSIS — U071 COVID-19: Secondary | ICD-10-CM

## 2019-11-16 NOTE — Telephone Encounter (Signed)
I connected by phone with  to discuss the potential use of an new treatment for mild to moderate COVID-19 viral infection in non-hospitalized patients.   This patient is a age/sex that meets the FDA criteria for Emergency Use Authorization of casirivimab\imdevimab.  Has a (+) direct SARS-CoV-2 viral test result 1. Has mild or moderate COVID-19  2. Is ? 62 years of age and weighs ? 40 kg 3. Is NOT hospitalized due to COVID-19 4. Is NOT requiring oxygen therapy or requiring an increase in baseline oxygen flow rate due to COVID-19 5. Is within 10 days of symptom onset 6. Has at least one of the high risk factor(s) for progression to severe COVID-19 and/or hospitalization as defined in EUA. ? Specific high risk criteria : age, obese   Symptom onset  11/11/2019   I have spoken and communicated the following to the patient or parent/caregiver:   1. FDA has authorized the emergency use of casirivimab\imdevimab for the treatment of mild to moderate COVID-19 in adults and pediatric patients with positive results of direct SARS-CoV-2 viral testing who are 69 years of age and older weighing at least 40 kg, and who are at high risk for progressing to severe COVID-19 and/or hospitalization.   2. The significant known and potential risks and benefits of casirivimab\imdevimab, and the extent to which such potential risks and benefits are unknown.   3. Information on available alternative treatments and the risks and benefits of those alternatives, including clinical trials.   4. Patients treated with casirivimab\imdevimab should continue to self-isolate and use infection control measures (e.g., wear mask, isolate, social distance, avoid sharing personal items, clean and disinfect "high touch" surfaces, and frequent handwashing) according to CDC guidelines.    5. The patient or parent/caregiver has the option to accept or refuse casirivimab\imdevimab .   After reviewing this information with the patient,  The patient agreed to proceed with receiving casirivimab\imdevimab infusion and will be provided a copy of the Fact sheet prior to receiving the infusion.Rulon Abide, AGNP-C [phone] ([place])

## 2019-11-17 ENCOUNTER — Ambulatory Visit (HOSPITAL_COMMUNITY)
Admission: RE | Admit: 2019-11-17 | Discharge: 2019-11-17 | Disposition: A | Discharge status: Home / Self Care | Payer: Medicare HMO | Source: Ambulatory Visit | Attending: Pulmonary Disease | Admitting: Pulmonary Disease

## 2019-11-17 ENCOUNTER — Other Ambulatory Visit (HOSPITAL_COMMUNITY): Payer: Self-pay

## 2019-11-17 DIAGNOSIS — Z23 Encounter for immunization: Secondary | ICD-10-CM | POA: Diagnosis not present

## 2019-11-17 DIAGNOSIS — U071 COVID-19: Secondary | ICD-10-CM | POA: Insufficient documentation

## 2019-11-17 MED ORDER — DIPHENHYDRAMINE HCL 50 MG/ML IJ SOLN: 50.0000 mg | Freq: Once | INTRAMUSCULAR | Status: DC | PRN

## 2019-11-17 MED ORDER — SODIUM CHLORIDE 0.9 % IV SOLN: INTRAVENOUS | Status: DC | PRN

## 2019-11-17 MED ORDER — FAMOTIDINE IN NACL 20-0.9 MG/50ML-% IV SOLN: 20.0000 mg | Freq: Once | INTRAVENOUS | Status: DC | PRN

## 2019-11-17 MED ORDER — METHYLPREDNISOLONE SODIUM SUCC 125 MG IJ SOLR: 125.0000 mg | Freq: Once | INTRAMUSCULAR | Status: DC | PRN

## 2019-11-17 MED ORDER — EPINEPHRINE 0.3 MG/0.3ML IJ SOAJ: 0.3000 mg | Freq: Once | INTRAMUSCULAR | Status: DC | PRN

## 2019-11-17 MED ORDER — ALBUTEROL SULFATE HFA 108 (90 BASE) MCG/ACT IN AERS: 2.0000 | INHALATION_SPRAY | Freq: Once | RESPIRATORY_TRACT | Status: DC | PRN

## 2019-11-17 MED ORDER — SODIUM CHLORIDE 0.9 % IV SOLN
1200.0000 mg | Freq: Once | INTRAVENOUS | Status: AC
  Administered 2019-11-17: 1200 mg via INTRAVENOUS
  Filled 2019-11-17: qty 10

## 2019-11-17 NOTE — Progress Notes (Signed)
  Diagnosis: COVID-19  Physician: Joya Gaskins   Procedure: Covid Infusion Clinic Med: casirivimab\imdevimab infusion - Provided patient with casirivimab\imdevimab fact sheet for patients, parents and caregivers prior to infusion.  Complications: No immediate complications noted.  Discharge: Discharged home   Chyrel Masson 11/17/2019

## 2019-11-17 NOTE — Discharge Instructions (Signed)

## 2019-11-18 ENCOUNTER — Encounter: Payer: Self-pay | Admitting: Physician Assistant

## 2019-11-18 ENCOUNTER — Other Ambulatory Visit (HOSPITAL_COMMUNITY): Payer: Self-pay

## 2019-11-19 NOTE — Telephone Encounter (Signed)
Called patient who states her oxygen levels were in the 80%s yesterday but they have improved and are at 91%.  I advised patient her oxygen level resting at 91% is still not good. I advised patient that when she moves around or lays down to go to sleep her oxygen levels will drop again and that prolonged low oxygen level can cause brain damage and other serious issues.  I advised patient to call 911 and  go to ER for emergent care.   Patient verbalized understanding and said she would have her husband drive her to the ER. AS, CMA

## 2019-11-21 ENCOUNTER — Ambulatory Visit: Payer: Medicare HMO

## 2019-11-25 ENCOUNTER — Telehealth: Payer: Self-pay | Admitting: Physician Assistant

## 2019-11-25 MED ORDER — HYDROXYZINE HCL 25 MG PO TABS: ORAL_TABLET | ORAL | 0 refills | Status: DC

## 2019-11-25 NOTE — Telephone Encounter (Signed)
Pts husband Amy Hodge (who is also a patient with our clinic) as been admitted into the ICU for COVID and per wife isnt doing well. She said her nerves are shot and she is very worried for him. She is hoping that [name] will understand and prescribe an acute supply of meds for the anxiety during this time. Please advise if this is something we can do.

## 2019-11-25 NOTE — Addendum Note (Signed)
Addended by: Mickel Crow on: 11/25/2019 11:07 AM   Modules accepted: Orders

## 2019-11-25 NOTE — Telephone Encounter (Signed)
Verbal order from [facility] for Hydroxyzine 25mg  1-2 tabs every 6-8 hrs prn.   Medication sent to requested pharmacy.   Patient is aware and verbalized understanding. AS, CMA

## 2019-12-05 ENCOUNTER — Ambulatory Visit: Payer: Medicare HMO

## 2019-12-05 ENCOUNTER — Other Ambulatory Visit: Payer: Self-pay

## 2019-12-06 ENCOUNTER — Telehealth: Payer: Self-pay | Admitting: Physician Assistant

## 2019-12-06 NOTE — Telephone Encounter (Signed)
Called to check on patient per [employer]'s request. Patient states that she is doing slightly better since starting the prednisone, she is not as SOB as the previous days but still has the heavy lingering cough. Oxygen level was 92 when placed on right hand, 95 on left.

## 2019-12-06 NOTE — Telephone Encounter (Signed)
Noted. [name] is aware. AS, CMA

## 2019-12-09 NOTE — Telephone Encounter (Signed)
Agree with you Lauren- please order overnight oximetry   Dx nocturnal hypoxemia

## 2019-12-09 NOTE — Telephone Encounter (Signed)
Dr. Annamaria Boots patient sent email   Was wondering if I could try some oxygen at night to see if itll bring my oxygen levels back to normal. Since Ive had Covid they struggle to stay near 95. Of course Id have to see if my insurance would cover it   I explained to her we would have to see oxygen drop below 88% before insurance will approve it, but that I would also send the message to Dr. Annamaria Boots for recommendations and possibly ordering an ONO.   Dr. Annamaria Boots please advise.   Allergies  Allergen Reactions   Bactrim [Sulfamethoxazole-Trimethoprim] Shortness Of Breath, Anxiety and Palpitations   Ketorolac Shortness Of Breath   Anoro Ellipta [Umeclidinium-Vilanterol] Other (See Comments)    Per patient, caused painful blisters in her mouth.    Morphine Other (See Comments)    Headache    Tetracyclines & Related Itching   Valsartan Other (See Comments) and Cough    Headaches also    Bystolic [Nebivolol Hcl] Other (See Comments)    Fatigue- Tired feeling   Isosorbide Other (See Comments)    HEADACHES    Levofloxacin Hives   Lisinopril Cough   Metoprolol Other (See Comments)    FATIGUE   Morphine And Related Other (See Comments)    Headache    Simvastatin Other (See Comments)    Makes legs hurt/ sore    Singulair [Montelukast Sodium] Palpitations and Cough   Statins Other (See Comments)    MYALGIAS   Tetracycline Hives and Itching   Toradol [Ketorolac Tromethamine] Palpitations   Current Outpatient Medications on File Prior to Visit  Medication Sig Dispense Refill   acetaminophen (TYLENOL) 500 MG tablet Take 1,000 mg by mouth every 8 (eight) hours as needed (for pain or headaches).      albuterol (VENTOLIN HFA) 108 (90 Base) MCG/ACT inhaler Inhale 1-2 puffs into the lungs every 6 (six) hours as needed for wheezing or shortness of breath.     amitriptyline (ELAVIL) 25 MG tablet TAKE 2 TABLETS (50 MG TOTAL) BY MOUTH AT BEDTIME. 180 tablet 0   amLODipine (NORVASC) 10  MG tablet Take 1 tablet (10 mg total) by mouth daily. 90 tablet 3   amoxicillin-clavulanate (AUGMENTIN) 875-125 MG tablet Take 1 tablet by mouth 2 (two) times daily. 20 tablet 0   aspirin EC 81 MG tablet Take 81 mg by mouth daily at 6 PM.      B Complex Vitamins (VITAMIN B COMPLEX PO) Take 1 tablet by mouth daily.     Budeson-Glycopyrrol-Formoterol (BREZTRI AEROSPHERE) 160-9-4.8 MCG/ACT AERO Inhale 2 puffs into the lungs 2 (two) times daily. 5.9 g 0   buPROPion (WELLBUTRIN XL) 150 MG 24 hr tablet Take 1 tablet (150 mg total) by mouth daily. (Patient taking differently: Take 150 mg by mouth in the morning. ) 90 tablet 0   Cholecalciferol (VITAMIN D3) 5000 units CAPS Take 5,000 Units by mouth daily.      cyclobenzaprine (FLEXERIL) 10 MG tablet Take 1 tablet (10 mg total) by mouth 3 (three) times daily as needed for muscle spasms. 30 tablet 0   DULoxetine (CYMBALTA) 20 MG capsule TAKE 2 CAPSULES (40 MG TOTAL) BY MOUTH DAILY. 180 capsule 0   Evolocumab (REPATHA SURECLICK) 481 MG/ML SOAJ Inject 140 mg into the skin every 14 (fourteen) days. 2 mL 11   fexofenadine (ALLEGRA) 180 MG tablet Take 1 tablet (180 mg total) by mouth daily. 90 tablet 3   HYDROcodone-homatropine (HYCODAN) 5-1.5 MG/5ML syrup Take 5 mLs  by mouth every 8 (eight) hours as needed for cough. 240 mL 0   hydrOXYzine (ATARAX/VISTARIL) 25 MG tablet 1-2 tabs by mouth every 6-8 hrs PRN anxiety 60 tablet 0   levothyroxine (SYNTHROID) 175 MCG tablet TAKE 1 TABLET (175 MCG TOTAL) BY MOUTH DAILY BEFORE BREAKFAST. 90 tablet 0   meloxicam (MOBIC) 15 MG tablet TAKE 1 TABLET (15 MG TOTAL) BY MOUTH DAILY. 90 tablet 0   nitroGLYCERIN (NITROSTAT) 0.4 MG SL tablet Place 0.4 mg under the tongue every 5 (five) minutes as needed for chest pain.     omeprazole (PRILOSEC) 20 MG capsule TAKE 1 CAPSULE (20 MG TOTAL) BY MOUTH IN THE MORNING AND AT BEDTIME. 180 capsule 0   spironolactone (ALDACTONE) 25 MG tablet TAKE 1 TABLET (25 MG TOTAL) BY  MOUTH DAILY. 90 tablet 0   Tiotropium Bromide Monohydrate (SPIRIVA RESPIMAT) 2.5 MCG/ACT AERS Inhale 2 puffs into the lungs daily. 4 g 0   No current facility-administered medications on file prior to visit.

## 2019-12-09 NOTE — Telephone Encounter (Signed)
Pt stating ONO already done- looks like we ordered this- [employer] in late Aug 2021 Will call to get the results after [employer] opens 10/5

## 2019-12-10 ENCOUNTER — Encounter: Payer: Self-pay | Admitting: Physician Assistant

## 2019-12-10 ENCOUNTER — Other Ambulatory Visit: Payer: Self-pay

## 2019-12-10 ENCOUNTER — Ambulatory Visit (INDEPENDENT_AMBULATORY_CARE_PROVIDER_SITE_OTHER): Payer: Medicare HMO | Admitting: Physician Assistant

## 2019-12-10 VITALS — BP 125/80 | HR 98 | Ht 65.0 in | Wt 191.5 lb

## 2019-12-10 DIAGNOSIS — R0600 Dyspnea, unspecified: Secondary | ICD-10-CM | POA: Diagnosis not present

## 2019-12-10 DIAGNOSIS — F4321 Adjustment disorder with depressed mood: Secondary | ICD-10-CM

## 2019-12-10 DIAGNOSIS — F4323 Adjustment disorder with mixed anxiety and depressed mood: Secondary | ICD-10-CM | POA: Diagnosis not present

## 2019-12-10 DIAGNOSIS — F320 Major depressive disorder, single episode, mild: Secondary | ICD-10-CM | POA: Diagnosis not present

## 2019-12-10 DIAGNOSIS — Z8616 Personal history of COVID-19: Secondary | ICD-10-CM

## 2019-12-10 DIAGNOSIS — G4700 Insomnia, unspecified: Secondary | ICD-10-CM

## 2019-12-10 MED ORDER — HYDROXYZINE HCL 25 MG PO TABS: ORAL_TABLET | ORAL | 0 refills | Status: DC

## 2019-12-10 MED ORDER — AMITRIPTYLINE HCL 75 MG PO TABS: 75.0000 mg | ORAL_TABLET | Freq: Every day | ORAL | 0 refills | Status: DC

## 2019-12-10 NOTE — Progress Notes (Signed)
Acute Office Visit  Subjective:    Patient ID: Amy Hodge, female    DOB: 1955-08-22, 64 y.o.   MRN: [id number]  No chief complaint on file.   HPI Patient is in today for shortness of breath. Patient is post covid-19 infection and reports she recently completed prednisone taper that was prescribed by the hospital which has helped with shortness of breath. She has been monitoring her oxygen saturation at home, which has improved as well. Patient's husband passed away 2 weeks ago from SIRS related to Covid-19. Patient reports she tries to stay busy and has a good support system at home. She is coping and grieving the best she can. Reports she is having trouble with insomnia. She takes 50 mg of amitriptyline for mood and to help with sleep.  Past Medical History:  Diagnosis Date  . Allergy   . Anxiety   . COPD (chronic obstructive pulmonary disease) ([place])   . Coronary artery disease    a. s/p PCI of RCA in 2015 b. cath in 12/2015 showed 100% Ost RPDA with collaterals present  . DDD (degenerative disc disease), cervical   . Depression   . DJD (degenerative joint disease) of knee    RT  . GERD (gastroesophageal reflux disease)   . Hashimoto's disease   . Headache(784.0)   . Hyperlipidemia   . Hypertension   . Hypothyroidism   . Myocardial infarction ([place])   . Nocturnal hypoxemia 02/26/2015  . On supplemental oxygen therapy    Oxygen 2.5 l/m at bedtime.  . Stented coronary artery 02/26/2015    Past Surgical History:  Procedure Laterality Date  . ABDOMINAL HYSTERECTOMY    . APPENDECTOMY  1971  . BUNIONECTOMY Right   . CARDIAC CATHETERIZATION N/A 09/02/2014   Procedure: Left Heart Cath and Coronary Angiography;  Surgeon: Adrian Prows, MD;  Location: [facility];  Service: Cardiovascular;  Laterality: N/A;  . CARDIAC CATHETERIZATION N/A 12/15/2015   Procedure: Left Heart Cath and Coronary Angiography;  Surgeon: Jettie Booze, MD;  Location: [facility];   Service: Cardiovascular;  Laterality: N/A;  . CERVICAL FUSION    . CESAREAN SECTION    . COLONOSCOPY WITH PROPOFOL N/A 02/24/2015   Procedure: COLONOSCOPY WITH PROPOFOL;  Surgeon: Garlan Fair, MD;  Location: [facility];  Service: Endoscopy;  Laterality: N/A;  . ESOPHAGOGASTRODUODENOSCOPY (EGD) WITH PROPOFOL N/A 02/24/2015   Procedure: ESOPHAGOGASTRODUODENOSCOPY (EGD) WITH PROPOFOL;  Surgeon: Garlan Fair, MD;  Location: [facility];  Service: Endoscopy;  Laterality: N/A;  . HAND SURGERY     due to trauma-dog bite.  . I & D EXTREMITY Bilateral 05/17/2013   Procedure: IRRIGATION AND DEBRIDEMENT EXTREMITY;  Surgeon: Roseanne Kaufman, MD;  Location: [facility];  Service: Orthopedics;  Laterality: Bilateral;  . LEFT HEART CATHETERIZATION WITH CORONARY ANGIOGRAM N/A 03/04/2014   Procedure: LEFT HEART CATHETERIZATION WITH CORONARY ANGIOGRAM;  Surgeon: Laverda Page, MD;  Location: [facility];  Service: Cardiovascular;  Laterality: N/A;  . OOPHORECTOMY    . SHOULDER SURGERY     RT  . THYROIDECTOMY  1996  . TONSILLECTOMY  1965  . TOTAL ABDOMINAL HYSTERECTOMY W/ BILATERAL SALPINGOOPHORECTOMY  01/2008   with cervical dysplasia  . TUBAL LIGATION  1984    Family History  Problem Relation Age of Onset  . Lung cancer Mother   . Hypertension Mother   . Stroke Mother   . Diabetes Mother   . Diabetes Father   . Hypertension  Father   . Heart attack Father   . Asthma Father   . COPD Father   . Alcohol abuse Father   . Diabetes Sister   . Hypertension Sister     Social History   Socioeconomic History  . Marital status: Married    Spouse name: Not on file  . Number of children: 1  . Years of education: Not on file  . Highest education level: Not on file  Occupational History  . Occupation: disabled    Employer: [employer]  Tobacco Use  . Smoking status: Former Smoker    Packs/day: 2.00    Years: 25.00    Pack years: 50.00    Types: Cigarettes    Quit date: 04/23/1996     Years since quitting: 23.6  . Smokeless tobacco: Never Used  Vaping Use  . Vaping Use: Never used  Substance and Sexual Activity  . Alcohol use: No    Alcohol/week: 0.0 standard drinks  . Drug use: No  . Sexual activity: Not Currently  Other Topics Concern  . Not on file  Social History Narrative   On worker's comp for lower back and right shoulder - applying for disability; losing [employer] insurance 03/07/2010   Social Determinants of Health   Financial Resource Strain:   . Difficulty of Paying Living Expenses: Not on file  Food Insecurity:   . Worried About Charity fundraiser in the Last Year: Not on file  . Ran Out of Food in the Last Year: Not on file  Transportation Needs:   . Lack of Transportation (Medical): Not on file  . Lack of Transportation (Non-Medical): Not on file  Physical Activity:   . Days of Exercise per Week: Not on file  . Minutes of Exercise per Session: Not on file  Stress:   . Feeling of Stress : Not on file  Social Connections:   . Frequency of Communication with Friends and Family: Not on file  . Frequency of Social Gatherings with Friends and Family: Not on file  . Attends Religious Services: Not on file  . Active Member of Clubs or Organizations: Not on file  . Attends Archivist Meetings: Not on file  . Marital Status: Not on file  Intimate Partner Violence:   . Fear of Current or Ex-Partner: Not on file  . Emotionally Abused: Not on file  . Physically Abused: Not on file  . Sexually Abused: Not on file    Outpatient Medications Prior to Visit  Medication Sig Dispense Refill  . acetaminophen (TYLENOL) 500 MG tablet Take 1,000 mg by mouth every 8 (eight) hours as needed (for pain or headaches).     Marland Kitchen albuterol (VENTOLIN HFA) 108 (90 Base) MCG/ACT inhaler Inhale 1-2 puffs into the lungs every 6 (six) hours as needed for wheezing or shortness of breath.    Marland Kitchen amLODipine (NORVASC) 10 MG tablet Take 1 tablet (10 mg total) by mouth daily.  90 tablet 3  . amoxicillin-clavulanate (AUGMENTIN) 875-125 MG tablet Take 1 tablet by mouth 2 (two) times daily. 20 tablet 0  . aspirin EC 81 MG tablet Take 81 mg by mouth daily at 6 PM.     . B Complex Vitamins (VITAMIN B COMPLEX PO) Take 1 tablet by mouth daily.    . Budeson-Glycopyrrol-Formoterol (BREZTRI AEROSPHERE) 160-9-4.8 MCG/ACT AERO Inhale 2 puffs into the lungs 2 (two) times daily. 5.9 g 0  . buPROPion (WELLBUTRIN XL) 150 MG 24 hr tablet Take 1 tablet (150  mg total) by mouth daily. (Patient taking differently: Take 150 mg by mouth in the morning. ) 90 tablet 0  . Cholecalciferol (VITAMIN D3) 5000 units CAPS Take 5,000 Units by mouth daily.     . cyclobenzaprine (FLEXERIL) 10 MG tablet Take 1 tablet (10 mg total) by mouth 3 (three) times daily as needed for muscle spasms. 30 tablet 0  . DULoxetine (CYMBALTA) 20 MG capsule TAKE 2 CAPSULES (40 MG TOTAL) BY MOUTH DAILY. 180 capsule 0  . Evolocumab (REPATHA SURECLICK) 528 MG/ML SOAJ Inject 140 mg into the skin every 14 (fourteen) days. 2 mL 11  . fexofenadine (ALLEGRA) 180 MG tablet Take 1 tablet (180 mg total) by mouth daily. 90 tablet 3  . HYDROcodone-homatropine (HYCODAN) 5-1.5 MG/5ML syrup Take 5 mLs by mouth every 8 (eight) hours as needed for cough. 240 mL 0  . levothyroxine (SYNTHROID) 175 MCG tablet TAKE 1 TABLET (175 MCG TOTAL) BY MOUTH DAILY BEFORE BREAKFAST. 90 tablet 0  . meloxicam (MOBIC) 15 MG tablet TAKE 1 TABLET (15 MG TOTAL) BY MOUTH DAILY. 90 tablet 0  . nitroGLYCERIN (NITROSTAT) 0.4 MG SL tablet Place 0.4 mg under the tongue every 5 (five) minutes as needed for chest pain.    Marland Kitchen omeprazole (PRILOSEC) 20 MG capsule TAKE 1 CAPSULE (20 MG TOTAL) BY MOUTH IN THE MORNING AND AT BEDTIME. 180 capsule 0  . spironolactone (ALDACTONE) 25 MG tablet TAKE 1 TABLET (25 MG TOTAL) BY MOUTH DAILY. 90 tablet 0  . Tiotropium Bromide Monohydrate (SPIRIVA RESPIMAT) 2.5 MCG/ACT AERS Inhale 2 puffs into the lungs daily. 4 g 0  . amitriptyline  (ELAVIL) 25 MG tablet TAKE 2 TABLETS (50 MG TOTAL) BY MOUTH AT BEDTIME. 180 tablet 0  . hydrOXYzine (ATARAX/VISTARIL) 25 MG tablet 1-2 tabs by mouth every 6-8 hrs PRN anxiety 60 tablet 0   No facility-administered medications prior to visit.    Allergies  Allergen Reactions  . Bactrim [Sulfamethoxazole-Trimethoprim] Shortness Of Breath, Anxiety and Palpitations  . Ketorolac Shortness Of Breath  . Anoro Ellipta [Umeclidinium-Vilanterol] Other (See Comments)    Per patient, caused painful blisters in her mouth.   . Morphine Other (See Comments)    Headache   . Tetracyclines & Related Itching  . Valsartan Other (See Comments) and Cough    Headaches also   . Bystolic [Nebivolol Hcl] Other (See Comments)    Fatigue- Tired feeling  . Isosorbide Other (See Comments)    HEADACHES   . Levofloxacin Hives  . Lisinopril Cough  . Metoprolol Other (See Comments)    FATIGUE  . Morphine And Related Other (See Comments)    Headache   . Simvastatin Other (See Comments)    Makes legs hurt/ sore   . Singulair [Montelukast Sodium] Palpitations and Cough  . Statins Other (See Comments)    MYALGIAS  . Tetracycline Hives and Itching  . Toradol [Ketorolac Tromethamine] Palpitations    Review of Systems A fourteen system review of systems was performed and found to be positive as per HPI.    Objective:    Physical Exam General:  Well Developed, well nourished, appropriate for stated age.  Neuro:  Alert and oriented,  extra-ocular muscles intact  HEENT:  Normocephalic, atraumatic, neck supple Skin:  no gross rash, warm, pink. Cardiac:  RRR, S1 S2 Respiratory:  ECTA B/L without wheezing, rhonchi or rales, Not using accessory muscles, speaking in full sentences- unlabored. Vascular:  Ext warm, no cyanosis apprec. Psych:  No HI/SI, judgement and insight good, normal  behavior. Full Affect.  BP 125/80   Pulse 98   Ht 5\' 5"  (1.651 m)   Wt 191 lb 8 oz (86.9 kg)   SpO2 98%   BMI 31.87 kg/m   Wt Readings from Last 3 Encounters:  12/10/19 191 lb 8 oz (86.9 kg)  10/31/19 198 lb (89.8 kg)  10/28/19 201 lb 9.6 oz (91.4 kg)    Health Maintenance Due  Topic Date Due  . COVID-19 Vaccine (1) Never done  . INFLUENZA VACCINE  10/06/2019    There are no preventive care reminders to display for this patient.   Lab Results  Component Value Date   TSH 1.960 07/25/2019   Lab Results  Component Value Date   WBC 7.0 10/24/2019   HGB 13.5 10/24/2019   HCT 39.5 10/24/2019   MCV 88 10/24/2019   PLT 263 10/24/2019   Lab Results  Component Value Date   NA 140 10/24/2019   K 4.9 10/24/2019   CO2 24 10/24/2019   GLUCOSE 72 10/24/2019   BUN 15 10/24/2019   CREATININE 0.85 10/24/2019   BILITOT <0.2 10/24/2019   ALKPHOS 83 10/24/2019   AST 19 10/24/2019   ALT 11 10/24/2019   PROT 6.7 10/24/2019   ALBUMIN 4.5 10/24/2019   CALCIUM 9.0 10/24/2019   ANIONGAP 10 08/23/2019   GFR 66.63 11/05/2015   Lab Results  Component Value Date   CHOL 185 01/25/2019   Lab Results  Component Value Date   HDL 53 01/25/2019   Lab Results  Component Value Date   LDLCALC 112 (H) 01/25/2019   Lab Results  Component Value Date   TRIG 113 01/25/2019   Lab Results  Component Value Date   CHOLHDL 3.5 01/25/2019   Lab Results  Component Value Date   HGBA1C 5.4 01/25/2019       Assessment & Plan:   Problem List Items Addressed This Visit      Other   Depression   Relevant Medications   hydrOXYzine (ATARAX/VISTARIL) 25 MG tablet   amitriptyline (ELAVIL) 75 MG tablet   Adjustment disorder with mixed anxiety and depressed mood - Primary   Relevant Medications   hydrOXYzine (ATARAX/VISTARIL) 25 MG tablet   amitriptyline (ELAVIL) 75 MG tablet    Other Visit Diagnoses    Grief reaction       Insomnia, unspecified type       Relevant Medications   amitriptyline (ELAVIL) 75 MG tablet   History of COVID-19       Dyspnea, unspecified type         Adjustment disorder with mixed  anxiety and depressed mood, Grief reaction, Insomnia: -Discussed with patient increasing amitriptyline to 75 mg to help with insomnia and mood. Patient is agreeable. -Continue hydroxyzine as needed for severe anxiety. Provided refill. -Encourage to use good support system and consider grief counseling. -Follow up as scheduled to reassess symptoms and medication therapy.  Current mild episode of MDD unspecified whether recurrent: -Stable, PHQ-9 score of 0 -Medication dose adjusted today. -Will continue to monitor.  History of COVID-19, Dyspnea: -Symptoms are improving and vital signs today are stable.  Patient's oxygen saturation is 94%, patient does have COPD and is followed by Pulmonology. -Discussed with patient post COVID-19 care and recovery. -Follow up if symptoms worsen or fail to improve.    Meds ordered this encounter  Medications  . hydrOXYzine (ATARAX/VISTARIL) 25 MG tablet    Sig: 1-2 tabs by mouth every 6-8 hrs PRN anxiety    Dispense:  60 tablet    Refill:  0  . amitriptyline (ELAVIL) 75 MG tablet    Sig: Take 1 tablet (75 mg total) by mouth at bedtime.    Dispense:  90 tablet    Refill:  0   Note:  This note was prepared with assistance of Dragon voice recognition software. Occasional wrong-word or sound-a-like substitutions may have occurred due to the inherent limitations of voice recognition software.   Lorrene Reid, PA-C

## 2019-12-10 NOTE — Telephone Encounter (Signed)
Called [employer] and left a message to follow up on this ONO report.

## 2019-12-30 ENCOUNTER — Other Ambulatory Visit: Payer: Self-pay | Admitting: Physician Assistant

## 2019-12-30 DIAGNOSIS — F4323 Adjustment disorder with mixed anxiety and depressed mood: Secondary | ICD-10-CM

## 2020-01-13 ENCOUNTER — Telehealth: Payer: Self-pay | Admitting: Internal Medicine

## 2020-01-21 ENCOUNTER — Encounter: Payer: Self-pay | Admitting: Physician Assistant

## 2020-01-21 ENCOUNTER — Ambulatory Visit (INDEPENDENT_AMBULATORY_CARE_PROVIDER_SITE_OTHER): Payer: Medicare HMO | Admitting: Physician Assistant

## 2020-01-21 ENCOUNTER — Other Ambulatory Visit: Payer: Self-pay

## 2020-01-21 VITALS — BP 113/76 | HR 84 | Ht 65.0 in | Wt 195.3 lb

## 2020-01-21 DIAGNOSIS — F4321 Adjustment disorder with depressed mood: Secondary | ICD-10-CM

## 2020-01-21 DIAGNOSIS — R3915 Urgency of urination: Secondary | ICD-10-CM

## 2020-01-21 DIAGNOSIS — M5440 Lumbago with sciatica, unspecified side: Secondary | ICD-10-CM

## 2020-01-21 DIAGNOSIS — Z23 Encounter for immunization: Secondary | ICD-10-CM

## 2020-01-21 DIAGNOSIS — E7800 Pure hypercholesterolemia, unspecified: Secondary | ICD-10-CM | POA: Diagnosis not present

## 2020-01-21 DIAGNOSIS — M5410 Radiculopathy, site unspecified: Secondary | ICD-10-CM

## 2020-01-21 DIAGNOSIS — Z0000 Encounter for general adult medical examination without abnormal findings: Secondary | ICD-10-CM | POA: Diagnosis not present

## 2020-01-21 DIAGNOSIS — J449 Chronic obstructive pulmonary disease, unspecified: Secondary | ICD-10-CM

## 2020-01-21 DIAGNOSIS — F4323 Adjustment disorder with mixed anxiety and depressed mood: Secondary | ICD-10-CM

## 2020-01-21 DIAGNOSIS — R300 Dysuria: Secondary | ICD-10-CM

## 2020-01-21 DIAGNOSIS — M62830 Muscle spasm of back: Secondary | ICD-10-CM | POA: Diagnosis not present

## 2020-01-21 DIAGNOSIS — I10 Essential (primary) hypertension: Secondary | ICD-10-CM

## 2020-01-21 DIAGNOSIS — G8929 Other chronic pain: Secondary | ICD-10-CM

## 2020-01-21 DIAGNOSIS — F4320 Adjustment disorder, unspecified: Secondary | ICD-10-CM

## 2020-01-21 DIAGNOSIS — M5441 Lumbago with sciatica, right side: Secondary | ICD-10-CM

## 2020-01-21 LAB — POCT URINALYSIS DIPSTICK
Bilirubin, UA: NEGATIVE
Blood, UA: NEGATIVE
Glucose, UA: NEGATIVE
Ketones, UA: NEGATIVE
Leukocytes, UA: NEGATIVE
Nitrite, UA: NEGATIVE
Protein, UA: NEGATIVE
Spec Grav, UA: 1.015 (ref 1.010–1.025)
Urobilinogen, UA: 0.2 E.U./dL
pH, UA: 5.5 (ref 5.0–8.0)

## 2020-01-21 MED ORDER — CYCLOBENZAPRINE HCL 10 MG PO TABS: 10.0000 mg | ORAL_TABLET | Freq: Three times a day (TID) | ORAL | 1 refills | Status: DC | PRN

## 2020-01-21 NOTE — Patient Instructions (Signed)
Managing Loss, Adult People experience loss in many different ways throughout their lives. Events such as moving, changing jobs, and losing friends can create a sense of loss. The loss may be as serious as a major health change, divorce, death of a pet, or death of a loved one. All of these types of loss are likely to create a physical and emotional reaction known as grief. Grief is the result of a major change or an absence of something or someone that you count on. Grief is a normal reaction to loss. A variety of factors can affect your grieving experience, including:  The nature of your loss.  Your relationship to what or whom you lost.  Your understanding of grief and how to manage it.  Your support system. How to manage lifestyle changes Keep to your normal routine as much as possible.  If you have trouble focusing or doing normal activities, it is acceptable to take some time away from your normal routine.  Spend time with friends and loved ones.  Eat a healthy diet, get plenty of sleep, and rest when you feel tired. How to recognize changes  The way that you deal with your grief will affect your ability to function as you normally do. When grieving, you may experience these changes:  Numbness, shock, sadness, anxiety, anger, denial, and guilt.  Thoughts about death.  Unexpected crying.  A physical sensation of emptiness in your stomach.  Problems sleeping and eating.  Tiredness (fatigue).  Loss of interest in normal activities.  Dreaming about or imagining seeing the person who died.  A need to remember what or whom you lost.  Difficulty thinking about anything other than your loss for a period of time.  Relief. If you have been expecting the loss for a while, you may feel a sense of relief when it happens. Follow these instructions at home:  Activity Express your feelings in healthy ways, such as:  Talking with others about your loss. It may be helpful to find  others who have had a similar loss, such as a support group.  Writing down your feelings in a journal.  Doing physical activities to release stress and emotional energy.  Doing creative activities like painting, sculpting, or playing or listening to music.  Practicing resilience. This is the ability to recover and adjust after facing challenges. Reading some resources that encourage resilience may help you to learn ways to practice those behaviors. General instructions  Be patient with yourself and others. Allow the grieving process to happen, and remember that grieving takes time. ? It is likely that you may never feel completely done with some grief. You may find a way to move on while still cherishing memories and feelings about your loss. ? Accepting your loss is a process. It can take months or longer to adjust.  Keep all follow-up visits as told by your health care provider. This is important. Where to find support To get support for managing loss:  Ask your health care provider for help and recommendations, such as grief counseling or therapy.  Think about joining a support group for people who are managing a loss. Where to find more information You can find more information about managing loss from:  American Society of Clinical Oncology: www.cancer.net  American Psychological Association: www.apa.org Contact a health care provider if:  Your grief is extreme and keeps getting worse.  You have ongoing grief that does not improve.  Your body shows symptoms of grief, such   as illness.  You feel depressed, anxious, or lonely. Get help right away if:  You have thoughts about hurting yourself or others. If you ever feel like you may hurt yourself or others, or have thoughts about taking your own life, get help right away. You can go to your nearest emergency department or call:  Your local emergency services (911 in the [place]  A suicide crisis helpline, such as the  National Suicide Prevention Lifeline at 1-800-273-8255. This is open 24 hours a day. Summary  Grief is the result of a major change or an absence of someone or something that you count on. Grief is a normal reaction to loss.  The depth of grief and the period of recovery depend on the type of loss and your ability to adjust to the change and process your feelings.  Processing grief requires patience and a willingness to accept your feelings and talk about your loss with people who are supportive.  It is important to find resources that work for you and to realize that people experience grief differently. There is not one grieving process that works for everyone in the same way.  Be aware that when grief becomes extreme, it can lead to more severe issues like isolation, depression, anxiety, or suicidal thoughts. Talk with your health care provider if you have any of these issues. This information is not intended to replace advice given to you by your health care provider. Make sure you discuss any questions you have with your health care provider. Document Revised: 04/27/2018 Document Reviewed: 07/07/2016 Elsevier Patient Education  2020 [employer]  

## 2020-01-21 NOTE — Progress Notes (Signed)
Established Patient Office Visit  Subjective:  Patient ID: Amy Hodge, female    DOB: 1955-09-23  Age: 64 y.o. MRN: [id number]  CC:  Chief Complaint  Patient presents with   Hypertension   Hyperlipidemia   COPD    HPI TENESHA Hodge presents for follow up on hypertension, hyperlipidemia and COPD. Has complaints of urinary urgency and chronic low back pain. Denies dysuria, fever or chills. Takes flexeril as needed to help relief some of the pain. States sometimes has pulsating pain that radiates down both legs, L>R.   HTN: Pt denies new onset chest pain, palpitations, dizziness or headache. Has experienced increased lower extremity swelling. Reports cardiologist increased amlodipine to 10 mg. Taking medication as directed without side effects. Reports good hydration.  HLD: Pt followed by Cardiology.   COPD: Followed by Pulmonology.  Grief: Continues to grief loss of husband. Reports is sleeping better because she is more relaxed with the increase of amitriptyline. She is considering grief counseling especially with holidays coming up. States has a good support system.  Past Medical History:  Diagnosis Date   Allergy    Anxiety    COPD (chronic obstructive pulmonary disease) ([place])    Coronary artery disease    a. s/p PCI of RCA in 2015 b. cath in 12/2015 showed 100% Ost RPDA with collaterals present   DDD (degenerative disc disease), cervical    Depression    DJD (degenerative joint disease) of knee    RT   GERD (gastroesophageal reflux disease)    Hashimoto's disease    Headache(784.0)    Hyperlipidemia    Hypertension    Hypothyroidism    Myocardial infarction ([place])    Nocturnal hypoxemia 02/26/2015   On supplemental oxygen therapy    Oxygen 2.5 l/m at bedtime.   Stented coronary artery 02/26/2015    Past Surgical History:  Procedure Laterality Date   ABDOMINAL HYSTERECTOMY     APPENDECTOMY  1971   BUNIONECTOMY Right    CARDIAC  CATHETERIZATION N/A 09/02/2014   Procedure: Left Heart Cath and Coronary Angiography;  Surgeon: Adrian Prows, MD;  Location: [facility] [facility];  Service: Cardiovascular;  Laterality: N/A;   CARDIAC CATHETERIZATION N/A 12/15/2015   Procedure: Left Heart Cath and Coronary Angiography;  Surgeon: Jettie Booze, MD;  Location: [facility];  Service: Cardiovascular;  Laterality: N/A;   CERVICAL FUSION     CESAREAN SECTION     COLONOSCOPY WITH PROPOFOL N/A 02/24/2015   Procedure: COLONOSCOPY WITH PROPOFOL;  Surgeon: Garlan Fair, MD;  Location: [facility];  Service: Endoscopy;  Laterality: N/A;   ESOPHAGOGASTRODUODENOSCOPY (EGD) WITH PROPOFOL N/A 02/24/2015   Procedure: ESOPHAGOGASTRODUODENOSCOPY (EGD) WITH PROPOFOL;  Surgeon: Garlan Fair, MD;  Location: [facility];  Service: Endoscopy;  Laterality: N/A;   HAND SURGERY     due to trauma-dog bite.   I & D EXTREMITY Bilateral 05/17/2013   Procedure: IRRIGATION AND DEBRIDEMENT EXTREMITY;  Surgeon: Roseanne Kaufman, MD;  Location: [facility];  Service: Orthopedics;  Laterality: Bilateral;   LEFT HEART CATHETERIZATION WITH CORONARY ANGIOGRAM N/A 03/04/2014   Procedure: LEFT HEART CATHETERIZATION WITH CORONARY ANGIOGRAM;  Surgeon: Laverda Page, MD;  Location: [facility];  Service: Cardiovascular;  Laterality: N/A;   OOPHORECTOMY     SHOULDER SURGERY     RT   [place]   TOTAL ABDOMINAL HYSTERECTOMY W/ BILATERAL SALPINGOOPHORECTOMY  01/2008   with cervical dysplasia   TUBAL LIGATION  1984    Family History  Problem Relation Age of Onset   Lung cancer Mother    Hypertension Mother    Stroke Mother    Diabetes Mother    Diabetes Father    Hypertension Father    Heart attack Father    Asthma Father    COPD Father    Alcohol abuse Father    Diabetes Sister    Hypertension Sister     Social History   Socioeconomic History   Marital status: Married    Spouse  name: Not on file   Number of children: 1   Years of education: Not on file   Highest education level: Not on file  Occupational History   Occupation: disabled    Employer: [employer]  Tobacco Use   Smoking status: Former Smoker    Packs/day: 2.00    Years: 25.00    Pack years: 50.00    Types: Cigarettes    Quit date: 04/23/1996    Years since quitting: 23.7   Smokeless tobacco: Never Used  Vaping Use   Vaping Use: Never used  Substance and Sexual Activity   Alcohol use: No    Alcohol/week: 0.0 standard drinks   Drug use: No   Sexual activity: Not Currently  Other Topics Concern   Not on file  Social History Narrative   On worker's comp for lower back and right shoulder - applying for disability; losing [employer] insurance 03/07/2010   Social Determinants of Health   Financial Resource Strain:    Difficulty of Paying Living Expenses: Not on file  Food Insecurity:    Worried About Charity fundraiser in the Last Year: Not on file   [employer] of Food in the Last Year: Not on file  Transportation Needs:    Lack of Transportation (Medical): Not on file   Lack of Transportation (Non-Medical): Not on file  Physical Activity:    Days of Exercise per Week: Not on file   Minutes of Exercise per Session: Not on file  Stress:    Feeling of Stress : Not on file  Social Connections:    Frequency of Communication with Friends and Family: Not on file   Frequency of Social Gatherings with Friends and Family: Not on file   Attends Religious Services: Not on file   Active Member of Clubs or Organizations: Not on file   Attends Archivist Meetings: Not on file   Marital Status: Not on file  Intimate Partner Violence:    Fear of Current or Ex-Partner: Not on file   Emotionally Abused: Not on file   Physically Abused: Not on file   Sexually Abused: Not on file    Outpatient Medications Prior to Visit  Medication Sig Dispense Refill    acetaminophen (TYLENOL) 500 MG tablet Take 1,000 mg by mouth every 8 (eight) hours as needed (for pain or headaches).      albuterol (VENTOLIN HFA) 108 (90 Base) MCG/ACT inhaler Inhale 1-2 puffs into the lungs every 6 (six) hours as needed for wheezing or shortness of breath.     amitriptyline (ELAVIL) 75 MG tablet Take 1 tablet (75 mg total) by mouth at bedtime. 90 tablet 0   amLODipine (NORVASC) 10 MG tablet Take 1 tablet (10 mg total) by mouth daily. 90 tablet 3   aspirin EC 81 MG tablet Take 81 mg by mouth daily at 6 PM.      B Complex Vitamins (VITAMIN B COMPLEX PO) Take  1 tablet by mouth daily.     Budeson-Glycopyrrol-Formoterol (BREZTRI AEROSPHERE) 160-9-4.8 MCG/ACT AERO Inhale 2 puffs into the lungs 2 (two) times daily. 5.9 g 0   buPROPion (WELLBUTRIN XL) 150 MG 24 hr tablet TAKE 1 TABLET (150 MG TOTAL) BY MOUTH DAILY. 90 tablet 0   Cholecalciferol (VITAMIN D3) 5000 units CAPS Take 5,000 Units by mouth daily.      DULoxetine (CYMBALTA) 20 MG capsule TAKE 2 CAPSULES (40 MG TOTAL) BY MOUTH DAILY. 180 capsule 0   Evolocumab (REPATHA SURECLICK) 616 MG/ML SOAJ Inject 140 mg into the skin every 14 (fourteen) days. 2 mL 11   fexofenadine (ALLEGRA) 180 MG tablet Take 1 tablet (180 mg total) by mouth daily. 90 tablet 3   HYDROcodone-homatropine (HYCODAN) 5-1.5 MG/5ML syrup Take 5 mLs by mouth every 8 (eight) hours as needed for cough. 240 mL 0   hydrOXYzine (ATARAX/VISTARIL) 25 MG tablet 1-2 tabs by mouth every 6-8 hrs PRN anxiety 60 tablet 0   levothyroxine (SYNTHROID) 175 MCG tablet TAKE 1 TABLET (175 MCG TOTAL) BY MOUTH DAILY BEFORE BREAKFAST. 90 tablet 0   meloxicam (MOBIC) 15 MG tablet TAKE 1 TABLET (15 MG TOTAL) BY MOUTH DAILY. 90 tablet 0   nitroGLYCERIN (NITROSTAT) 0.4 MG SL tablet Place 0.4 mg under the tongue every 5 (five) minutes as needed for chest pain.     omeprazole (PRILOSEC) 20 MG capsule TAKE 1 CAPSULE (20 MG TOTAL) BY MOUTH IN THE MORNING AND AT BEDTIME. 180  capsule 0   spironolactone (ALDACTONE) 25 MG tablet TAKE 1 TABLET (25 MG TOTAL) BY MOUTH DAILY. 90 tablet 0   Tiotropium Bromide Monohydrate (SPIRIVA RESPIMAT) 2.5 MCG/ACT AERS Inhale 2 puffs into the lungs daily. 4 g 0   amoxicillin-clavulanate (AUGMENTIN) 875-125 MG tablet Take 1 tablet by mouth 2 (two) times daily. 20 tablet 0   cyclobenzaprine (FLEXERIL) 10 MG tablet Take 1 tablet (10 mg total) by mouth 3 (three) times daily as needed for muscle spasms. 30 tablet 0   No facility-administered medications prior to visit.    Allergies  Allergen Reactions   Bactrim [Sulfamethoxazole-Trimethoprim] Shortness Of Breath, Anxiety and Palpitations   Ketorolac Shortness Of Breath   Anoro Ellipta [Umeclidinium-Vilanterol] Other (See Comments)    Per patient, caused painful blisters in her mouth.    Morphine Other (See Comments)    Headache    Tetracyclines & Related Itching   Valsartan Other (See Comments) and Cough    Headaches also    Bystolic [Nebivolol Hcl] Other (See Comments)    Fatigue- Tired feeling   Isosorbide Other (See Comments)    HEADACHES    Levofloxacin Hives   Lisinopril Cough   Metoprolol Other (See Comments)    FATIGUE   Morphine And Related Other (See Comments)    Headache    Simvastatin Other (See Comments)    Makes legs hurt/ sore    Singulair [Montelukast Sodium] Palpitations and Cough   Statins Other (See Comments)    MYALGIAS   Tetracycline Hives and Itching   Toradol [Ketorolac Tromethamine] Palpitations    ROS Review of Systems A fourteen system review of systems was performed and found to be positive as per HPI.  Objective:    Physical Exam General:  Well Developed, well nourished, appropriate for stated age.  Neuro:  Alert and oriented,  extra-ocular muscles intact  HEENT:  Normocephalic, atraumatic, neck supple Skin:  no gross rash, warm, pink. Cardiac:  RRR, S1 S2 Respiratory:  ECTA B/L, Not using  accessory muscles,  speaking in full sentences- unlabored. MSK: TTP low back, good ROM, ? +straight leg raise Vascular:  Ext warm, no cyanosis apprec.; LE edema present Psych:  No HI/SI, judgement and insight good, Euthymic mood. Full Affect.   BP 113/76    Pulse 84    Ht 5\' 5"  (1.651 m)    Wt 195 lb 4.8 oz (88.6 kg)    SpO2 95%    BMI 32.50 kg/m  Wt Readings from Last 3 Encounters:  01/21/20 195 lb 4.8 oz (88.6 kg)  12/10/19 191 lb 8 oz (86.9 kg)  10/31/19 198 lb (89.8 kg)     Health Maintenance Due  Topic Date Due   COVID-19 Vaccine (1) Never done    There are no preventive care reminders to display for this patient.  Lab Results  Component Value Date   TSH 3.390 01/21/2020   Lab Results  Component Value Date   WBC 7.0 01/21/2020   HGB 13.2 01/21/2020   HCT 39.1 01/21/2020   MCV 86 01/21/2020   PLT 307 01/21/2020   Lab Results  Component Value Date   NA 139 01/21/2020   K 4.9 01/21/2020   CO2 21 01/21/2020   GLUCOSE 86 01/21/2020   BUN 10 01/21/2020   CREATININE 0.87 01/21/2020   BILITOT 0.2 01/21/2020   ALKPHOS 93 01/21/2020   AST 18 01/21/2020   ALT 11 01/21/2020   PROT 6.7 01/21/2020   ALBUMIN 4.5 01/21/2020   CALCIUM 8.9 01/21/2020   ANIONGAP 10 08/23/2019   GFR 66.63 11/05/2015   Lab Results  Component Value Date   CHOL 185 01/25/2019   Lab Results  Component Value Date   HDL 53 01/25/2019   Lab Results  Component Value Date   LDLCALC 112 (H) 01/25/2019   Lab Results  Component Value Date   TRIG 113 01/25/2019   Lab Results  Component Value Date   CHOLHDL 3.5 01/25/2019   Lab Results  Component Value Date   HGBA1C 5.2 01/21/2020      Assessment & Plan:   Problem List Items Addressed This Visit      Cardiovascular and Mediastinum   HTN, goal below 130/80 - Primary   Relevant Orders   Direct LDL (Completed)   CBC (Completed)   Comprehensive metabolic panel (Completed)   Hemoglobin A1c (Completed)   TSH (Completed)   SARS-CoV-2 Antibody, IgM  (Completed)     Other   Adjustment disorder with mixed anxiety and depressed mood   Pure hypercholesterolemia   Relevant Orders   Direct LDL (Completed)   CBC (Completed)   Comprehensive metabolic panel (Completed)   Hemoglobin A1c (Completed)   TSH (Completed)   SARS-CoV-2 Antibody, IgM (Completed)   Spasm of muscle of lower back   Relevant Medications   cyclobenzaprine (FLEXERIL) 10 MG tablet    Other Visit Diagnoses    Grief reaction       Healthcare maintenance       Relevant Orders   Direct LDL (Completed)   CBC (Completed)   Comprehensive metabolic panel (Completed)   Hemoglobin A1c (Completed)   TSH (Completed)   SARS-CoV-2 Antibody, IgM (Completed)   Need for influenza vaccination       Relevant Orders   Flu Vaccine QUAD 6+ mos PF IM (Fluarix Quad PF) (Completed)   Chronic bilateral low back pain with sciatica, sciatica laterality unspecified       Relevant Medications   cyclobenzaprine (FLEXERIL) 10 MG tablet   Other Relevant  Orders   Ambulatory referral to Neurosurgery   Urinary urgency       Relevant Orders   POCT urinalysis dipstick (Completed)     Hypertension: -BP at goal. -Continue current medication regimen. If lower extremity edema fails to improve or worsens recommend to notify cardiologist since peripheral edema is a common side effect with amlodipine. Patient verbalized understanding. -Follow a low sodium diet and continue to stay hydrated. -Will continue to monitor alongside cardiology.  Pure hypercholesterolemia: -Last lipid panel: total cholesterol 185, triglycerides 113, HDL 53, LDL 112 -Patient is non-fasting so will collect direct LDL today. -Continue current medication regimen (Repatha). -Follow a diet low in saturated and trans fats.  Adjustment disorder with mixed anxiety and depressed mood, Grief: -PHQ-9 score of 0 -Encourage patient to consider and pursue grief counseling. Use good support system. -Continue current medication  regimen. -Will continue to monitor.  Chronic bilateral low back pain with sciatica, spasm of muscle of low back: -Will provide refill of Flexeril to use as needed for low back pain/muscle spasm. -Discussed with patient referral to specialist and prefers to see Dr. Annette Stable (neurosurgeon). -Recommend to avoid exacerbating activities, apply heat and do gentle stretches.  COPD mixed type: -Followed by Pulmonology. -CXR 10/2019: chronic hyperinflation with some coarsened interstitial changes compatible with hx COPD. -Continue current medication regimen.  Urinary urgency: -Collected urinalysis to evaluate for possible UTI.  UA was negative for hematuria, nitrites, and leukocytes. -Recommend to stay well-hydrated.  Patient has a history of COVID-19 infection and would like to have antibody testing.  Meds ordered this encounter  Medications   cyclobenzaprine (FLEXERIL) 10 MG tablet    Sig: Take 1 tablet (10 mg total) by mouth 3 (three) times daily as needed for muscle spasms.    Dispense:  30 tablet    Refill:  1    Order Specific Question:   Supervising Provider    Answer:   Beatrice Lecher D [[id number]]    Follow-up: Return in about 2 months (around 03/22/2020) for Mood, HTN.   Note:  This note was prepared with assistance of Dragon voice recognition software. Occasional wrong-word or sound-a-like substitutions may have occurred due to the inherent limitations of voice recognition software.  Lorrene Reid, PA-C

## 2020-01-21 NOTE — Telephone Encounter (Signed)
Nothing noted in message. Encounter created in error. Will sign off.

## 2020-01-22 LAB — LDL CHOLESTEROL, DIRECT: LDL Direct: 40 mg/dL (ref 0–99)

## 2020-01-22 LAB — COMPREHENSIVE METABOLIC PANEL
ALT: 11 IU/L (ref 0–32)
AST: 18 IU/L (ref 0–40)
Albumin/Globulin Ratio: 2 (ref 1.2–2.2)
Albumin: 4.5 g/dL (ref 3.8–4.8)
Alkaline Phosphatase: 93 IU/L (ref 44–121)
BUN/Creatinine Ratio: 11 — ABNORMAL LOW (ref 12–28)
BUN: 10 mg/dL (ref 8–27)
Bilirubin Total: 0.2 mg/dL (ref 0.0–1.2)
CO2: 21 mmol/L (ref 20–29)
Calcium: 8.9 mg/dL (ref 8.7–10.3)
Chloride: 103 mmol/L (ref 96–106)
Creatinine, Ser: 0.87 mg/dL (ref 0.57–1.00)
GFR calc Af Amer: 83 mL/min/{1.73_m2} (ref >59)
GFR calc non Af Amer: 72 mL/min/{1.73_m2} (ref >59)
Globulin, Total: 2.2 g/dL (ref 1.5–4.5)
Glucose: 86 mg/dL (ref 65–99)
Potassium: 4.9 mmol/L (ref 3.5–5.2)
Sodium: 139 mmol/L (ref 134–144)
Total Protein: 6.7 g/dL (ref 6.0–8.5)

## 2020-01-22 LAB — CBC
Hematocrit: 39.1 % (ref 34.0–46.6)
Hemoglobin: 13.2 g/dL (ref 11.1–15.9)
MCH: 29.1 pg (ref 26.6–33.0)
MCHC: 33.8 g/dL (ref 31.5–35.7)
MCV: 86 fL (ref 79–97)
Platelets: 307 10*3/uL (ref 150–450)
RBC: 4.53 x10E6/uL (ref 3.77–5.28)
RDW: 12.8 % (ref 11.7–15.4)
WBC: 7 10*3/uL (ref 3.4–10.8)

## 2020-01-22 LAB — HEMOGLOBIN A1C
Est. average glucose Bld gHb Est-mCnc: 103 mg/dL
Hgb A1c MFr Bld: 5.2 % (ref 4.8–5.6)

## 2020-01-22 LAB — TSH: TSH: 3.39 u[IU]/mL (ref 0.450–4.500)

## 2020-01-22 LAB — SARS-COV-2 ANTIBODY, IGM: SARS-CoV-2 Antibody, IgM: NEGATIVE

## 2020-02-05 DIAGNOSIS — M5416 Radiculopathy, lumbar region: Secondary | ICD-10-CM | POA: Diagnosis not present

## 2020-02-06 ENCOUNTER — Other Ambulatory Visit: Payer: Self-pay | Admitting: Neurosurgery

## 2020-02-06 DIAGNOSIS — M5416 Radiculopathy, lumbar region: Secondary | ICD-10-CM

## 2020-02-21 ENCOUNTER — Other Ambulatory Visit: Payer: Medicare HMO

## 2020-02-26 ENCOUNTER — Other Ambulatory Visit: Payer: Self-pay

## 2020-02-26 ENCOUNTER — Ambulatory Visit
Admission: RE | Admit: 2020-02-26 | Discharge: 2020-02-26 | Disposition: A | Discharge status: Home / Self Care | Payer: Medicare HMO | Source: Ambulatory Visit | Attending: Neurosurgery | Admitting: Neurosurgery

## 2020-02-26 ENCOUNTER — Other Ambulatory Visit: Payer: Medicare HMO

## 2020-02-26 DIAGNOSIS — M545 Low back pain, unspecified: Secondary | ICD-10-CM | POA: Diagnosis not present

## 2020-02-26 DIAGNOSIS — M5416 Radiculopathy, lumbar region: Secondary | ICD-10-CM

## 2020-03-02 ENCOUNTER — Other Ambulatory Visit: Payer: Self-pay | Admitting: Physician Assistant

## 2020-03-02 DIAGNOSIS — F4323 Adjustment disorder with mixed anxiety and depressed mood: Secondary | ICD-10-CM

## 2020-03-19 DIAGNOSIS — M5416 Radiculopathy, lumbar region: Secondary | ICD-10-CM | POA: Diagnosis not present

## 2020-03-23 ENCOUNTER — Encounter: Payer: Self-pay | Admitting: Physician Assistant

## 2020-03-23 ENCOUNTER — Ambulatory Visit (INDEPENDENT_AMBULATORY_CARE_PROVIDER_SITE_OTHER): Payer: Medicare HMO | Admitting: Physician Assistant

## 2020-03-23 VITALS — Ht 65.0 in | Wt 195.0 lb

## 2020-03-23 DIAGNOSIS — I10 Essential (primary) hypertension: Secondary | ICD-10-CM | POA: Diagnosis not present

## 2020-03-23 DIAGNOSIS — F320 Major depressive disorder, single episode, mild: Secondary | ICD-10-CM

## 2020-03-23 DIAGNOSIS — U099 Post covid-19 condition, unspecified: Secondary | ICD-10-CM

## 2020-03-23 DIAGNOSIS — F4323 Adjustment disorder with mixed anxiety and depressed mood: Secondary | ICD-10-CM

## 2020-03-23 DIAGNOSIS — G4700 Insomnia, unspecified: Secondary | ICD-10-CM

## 2020-03-23 DIAGNOSIS — R053 Chronic cough: Secondary | ICD-10-CM | POA: Diagnosis not present

## 2020-03-23 MED ORDER — AMITRIPTYLINE HCL 50 MG PO TABS: 50.0000 mg | ORAL_TABLET | Freq: Every day | ORAL | Status: DC

## 2020-03-23 NOTE — Progress Notes (Signed)
Telehealth office visit note for Amy Reid, PA-C- at Primary Care at [facility]   I connected with current patient today by telephone and verified that I am speaking with the correct person   . Location of the patient: Home . Location of the provider: Office - This visit type was conducted due to national recommendations for restrictions regarding the COVID-19 Pandemic (e.g. social distancing) in an effort to limit this patient's exposure and mitigate transmission in our community.    - No physical exam could be performed with this format, beyond that communicated to [name] by the patient/ family members as noted.   - Additionally my office staff/ schedulers were to discuss with the patient that there may be a monetary charge related to this service, depending on their medical insurance.  My understanding is that patient understood and consented to proceed.     _________________________________________________________________________________   History of Present Illness: Patient calls in to follow up on hypertension and mood.  HTN: Pt denies chest pain, palpitations, dizziness or headache. Reports decreased amlodipine to 5 mg due to significant swelling in extremities which improved with dose reduction. Has an upcoming appointment with cardiology. Checks BP at home and readings average 141/90. Pt follows a low salt diet. Plans to start walking again.   Mood: Reports her family supported her during the Holidays, which helped her make it through since it was the first Holidays without her husband. Has not needed to take Hydroxyzine as often. Reports medication compliance. Wants to resume original dose of 50 mg of amitriptyline. Denies SI/HI.  Post Covid infection: Continues to have intermittent cough. Reports when she took codeine cough medication the cough would go away for a couple of days and then come back. Medication helped take the edge off. Continues to have loss of taste and smell  with mild improvement.     GAD 7 : Generalized Anxiety Score 10/24/2019 07/19/2018 05/10/2016  Nervous, Anxious, on Edge 0 0 3  Control/stop worrying 0 0 3  Worry too much - different things 0 0 3  Trouble relaxing 0 0 3  Restless 0 0 3  Easily annoyed or irritable 0 0 3  Afraid - awful might happen 0 0 0  Total GAD 7 Score 0 0 18  Anxiety Difficulty - Not difficult at all Somewhat difficult    Depression screen [facility] 2/9 03/23/2020 01/21/2020 12/10/2019 10/24/2019 07/25/2019  Decreased Interest 0 0 0 0 0  Down, Depressed, Hopeless 0 0 0 0 0  PHQ - 2 Score 0 0 0 0 0  Altered sleeping 0 0 0 0 0  Tired, decreased energy 0 0 0 0 0  Change in appetite 3 0 0 0 0  Feeling bad or failure about yourself  0 0 0 0 0  Trouble concentrating 0 0 0 0 0  Moving slowly or fidgety/restless 0 0 0 0 0  Suicidal thoughts 0 0 0 0 0  PHQ-9 Score 3 0 0 0 0  Difficult doing work/chores Not difficult at all - - - -  Some recent data might be hidden      Impression and Recommendations:     1. HTN, goal below 130/80   2. Adjustment disorder with mixed anxiety and depressed mood   3. Post-COVID chronic cough   4. Insomnia, unspecified type   5. Current mild episode of major depressive disorder, unspecified whether recurrent ([place])     Hypertension: -Patient unable to tolerate amlodipine 10 mg  due to peripheral edema. Recommend to follow up with cardiology as scheduled for medication adjustments.  -Continue current medication regimen. -Continue low sodium diet and stay well hydrated. -Last CMP renal function and electrolytes wnl.  Adjustment disorder with mixed and depressed mood: -PHQ-9 score of 3, stable. -Discussed with patient reasonable to resume amitriptyline 50 mg. Continue bupropion, duloxetine, and hydroxyzine prn. -Will continue to monitor.  Post-covid cough:  -Discussed with patient existing co-morbidities are likely also contributing to chronic cough. Recommend breathing exercises and  continue to follow up with Pulmonology as instructed. Recommend to start exercise as tolerable.     - As part of my medical decision making, I reviewed the following data within the [place] History obtained from pt /family, CMA notes reviewed and incorporated if applicable, Labs reviewed, Radiograph/ tests reviewed if applicable and OV notes from prior OV's with me, as well as any other specialists she/he has seen since seeing me last, were all reviewed and used in my medical decision making process today.    - Additionally, when appropriate, discussion had with patient regarding our treatment plan, and their biases/concerns about that plan were used in my medical decision making today.    - The patient agreed with the plan and demonstrated an understanding of the instructions.   No barriers to understanding were identified.     - The patient was advised to call back or seek an in-person evaluation if the symptoms worsen or if the condition fails to improve as anticipated.   Return in about 4 months (around 07/21/2020) for CPE and FBW.    No orders of the defined types were placed in this encounter.   Meds ordered this encounter  Medications  . amitriptyline (ELAVIL) 50 MG tablet    Sig: Take 1 tablet (50 mg total) by mouth at bedtime.    Order Specific Question:   Supervising Provider    Answer:   Beatrice Lecher D [[id number]]    Medications Discontinued During This Encounter  Medication Reason  . amitriptyline (ELAVIL) 75 MG tablet        Time spent on visit including pre-visit chart review and post-visit care was 15 minutes.      The [place] was signed into law in 2016 which includes the topic of electronic health records.  This provides immediate access to information in MyChart.  This includes consultation notes, operative notes, office notes, lab results and pathology reports.  If you have any questions about what you read please let us  know at your next visit or call us at the office.  We are right here with you.   __________________________________________________________________________________     Patient Care Team    Relationship Specialty Notifications Start End  Amy Hodge, [place] PCP - General   07/07/19   Lelon Perla, MD PCP - Cardiology Cardiology Admissions 09/17/19   Lelon Perla, MD Consulting Physician Cardiology  05/10/16   Deneise Lever, MD Consulting Physician Pulmonary Disease  05/10/16   Roseanne Kaufman, MD Consulting Physician Orthopedic Surgery  05/10/16   Garlan Fair, MD Consulting Physician Gastroenterology  05/10/16      -Vitals obtained; medications/ allergies reconciled;  personal medical, social, Sx etc.histories were updated by CMA, reviewed by me and are reflected in chart   Patient Active Problem List   Diagnosis Date Noted  . Weight gain- subjective 04/05/2019  . Obesity, Class I, BMI 30-34.9 04/05/2019  . HTN, goal below 130/80 04/05/2019  .  Hip pain, bilateral 01/25/2019  . OSA and COPD overlap syndrome ([place]) 07/19/2018  . Spasm of muscle of lower back 06/20/2018  . Dysuria  06/20/2018  . Drug intolerance- statin 03/21/2018  . Pure hypercholesterolemia 11/13/2017  . Generalized OA 11/13/2017  . Allergic Rhinosinusitis 11/13/2017  . Chronic cough due to COPD 11/13/2017  . Environmental and seasonal allergies-on Zyrtec, has reactive airway disease component seasonally 07/11/2017  . Lung nodule: benign-appearing, screening CT each December 07/11/2017  . Senile solar keratosis 02/06/2017  . COPD with acute exacerbation ([place]) 01/11/2017  . S/P total hysterectomy and bilateral salpingo-oophorectomy 12/30/2016  . Adjustment disorder with mixed anxiety and depressed mood 08/02/2016  . Myalgia/ generalized fibromyalgia 08/02/2016  . ACS (acute coronary syndrome) 06/28/2016  . Crescendo angina ([place]) 06/28/2016  . Insomnia secondary to anxiety 05/10/2016  . Cough  04/28/2016  . Obstructive sleep apnea 08/26/2015  . Shingles rash 08/04/2015  . Allergic reaction 06/27/2015  . History of hemoptysis 06/27/2015  . Stented coronary artery 02/26/2015  . Nocturnal hypoxemia 02/26/2015  . Hemoptysis 10/24/2014  . Centrilobular emphysema ([place]) 06/24/2014  . Soft tissue injury of multiple sites/bilateral hands 05/16/2013  . Bite from dog 05/15/2013  . Chest pain 05/15/2013  . Coronary artery disease 04/08/2013  . COPD exacerbation ([place]) 12/31/2010  . Health care maintenance 12/25/2010  . MENOPAUSAL DISORDER 02/16/2010  . Vitamin D deficiency 10/07/2009  . Chronic pain syndrome 02/20/2009  . COMMON MIGRAINE 02/20/2009  . OSTEOARTHRITIS, KNEE, RIGHT 02/20/2009  . BACK PAIN 11/27/2008  . Fatigue- chronic 07/15/2008  . Memory loss 07/15/2008  . PERIPHERAL EDEMA 07/15/2008  . KNEE PAIN, BILATERAL 09/05/2007  . Hyperlipidemia- intol to statins 06/14/2007  . Seasonal and perennial allergic rhinitis 06/14/2007  . COPD mixed type ([place]) 06/14/2007  . post-operative Hypothyroidism 10/06/2006  . Anxiety state 10/06/2006  . Depression 10/06/2006  . GERD 10/06/2006     Current Meds  Medication Sig  . acetaminophen (TYLENOL) 500 MG tablet Take 1,000 mg by mouth every 8 (eight) hours as needed (for pain or headaches).  Marland Kitchen albuterol (VENTOLIN HFA) 108 (90 Base) MCG/ACT inhaler Inhale 1-2 puffs into the lungs every 6 (six) hours as needed for wheezing or shortness of breath.  Marland Kitchen amLODipine (NORVASC) 10 MG tablet Take 1 tablet (10 mg total) by mouth daily.  Marland Kitchen aspirin EC 81 MG tablet Take 81 mg by mouth daily at 6 PM.   . B Complex Vitamins (VITAMIN B COMPLEX PO) Take 1 tablet by mouth daily.  . Budeson-Glycopyrrol-Formoterol (BREZTRI AEROSPHERE) 160-9-4.8 MCG/ACT AERO Inhale 2 puffs into the lungs 2 (two) times daily.  Marland Kitchen buPROPion (WELLBUTRIN XL) 150 MG 24 hr tablet TAKE 1 TABLET EVERY DAY  . Cholecalciferol (VITAMIN D3) 5000 units CAPS Take 5,000 Units by mouth  daily.   . cyclobenzaprine (FLEXERIL) 10 MG tablet Take 1 tablet (10 mg total) by mouth 3 (three) times daily as needed for muscle spasms.  . DULoxetine (CYMBALTA) 20 MG capsule TAKE 2 CAPSULES (40 MG TOTAL) BY MOUTH DAILY.  Marland Kitchen Evolocumab (REPATHA SURECLICK) [id number] MG/ML SOAJ Inject 140 mg into the skin every 14 (fourteen) days.  . fexofenadine (ALLEGRA) 180 MG tablet Take 1 tablet (180 mg total) by mouth daily.  Marland Kitchen HYDROcodone-homatropine (HYCODAN) 5-1.5 MG/5ML syrup Take 5 mLs by mouth every 8 (eight) hours as needed for cough.  . hydrOXYzine (ATARAX/VISTARIL) 25 MG tablet 1-2 tabs by mouth every 6-8 hrs PRN anxiety  . levothyroxine (SYNTHROID) 175 MCG tablet TAKE 1 TABLET (175 MCG TOTAL)  BY MOUTH DAILY BEFORE BREAKFAST.  . meloxicam (MOBIC) 15 MG tablet TAKE 1 TABLET (15 MG TOTAL) BY MOUTH DAILY.  . nitroGLYCERIN (NITROSTAT) 0.4 MG SL tablet Place 0.4 mg under the tongue every 5 (five) minutes as needed for chest pain.  Marland Kitchen omeprazole (PRILOSEC) 20 MG capsule TAKE 1 CAPSULE (20 MG TOTAL) BY MOUTH IN THE MORNING AND AT BEDTIME.  Marland Kitchen spironolactone (ALDACTONE) 25 MG tablet TAKE 1 TABLET (25 MG TOTAL) BY MOUTH DAILY.  Marland Kitchen Tiotropium Bromide Monohydrate (SPIRIVA RESPIMAT) 2.5 MCG/ACT AERS Inhale 2 puffs into the lungs daily.  . [DISCONTINUED] amitriptyline (ELAVIL) 75 MG tablet Take 1 tablet (75 mg total) by mouth at bedtime.     Allergies:  Allergies  Allergen Reactions  . Bactrim [Sulfamethoxazole-Trimethoprim] Shortness Of Breath, Anxiety and Palpitations  . Ketorolac Shortness Of Breath  . Anoro Ellipta [Umeclidinium-Vilanterol] Other (See Comments)    Per patient, caused painful blisters in her mouth.   . Morphine Other (See Comments)    Headache   . Tetracyclines & Related Itching  . Valsartan Other (See Comments) and Cough    Headaches also   . Bystolic [Nebivolol Hcl] Other (See Comments)    Fatigue- Tired feeling  . Isosorbide Other (See Comments)    HEADACHES   . Levofloxacin Hives   . Lisinopril Cough  . Metoprolol Other (See Comments)    FATIGUE  . Morphine And Related Other (See Comments)    Headache   . Simvastatin Other (See Comments)    Makes legs hurt/ sore   . Singulair [Montelukast Sodium] Palpitations and Cough  . Statins Other (See Comments)    MYALGIAS  . Tetracycline Hives and Itching  . Toradol [Ketorolac Tromethamine] Palpitations     ROS:  See above HPI for pertinent positives and negatives   Objective:   Height 5\' 5"  (1.651 m), weight 195 lb (88.5 kg).  (if some vitals are omitted, this means that patient was UNABLE to obtain them. ) General: A & O * 3; sounds in no acute distress  Respiratory: speaking in full sentences, no conversational dyspnea Psych: insight appears good, mood- appears full

## 2020-03-25 ENCOUNTER — Telehealth: Payer: Self-pay | Admitting: Physician Assistant

## 2020-03-27 ENCOUNTER — Encounter: Payer: Self-pay | Admitting: Physician Assistant

## 2020-03-30 NOTE — Telephone Encounter (Signed)
completed

## 2020-04-15 NOTE — Progress Notes (Signed)
HPI: FUCAD. Previously followed by Dr Einar Gip. Pt had NSTEMI 3/29 with complicated PCI of RCA (performed in [facility]). ABI 12/15 normal.Cath 6/16 showed 100 PDA, patent stents in proximal and mid RCA, no obstructive disease in the left system; collaterals to the PDA noted; PTCA of CTO unsuccessful.Last cath 10/17 showed 100 PDA (filled with left to right collaterals), patent RCA stent and normal LV function. H/O cough with ACEI. Myalgias with crestor.Dr Martinique reviewed and did not think attempt at CTO intervention or CABG was appropriate.  Neck CTA June 2021 showed no significant carotid stenosis. CTA July 2021 showed 41 mm ascending aorta, suggestion of pulmonary hypertension, and heterogeneous enhancement of the liver.  Most recent echocardiogram July 2021 showed normal LV function, mild left ventricular hypertrophy, grade 1 diastolic dysfunction, hypokinesis of the lateral wall, trace aortic insufficiency and mildly dilated ascending aorta at 42 mm.Since last seen,she continues to have occasional chest pain that may be slightly worse compared to previous.  Some dyspnea on exertion.  Current Outpatient Medications  Medication Sig Dispense Refill  . acetaminophen (TYLENOL) 500 MG tablet Take 1,000 mg by mouth every 8 (eight) hours as needed (for pain or headaches).    Marland Kitchen albuterol (VENTOLIN HFA) 108 (90 Base) MCG/ACT inhaler Inhale 1-2 puffs into the lungs every 6 (six) hours as needed for wheezing or shortness of breath.    Marland Kitchen amitriptyline (ELAVIL) 50 MG tablet Take 1 tablet (50 mg total) by mouth at bedtime.    Marland Kitchen amLODipine (NORVASC) 10 MG tablet Take 1 tablet (10 mg total) by mouth daily. 90 tablet 3  . aspirin EC 81 MG tablet Take 81 mg by mouth daily at 6 PM.     . B Complex Vitamins (VITAMIN B COMPLEX PO) Take 1 tablet by mouth daily.    Marland Kitchen buPROPion (WELLBUTRIN XL) 150 MG 24 hr tablet TAKE 1 TABLET EVERY DAY 90 tablet 0  . Cholecalciferol (VITAMIN D3) 5000 units CAPS Take 5,000 Units by  mouth daily.     . cyclobenzaprine (FLEXERIL) 10 MG tablet Take 1 tablet (10 mg total) by mouth 3 (three) times daily as needed for muscle spasms. 30 tablet 1  . DULoxetine (CYMBALTA) 20 MG capsule TAKE 2 CAPSULES (40 MG TOTAL) BY MOUTH DAILY. 180 capsule 0  . Evolocumab (REPATHA SURECLICK) 924 MG/ML SOAJ Inject 140 mg into the skin every 14 (fourteen) days. 2 mL 11  . fexofenadine (ALLEGRA) 180 MG tablet Take 1 tablet (180 mg total) by mouth daily. 90 tablet 3  . HYDROcodone-homatropine (HYCODAN) 5-1.5 MG/5ML syrup Take 5 mLs by mouth every 8 (eight) hours as needed for cough. 240 mL 0  . hydrOXYzine (ATARAX/VISTARIL) 25 MG tablet 1-2 tabs by mouth every 6-8 hrs PRN anxiety 60 tablet 0  . levothyroxine (SYNTHROID) 175 MCG tablet TAKE 1 TABLET (175 MCG TOTAL) BY MOUTH DAILY BEFORE BREAKFAST. 90 tablet 0  . meloxicam (MOBIC) 15 MG tablet TAKE 1 TABLET (15 MG TOTAL) BY MOUTH DAILY. 90 tablet 0  . nitroGLYCERIN (NITROSTAT) 0.4 MG SL tablet Place 0.4 mg under the tongue every 5 (five) minutes as needed for chest pain.    Marland Kitchen omeprazole (PRILOSEC) 20 MG capsule TAKE 1 CAPSULE (20 MG TOTAL) BY MOUTH IN THE MORNING AND AT BEDTIME. 180 capsule 0  . spironolactone (ALDACTONE) 25 MG tablet TAKE 1 TABLET (25 MG TOTAL) BY MOUTH DAILY. 90 tablet 0  . Tiotropium Bromide Monohydrate (SPIRIVA RESPIMAT) 2.5 MCG/ACT AERS Inhale 2 puffs into the lungs  daily. 4 g 0   No current facility-administered medications for this visit.     Past Medical History:  Diagnosis Date  . Allergy   . Anxiety   . COPD (chronic obstructive pulmonary disease) ([place])   . Coronary artery disease    a. s/p PCI of RCA in 2015 b. cath in 12/2015 showed 100% Ost RPDA with collaterals present  . DDD (degenerative disc disease), cervical   . Depression   . DJD (degenerative joint disease) of knee    RT  . GERD (gastroesophageal reflux disease)   . Hashimoto's disease   . Headache(784.0)   . Hyperlipidemia   . Hypertension   .  Hypothyroidism   . Myocardial infarction ([place])   . Nocturnal hypoxemia 02/26/2015  . On supplemental oxygen therapy    Oxygen 2.5 l/m at bedtime.  . Stented coronary artery 02/26/2015    Past Surgical History:  Procedure Laterality Date  . ABDOMINAL HYSTERECTOMY    . APPENDECTOMY  1971  . BUNIONECTOMY Right   . CARDIAC CATHETERIZATION N/A 09/02/2014   Procedure: Left Heart Cath and Coronary Angiography;  Surgeon: Adrian Prows, MD;  Location: [place] [facility];  Service: Cardiovascular;  Laterality: N/A;  . CARDIAC CATHETERIZATION N/A 12/15/2015   Procedure: Left Heart Cath and Coronary Angiography;  Surgeon: Jettie Booze, MD;  Location: [place] [facility];  Service: Cardiovascular;  Laterality: N/A;  . CERVICAL FUSION    . CESAREAN SECTION    . COLONOSCOPY WITH PROPOFOL N/A 02/24/2015   Procedure: COLONOSCOPY WITH PROPOFOL;  Surgeon: Garlan Fair, MD;  Location: [facility];  Service: Endoscopy;  Laterality: N/A;  . ESOPHAGOGASTRODUODENOSCOPY (EGD) WITH PROPOFOL N/A 02/24/2015   Procedure: ESOPHAGOGASTRODUODENOSCOPY (EGD) WITH PROPOFOL;  Surgeon: Garlan Fair, MD;  Location: [facility];  Service: Endoscopy;  Laterality: N/A;  . HAND SURGERY     due to trauma-dog bite.  . I & D EXTREMITY Bilateral 05/17/2013   Procedure: IRRIGATION AND DEBRIDEMENT EXTREMITY;  Surgeon: Roseanne Kaufman, MD;  Location: [place];  Service: Orthopedics;  Laterality: Bilateral;  . LEFT HEART CATHETERIZATION WITH CORONARY ANGIOGRAM N/A 03/04/2014   Procedure: LEFT HEART CATHETERIZATION WITH CORONARY ANGIOGRAM;  Surgeon: Laverda Page, MD;  Location: [facility];  Service: Cardiovascular;  Laterality: N/A;  . OOPHORECTOMY    . SHOULDER SURGERY     RT  . THYROIDECTOMY  1996  . TONSILLECTOMY  1965  . TOTAL ABDOMINAL HYSTERECTOMY W/ BILATERAL SALPINGOOPHORECTOMY  01/2008   with cervical dysplasia  . TUBAL LIGATION  1984    Social History   Socioeconomic History  . Marital status: Married     Spouse name: Not on file  . Number of children: 1  . Years of education: Not on file  . Highest education level: Not on file  Occupational History  . Occupation: disabled    Employer: [employer]  Tobacco Use  . Smoking status: Former Smoker    Packs/day: 2.00    Years: 25.00    Pack years: 50.00    Types: Cigarettes    Quit date: 04/23/1996    Years since quitting: 24.0  . Smokeless tobacco: Never Used  Vaping Use  . Vaping Use: Never used  Substance and Sexual Activity  . Alcohol use: No    Alcohol/week: 0.0 standard drinks  . Drug use: No  . Sexual activity: Not Currently  Other Topics Concern  . Not on file  Social History Narrative   On worker's comp for lower back  and right shoulder - applying for disability; losing [employer] insurance 03/07/2010   Social Determinants of Health   Financial Resource Strain: Not on file  Food Insecurity: Not on file  Transportation Needs: Not on file  Physical Activity: Not on file  Stress: Not on file  Social Connections: Not on file  Intimate Partner Violence: Not on file    Family History  Problem Relation Age of Onset  . Lung cancer Mother   . Hypertension Mother   . Stroke Mother   . Diabetes Mother   . Diabetes Father   . Hypertension Father   . Heart attack Father   . Asthma Father   . COPD Father   . Alcohol abuse Father   . Diabetes Sister   . Hypertension Sister     ROS: no fevers or chills, productive cough, hemoptysis, dysphasia, odynophagia, melena, hematochezia, dysuria, hematuria, rash, seizure activity, orthopnea, PND, pedal edema, claudication. Remaining systems are negative.  Physical Exam: Well-developed well-nourished in no acute distress.  Skin is warm and dry.  HEENT is normal.  Neck is supple.  Chest is clear to auscultation with normal expansion.  Cardiovascular exam is regular rate and rhythm.  Abdominal exam nontender or distended. No masses palpated. Extremities show no edema. neuro  grossly intact  ECG-normal sinus rhythm at a rate of 87, nonspecific ST changes.  No change compared to previous.  Personally reviewed  A/P  1 chest pain-previous catheterization revealed occluded PDA and PCI not felt to be a good option and medical therapy recommended.  She has had problems with chronic chest pain.  Electrocardiogram shows no ST changes.  We will continue medical therapy.  Note she did not tolerate beta-blockers, nitrates or Ranexa in the past.  2 dilated ascending aorta-patient will need follow-up chest CT July 2022.  3 coronary artery disease-continue aspirin.  Patient is intolerant to statins.  4 hyperlipidemia-patient is intolerant to Zetia or statins.  Continue Repatha.  5 hypertension-patient's blood pressure is controlled.  Continue present medications and follow.  Kirk Ruths, MD

## 2020-04-24 ENCOUNTER — Encounter: Payer: Self-pay | Admitting: Physician Assistant

## 2020-04-25 DIAGNOSIS — J019 Acute sinusitis, unspecified: Secondary | ICD-10-CM | POA: Diagnosis not present

## 2020-04-25 DIAGNOSIS — Z1152 Encounter for screening for COVID-19: Secondary | ICD-10-CM | POA: Diagnosis not present

## 2020-04-28 ENCOUNTER — Encounter: Payer: Self-pay | Admitting: Cardiology

## 2020-04-28 ENCOUNTER — Other Ambulatory Visit: Payer: Self-pay | Admitting: Physician Assistant

## 2020-04-28 ENCOUNTER — Other Ambulatory Visit: Payer: Self-pay

## 2020-04-28 ENCOUNTER — Ambulatory Visit: Payer: Medicare HMO | Admitting: Cardiology

## 2020-04-28 ENCOUNTER — Other Ambulatory Visit: Payer: Self-pay | Admitting: Family Medicine

## 2020-04-28 VITALS — BP 132/80 | HR 87 | Ht 65.5 in | Wt 201.0 lb

## 2020-04-28 DIAGNOSIS — E890 Postprocedural hypothyroidism: Secondary | ICD-10-CM

## 2020-04-28 DIAGNOSIS — I10 Essential (primary) hypertension: Secondary | ICD-10-CM

## 2020-04-28 DIAGNOSIS — I712 Thoracic aortic aneurysm, without rupture, unspecified: Secondary | ICD-10-CM

## 2020-04-28 DIAGNOSIS — R072 Precordial pain: Secondary | ICD-10-CM | POA: Diagnosis not present

## 2020-04-28 DIAGNOSIS — E7800 Pure hypercholesterolemia, unspecified: Secondary | ICD-10-CM | POA: Diagnosis not present

## 2020-04-28 DIAGNOSIS — F4323 Adjustment disorder with mixed anxiety and depressed mood: Secondary | ICD-10-CM

## 2020-04-28 DIAGNOSIS — I25119 Atherosclerotic heart disease of native coronary artery with unspecified angina pectoris: Secondary | ICD-10-CM | POA: Diagnosis not present

## 2020-04-28 MED ORDER — AMLODIPINE BESYLATE 5 MG PO TABS: 5.0000 mg | ORAL_TABLET | Freq: Every day | ORAL | 3 refills | Status: DC

## 2020-04-28 NOTE — Patient Instructions (Signed)

## 2020-05-04 ENCOUNTER — Encounter: Payer: Self-pay | Admitting: Physician Assistant

## 2020-05-04 DIAGNOSIS — F4323 Adjustment disorder with mixed anxiety and depressed mood: Secondary | ICD-10-CM

## 2020-05-04 DIAGNOSIS — E890 Postprocedural hypothyroidism: Secondary | ICD-10-CM

## 2020-05-04 MED ORDER — BUPROPION HCL ER (XL) 150 MG PO TB24: 150.0000 mg | ORAL_TABLET | Freq: Every day | ORAL | 0 refills | Status: DC

## 2020-05-04 MED ORDER — LEVOTHYROXINE SODIUM 175 MCG PO TABS: 175.0000 ug | ORAL_TABLET | Freq: Every day | ORAL | 0 refills | Status: DC

## 2020-05-05 ENCOUNTER — Ambulatory Visit: Payer: Medicare HMO | Admitting: Internal Medicine

## 2020-05-10 ENCOUNTER — Other Ambulatory Visit: Payer: Self-pay | Admitting: Physician Assistant

## 2020-05-10 ENCOUNTER — Encounter: Payer: Self-pay | Admitting: Physician Assistant

## 2020-05-10 MED ORDER — ALBUTEROL SULFATE HFA 108 (90 BASE) MCG/ACT IN AERS: 1.0000 | INHALATION_SPRAY | Freq: Four times a day (QID) | RESPIRATORY_TRACT | 1 refills | Status: DC | PRN

## 2020-06-03 ENCOUNTER — Other Ambulatory Visit: Payer: Self-pay | Admitting: Physician Assistant

## 2020-06-03 DIAGNOSIS — M791 Myalgia, unspecified site: Secondary | ICD-10-CM

## 2020-06-03 DIAGNOSIS — E890 Postprocedural hypothyroidism: Secondary | ICD-10-CM

## 2020-06-03 DIAGNOSIS — K219 Gastro-esophageal reflux disease without esophagitis: Secondary | ICD-10-CM

## 2020-06-03 DIAGNOSIS — I10 Essential (primary) hypertension: Secondary | ICD-10-CM

## 2020-07-08 ENCOUNTER — Other Ambulatory Visit: Payer: Self-pay | Admitting: Physician Assistant

## 2020-07-08 DIAGNOSIS — F4323 Adjustment disorder with mixed anxiety and depressed mood: Secondary | ICD-10-CM

## 2020-07-08 DIAGNOSIS — M791 Myalgia, unspecified site: Secondary | ICD-10-CM

## 2020-07-09 ENCOUNTER — Other Ambulatory Visit: Payer: Self-pay

## 2020-07-09 DIAGNOSIS — Z0000 Encounter for general adult medical examination without abnormal findings: Secondary | ICD-10-CM

## 2020-07-14 ENCOUNTER — Telehealth: Payer: Self-pay | Admitting: Cardiology

## 2020-07-14 NOTE — Telephone Encounter (Signed)
Spoke with pt, aware her CT scan is due in July and will be in touch when closer to due date. Pt agreed with this plan.

## 2020-07-14 NOTE — Telephone Encounter (Signed)
Pt sent a message via pt schedule requesting a message be sent to the nurse wanting information about a CT on her aorta she states she I supposed to be scheduling to have performed. No active request is in the system for this. Advising pt this message is being sent and a nurse will be contacting her to discuss further. Please advise.

## 2020-07-17 ENCOUNTER — Other Ambulatory Visit: Payer: Medicare HMO

## 2020-07-17 ENCOUNTER — Other Ambulatory Visit: Payer: Self-pay

## 2020-07-17 DIAGNOSIS — E890 Postprocedural hypothyroidism: Secondary | ICD-10-CM

## 2020-07-17 DIAGNOSIS — Z0000 Encounter for general adult medical examination without abnormal findings: Secondary | ICD-10-CM | POA: Diagnosis not present

## 2020-07-17 DIAGNOSIS — E785 Hyperlipidemia, unspecified: Secondary | ICD-10-CM | POA: Diagnosis not present

## 2020-07-17 DIAGNOSIS — R5382 Chronic fatigue, unspecified: Secondary | ICD-10-CM

## 2020-07-17 DIAGNOSIS — E7800 Pure hypercholesterolemia, unspecified: Secondary | ICD-10-CM

## 2020-07-17 DIAGNOSIS — I10 Essential (primary) hypertension: Secondary | ICD-10-CM | POA: Diagnosis not present

## 2020-07-18 LAB — COMPREHENSIVE METABOLIC PANEL
ALT: 11 IU/L (ref 0–32)
AST: 21 IU/L (ref 0–40)
Albumin/Globulin Ratio: 1.9 (ref 1.2–2.2)
Albumin: 4.4 g/dL (ref 3.8–4.8)
Alkaline Phosphatase: 91 IU/L (ref 44–121)
BUN/Creatinine Ratio: 23 (ref 12–28)
BUN: 18 mg/dL (ref 8–27)
Bilirubin Total: 0.3 mg/dL (ref 0.0–1.2)
CO2: 22 mmol/L (ref 20–29)
Calcium: 9.2 mg/dL (ref 8.7–10.3)
Chloride: 100 mmol/L (ref 96–106)
Creatinine, Ser: 0.78 mg/dL (ref 0.57–1.00)
Globulin, Total: 2.3 g/dL (ref 1.5–4.5)
Glucose: 92 mg/dL (ref 65–99)
Potassium: 4.3 mmol/L (ref 3.5–5.2)
Sodium: 141 mmol/L (ref 134–144)
Total Protein: 6.7 g/dL (ref 6.0–8.5)
eGFR: 86 mL/min/{1.73_m2} (ref >59)

## 2020-07-18 LAB — LIPID PANEL
Chol/HDL Ratio: 3.6 ratio (ref 0.0–4.4)
Cholesterol, Total: 192 mg/dL (ref 100–199)
HDL: 54 mg/dL (ref >39)
LDL Chol Calc (NIH): 114 mg/dL — ABNORMAL HIGH (ref 0–99)
Triglycerides: 134 mg/dL (ref 0–149)
VLDL Cholesterol Cal: 24 mg/dL (ref 5–40)

## 2020-07-18 LAB — CBC
Hematocrit: 41 % (ref 34.0–46.6)
Hemoglobin: 13.9 g/dL (ref 11.1–15.9)
MCH: 29.1 pg (ref 26.6–33.0)
MCHC: 33.9 g/dL (ref 31.5–35.7)
MCV: 86 fL (ref 79–97)
Platelets: 275 10*3/uL (ref 150–450)
RBC: 4.78 x10E6/uL (ref 3.77–5.28)
RDW: 12.9 % (ref 11.7–15.4)
WBC: 5.9 10*3/uL (ref 3.4–10.8)

## 2020-07-18 LAB — T4, FREE: Free T4: 1.54 ng/dL (ref 0.82–1.77)

## 2020-07-18 LAB — TSH: TSH: 2.85 u[IU]/mL (ref 0.450–4.500)

## 2020-07-18 LAB — T3: T3, Total: 136 ng/dL (ref 71–180)

## 2020-07-18 LAB — HEMOGLOBIN A1C
Est. average glucose Bld gHb Est-mCnc: 108 mg/dL
Hgb A1c MFr Bld: 5.4 % (ref 4.8–5.6)

## 2020-07-21 ENCOUNTER — Encounter: Payer: Self-pay | Admitting: Physician Assistant

## 2020-07-21 ENCOUNTER — Other Ambulatory Visit: Payer: Self-pay

## 2020-07-21 ENCOUNTER — Ambulatory Visit (INDEPENDENT_AMBULATORY_CARE_PROVIDER_SITE_OTHER): Payer: Medicare HMO | Admitting: Physician Assistant

## 2020-07-21 VITALS — BP 124/81 | HR 82 | Temp 97.8°F | Ht 65.0 in | Wt 202.8 lb

## 2020-07-21 DIAGNOSIS — R300 Dysuria: Secondary | ICD-10-CM

## 2020-07-21 DIAGNOSIS — J449 Chronic obstructive pulmonary disease, unspecified: Secondary | ICD-10-CM | POA: Diagnosis not present

## 2020-07-21 DIAGNOSIS — Z0000 Encounter for general adult medical examination without abnormal findings: Secondary | ICD-10-CM | POA: Diagnosis not present

## 2020-07-21 LAB — POCT URINALYSIS DIPSTICK
Bilirubin, UA: NEGATIVE
Blood, UA: NEGATIVE
Glucose, UA: NEGATIVE
Ketones, UA: NEGATIVE
Nitrite, UA: NEGATIVE
Protein, UA: NEGATIVE
Spec Grav, UA: 1.015 (ref 1.010–1.025)
Urobilinogen, UA: 0.2 E.U./dL
pH, UA: 5.5 (ref 5.0–8.0)

## 2020-07-21 MED ORDER — IPRATROPIUM BROMIDE 0.02 % IN SOLN: 0.5000 mg | Freq: Four times a day (QID) | RESPIRATORY_TRACT | 12 refills | Status: DC

## 2020-07-21 NOTE — Patient Instructions (Signed)
Preventive Care 84-63 Years Old, Female Preventive care refers to lifestyle choices and visits with your health care provider that can promote health and wellness. This includes:  A yearly physical exam. This is also called an annual wellness visit.  Regular dental and eye exams.  Immunizations.  Screening for certain conditions.  Healthy lifestyle choices, such as: ? Eating a healthy diet. ? Getting regular exercise. ? Not using drugs or products that contain nicotine and tobacco. ? Limiting alcohol use. What can I expect for my preventive care visit? Physical exam Your health care provider will check your:  Height and weight. These may be used to calculate your BMI (body mass index). BMI is a measurement that tells if you are at a healthy weight.  Heart rate and blood pressure.  Body temperature.  Skin for abnormal spots. Counseling Your health care provider may ask you questions about your:  Past medical problems.  Family's medical history.  Alcohol, tobacco, and drug use.  Emotional well-being.  Home life and relationship well-being.  Sexual activity.  Diet, exercise, and sleep habits.  Work and work Statistician.  Access to firearms.  Method of birth control.  Menstrual cycle.  Pregnancy history. What immunizations do I need? Vaccines are usually given at various ages, according to a schedule. Your health care provider will recommend vaccines for you based on your age, medical history, and lifestyle or other factors, such as travel or where you work.   What tests do I need? Blood tests  Lipid and cholesterol levels. These may be checked every 5 years, or more often if you are over 3 years old.  Hepatitis C test.  Hepatitis B test. Screening  Lung cancer screening. You may have this screening every year starting at age 73 if you have a 30-pack-year history of smoking and currently smoke or have quit within the past 15 years.  Colorectal cancer  screening. ? All adults should have this screening starting at age 52 and continuing until age 17. ? Your health care provider may recommend screening at age 49 if you are at increased risk. ? You will have tests every 1-10 years, depending on your results and the type of screening test.  Diabetes screening. ? This is done by checking your blood sugar (glucose) after you have not eaten for a while (fasting). ? You may have this done every 1-3 years.  Mammogram. ? This may be done every 1-2 years. ? Talk with your health care provider about when you should start having regular mammograms. This may depend on whether you have a family history of breast cancer.  BRCA-related cancer screening. This may be done if you have a family history of breast, ovarian, tubal, or peritoneal cancers.  Pelvic exam and Pap test. ? This may be done every 3 years starting at age 10. ? Starting at age 11, this may be done every 5 years if you have a Pap test in combination with an HPV test. Other tests  STD (sexually transmitted disease) testing, if you are at risk.  Bone density scan. This is done to screen for osteoporosis. You may have this scan if you are at high risk for osteoporosis. Talk with your health care provider about your test results, treatment options, and if necessary, the need for more tests. Follow these instructions at home: Eating and drinking  Eat a diet that includes fresh fruits and vegetables, whole grains, lean protein, and low-fat dairy products.  Take vitamin and mineral supplements  as recommended by your health care provider.  Do not drink alcohol if: ? Your health care provider tells you not to drink. ? You are pregnant, may be pregnant, or are planning to become pregnant.  If you drink alcohol: ? Limit how much you have to 0-1 drink a day. ? Be aware of how much alcohol is in your drink. In the [place] one drink equals one 12 oz bottle of beer (355 mL), one 5 oz glass of  wine (148 mL), or one 1 oz glass of hard liquor (44 mL).   Lifestyle  Take daily care of your teeth and gums. Brush your teeth every morning and night with fluoride toothpaste. Floss one time each day.  Stay active. Exercise for at least 30 minutes 5 or more days each week.  Do not use any products that contain nicotine or tobacco, such as cigarettes, e-cigarettes, and chewing tobacco. If you need help quitting, ask your health care provider.  Do not use drugs.  If you are sexually active, practice safe sex. Use a condom or other form of protection to prevent STIs (sexually transmitted infections).  If you do not wish to become pregnant, use a form of birth control. If you plan to become pregnant, see your health care provider for a prepregnancy visit.  If told by your health care provider, take low-dose aspirin daily starting at age 50.  Find healthy ways to cope with stress, such as: ? Meditation, yoga, or listening to music. ? Journaling. ? Talking to a trusted person. ? Spending time with friends and family. Safety  Always wear your seat belt while driving or riding in a vehicle.  Do not drive: ? If you have been drinking alcohol. Do not ride with someone who has been drinking. ? When you are tired or distracted. ? While texting.  Wear a helmet and other protective equipment during sports activities.  If you have firearms in your house, make sure you follow all gun safety procedures. What's next?  Visit your health care provider once a year for an annual wellness visit.  Ask your health care provider how often you should have your eyes and teeth checked.  Stay up to date on all vaccines. This information is not intended to replace advice given to you by your health care provider. Make sure you discuss any questions you have with your health care provider. Document Revised: 11/26/2019 Document Reviewed: 11/02/2017 Elsevier Patient Education  2021 Elsevier Inc.  

## 2020-07-21 NOTE — Progress Notes (Signed)
Subjective:     Amy Hodge is a 65 y.o. female and is here for a comprehensive physical exam. The patient reports problems - persistent cough which is worse at night, weight gain, dysuria..  Social History   Socioeconomic History  . Marital status: Married    Spouse name: Not on file  . Number of children: 1  . Years of education: Not on file  . Highest education level: Not on file  Occupational History  . Occupation: disabled    Employer: [employer]  Tobacco Use  . Smoking status: Former Smoker    Packs/day: 2.00    Years: 25.00    Pack years: 50.00    Types: Cigarettes    Quit date: 04/23/1996    Years since quitting: 24.2  . Smokeless tobacco: Never Used  Vaping Use  . Vaping Use: Never used  Substance and Sexual Activity  . Alcohol use: No    Alcohol/week: 0.0 standard drinks  . Drug use: No  . Sexual activity: Not Currently  Other Topics Concern  . Not on file  Social History Narrative   On worker's comp for lower back and right shoulder - applying for disability; losing [employer] insurance 03/07/2010   Social Determinants of Health   Financial Resource Strain: Not on file  Food Insecurity: Not on file  Transportation Needs: Not on file  Physical Activity: Not on file  Stress: Not on file  Social Connections: Not on file  Intimate Partner Violence: Not on file   Health Maintenance  Topic Date Due  . COVID-19 Vaccine (1) Never done  . COLONOSCOPY (Pts 45-59yrs Insurance coverage will need to be confirmed)  02/24/2020  . INFLUENZA VACCINE  10/05/2020  . MAMMOGRAM  07/08/2021  . TETANUS/TDAP  02/25/2025  . Hepatitis C Screening  Completed  . HIV Screening  Completed  . HPV VACCINES  Aged Out  . PAP SMEAR-Modifier  Discontinued    The following portions of the patient's history were reviewed and updated as appropriate: allergies, current medications, past family history, past medical history, past social history, past surgical history and problem  list.  Review of Systems Pertinent items noted in HPI and remainder of comprehensive ROS otherwise negative.   Objective:    BP 124/81   Pulse 82   Temp 97.8 F (36.6 C)   Ht 5\' 5"  (1.651 m)   Wt 202 lb 12.8 oz (92 kg)   SpO2 95%   BMI 33.75 kg/m  General appearance: alert, cooperative and no distress Head: Normocephalic, without obvious abnormality, atraumatic Eyes: conjunctivae/corneas clear. PERRL, EOM's intact. Fundi benign. Ears: normal TM's and external ear canals both ears Nose: Nares normal. Septum midline. Mucosa normal. No drainage or sinus tenderness. Throat: lips, mucosa, and tongue normal; teeth and gums normal Neck: no adenopathy, no JVD, supple, symmetrical, trachea midline and thyroid not enlarged, symmetric, no tenderness/mass/nodules Back: symmetric, no curvature. ROM normal. No CVA tenderness. Lungs: clear to auscultation bilaterally Breasts: Inspection negative Heart: regular rate and rhythm, S1, S2 normal, no murmur, click, rub or gallop Abdomen: soft, non-tender; bowel sounds normal; no masses,  no organomegaly Pelvic: not indicated; status post hysterectomy, negative ROS Extremities: extremities normal, atraumatic, no cyanosis or edema Pulses: 2+ and symmetric Skin: Skin color, texture, turgor normal. No rashes or lesions Lymph nodes: Cervical adenopathy: mildly enlarged submandibular gland and Supraclavicular adenopathy: normal Neurologic: Grossly normal    Assessment:    Healthy female exam.     Plan:  -Discussed most recent labs,  which are essentially within normal limits or stable from prior. Patient reports has not been on Repatha due to worsening cough (contacted hotline and was advised to stop med for a few months). -Recommend to follow up with cardiology and pulmonology as instructed. -Patient is unable to schedule colonoscopy at this time due to transportation issues and advised Cologuard would not be an appropriate alternative due to hx of 2  tubular adenomas. Encourage to arrange transportation and let us know when to place order. -Recommend to use nebulizer for dyspnea related to COPD. -Patient plans to schedule mammogram. -Declined Shingrix. UTD Tdap and pneumococcal.  -Will collect UA and send for urine culture. Pending results will start antibiotic therapy if indicated. -Continue to stay as active as possible, follow a heart healthy diet and recommend to avoid skipping meals. -Follow up in 4 months for HTN, mood, thyroid   See After Visit Summary for Counseling Recommendations

## 2020-07-22 ENCOUNTER — Encounter: Payer: Self-pay | Admitting: Physician Assistant

## 2020-07-22 DIAGNOSIS — N3090 Cystitis, unspecified without hematuria: Secondary | ICD-10-CM

## 2020-07-22 MED ORDER — NITROFURANTOIN MONOHYD MACRO 100 MG PO CAPS: 100.0000 mg | ORAL_CAPSULE | Freq: Two times a day (BID) | ORAL | 0 refills | Status: DC

## 2020-07-25 LAB — URINE CULTURE

## 2020-08-11 ENCOUNTER — Other Ambulatory Visit: Payer: Self-pay | Admitting: *Deleted

## 2020-08-11 ENCOUNTER — Encounter: Payer: Self-pay | Admitting: Physician Assistant

## 2020-08-11 ENCOUNTER — Encounter: Payer: Self-pay | Admitting: *Deleted

## 2020-08-11 DIAGNOSIS — G8929 Other chronic pain: Secondary | ICD-10-CM

## 2020-08-11 DIAGNOSIS — I712 Thoracic aortic aneurysm, without rupture, unspecified: Secondary | ICD-10-CM

## 2020-08-11 DIAGNOSIS — M5440 Lumbago with sciatica, unspecified side: Secondary | ICD-10-CM

## 2020-08-11 MED ORDER — MELOXICAM 15 MG PO TABS: 15.0000 mg | ORAL_TABLET | Freq: Every day | ORAL | 0 refills | Status: DC

## 2020-08-12 ENCOUNTER — Telehealth: Payer: Self-pay | Admitting: Cardiology

## 2020-08-12 NOTE — Telephone Encounter (Signed)
Spoke with patient regarding the Friday 08/28/20 at 11:20 am at [address]--arrival time is 11:00 am for check in----liquids only 4 hours prior.  Will mail information to patient and it is available in  My Chart.  Patient voiced her understanding.

## 2020-08-12 NOTE — Telephone Encounter (Signed)
Left message for patient to call and discuss scheduling the CTA chest/aorta ordered by Dr. Modesto Charon

## 2020-08-28 ENCOUNTER — Ambulatory Visit
Admission: RE | Admit: 2020-08-28 | Discharge: 2020-08-28 | Disposition: A | Discharge status: Home / Self Care | Payer: Medicare HMO | Source: Ambulatory Visit | Attending: Cardiology | Admitting: Cardiology

## 2020-08-28 ENCOUNTER — Other Ambulatory Visit: Payer: Self-pay

## 2020-08-28 DIAGNOSIS — E890 Postprocedural hypothyroidism: Secondary | ICD-10-CM | POA: Diagnosis not present

## 2020-08-28 DIAGNOSIS — I2510 Atherosclerotic heart disease of native coronary artery without angina pectoris: Secondary | ICD-10-CM | POA: Diagnosis not present

## 2020-08-28 DIAGNOSIS — I712 Thoracic aortic aneurysm, without rupture, unspecified: Secondary | ICD-10-CM

## 2020-08-28 DIAGNOSIS — I281 Aneurysm of pulmonary artery: Secondary | ICD-10-CM | POA: Diagnosis not present

## 2020-08-28 MED ORDER — IOPAMIDOL (ISOVUE-370) INJECTION 76%
75.0000 mL | Freq: Once | INTRAVENOUS | Status: AC | PRN
  Administered 2020-08-28: 75 mL via INTRAVENOUS

## 2020-09-04 ENCOUNTER — Telehealth: Payer: Self-pay | Admitting: Primary Care

## 2020-09-04 ENCOUNTER — Ambulatory Visit: Payer: Medicare HMO | Admitting: Primary Care

## 2020-09-04 ENCOUNTER — Encounter: Payer: Self-pay | Admitting: Primary Care

## 2020-09-04 ENCOUNTER — Other Ambulatory Visit: Payer: Self-pay

## 2020-09-04 DIAGNOSIS — J449 Chronic obstructive pulmonary disease, unspecified: Secondary | ICD-10-CM

## 2020-09-04 MED ORDER — HYDROCOD POLST-CPM POLST ER 10-8 MG/5ML PO SUER: 5.0000 mL | Freq: Two times a day (BID) | ORAL | 0 refills | Status: DC | PRN

## 2020-09-04 MED ORDER — BUDESONIDE 0.25 MG/2ML IN SUSP: 0.2500 mg | Freq: Two times a day (BID) | RESPIRATORY_TRACT | 3 refills | Status: DC

## 2020-09-04 NOTE — Patient Instructions (Addendum)
Recommendations: Continue Prilosec, Claritin and Flonase  Take prescription cough medication as needed for cough suppression  Adding budesonide (pulmicort) nebulizer twice a day  Continue to use ipratropium nebulizer as instructed  Let me know how are doing and how you are tolerating medication in the next week   Follow-up: 3-6 months with Dr. Annamaria Boots

## 2020-09-04 NOTE — Telephone Encounter (Signed)
Beth, can you please sent this script to [facility]?  It was sent to [place] mail delivery.  Thank you.

## 2020-09-04 NOTE — Progress Notes (Signed)
@Patient  ID: Amy Hodge, female    DOB: 1955-06-05, 65 y.o.   MRN: [id number]  Chief Complaint  Patient presents with   Follow-up    Referring provider: Lorrene Reid, PA-C  HPI: 65 year old female, former smoker. PMH significant for COPD mixed type, hemoptysis, CAD/MI, RUL nodule, PAH. Patient fo Dr. Annamaria Boots, last seen on 10/31/19.  09/04/2020 Patient presents today with complaints of post covid cough. She has had a cough since she had covid back in September 2021. She lost her husband to covid around the same time. She is compliant with Prilosec for GERD.Tesslon perles have not helped in the past. She is taking delsym without much improvement. She has tried Symbicort, Anoro, Spiriva and Breztri for COPD but did not tolerate due to thrush and palpitation symptoms. CTA imaging with cardiology in June 2022 showed clear lungs.   Significant testing:  a1AT WNL MM 138 6MWT-06/23/2014-94%, 93%, 99%, 452 m on room air. No oxygen limitation. PFT-06/23/14- moderate obstructive airways disease with insignificant response to bronchodilator, normal lung capacity, diffusion mildly reduced. FVC 2.64/70%, FEV1 1.92/65%, FEV1/FVC 0.72 ONOX-04/30/14- 314 minutes with oxygen saturation less than or equal to 88% on room air, qualifying for home O2 during sleep ECHO 05/16/13 EF 60-65%, GR1 diast dysfunction HST- 07/16/15- AHI 6/ hr, desaturation to 82%, body weight 186 lbs  Allergies  Allergen Reactions   Bactrim [Sulfamethoxazole-Trimethoprim] Shortness Of Breath, Anxiety and Palpitations   Ketorolac Shortness Of Breath   Anoro Ellipta [Umeclidinium-Vilanterol] Other (See Comments)    Per patient, caused painful blisters in her mouth.    Morphine Other (See Comments)    Headache    Sulfamethoxazole    Tetracyclines & Related Itching   Trimethoprim    Valsartan Other (See Comments) and Cough    Headaches also    Bystolic [Nebivolol Hcl] Other (See Comments)    Fatigue- Tired feeling    Isosorbide Other (See Comments)    HEADACHES    Levofloxacin Hives   Lisinopril Cough   Metoprolol Other (See Comments)    FATIGUE   Morphine And Related Other (See Comments)    Headache    Simvastatin Other (See Comments)    Makes legs hurt/ sore    Singulair [Montelukast Sodium] Palpitations and Cough   Statins Other (See Comments)    MYALGIAS   Tetracycline Hives and Itching   Toradol [Ketorolac Tromethamine] Palpitations    Immunization History  Administered Date(s) Administered   Influenza Split 12/31/2010   Influenza Whole 11/27/2008, 02/16/2010   Influenza,inj,Quad PF,6+ Mos 12/30/2016, 12/26/2017, 12/21/2018, 01/21/2020   Influenza-Unspecified 05/15/2013, 12/06/2014, 11/05/2015, 12/26/2017   PFIZER(Purple Top)SARS-COV-2 Vaccination 03/16/2020, 04/06/2020   Pneumococcal Conjugate-13 02/15/2013   Pneumococcal Polysaccharide-23 03/07/2012, 01/01/2018   Td 03/07/2004   Tdap 02/26/2015    Past Medical History:  Diagnosis Date   Allergy    Anxiety    COPD (chronic obstructive pulmonary disease) ([place])    Coronary artery disease    a. s/p PCI of RCA in 2015 b. cath in 12/2015 showed 100% Ost RPDA with collaterals present   DDD (degenerative disc disease), cervical    Depression    DJD (degenerative joint disease) of knee    RT   GERD (gastroesophageal reflux disease)    Hashimoto's disease    Headache(784.0)    Hyperlipidemia    Hypertension    Hypothyroidism    Myocardial infarction ([place])    Nocturnal hypoxemia 02/26/2015   On supplemental oxygen therapy    Oxygen 2.5 l/m  at bedtime.   Stented coronary artery 02/26/2015    Tobacco History: Social History   Tobacco Use  Smoking Status Former   Packs/day: 2.00   Years: 25.00   Pack years: 50.00   Types: Cigarettes   Quit date: 04/23/1996   Years since quitting: 24.3  Smokeless Tobacco Never   Counseling given: Not Answered   Outpatient Medications Prior to Visit  Medication Sig Dispense Refill    acetaminophen (TYLENOL) 500 MG tablet Take 1,000 mg by mouth every 8 (eight) hours as needed (for pain or headaches).     albuterol (VENTOLIN HFA) 108 (90 Base) MCG/ACT inhaler Inhale 1-2 puffs into the lungs every 6 (six) hours as needed for wheezing or shortness of breath. 18 g 1   amitriptyline (ELAVIL) 50 MG tablet Take 1 tablet (50 mg total) by mouth at bedtime.     amLODipine (NORVASC) 5 MG tablet Take 1 tablet (5 mg total) by mouth daily. 90 tablet 3   aspirin EC 81 MG tablet Take 81 mg by mouth daily at 6 PM.      B Complex Vitamins (VITAMIN B COMPLEX PO) Take 1 tablet by mouth daily.     buPROPion (WELLBUTRIN XL) 150 MG 24 hr tablet TAKE 1 TABLET EVERY DAY 90 tablet 0   Cholecalciferol (VITAMIN D3) 5000 units CAPS Take 5,000 Units by mouth daily.      cyclobenzaprine (FLEXERIL) 10 MG tablet Take 1 tablet (10 mg total) by mouth 3 (three) times daily as needed for muscle spasms. 30 tablet 1   DULoxetine (CYMBALTA) 20 MG capsule TAKE 2 CAPSULES (40 MG TOTAL) BY MOUTH DAILY. 180 capsule 0   Evolocumab (REPATHA SURECLICK) 299 MG/ML SOAJ Inject 140 mg into the skin every 14 (fourteen) days. 2 mL 11   fexofenadine (ALLEGRA) 180 MG tablet Take 1 tablet (180 mg total) by mouth daily. 90 tablet 3   hydrOXYzine (ATARAX/VISTARIL) 25 MG tablet 1-2 tabs by mouth every 6-8 hrs PRN anxiety 60 tablet 0   ipratropium (ATROVENT) 0.02 % nebulizer solution Take 2.5 mLs (0.5 mg total) by nebulization 4 (four) times daily. 75 mL 12   levothyroxine (SYNTHROID) 175 MCG tablet TAKE 1 TABLET EVERY DAY BEFORE BREAKFAST 90 tablet 0   Multiple Vitamin (MULTIVITAMIN) tablet Take 1 tablet by mouth daily.     nitrofurantoin, macrocrystal-monohydrate, (MACROBID) 100 MG capsule Take 1 capsule (100 mg total) by mouth 2 (two) times daily. 10 capsule 0   nitroGLYCERIN (NITROSTAT) 0.4 MG SL tablet Place 0.4 mg under the tongue every 5 (five) minutes as needed for chest pain.     omeprazole (PRILOSEC) 20 MG capsule TAKE 1  CAPSULE (20 MG TOTAL) BY MOUTH IN THE MORNING AND AT BEDTIME. 180 capsule 0   spironolactone (ALDACTONE) 25 MG tablet TAKE 1 TABLET (25 MG TOTAL) BY MOUTH DAILY. 90 tablet 0   meloxicam (MOBIC) 15 MG tablet Take 1 tablet (15 mg total) by mouth daily. 30 tablet 0   No facility-administered medications prior to visit.   Review of Systems  Review of Systems  Constitutional: Negative.   HENT: Negative.    Respiratory:  Positive for cough.   Cardiovascular: Negative.   Psychiatric/Behavioral: Negative.      Physical Exam  BP 130/90 (BP Location: Left Arm, Patient Position: Sitting, Cuff Size: Large)   Pulse 95   Temp 98.5 F (36.9 C) (Oral)   Ht 5\' 6"  (1.676 m)   Wt 200 lb (90.7 kg)   SpO2 91%  BMI 32.28 kg/m  Physical Exam Constitutional:      Appearance: Normal appearance.  HENT:     Head: Normocephalic and atraumatic.  Cardiovascular:     Rate and Rhythm: Normal rate and regular rhythm.  Pulmonary:     Effort: Pulmonary effort is normal.     Breath sounds: Normal breath sounds. No wheezing, rhonchi or rales.  Skin:    General: Skin is warm and dry.  Neurological:     General: No focal deficit present.     Mental Status: She is alert and oriented to person, place, and time. Mental status is at baseline.     Lab Results:  CBC    Component Value Date/Time   WBC 5.9 07/17/2020 0841   WBC 7.8 08/23/2019 1305   RBC 4.78 07/17/2020 0841   RBC 4.78 08/23/2019 1305   HGB 13.9 07/17/2020 0841   HCT 41.0 07/17/2020 0841   PLT 275 07/17/2020 0841   MCV 86 07/17/2020 0841   MCH 29.1 07/17/2020 0841   MCH 29.7 08/23/2019 1305   MCHC 33.9 07/17/2020 0841   MCHC 32.9 08/23/2019 1305   RDW 12.9 07/17/2020 0841   LYMPHSABS 2.3 10/24/2019 0941   MONOABS 0.5 08/23/2019 1305   EOSABS 0.2 10/24/2019 0941   BASOSABS 0.1 10/24/2019 0941    BMET    Component Value Date/Time   NA 141 07/17/2020 0841   K 4.3 07/17/2020 0841   CL 100 07/17/2020 0841   CO2 22 07/17/2020  0841   GLUCOSE 92 07/17/2020 0841   GLUCOSE 90 08/23/2019 1322   BUN 18 07/17/2020 0841   CREATININE 0.78 07/17/2020 0841   CREATININE 0.79 12/11/2015 1242   CALCIUM 9.2 07/17/2020 0841   GFRNONAA 72 01/21/2020 0927   GFRAA 83 01/21/2020 0927    BNP    Component Value Date/Time   BNP 24.1 06/28/2016 1201    ProBNP No results found for: PROBNP  Imaging: CT ANGIO CHEST AORTA W/CM & OR WO/CM  Result Date: 08/29/2020 CLINICAL DATA:  Follow-up aneurysmal disease of the ascending thoracic aorta. EXAM: CT ANGIOGRAPHY CHEST WITH CONTRAST TECHNIQUE: Multidetector CT imaging of the chest was performed using the standard protocol during bolus administration of intravenous contrast. Multiplanar CT image reconstructions and MIPs were obtained to evaluate the vascular anatomy. CONTRAST:  21mL ISOVUE-370 IOPAMIDOL (ISOVUE-370) INJECTION 76% COMPARISON:  09/05/2019 and additional prior studies. FINDINGS: Cardiovascular: The aortic root measures approximately 3.9-4.1 cm at the level of the sinuses of Valsalva. The ascending thoracic aorta demonstrates stable mild dilatation measuring up to approximately 4.1 cm. The proximal arch measures 3.4 cm and the distal arch 2.8 cm. The descending thoracic aorta measures 2.8 cm. Minimal calcified plaque present at the level of the arch and descending thoracic aorta. Visualized proximal great vessels demonstrate normal patency and normal branching anatomy. Stable mildly dilated central pulmonary arteries with the main pulmonary artery measuring 3.7 cm. The heart size is normal. No pericardial fluid identified. Calcified coronary artery plaque and artifact from RCA stents. Mediastinum/Nodes: Prior thyroidectomy. No mediastinal, hilar or axillary lymphadenopathy identified. Lungs/Pleura: There is no evidence of pulmonary edema, consolidation, pneumothorax, nodule or pleural fluid. Upper Abdomen: Stable irregular patchy enhancement pattern of the liver. Some degree of  steatosis is suspected. Musculoskeletal: No chest wall abnormality. No acute or significant osseous findings. Review of the MIP images confirms the above findings. IMPRESSION: 1. Stable aneurysmal disease of the thoracic aorta with mild aneurysmal dilatation of the ascending thoracic aorta measuring up to 4.1 cm.  Recommend annual imaging followup by CTA or MRA. This recommendation follows 2010 ACCF/AHA/AATS/ACR/ASA/SCA/SCAI/SIR/STS/SVM Guidelines for the Diagnosis and Management of Patients with Thoracic Aortic Disease. Circulation. 2010; 121: S177-L390. Aortic aneurysm NOS (ICD10-I71.9) 2. Stable dilatation of central pulmonary arteries consistent with some degree of underlying pulmonary hypertension. 3. Stable patchy enhancement pattern of the liver with some degree of hepatic steatosis suspected. Aortic aneurysm NOS (ICD10-I71.9). Electronically Signed   By: Aletta Edouard M.D.   On: 08/29/2020 11:58     Assessment & Plan:   COPD mixed type [facility]) - Patient has had a cough since having Covid in September 2022. GERD seems to be controlled on Prilosec. Tesslon and delsym have not helped. Cough is keeping her up at night. She has tried and failed Spiriva, Anoro, Symbicort and Breztri for COPD in the past d/t thrush and palpitation symptoms. CTA imaging in June 2022 showed clear lungs. Recommend trial Budesonide 0.25mg /37ml nebulizer twice daily and sending in RX for tussinex 27ml q12 hours to help with cough suppression. Safe precautions reviewed, limit refills.  FU in 3-6 months with Dr. Annamaria Boots.    Martyn Ehrich, NP 09/08/2020

## 2020-09-04 NOTE — Telephone Encounter (Signed)
Done

## 2020-09-04 NOTE — Telephone Encounter (Signed)
Called and advised patient that prescription was sent into [facility]. Patient verbalized understanding.  Nothing further needed at this time.

## 2020-09-05 ENCOUNTER — Other Ambulatory Visit: Payer: Self-pay | Admitting: Physician Assistant

## 2020-09-05 DIAGNOSIS — G8929 Other chronic pain: Secondary | ICD-10-CM

## 2020-09-08 ENCOUNTER — Encounter: Payer: Self-pay | Admitting: Primary Care

## 2020-09-08 NOTE — Assessment & Plan Note (Addendum)
-   Patient has had a cough since having Covid in September 2022. GERD seems to be controlled on Prilosec. Tesslon and delsym have not helped. Cough is keeping her up at night. She has tried and failed Spiriva, Anoro, Symbicort and Breztri for COPD in the past d/t thrush and palpitation symptoms. CTA imaging in June 2022 showed clear lungs. Recommend trial Budesonide 0.25mg /31ml nebulizer twice daily and sending in RX for tussinex 48ml q12 hours to help with cough suppression. Safe precautions reviewed, limit refills.  FU in 3-6 months with Dr. Annamaria Boots.

## 2020-09-26 DIAGNOSIS — Z20822 Contact with and (suspected) exposure to covid-19: Secondary | ICD-10-CM | POA: Diagnosis not present

## 2020-09-26 DIAGNOSIS — J069 Acute upper respiratory infection, unspecified: Secondary | ICD-10-CM | POA: Diagnosis not present

## 2020-09-26 DIAGNOSIS — J449 Chronic obstructive pulmonary disease, unspecified: Secondary | ICD-10-CM | POA: Diagnosis not present

## 2020-09-26 DIAGNOSIS — Z1152 Encounter for screening for COVID-19: Secondary | ICD-10-CM | POA: Diagnosis not present

## 2020-09-26 DIAGNOSIS — I10 Essential (primary) hypertension: Secondary | ICD-10-CM | POA: Diagnosis not present

## 2020-10-13 ENCOUNTER — Other Ambulatory Visit: Payer: Self-pay | Admitting: Physician Assistant

## 2020-10-25 NOTE — Progress Notes (Signed)
Cardiology Clinic Note   Patient Name: Amy Hodge Date of Encounter: 10/26/2020  Primary Care Provider:  Lorrene Reid, PA-C Primary Cardiologist:  Kirk Ruths, MD  Patient Profile    Amy Hodge 65 year old female presents the clinic today for follow-up evaluation of her coronary artery disease and precordial pain.  Past Medical History    Past Medical History:  Diagnosis Date   Allergy    Anxiety    COPD (chronic obstructive pulmonary disease) ([place])    Coronary artery disease    a. s/p PCI of RCA in 2015 b. cath in 12/2015 showed 100% Ost RPDA with collaterals present   DDD (degenerative disc disease), cervical    Depression    DJD (degenerative joint disease) of knee    RT   GERD (gastroesophageal reflux disease)    Hashimoto's disease    Headache(784.0)    Hyperlipidemia    Hypertension    Hypothyroidism    Myocardial infarction ([place])    Nocturnal hypoxemia 02/26/2015   On supplemental oxygen therapy    Oxygen 2.5 l/m at bedtime.   Stented coronary artery 02/26/2015   Past Surgical History:  Procedure Laterality Date   ABDOMINAL HYSTERECTOMY     APPENDECTOMY  1971   BUNIONECTOMY Right    CARDIAC CATHETERIZATION N/A 09/02/2014   Procedure: Left Heart Cath and Coronary Angiography;  Surgeon: Adrian Prows, MD;  Location: [facility];  Service: Cardiovascular;  Laterality: N/A;   CARDIAC CATHETERIZATION N/A 12/15/2015   Procedure: Left Heart Cath and Coronary Angiography;  Surgeon: Jettie Booze, MD;  Location: [facility];  Service: Cardiovascular;  Laterality: N/A;   CERVICAL FUSION     CESAREAN SECTION     COLONOSCOPY WITH PROPOFOL N/A 02/24/2015   Procedure: COLONOSCOPY WITH PROPOFOL;  Surgeon: Garlan Fair, MD;  Location: [facility];  Service: Endoscopy;  Laterality: N/A;   ESOPHAGOGASTRODUODENOSCOPY (EGD) WITH PROPOFOL N/A 02/24/2015   Procedure: ESOPHAGOGASTRODUODENOSCOPY (EGD) WITH PROPOFOL;  Surgeon: Garlan Fair, MD;  Location: [facility];  Service: Endoscopy;  Laterality: N/A;   HAND SURGERY     due to trauma-dog bite.   I & D EXTREMITY Bilateral 05/17/2013   Procedure: IRRIGATION AND DEBRIDEMENT EXTREMITY;  Surgeon: Roseanne Kaufman, MD;  Location: [facility];  Service: Orthopedics;  Laterality: Bilateral;   LEFT HEART CATHETERIZATION WITH CORONARY ANGIOGRAM N/A 03/04/2014   Procedure: LEFT HEART CATHETERIZATION WITH CORONARY ANGIOGRAM;  Surgeon: Laverda Page, MD;  Location: [facility];  Service: Cardiovascular;  Laterality: N/A;   OOPHORECTOMY     SHOULDER SURGERY     RT   [place]   TOTAL ABDOMINAL HYSTERECTOMY W/ BILATERAL SALPINGOOPHORECTOMY  01/2008   with cervical dysplasia   TUBAL LIGATION  1984    Allergies  Allergies  Allergen Reactions   Bactrim [Sulfamethoxazole-Trimethoprim] Shortness Of Breath, Anxiety and Palpitations   Ketorolac Shortness Of Breath   Anoro Ellipta [Umeclidinium-Vilanterol] Other (See Comments)    Per patient, caused painful blisters in her mouth.    Morphine Other (See Comments)    Headache    Sulfamethoxazole    Tetracyclines & Related Itching   Trimethoprim    Valsartan Other (See Comments) and Cough    Headaches also    Bystolic [Nebivolol Hcl] Other (See Comments)    Fatigue- Tired feeling   Isosorbide Other (See Comments)    HEADACHES    Levofloxacin Hives   Lisinopril Cough   Metoprolol  Other (See Comments)    FATIGUE   Morphine And Related Other (See Comments)    Headache    Simvastatin Other (See Comments)    Makes legs hurt/ sore    Singulair [Montelukast Sodium] Palpitations and Cough   Statins Other (See Comments)    MYALGIAS   Tetracycline Hives and Itching   Toradol [Ketorolac Tromethamine] Palpitations    History of Present Illness    Amy Hodge has a PMH of coronary artery disease, HTN, thoracic aortic aneurysm without rupture, and HLD.  She was previously followed by Dr.  Einar Gip.  She had an NSTEMI [id number] with a complicated PCI to her RCA which was performed in [place].  Her ABIs 12/15 were normal.  Her cardiac catheterization 6/16 showed 100% PDA and patent stents in proximal and mid RCA.  No obstructive disease was noted in her left coronary system and collaterals were noted to the PDA.  PTCA of CTO was unsuccessful.  Her last cardiac catheterization 10/17 showed 100% PDA with left-to-right collaterals, patent RCA stent and normal LV function.  She has a base intolerance which produces cough.  She experienced myalgia while on Crestor.  She was seen and evaluated by Dr. Martinique who did not think attempt at CTO intervention or CABG was appropriate.  Her neck CTA 6/21 showed no significant carotid stenosis.  Neck CTA 6/21 showed no significant carotid stenosis.  CTA 7/21 showed 41 mm a sending aortic aneurysm suggestive of pulmonary hypertension and heterogeneous enhancement of the liver.  Her echocardiogram 7/21 showed normal LV function mild left ventricular hypertrophy, G1 DD, hypokinesis of the lateral wall, trace aortic insufficiency, and mild dilated a sending aortic aneurysm at 42 mm.  She was seen by Dr. Stanford Breed on 04/28/2020.  During that time she continued to have occasional chest pain which she reported as slightly worse than previous.  She did note some dyspnea on exertion.  She underwent a follow-up chest CT 08/28/2020 which showed 41 cm ascending  aorta aneurysm.  Recommendation for repeat scan in 1 year.  She presents the clinic today for follow-up evaluation states she feels well.  We reviewed her recent chest CT and previous cardiac catheterizations.  She reports that she had COVID-19 11 months ago and continues to have a cough which she relates to the infection.  She states that she notices increased work of breathing with increased physical activity and also notices some chest pressure with increased physical activity.  These symptoms resolved with rest.  She  reports that she was instructed to stop taking Repatha due to cough.  She has not taken Repatha in several months.  I will start her on ezetimibe, give her heart healthy high-fiber diet instructions, and have her increase her physical activity as tolerated.  We will repeat her fasting lipids in 8 weeks and have her follow-up in 6 months.  Today she denies chest pain, shortness of breath, lower extremity edema, fatigue, palpitations, melena, hematuria, hemoptysis, diaphoresis, weakness, presyncope, syncope, orthopnea, and PND.   Home Medications    Prior to Admission medications   Medication Sig Start Date End Date Taking? Authorizing Provider  acetaminophen (TYLENOL) 500 MG tablet Take 1,000 mg by mouth every 8 (eight) hours as needed (for pain or headaches).    [provider]  albuterol (VENTOLIN HFA) 108 (90 Base) MCG/ACT inhaler INHALE 1-2 PUFFS INTO THE LUNGS EVERY 6 (SIX) HOURS AS NEEDED FOR WHEEZING OR SHORTNESS OF BREATH. 10/13/20   Lorrene Reid, PA-C  amitriptyline (ELAVIL) 50 MG tablet Take 1 tablet (50 mg total) by mouth at bedtime. 03/23/20   Lorrene Reid, PA-C  amLODipine (NORVASC) 5 MG tablet Take 1 tablet (5 mg total) by mouth daily. 04/28/20   Lelon Perla, MD  aspirin EC 81 MG tablet Take 81 mg by mouth daily at 6 PM.     [provider]  B Complex Vitamins (VITAMIN B COMPLEX PO) Take 1 tablet by mouth daily.    [provider]  budesonide (PULMICORT) 0.25 MG/2ML nebulizer solution Take 2 mLs (0.25 mg total) by nebulization in the morning and at bedtime. 09/04/20   Martyn Ehrich, NP  buPROPion (WELLBUTRIN XL) 150 MG 24 hr tablet TAKE 1 TABLET EVERY DAY 07/09/20   Lorrene Reid, PA-C  chlorpheniramine-HYDROcodone (TUSSIONEX PENNKINETIC ER) 10-8 MG/5ML SUER Take 5 mLs by mouth every 12 (twelve) hours as needed for cough. 09/04/20   Martyn Ehrich, NP  Cholecalciferol (VITAMIN D3) 5000 units CAPS Take 5,000 Units by mouth daily.     [provider]  cyclobenzaprine (FLEXERIL) 10 MG tablet Take 1 tablet (10 mg total) by mouth 3 (three) times daily as needed for muscle spasms. 01/21/20   Lorrene Reid, PA-C  DULoxetine (CYMBALTA) 20 MG capsule TAKE 2 CAPSULES (40 MG TOTAL) BY MOUTH DAILY. 07/09/20   Lorrene Reid, PA-C  Evolocumab (REPATHA SURECLICK) [id number] MG/ML SOAJ Inject 140 mg into the skin every 14 (fourteen) days. 10/28/19   Lelon Perla, MD  fexofenadine (ALLEGRA) 180 MG tablet Take 1 tablet (180 mg total) by mouth daily. 11/13/17   Mellody Dance, DO  hydrOXYzine (ATARAX/VISTARIL) 25 MG tablet 1-2 tabs by mouth every 6-8 hrs PRN anxiety 12/10/19   Abonza, Maritza, PA-C  ipratropium (ATROVENT) 0.02 % nebulizer solution Take 2.5 mLs (0.5 mg total) by nebulization 4 (four) times daily. 07/21/20   Lorrene Reid, PA-C  levothyroxine (SYNTHROID) 175 MCG tablet TAKE 1 TABLET EVERY DAY BEFORE BREAKFAST 06/04/20   Lorrene Reid, PA-C  meloxicam (MOBIC) 15 MG tablet TAKE 1 TABLET EVERY DAY 09/08/20   Lorrene Reid, PA-C  Multiple Vitamin (MULTIVITAMIN) tablet Take 1 tablet by mouth daily.    [provider]  nitrofurantoin, macrocrystal-monohydrate, (MACROBID) 100 MG capsule Take 1 capsule (100 mg total) by mouth 2 (two) times daily. 07/22/20   Lorrene Reid, PA-C  nitroGLYCERIN (NITROSTAT) 0.4 MG SL tablet Place 0.4 mg under the tongue every 5 (five) minutes as needed for chest pain.    [provider]  omeprazole (PRILOSEC) 20 MG capsule TAKE 1 CAPSULE (20 MG TOTAL) BY MOUTH IN THE MORNING AND AT BEDTIME. 06/04/20   Abonza, Maritza, PA-C  spironolactone (ALDACTONE) 25 MG tablet TAKE 1 TABLET (25 MG TOTAL) BY MOUTH DAILY. 06/04/20   Lorrene Reid, PA-C    Family History    Family History  Problem Relation Age of Onset   Lung cancer Mother    Hypertension Mother    Stroke Mother    Diabetes Mother    Diabetes Father    Hypertension Father    Heart attack Father    Asthma Father    COPD Father     Alcohol abuse Father    Diabetes Sister    Hypertension Sister    She indicated that her mother is deceased. She indicated that her father is deceased. She indicated that her sister is alive. She indicated that her maternal grandmother is deceased. She indicated that her maternal grandfather is deceased. She indicated that her paternal  grandmother is deceased. She indicated that her paternal grandfather is deceased.  Social History    Social History   Socioeconomic History   Marital status: Married    Spouse name: Not on file   Number of children: 1   Years of education: Not on file   Highest education level: Not on file  Occupational History   Occupation: disabled    Employer: [employer]  Tobacco Use   Smoking status: Former    Packs/day: 2.00    Years: 25.00    Pack years: 50.00    Types: Cigarettes    Quit date: 04/23/1996    Years since quitting: 24.5   Smokeless tobacco: Never  Vaping Use   Vaping Use: Never used  Substance and Sexual Activity   Alcohol use: No    Alcohol/week: 0.0 standard drinks   Drug use: No   Sexual activity: Not Currently  Other Topics Concern   Not on file  Social History Narrative   On worker's comp for lower back and right shoulder - applying for disability; losing [employer] insurance 03/07/2010   Social Determinants of Health   Financial Resource Strain: Not on file  Food Insecurity: Not on file  Transportation Needs: Not on file  Physical Activity: Not on file  Stress: Not on file  Social Connections: Not on file  Intimate Partner Violence: Not on file     Review of Systems    General:  No chills, fever, night sweats or weight changes.  Cardiovascular:  No chest pain, dyspnea on exertion, edema, orthopnea, palpitations, paroxysmal nocturnal dyspnea. Dermatological: No rash, lesions/masses Respiratory: No cough, dyspnea Urologic: No hematuria, dysuria Abdominal:   No nausea, vomiting, diarrhea, bright red blood per rectum, melena,  or hematemesis Neurologic:  No visual changes, wkns, changes in mental status. All other systems reviewed and are otherwise negative except as noted above.  Physical Exam    VS:  BP 110/80   Pulse 87   Resp 20   Ht '5\' 6"'$  (1.676 m)   Wt 199 lb 3.2 oz (90.4 kg)   SpO2 93%   BMI 32.15 kg/m  , BMI Body mass index is 32.15 kg/m. GEN: Well nourished, well developed, in no acute distress. HEENT: normal. Neck: Supple, no JVD, carotid bruits, or masses. Cardiac: RRR, no murmurs, rubs, or gallops. No clubbing, cyanosis, edema.  Radials/DP/PT 2+ and equal bilaterally.  Respiratory:  Respirations regular and unlabored, clear to auscultation bilaterally. GI: Soft, nontender, nondistended, BS + x 4. MS: no deformity or atrophy. Skin: warm and dry, no rash. Neuro:  Strength and sensation are intact. Psych: Normal affect.  Accessory Clinical Findings    Recent Labs: 07/17/2020: ALT 11; BUN 18; Creatinine, Ser 0.78; Hemoglobin 13.9; Platelets 275; Potassium 4.3; Sodium 141; TSH 2.850   Recent Lipid Panel    Component Value Date/Time   CHOL 192 07/17/2020 0841   TRIG 134 07/17/2020 0841   HDL 54 07/17/2020 0841   CHOLHDL 3.6 07/17/2020 0841   CHOLHDL 5.8 (H) 12/11/2015 1242   VLDL 48 (H) 12/11/2015 1242   LDLCALC 114 (H) 07/17/2020 0841   LDLDIRECT 40 01/21/2020 0927   LDLDIRECT 143.0 11/05/2015 1614    ECG personally reviewed by me today-normal sinus rhythm no ST or T wave deviation 87 bpm- No acute changes  Echocardiogram 09/10/2019 IMPRESSIONS     1. Left ventricular ejection fraction, by estimation, is 50 to 55%. The  left ventricle has low normal function. The left ventricle has no regional  wall motion abnormalities. There is mild left ventricular hypertrophy.  Left ventricular diastolic  parameters are consistent with Grade I diastolic dysfunction (impaired  relaxation). There is moderate hypokinesis of the left ventricular,  basal-mid lateral wall.   2. Right ventricular  systolic function is normal. The right ventricular  size is normal. There is mildly elevated pulmonary artery systolic  pressure.   3. The mitral valve is grossly normal. Trivial mitral valve  regurgitation.   4. The aortic valve is tricuspid. Aortic valve regurgitation is trivial.  Aortic regurgitation PHT measures 698 msec.   5. Aortic dilatation noted. There is mild to moderate dilatation of the  ascending aorta measuring 42 mm.   6. The inferior vena cava is normal in size with greater than 50%  respiratory variability, suggesting right atrial pressure of 3 mmHg.   Comparison(s): Changes from prior study are noted. 05/16/13: LVEF 60-65%,  normal wall motion.   Chest CT 08/28/2020 FINDINGS: Cardiovascular: The aortic root measures approximately 3.9-4.1 cm at the level of the sinuses of Valsalva. The ascending thoracic aorta demonstrates stable mild dilatation measuring up to approximately 4.1 cm. The proximal arch measures 3.4 cm and the distal arch 2.8 cm. The descending thoracic aorta measures 2.8 cm. Minimal calcified plaque present at the level of the arch and descending thoracic aorta.   Visualized proximal great vessels demonstrate normal patency and normal branching anatomy. Stable mildly dilated central pulmonary arteries with the main pulmonary artery measuring 3.7 cm. The heart size is normal. No pericardial fluid identified. Calcified coronary artery plaque and artifact from RCA stents.   Mediastinum/Nodes: Prior thyroidectomy. No mediastinal, hilar or axillary lymphadenopathy identified.   Lungs/Pleura: There is no evidence of pulmonary edema, consolidation, pneumothorax, nodule or pleural fluid.   Upper Abdomen: Stable irregular patchy enhancement pattern of the liver. Some degree of steatosis is suspected.   Musculoskeletal: No chest wall abnormality. No acute or significant osseous findings.   Review of the MIP images confirms the above findings.    IMPRESSION: 1. Stable aneurysmal disease of the thoracic aorta with mild aneurysmal dilatation of the ascending thoracic aorta measuring up to 4.1 cm. Recommend annual imaging followup by CTA or MRA. This recommendation follows 2010 ACCF/AHA/AATS/ACR/ASA/SCA/SCAI/SIR/STS/SVM Guidelines for the Diagnosis and Management of Patients with Thoracic Aortic Disease. Circulation. 2010; [id number]. Aortic aneurysm NOS (ICD10-I71.9) 2. Stable dilatation of central pulmonary arteries consistent with some degree of underlying pulmonary hypertension. 3. Stable patchy enhancement pattern of the liver with some degree of hepatic steatosis suspected.   Aortic aneurysm NOS (ICD10-I71.9).     Electronically Signed   By: Aletta Edouard M.D.   On: 08/29/2020 11:58  Assessment & Plan   1.  Ascending aortic aneurysm-no recent episodes of back or chest discomfort.  Chest CT 08/28/2020 showed stable aneurysm at 41 mm. Continue good blood pressure control Repeat CT chest 6/23  Coronary artery disease-remains physically active.  Notes chest pressure with increased physical activity and increased work of breathing.  Her symptoms appear to be related to her COPD.  Statin intolerant.  Cardiac catheterization 10/17 showed 100% stenosed RPDA with left-to-right collaterals, patent RCA stent with mild in-stent restenosis, normal LVEF.  Medical management recommended Continue aspirin, amlodipine Heart healthy low-sodium diet-salty 6 given Increase physical activity as tolerated  Hyperlipidemia-07/17/2020: Cholesterol, Total 192; HDL 54; LDL Chol Calc (NIH) 114; Triglycerides 134 Start Zetia 10 mg daily Heart healthy low-sodium high-fiber diet Increase physical activity as tolerated Lipids in 8 weeks   Essential hypertension-BP today  110/80.  Well-controlled at home. Continue spironolactone, amlodipine Heart healthy low-sodium diet-salty 6 given Increase physical activity as tolerated Keep BP  log  Disposition: Follow-up with Dr. Stanford Breed 6-9 months.  Jossie Ng. Jalecia Leon NP-C    10/26/2020, 8:28 AM [address] Office [phone] Fax [phone]  Notice: This dictation was prepared with Dragon dictation along with smaller phrase technology. Any transcriptional errors that result from this process are unintentional and may not be corrected upon review.  I spent 13 minutes examining this patient, reviewing medications, and using patient centered shared decision making involving her cardiac care.  Prior to her visit I spent greater than 20 minutes reviewing her past medical history,  medications, and prior cardiac tests.

## 2020-10-26 ENCOUNTER — Other Ambulatory Visit: Payer: Self-pay

## 2020-10-26 ENCOUNTER — Encounter: Payer: Self-pay | Admitting: General Practice

## 2020-10-26 ENCOUNTER — Ambulatory Visit: Payer: Medicare HMO | Admitting: General Practice

## 2020-10-26 VITALS — BP 110/80 | HR 87 | Resp 20 | Ht 66.0 in | Wt 199.2 lb

## 2020-10-26 DIAGNOSIS — Z79899 Other long term (current) drug therapy: Secondary | ICD-10-CM

## 2020-10-26 DIAGNOSIS — I712 Thoracic aortic aneurysm, without rupture, unspecified: Secondary | ICD-10-CM

## 2020-10-26 DIAGNOSIS — E7800 Pure hypercholesterolemia, unspecified: Secondary | ICD-10-CM

## 2020-10-26 DIAGNOSIS — I10 Essential (primary) hypertension: Secondary | ICD-10-CM

## 2020-10-26 DIAGNOSIS — I2510 Atherosclerotic heart disease of native coronary artery without angina pectoris: Secondary | ICD-10-CM

## 2020-10-26 MED ORDER — EZETIMIBE 10 MG PO TABS: 10.0000 mg | ORAL_TABLET | Freq: Every day | ORAL | 3 refills | Status: DC

## 2020-10-26 NOTE — Addendum Note (Signed)
Addended by: Waylan Rocher on: 10/26/2020 08:36 AM   Modules accepted: Orders

## 2020-10-26 NOTE — Patient Instructions (Addendum)
Medication Instructions:  START ZETIA '10MG'$  DIALY   *If you need a refill on your cardiac medications before your next appointment, please call your pharmacy*  Lab Work:    FASTING LIPID PANEL IN 8 WEEKS ( END OF OCTOBER )      Special Instructions PLEASE [place] DIET-ATTACHED-1,'800mg'$  daily  PLEASE MAINTAIN PHYSICAL ACTIVITY AS TOLERATED  TAKE AND LOG YOUR BLOOD PRESSURE ONCE A WEEK  Follow-Up: Your next appointment:  6 month(s) In Person with Kirk Ruths, MD   At [facility], you and your health needs are our priority.  As part of our continuing mission to provide you with exceptional heart care, we have created designated Provider Care Teams.  These Care Teams include your primary Cardiologist (physician) and Advanced Practice Providers (APPs -  Physician Assistants and Nurse Practitioners) who all work together to provide you with the care you need, when you need it.          High-Fiber Eating Plan Fiber, also called dietary fiber, is a type of carbohydrate. It is found foods such as fruits, vegetables, whole grains, and beans. A high-fiber diet can have many health benefits. Your health care provider may recommend a high-fiber diet to help: Prevent constipation. Fiber can make your bowel movements more regular. Lower your cholesterol. Relieve the following conditions: Inflammation of veins in the anus (hemorrhoids). Inflammation of specific areas of the digestive tract (uncomplicated diverticulosis). A problem of the large intestine, also called the colon, that sometimes causes pain and diarrhea (irritable bowel syndrome, or IBS). Prevent overeating as part of a weight-loss plan. Prevent heart disease, type 2 diabetes, and certain cancers. What are tips for following this plan? Reading food labels  Check the nutrition facts label on food products for the amount of dietary fiber. Choose foods that have 5 grams of fiber or more per serving. The goals  for recommended daily fiber intake include: Men (age 43 or younger): 34-38 g. Men (over age 61): 28-34 g. Women (age 37 or younger): 25-28 g. Women (over age 24): 22-25 g. Your daily fiber goal is _____________ g. Shopping Choose whole fruits and vegetables instead of processed forms, such as apple juice or applesauce. Choose a wide variety of high-fiber foods such as avocados, lentils, oats, and kidney beans. Read the nutrition facts label of the foods you choose. Be aware of foods with added fiber. These foods often have high sugar and sodium amounts per serving. Cooking Use whole-grain flour for baking and cooking. Cook with brown rice instead of white rice. Meal planning Start the day with a breakfast that is high in fiber, such as a cereal that contains 5 g of fiber or more per serving. Eat breads and cereals that are made with whole-grain flour instead of refined flour or white flour. Eat brown rice, bulgur wheat, or millet instead of white rice. Use beans in place of meat in soups, salads, and pasta dishes. Be sure that half of the grains you eat each day are whole grains. General information You can get the recommended daily intake of dietary fiber by: Eating a variety of fruits, vegetables, grains, nuts, and beans. Taking a fiber supplement if you are not able to take in enough fiber in your diet. It is better to get fiber through food than from a supplement. Gradually increase how much fiber you consume. If you increase your intake of dietary fiber too quickly, you may have bloating, cramping, or gas. Drink plenty of water  to help you digest fiber. Choose high-fiber snacks, such as berries, raw vegetables, nuts, and popcorn. What foods should I eat? Fruits Berries. Pears. Apples. Oranges. Avocado. Prunes and raisins. Dried figs. Vegetables Sweet potatoes. Spinach. Kale. Artichokes. Cabbage. Broccoli. Cauliflower.Green peas. Carrots. Squash. Grains Whole-grain breads.  Multigrain cereal. Oats and oatmeal. Brown rice. Barley.Bulgur wheat. [place]. Quinoa. Bran muffins. Popcorn. Rye wafer crackers. Meats and other proteins Navy beans, kidney beans, and pinto beans. Soybeans. Split peas. Lentils. Nutsand seeds. Dairy Fiber-fortified yogurt. Beverages Fiber-fortified soy milk. Fiber-fortified orange juice. Other foods Fiber bars. The items listed above may not be a complete list of recommended foods and beverages. Contact a dietitian for more information. What foods should I avoid? Fruits Fruit juice. Cooked, strained fruit. Vegetables Fried potatoes. Canned vegetables. Well-cooked vegetables. Grains White bread. Pasta made with refined flour. White rice. Meats and other proteins Fatty cuts of meat. Fried chicken or fried fish. Dairy Milk. Yogurt. Cream cheese. Sour cream. Fats and oils Butters. Beverages Soft drinks. Other foods Cakes and pastries. The items listed above may not be a complete list of foods and beverages to avoid. Talk with your dietitian about what choices are best for you. Summary Fiber is a type of carbohydrate. It is found in foods such as fruits, vegetables, whole grains, and beans. A high-fiber diet has many benefits. It can help to prevent constipation, lower blood cholesterol, aid weight loss, and reduce your risk of heart disease, diabetes, and certain cancers. Increase your intake of fiber gradually. Increasing fiber too quickly may cause cramping, bloating, and gas. Drink plenty of water while you increase the amount of fiber you consume. The best sources of fiber include whole fruits and vegetables, whole grains, nuts, seeds, and beans. This information is not intended to replace advice given to you by your health care provider. Make sure you discuss any questions you have with your healthcare provider. Document Revised: 06/27/2019 Document Reviewed: 06/27/2019 Elsevier Patient Education  2022 [employer].

## 2020-10-30 ENCOUNTER — Other Ambulatory Visit: Payer: Self-pay | Admitting: Physician Assistant

## 2020-10-30 DIAGNOSIS — G8929 Other chronic pain: Secondary | ICD-10-CM

## 2020-10-30 DIAGNOSIS — M5440 Lumbago with sciatica, unspecified side: Secondary | ICD-10-CM

## 2020-11-20 ENCOUNTER — Ambulatory Visit: Payer: Medicare HMO | Admitting: Physician Assistant

## 2020-11-21 ENCOUNTER — Other Ambulatory Visit: Payer: Self-pay | Admitting: Physician Assistant

## 2020-11-21 DIAGNOSIS — E890 Postprocedural hypothyroidism: Secondary | ICD-10-CM

## 2020-11-23 ENCOUNTER — Encounter: Payer: Self-pay | Admitting: Physician Assistant

## 2020-11-23 ENCOUNTER — Ambulatory Visit (INDEPENDENT_AMBULATORY_CARE_PROVIDER_SITE_OTHER): Payer: Medicare HMO | Admitting: Physician Assistant

## 2020-11-23 ENCOUNTER — Other Ambulatory Visit: Payer: Self-pay

## 2020-11-23 VITALS — BP 116/78 | HR 94 | Temp 99.0°F | Ht 66.5 in | Wt 199.0 lb

## 2020-11-23 DIAGNOSIS — M62830 Muscle spasm of back: Secondary | ICD-10-CM | POA: Diagnosis not present

## 2020-11-23 DIAGNOSIS — J449 Chronic obstructive pulmonary disease, unspecified: Secondary | ICD-10-CM | POA: Diagnosis not present

## 2020-11-23 DIAGNOSIS — F4323 Adjustment disorder with mixed anxiety and depressed mood: Secondary | ICD-10-CM | POA: Diagnosis not present

## 2020-11-23 DIAGNOSIS — M791 Myalgia, unspecified site: Secondary | ICD-10-CM | POA: Diagnosis not present

## 2020-11-23 DIAGNOSIS — K219 Gastro-esophageal reflux disease without esophagitis: Secondary | ICD-10-CM | POA: Diagnosis not present

## 2020-11-23 DIAGNOSIS — G4700 Insomnia, unspecified: Secondary | ICD-10-CM | POA: Diagnosis not present

## 2020-11-23 DIAGNOSIS — I10 Essential (primary) hypertension: Secondary | ICD-10-CM

## 2020-11-23 DIAGNOSIS — J0140 Acute pansinusitis, unspecified: Secondary | ICD-10-CM | POA: Diagnosis not present

## 2020-11-23 DIAGNOSIS — E890 Postprocedural hypothyroidism: Secondary | ICD-10-CM | POA: Diagnosis not present

## 2020-11-23 MED ORDER — AMITRIPTYLINE HCL 50 MG PO TABS: 50.0000 mg | ORAL_TABLET | Freq: Every day | ORAL | 0 refills | Status: DC

## 2020-11-23 MED ORDER — BUPROPION HCL ER (XL) 150 MG PO TB24
150.0000 mg | ORAL_TABLET | Freq: Every day | ORAL | 0 refills | Status: DC
Start: 2020-11-23 — End: 2020-12-21

## 2020-11-23 MED ORDER — OMEPRAZOLE 20 MG PO CPDR: 20.0000 mg | DELAYED_RELEASE_CAPSULE | Freq: Two times a day (BID) | ORAL | 0 refills | Status: DC

## 2020-11-23 MED ORDER — LEVOTHYROXINE SODIUM 175 MCG PO TABS: 175.0000 ug | ORAL_TABLET | Freq: Every day | ORAL | 0 refills | Status: DC

## 2020-11-23 MED ORDER — SPIRONOLACTONE 25 MG PO TABS: 25.0000 mg | ORAL_TABLET | Freq: Every day | ORAL | 0 refills | Status: DC

## 2020-11-23 MED ORDER — DULOXETINE HCL 20 MG PO CPEP: 40.0000 mg | ORAL_CAPSULE | Freq: Every day | ORAL | 0 refills | Status: DC

## 2020-11-23 MED ORDER — AZITHROMYCIN 250 MG PO TABS: ORAL_TABLET | ORAL | 0 refills | Status: AC

## 2020-11-23 MED ORDER — CYCLOBENZAPRINE HCL 10 MG PO TABS: 10.0000 mg | ORAL_TABLET | Freq: Three times a day (TID) | ORAL | 1 refills | Status: DC | PRN

## 2020-11-23 NOTE — Progress Notes (Signed)
Established Patient Office Visit  Subjective:  Patient ID: Amy Hodge, female    DOB: April 19, 1955  Age: 65 y.o. MRN: [id number]  CC:  Chief Complaint  Patient presents with   Follow-up    HPI Amy Hodge presents for follow up on hypertension, mood and hypothyroid. Has c/o frontal sinus pressure, headache, low grade-fever, nasal congestion x 4 days and has noticed some wheezing recently. Denies n/v/d, chills, night sweats. Also reports was started on a new cholesterol medication (Zetia) which she did not tolerate and stopped. Reports had significant joint pain and stiffness. Requesting medication refills including flexeril which she occasionally takes if needed. Since her husband passed away last year she has in charge of doing house work including yard work.   HTN: Pt denies new onset of chest pain, palpitations, dizziness or worsening lower extremity swelling. Patient does report chest pain which resolves after resting, recently saw cardiology. Taking medication as directed without side effects.   COPD: Reports was started on Budesonide which made her cough worse, so has not been using. Has a follow up visit with pulmonology next month.  Mood: Reports overall mood has been stable. Does have good and bad days especially on special occasions or dates that remind her of her deceased husband. Denies SI/HI. Reports medication compliance.   Thyroid: Taking medication as directed without issues. Does report fatigue which is a chronic issue.   Past Medical History:  Diagnosis Date   Allergy    Anxiety    COPD (chronic obstructive pulmonary disease) ([place])    Coronary artery disease    a. s/p PCI of RCA in 2015 b. cath in 12/2015 showed 100% Ost RPDA with collaterals present   DDD (degenerative disc disease), cervical    Depression    DJD (degenerative joint disease) of knee    RT   GERD (gastroesophageal reflux disease)    Hashimoto's disease    Headache(784.0)     Hyperlipidemia    Hypertension    Hypothyroidism    Myocardial infarction ([place])    Nocturnal hypoxemia 02/26/2015   On supplemental oxygen therapy    Oxygen 2.5 l/m at bedtime.   Stented coronary artery 02/26/2015    Past Surgical History:  Procedure Laterality Date   ABDOMINAL HYSTERECTOMY     APPENDECTOMY  1971   BUNIONECTOMY Right    CARDIAC CATHETERIZATION N/A 09/02/2014   Procedure: Left Heart Cath and Coronary Angiography;  Surgeon: Adrian Prows, MD;  Location: [facility];  Service: Cardiovascular;  Laterality: N/A;   CARDIAC CATHETERIZATION N/A 12/15/2015   Procedure: Left Heart Cath and Coronary Angiography;  Surgeon: Jettie Booze, MD;  Location: [facility];  Service: Cardiovascular;  Laterality: N/A;   CERVICAL FUSION     CESAREAN SECTION     COLONOSCOPY WITH PROPOFOL N/A 02/24/2015   Procedure: COLONOSCOPY WITH PROPOFOL;  Surgeon: Garlan Fair, MD;  Location: [facility];  Service: Endoscopy;  Laterality: N/A;   ESOPHAGOGASTRODUODENOSCOPY (EGD) WITH PROPOFOL N/A 02/24/2015   Procedure: ESOPHAGOGASTRODUODENOSCOPY (EGD) WITH PROPOFOL;  Surgeon: Garlan Fair, MD;  Location: [facility];  Service: Endoscopy;  Laterality: N/A;   HAND SURGERY     due to trauma-dog bite.   I & D EXTREMITY Bilateral 05/17/2013   Procedure: IRRIGATION AND DEBRIDEMENT EXTREMITY;  Surgeon: Roseanne Kaufman, MD;  Location: [facility];  Service: Orthopedics;  Laterality: Bilateral;   LEFT HEART CATHETERIZATION WITH CORONARY ANGIOGRAM N/A 03/04/2014   Procedure: LEFT HEART CATHETERIZATION WITH CORONARY  ANGIOGRAM;  Surgeon: Laverda Page, MD;  Location: [facility];  Service: Cardiovascular;  Laterality: N/A;   OOPHORECTOMY     SHOULDER SURGERY     RT   [place]   TOTAL ABDOMINAL HYSTERECTOMY W/ BILATERAL SALPINGOOPHORECTOMY  01/2008   with cervical dysplasia   TUBAL LIGATION  1984    Family History  Problem Relation Age of Onset   Lung  cancer Mother    Hypertension Mother    Stroke Mother    Diabetes Mother    Diabetes Father    Hypertension Father    Heart attack Father    Asthma Father    COPD Father    Alcohol abuse Father    Diabetes Sister    Hypertension Sister     Social History   Socioeconomic History   Marital status: Married    Spouse name: Not on file   Number of children: 1   Years of education: Not on file   Highest education level: Not on file  Occupational History   Occupation: disabled    Employer: [employer]  Tobacco Use   Smoking status: Former    Packs/day: 2.00    Years: 25.00    Pack years: 50.00    Types: Cigarettes    Quit date: 04/23/1996    Years since quitting: 24.6   Smokeless tobacco: Never  Vaping Use   Vaping Use: Never used  Substance and Sexual Activity   Alcohol use: No    Alcohol/week: 0.0 standard drinks   Drug use: No   Sexual activity: Not Currently  Other Topics Concern   Not on file  Social History Narrative   On worker's comp for lower back and right shoulder - applying for disability; losing [employer] insurance 03/07/2010   Social Determinants of Health   Financial Resource Strain: Not on file  Food Insecurity: Not on file  Transportation Needs: Not on file  Physical Activity: Not on file  Stress: Not on file  Social Connections: Not on file  Intimate Partner Violence: Not on file    Outpatient Medications Prior to Visit  Medication Sig Dispense Refill   acetaminophen (TYLENOL) 500 MG tablet Take 1,000 mg by mouth every 8 (eight) hours as needed (for pain or headaches).     albuterol (VENTOLIN HFA) 108 (90 Base) MCG/ACT inhaler INHALE 1-2 PUFFS INTO THE LUNGS EVERY 6 (SIX) HOURS AS NEEDED FOR WHEEZING OR SHORTNESS OF BREATH. 1 each 1   amLODipine (NORVASC) 5 MG tablet Take 1 tablet (5 mg total) by mouth daily. 90 tablet 3   aspirin EC 81 MG tablet Take 81 mg by mouth daily at 6 PM.      B Complex Vitamins (VITAMIN B COMPLEX PO) Take 1 tablet by mouth  daily.     budesonide (PULMICORT) 0.25 MG/2ML nebulizer solution Take 2 mLs (0.25 mg total) by nebulization in the morning and at bedtime. 60 mL 3   chlorpheniramine-HYDROcodone (TUSSIONEX PENNKINETIC ER) 10-8 MG/5ML SUER Take 5 mLs by mouth every 12 (twelve) hours as needed for cough. 140 mL 0   Cholecalciferol (VITAMIN D3) 5000 units CAPS Take 5,000 Units by mouth daily.      ezetimibe (ZETIA) 10 MG tablet Take 1 tablet (10 mg total) by mouth daily. 30 tablet 3   fexofenadine (ALLEGRA) 180 MG tablet Take 1 tablet (180 mg total) by mouth daily. 90 tablet 3   hydrOXYzine (ATARAX/VISTARIL) 25 MG tablet 1-2 tabs by mouth  every 6-8 hrs PRN anxiety 60 tablet 0   ipratropium (ATROVENT) 0.02 % nebulizer solution Take 2.5 mLs (0.5 mg total) by nebulization 4 (four) times daily. 75 mL 12   levothyroxine (SYNTHROID) 175 MCG tablet Take 1 tablet (175 mcg total) by mouth daily before breakfast. 90 tablet 0   meloxicam (MOBIC) 15 MG tablet TAKE 1 TABLET EVERY DAY 60 tablet 0   Multiple Vitamin (MULTIVITAMIN) tablet Take 1 tablet by mouth daily.     nitroGLYCERIN (NITROSTAT) 0.4 MG SL tablet Place 0.4 mg under the tongue every 5 (five) minutes as needed for chest pain.     amitriptyline (ELAVIL) 50 MG tablet Take 1 tablet (50 mg total) by mouth at bedtime.     buPROPion (WELLBUTRIN XL) 150 MG 24 hr tablet TAKE 1 TABLET EVERY DAY 90 tablet 0   cyclobenzaprine (FLEXERIL) 10 MG tablet Take 1 tablet (10 mg total) by mouth 3 (three) times daily as needed for muscle spasms. 30 tablet 1   DULoxetine (CYMBALTA) 20 MG capsule TAKE 2 CAPSULES (40 MG TOTAL) BY MOUTH DAILY. 180 capsule 0   omeprazole (PRILOSEC) 20 MG capsule TAKE 1 CAPSULE (20 MG TOTAL) BY MOUTH IN THE MORNING AND AT BEDTIME. 180 capsule 0   spironolactone (ALDACTONE) 25 MG tablet TAKE 1 TABLET (25 MG TOTAL) BY MOUTH DAILY. 90 tablet 0   No facility-administered medications prior to visit.    Allergies  Allergen Reactions   Bactrim  [Sulfamethoxazole-Trimethoprim] Shortness Of Breath, Anxiety and Palpitations   Ketorolac Shortness Of Breath   Anoro Ellipta [Umeclidinium-Vilanterol] Other (See Comments)    Per patient, caused painful blisters in her mouth.    Morphine Other (See Comments)    Headache    Sulfamethoxazole    Tetracyclines & Related Itching   Trimethoprim    Valsartan Other (See Comments) and Cough    Headaches also    Bystolic [Nebivolol Hcl] Other (See Comments)    Fatigue- Tired feeling   Isosorbide Other (See Comments)    HEADACHES    Levofloxacin Hives   Lisinopril Cough   Metoprolol Other (See Comments)    FATIGUE   Morphine And Related Other (See Comments)    Headache    Simvastatin Other (See Comments)    Makes legs hurt/ sore    Singulair [Montelukast Sodium] Palpitations and Cough   Statins Other (See Comments)    MYALGIAS   Tetracycline Hives and Itching   Toradol [Ketorolac Tromethamine] Palpitations    ROS Review of Systems Review of Systems:  A fourteen system review of systems was performed and found to be positive as per HPI.   Objective:    Physical Exam General:  Well Developed, well nourished, appropriate for stated age.  Neuro:  Alert and oriented,  extra-ocular muscles intact  HEENT:  Normocephalic, atraumatic, neck supple, tenderness of frontal and maxillary sinus, normal TM's of both ears, slight boggy turbinates, +cervical adenopathy  Skin:  no gross rash, warm, pink. Cardiac:  RRR, S1 S2, no murmur  Respiratory: Wheezing noted, no rhonchi or rales. Not using accessory muscles, speaking in full sentences- unlabored. Vascular:  Ext warm, no cyanosis apprec.; cap RF less 2 sec. No edema  Psych:  No HI/SI, judgement and insight good, Euthymic mood. Full Affect.  BP 116/78   Pulse 94   Temp 99 F (37.2 C)   Ht 5' 6.5" (1.689 m)   Wt 199 lb (90.3 kg)   SpO2 97%   BMI 31.64 kg/m  Wt Readings  from Last 3 Encounters:  11/23/20 199 lb (90.3 kg)  10/26/20  199 lb 3.2 oz (90.4 kg)  09/04/20 200 lb (90.7 kg)     Health Maintenance Due  Topic Date Due   Zoster Vaccines- Shingrix (1 of 2) Never done   COLONOSCOPY (Pts 45-42yr Insurance coverage will need to be confirmed)  02/24/2020   COVID-19 Vaccine (3 - Pfizer risk series) 05/04/2020   INFLUENZA VACCINE  10/05/2020    There are no preventive care reminders to display for this patient.  Lab Results  Component Value Date   TSH 2.850 07/17/2020   Lab Results  Component Value Date   WBC 5.9 07/17/2020   HGB 13.9 07/17/2020   HCT 41.0 07/17/2020   MCV 86 07/17/2020   PLT 275 07/17/2020   Lab Results  Component Value Date   NA 141 07/17/2020   K 4.3 07/17/2020   CO2 22 07/17/2020   GLUCOSE 92 07/17/2020   BUN 18 07/17/2020   CREATININE 0.78 07/17/2020   BILITOT 0.3 07/17/2020   ALKPHOS 91 07/17/2020   AST 21 07/17/2020   ALT 11 07/17/2020   PROT 6.7 07/17/2020   ALBUMIN 4.4 07/17/2020   CALCIUM 9.2 07/17/2020   ANIONGAP 10 08/23/2019   EGFR 86 07/17/2020   GFR 66.63 11/05/2015   Lab Results  Component Value Date   CHOL 192 07/17/2020   Lab Results  Component Value Date   HDL 54 07/17/2020   Lab Results  Component Value Date   LDLCALC 114 (H) 07/17/2020   Lab Results  Component Value Date   TRIG 134 07/17/2020   Lab Results  Component Value Date   CHOLHDL 3.6 07/17/2020   Lab Results  Component Value Date   HGBA1C 5.4 07/17/2020      Assessment & Plan:   Problem List Items Addressed This Visit       Cardiovascular and Mediastinum   HTN, goal below 130/80 - Primary    -Controlled. -Continue current medication regimen. -Will continue to monitor.      Relevant Medications   spironolactone (ALDACTONE) 25 MG tablet   Other Relevant Orders   CBC w/Diff   Comp Met (CMET)     Respiratory   [place]D [place] type [place]    -Followed by pulmonology.       Relevant Medications   azithromycin (ZITHROMAX) 250 MG tablet     Digestive   GERD    Relevant Medications   omeprazole (PRILOSEC) 20 MG capsule     Endocrine   post-operative Hypothyroidism (Chronic)    -Last TSH wnl -Continue current medication regimen. -Rechecking thyroid labs today. Pending results will make medication adjustments if indicated.       Relevant Orders   TSH   T4, free     Other   Myalgia/ generalized fibromyalgia (Chronic)    -Stable. -Continue current medication regimen. -Recommend stretches.      Relevant Medications   DULoxetine (CYMBALTA) 20 MG capsule   Adjustment disorder with mixed anxiety and depressed mood    -Stable. PHQ-9 score of 0. -Continue current medication regimen.      Relevant Medications   amitriptyline (ELAVIL) 50 MG tablet   buPROPion (WELLBUTRIN XL) 150 MG 24 hr tablet   Spasm of muscle of lower back    -Stable. -Continue muscle relaxer as needed.      Relevant Medications   cyclobenzaprine (FLEXERIL) 10 MG tablet   Other Visit Diagnoses     Insomnia, unspecified type  Relevant Medications   amitriptyline (ELAVIL) 50 MG tablet   Acute non-recurrent pansinusitis       Relevant Medications   azithromycin (ZITHROMAX) 250 MG tablet      Acute non-recurrent pansinusitis: -Due to severity and worsening symptoms and risk for COPD exacerbation will start antibiotic therapy. Recommend to continue home supportive care and monitor for worsening symptoms. -Follow up if symptoms fail to improve or worsen.  Meds ordered this encounter  Medications   amitriptyline (ELAVIL) 50 MG tablet    Sig: Take 1 tablet (50 mg total) by mouth at bedtime.    Dispense:  90 tablet    Refill:  0   buPROPion (WELLBUTRIN XL) 150 MG 24 hr tablet    Sig: Take 1 tablet (150 mg total) by mouth daily.    Dispense:  90 tablet    Refill:  0   cyclobenzaprine (FLEXERIL) 10 MG tablet    Sig: Take 1 tablet (10 mg total) by mouth 3 (three) times daily as needed for muscle spasms.    Dispense:  30 tablet    Refill:  1   DULoxetine  (CYMBALTA) 20 MG capsule    Sig: Take 2 capsules (40 mg total) by mouth daily.    Dispense:  180 capsule    Refill:  0   omeprazole (PRILOSEC) 20 MG capsule    Sig: Take 1 capsule (20 mg total) by mouth in the morning and at bedtime.    Dispense:  180 capsule    Refill:  0   spironolactone (ALDACTONE) 25 MG tablet    Sig: Take 1 tablet (25 mg total) by mouth daily.    Dispense:  90 tablet    Refill:  0   azithromycin (ZITHROMAX) 250 MG tablet    Sig: Take 2 tablets on day 1, then 1 tablet daily on days 2 through 5    Dispense:  6 tablet    Refill:  0    Order Specific Question:   Supervising Provider    Answer:   Beatrice Lecher D [[id number]]    Follow-up: Return in about 4 months (around 03/25/2021) for HTN, mood, HLD.   Note:  This note was prepared with assistance of Dragon voice recognition software. Occasional wrong-word or sound-a-like substitutions may have occurred due to the inherent limitations of voice recognition software.   Lorrene Reid, PA-C

## 2020-11-23 NOTE — Assessment & Plan Note (Signed)
-  Stable. -Continue current medication regimen. -Recommend stretches.

## 2020-11-23 NOTE — Patient Instructions (Signed)

## 2020-11-23 NOTE — Assessment & Plan Note (Signed)
-  Stable. PHQ-9 score of 0. -Continue current medication regimen.

## 2020-11-23 NOTE — Assessment & Plan Note (Signed)
-  Last TSH wnl -Continue current medication regimen. -Rechecking thyroid labs today. Pending results will make medication adjustments if indicated.  

## 2020-11-23 NOTE — Assessment & Plan Note (Signed)
-  Controlled. Continue current medication regimen. Will continue to monitor. 

## 2020-11-23 NOTE — Assessment & Plan Note (Signed)
Followed by pulmonology 

## 2020-11-23 NOTE — Assessment & Plan Note (Signed)
-  Stable. -Continue muscle relaxer as needed.

## 2020-11-24 ENCOUNTER — Encounter: Payer: Self-pay | Admitting: Physician Assistant

## 2020-11-24 LAB — COMPREHENSIVE METABOLIC PANEL
ALT: 7 IU/L (ref 0–32)
AST: 16 IU/L (ref 0–40)
Albumin/Globulin Ratio: 2.1 (ref 1.2–2.2)
Albumin: 4.7 g/dL (ref 3.8–4.8)
Alkaline Phosphatase: 99 IU/L (ref 44–121)
BUN/Creatinine Ratio: 20 (ref 12–28)
BUN: 15 mg/dL (ref 8–27)
Bilirubin Total: <0.2 mg/dL (ref 0.0–1.2)
CO2: 23 mmol/L (ref 20–29)
Calcium: 9.4 mg/dL (ref 8.7–10.3)
Chloride: 104 mmol/L (ref 96–106)
Creatinine, Ser: 0.75 mg/dL (ref 0.57–1.00)
Globulin, Total: 2.2 g/dL (ref 1.5–4.5)
Glucose: 82 mg/dL (ref 65–99)
Potassium: 4.3 mmol/L (ref 3.5–5.2)
Sodium: 141 mmol/L (ref 134–144)
Total Protein: 6.9 g/dL (ref 6.0–8.5)
eGFR: 89 mL/min/{1.73_m2} (ref >59)

## 2020-11-24 LAB — CBC WITH DIFFERENTIAL/PLATELET
Basophils Absolute: 0.1 10*3/uL (ref 0.0–0.2)
Basos: 1 %
EOS (ABSOLUTE): 0.2 10*3/uL (ref 0.0–0.4)
Eos: 2 %
Hematocrit: 41.1 % (ref 34.0–46.6)
Hemoglobin: 13.8 g/dL (ref 11.1–15.9)
Immature Grans (Abs): 0 10*3/uL (ref 0.0–0.1)
Immature Granulocytes: 0 %
Lymphocytes Absolute: 2.6 10*3/uL (ref 0.7–3.1)
Lymphs: 31 %
MCH: 28.8 pg (ref 26.6–33.0)
MCHC: 33.6 g/dL (ref 31.5–35.7)
MCV: 86 fL (ref 79–97)
Monocytes Absolute: 0.5 10*3/uL (ref 0.1–0.9)
Monocytes: 6 %
Neutrophils Absolute: 5 10*3/uL (ref 1.4–7.0)
Neutrophils: 60 %
Platelets: 306 10*3/uL (ref 150–450)
RBC: 4.79 x10E6/uL (ref 3.77–5.28)
RDW: 12.9 % (ref 11.7–15.4)
WBC: 8.5 10*3/uL (ref 3.4–10.8)

## 2020-11-24 LAB — T4, FREE: Free T4: 1.79 ng/dL — ABNORMAL HIGH (ref 0.82–1.77)

## 2020-11-24 LAB — TSH: TSH: 0.472 u[IU]/mL (ref 0.450–4.500)

## 2020-11-24 NOTE — Telephone Encounter (Signed)
Spoke with patient over the phone to review labs and schedule a lab recheck before making any medication changes.

## 2020-12-01 ENCOUNTER — Other Ambulatory Visit: Payer: Self-pay | Admitting: Physician Assistant

## 2020-12-01 DIAGNOSIS — Z1231 Encounter for screening mammogram for malignant neoplasm of breast: Secondary | ICD-10-CM

## 2020-12-03 ENCOUNTER — Ambulatory Visit: Payer: Medicare Other

## 2020-12-04 NOTE — Progress Notes (Signed)
HPI female former smoker followed for COPD mixed type, hemoptysis, complicated by CAD/ MI T2IZ WNL MM 138 6MWT-06/23/2014-94%, 93%, 99%, 452 m on room air. No oxygen limitation. PFT-06/23/14- moderate obstructive airways disease with insignificant response to bronchodilator, normal lung capacity, diffusion mildly reduced. FVC 2.64/70%, FEV1 1.92/65%, FEV1/FVC 0.72 ONOX-04/30/14- 314 minutes with oxygen saturation less than or equal to 88% on room air, qualifying for home O2 during sleep ECHO 05/16/13 EF 60-65%, GR1 diast dysfunction HST- 07/16/15- AHI 6/ hr, desaturation to 82%, body weight 186 lbs ---------------------------------------------------------------------------------------------------   10/31/19- 65 year old female former smoker followed for COPD mixed type, hemoptysis, complicated by CAD/ MI, RUL nodule, PAH. -----worsening cough, yellow mucus sometimes, shortness of breath for the past month augmentin started 8/19, Ventolin hfa,  Onset 1 month ago of exacerbation URI/ bronchitis. PCP initially gave Zpak, then augmentin/ prednisone taper. Few doses of augmentin left. Tested neg Covid. Reluctant Covax- discussed- although brother-in-law dies of Covid.  Gradually better. No HA or fever now. Tessalon no help cough so PCP gave ?hycodan. Only has Ventolin inhaler.  Asked about PAH suggested on CXR. ECHO in July "mild PAH". She will discuss w cardiology at next appt. She had O2 for sleep in past, dc'd in 20i17 at her request  Will update ONOX. CXR 10/25/19-  IMPRESSION: No acute cardiopulmonary abnormality.  Chronic hyperinflation with some coarsened interstitial changes compatible with history of COPD. Central pulmonary arterial enlargement compatible with pulmonary artery hypertension. Mild chronic scarring in the left mid lung and lung bases. Aortic Atherosclerosis (ICD10-I70.0). Prominent aorta better evaluated on cross-sectional imaging.  12/07/20- 65 year old female former  smoker(50 pkyrs) followed for COPD mixed type/ Cough, hemoptysis, complicated by CAD/ MI, RUL nodule, PAH.Covid infecction Sept 2021, GERD, Aortic Aneurysm, -Neb pulmicort, albuterol hfa, tussionex, (Prone to thrush w steroid inhalers,  She has tried and failed Spiriva, Anoro, Symbicort and Breztri for COPD in the past d/t thrush and palpitation symptoms) Covid vax-2 Phizer Pt states coughing and SOB since covid x1 year  Husband died of COVID in 2021-clot from her.  She was treated with infusion.  She still coughs and asks help for this.  Paroxysmal dry cough.  Some recovery of taste and smell but still some mental fog. Chest CT reviewed with her.  She is to discuss her aortic aneurysm with her primary provider.  CTa chest aorta 08/28/20-  IMPRESSION: 1. Stable aneurysmal disease of the thoracic aorta with mild aneurysmal dilatation of the ascending thoracic aorta measuring up to 4.1 cm. Recommend annual imaging followup by CTA or MRA. This recommendation follows 2010 ACCF/AHA/AATS/ACR/ASA/SCA/SCAI/SIR/STS/SVM Guidelines for the Diagnosis and Management of Patients with Thoracic Aortic Disease. Circulation. 2010; 121: T245-Y099. Aortic aneurysm NOS (ICD10-I71.9) 2. Stable dilatation of central pulmonary arteries consistent with some degree of underlying pulmonary hypertension. 3. Stable patchy enhancement pattern of the liver with some degree of hepatic steatosis suspected. Aortic aneurysm NOS (ICD10-I71.9).  ROS-see HPI      + = positive Constitutional:   No-   weight loss, night sweats, fevers, chills, fatigue, lassitude. HEENT:   + headaches, difficulty swallowing, tooth/dental problems, sore throat,       No-  sneezing, itching, ear ache, +nasal congestion, post nasal drip,  CV:  +chest pain, orthopnea, PND, swelling in lower extremities, anasarca,  dizziness, +palpitations Resp: +shortness of breath with exertion or at rest.                 productive cough,  + non-productive cough,   coughing up of blood.               change in color of mucus.  wheezing.   Skin: No-   rash or lesions. GI:  + heartburn, indigestion, abdominal pain, nausea, vomiting,  GU:  MS:  +  joint pain or swelling.   Neuro-     nothing unusual Psych:  No- change in mood or affect. No depression or anxiety.  No memory loss.  OBJ- Physical Exam General- Alert, Oriented, Affect-appropriate, Distress- none acute Skin- rash-none, lesions- none, excoriation- none Lymphadenopathy- none Head- atraumatic            Eyes- Gross vision intact, PERRLA, conjunctivae and secretions clear            Ears- Hearing, canals-normal            Nose- Clear, no-Septal dev, mucus, polyps, erosion, perforation             Throat- Mallampati II , mucosa clear , drainage- none, tonsils- atrophic,                    + dentures Neck- flexible , trachea midline, no stridor , thyroid nl, carotid no bruit Chest - symmetrical excursion , unlabored           Heart/CV- RRR , no murmur , no gallop  , no rub, nl s1 s2                           - JVD- none , edema- none, stasis changes- none, varices- none           Lung- clear to P&A, wheeze- none, cough+ raspy, dullness-none, rub- none           Chest wall-  Abd-  Br/ Gen/ Rectal- Not done, not indicated Extrem- cyanosis- none, clubbing, none, atrophy- none, strength- nl Neuro- grossly intact to observation

## 2020-12-07 ENCOUNTER — Ambulatory Visit: Payer: Medicare HMO | Admitting: Internal Medicine

## 2020-12-07 ENCOUNTER — Other Ambulatory Visit: Payer: Self-pay

## 2020-12-07 ENCOUNTER — Ambulatory Visit (INDEPENDENT_AMBULATORY_CARE_PROVIDER_SITE_OTHER): Payer: Medicare HMO

## 2020-12-07 ENCOUNTER — Encounter: Payer: Self-pay | Admitting: Internal Medicine

## 2020-12-07 VITALS — BP 132/90 | HR 87 | Temp 98.7°F | Ht 66.5 in | Wt 197.0 lb

## 2020-12-07 DIAGNOSIS — J449 Chronic obstructive pulmonary disease, unspecified: Secondary | ICD-10-CM

## 2020-12-07 DIAGNOSIS — I700 Atherosclerosis of aorta: Secondary | ICD-10-CM | POA: Diagnosis not present

## 2020-12-07 DIAGNOSIS — Z23 Encounter for immunization: Secondary | ICD-10-CM | POA: Diagnosis not present

## 2020-12-07 DIAGNOSIS — J441 Chronic obstructive pulmonary disease with (acute) exacerbation: Secondary | ICD-10-CM | POA: Diagnosis not present

## 2020-12-07 DIAGNOSIS — J984 Other disorders of lung: Secondary | ICD-10-CM | POA: Diagnosis not present

## 2020-12-07 DIAGNOSIS — I7121 Aneurysm of the ascending aorta, without rupture: Secondary | ICD-10-CM

## 2020-12-07 DIAGNOSIS — R053 Chronic cough: Secondary | ICD-10-CM | POA: Diagnosis not present

## 2020-12-07 MED ORDER — HYDROCOD POLST-CPM POLST ER 10-8 MG/5ML PO SUER: 5.0000 mL | Freq: Two times a day (BID) | ORAL | 0 refills | Status: DC | PRN

## 2020-12-07 MED ORDER — TRELEGY ELLIPTA 100-62.5-25 MCG/INH IN AEPB: 1.0000 | INHALATION_SPRAY | Freq: Every day | RESPIRATORY_TRACT | 0 refills | Status: DC

## 2020-12-07 NOTE — Progress Notes (Signed)
Spoke with pt and she voices understanding. Pt did not have any further questions.

## 2020-12-07 NOTE — Patient Instructions (Signed)
Tussionex refill sent  Order- sample x 2 Trelegy 100 inhaler     Inhale 1 puff, then rinse mouth, once daily You can still use your nebulizer or albuterol rescue inhaler as needed  Order- CXR   dx cough post Covid  Order- schedule PFT    dx COPD exacerbation

## 2020-12-18 ENCOUNTER — Ambulatory Visit
Admission: RE | Admit: 2020-12-18 | Discharge: 2020-12-18 | Disposition: A | Discharge status: Home / Self Care | Payer: Medicare HMO | Source: Ambulatory Visit

## 2020-12-18 ENCOUNTER — Other Ambulatory Visit: Payer: Self-pay

## 2020-12-18 DIAGNOSIS — Z1231 Encounter for screening mammogram for malignant neoplasm of breast: Secondary | ICD-10-CM | POA: Diagnosis not present

## 2020-12-21 ENCOUNTER — Other Ambulatory Visit: Payer: Self-pay | Admitting: Physician Assistant

## 2020-12-21 DIAGNOSIS — F4323 Adjustment disorder with mixed anxiety and depressed mood: Secondary | ICD-10-CM

## 2020-12-29 ENCOUNTER — Other Ambulatory Visit: Payer: Self-pay

## 2020-12-29 ENCOUNTER — Encounter: Payer: Self-pay | Admitting: Physician Assistant

## 2020-12-29 ENCOUNTER — Ambulatory Visit (INDEPENDENT_AMBULATORY_CARE_PROVIDER_SITE_OTHER): Payer: Medicare HMO | Admitting: Physician Assistant

## 2020-12-29 VITALS — BP 110/72 | HR 87 | Temp 98.1°F | Wt 195.8 lb

## 2020-12-29 DIAGNOSIS — J0141 Acute recurrent pansinusitis: Secondary | ICD-10-CM | POA: Diagnosis not present

## 2020-12-29 MED ORDER — AMOXICILLIN-POT CLAVULANATE 875-125 MG PO TABS: 1.0000 | ORAL_TABLET | Freq: Two times a day (BID) | ORAL | 0 refills | Status: DC

## 2020-12-29 NOTE — Progress Notes (Signed)
Acute Office Visit  Subjective:    Patient ID: Amy Hodge, female    DOB: [birth year], 65 y.o.   MRN: [id number]  Chief Complaint  Patient presents with   Sore Throat    Sore Throat   Patient is in today for c/o sore throat, headache, sneezing, nasal congestion, chills, right earache and swollen lymph nodes. Symptoms started last Wednesday. States felt feverish but did not take her temperature. Denies n/v/d, worsening shortness of breath or chest pain. Patient has chronic cough. Takes delsym as needed for cough and tussionex if cough does not improve with delsym. Reports nasal sprays do not work.  Past Medical History:  Diagnosis Date   Allergy    Anxiety    COPD (chronic obstructive pulmonary disease) ([place])    Coronary artery disease    a. s/p PCI of RCA in 2015 b. cath in 12/2015 showed 100% Ost RPDA with collaterals present   DDD (degenerative disc disease), cervical    Depression    DJD (degenerative joint disease) of knee    RT   GERD (gastroesophageal reflux disease)    Hashimoto's disease    Headache(784.0)    Hyperlipidemia    Hypertension    Hypothyroidism    Myocardial infarction ([place])    Nocturnal hypoxemia 02/26/2015   On supplemental oxygen therapy    Oxygen 2.5 l/m at bedtime.   Stented coronary artery 02/26/2015    Past Surgical History:  Procedure Laterality Date   ABDOMINAL HYSTERECTOMY     APPENDECTOMY  1971   BUNIONECTOMY Right    CARDIAC CATHETERIZATION N/A 09/02/2014   Procedure: Left Heart Cath and Coronary Angiography;  Surgeon: Adrian Prows, MD;  Location: [facility];  Service: Cardiovascular;  Laterality: N/A;   CARDIAC CATHETERIZATION N/A 12/15/2015   Procedure: Left Heart Cath and Coronary Angiography;  Surgeon: Jettie Booze, MD;  Location: [facility];  Service: Cardiovascular;  Laterality: N/A;   CERVICAL FUSION     CESAREAN SECTION     COLONOSCOPY WITH PROPOFOL N/A 02/24/2015   Procedure: COLONOSCOPY WITH  PROPOFOL;  Surgeon: Garlan Fair, MD;  Location: [facility];  Service: Endoscopy;  Laterality: N/A;   ESOPHAGOGASTRODUODENOSCOPY (EGD) WITH PROPOFOL N/A 02/24/2015   Procedure: ESOPHAGOGASTRODUODENOSCOPY (EGD) WITH PROPOFOL;  Surgeon: Garlan Fair, MD;  Location: [facility];  Service: Endoscopy;  Laterality: N/A;   HAND SURGERY     due to trauma-dog bite.   I & D EXTREMITY Bilateral 05/17/2013   Procedure: IRRIGATION AND DEBRIDEMENT EXTREMITY;  Surgeon: Roseanne Kaufman, MD;  Location: [facility];  Service: Orthopedics;  Laterality: Bilateral;   LEFT HEART CATHETERIZATION WITH CORONARY ANGIOGRAM N/A 03/04/2014   Procedure: LEFT HEART CATHETERIZATION WITH CORONARY ANGIOGRAM;  Surgeon: Laverda Page, MD;  Location: [facility];  Service: Cardiovascular;  Laterality: N/A;   OOPHORECTOMY     SHOULDER SURGERY     RT   [place]   TOTAL ABDOMINAL HYSTERECTOMY W/ BILATERAL SALPINGOOPHORECTOMY  01/2008   with cervical dysplasia   TUBAL LIGATION  1984    Family History  Problem Relation Age of Onset   Lung cancer Mother    Hypertension Mother    Stroke Mother    Diabetes Mother    Diabetes Father    Hypertension Father    Heart attack Father    Asthma Father    COPD Father    Alcohol abuse Father    Diabetes Sister  Hypertension Sister     Social History   Socioeconomic History   Marital status: Widowed    Spouse name: Not on file   Number of children: 1   Years of education: Not on file   Highest education level: Not on file  Occupational History   Occupation: disabled    Employer: [employer]  Tobacco Use   Smoking status: Former    Packs/day: 2.00    Years: 25.00    Pack years: 50.00    Types: Cigarettes    Quit date: 04/23/1996    Years since quitting: 24.7   Smokeless tobacco: Never  Vaping Use   Vaping Use: Never used  Substance and Sexual Activity   Alcohol use: No    Alcohol/week: 0.0 standard drinks   Drug use: No    Sexual activity: Not Currently  Other Topics Concern   Not on file  Social History Narrative   On worker's comp for lower back and right shoulder - applying for disability; losing [employer] insurance 03/07/2010   Social Determinants of Health   Financial Resource Strain: Not on file  Food Insecurity: Not on file  Transportation Needs: Not on file  Physical Activity: Not on file  Stress: Not on file  Social Connections: Not on file  Intimate Partner Violence: Not on file    Outpatient Medications Prior to Visit  Medication Sig Dispense Refill   acetaminophen (TYLENOL) 500 MG tablet Take 1,000 mg by mouth every 8 (eight) hours as needed (for pain or headaches).     albuterol (VENTOLIN HFA) 108 (90 Base) MCG/ACT inhaler INHALE 1-2 PUFFS INTO THE LUNGS EVERY 6 (SIX) HOURS AS NEEDED FOR WHEEZING OR SHORTNESS OF BREATH. 1 each 1   amitriptyline (ELAVIL) 50 MG tablet Take 1 tablet (50 mg total) by mouth at bedtime. 90 tablet 0   amLODipine (NORVASC) 5 MG tablet Take 1 tablet (5 mg total) by mouth daily. 90 tablet 3   aspirin EC 81 MG tablet Take 81 mg by mouth daily at 6 PM.      B Complex Vitamins (VITAMIN B COMPLEX PO) Take 1 tablet by mouth daily.     buPROPion (WELLBUTRIN XL) 150 MG 24 hr tablet TAKE 1 TABLET EVERY DAY 90 tablet 0   chlorpheniramine-HYDROcodone (TUSSIONEX PENNKINETIC ER) 10-8 MG/5ML SUER Take 5 mLs by mouth every 12 (twelve) hours as needed for cough. 140 mL 0   Cholecalciferol (VITAMIN D3) 5000 units CAPS Take 5,000 Units by mouth daily.      cyclobenzaprine (FLEXERIL) 10 MG tablet Take 1 tablet (10 mg total) by mouth 3 (three) times daily as needed for muscle spasms. 30 tablet 1   DULoxetine (CYMBALTA) 20 MG capsule Take 2 capsules (40 mg total) by mouth daily. 180 capsule 0   fexofenadine (ALLEGRA) 180 MG tablet Take 1 tablet (180 mg total) by mouth daily. 90 tablet 3   hydrOXYzine (ATARAX/VISTARIL) 25 MG tablet 1-2 tabs by mouth every 6-8 hrs PRN anxiety 60 tablet 0    ipratropium (ATROVENT) 0.02 % nebulizer solution Take 2.5 mLs (0.5 mg total) by nebulization 4 (four) times daily. 75 mL 12   levothyroxine (SYNTHROID) 175 MCG tablet Take 1 tablet (175 mcg total) by mouth daily before breakfast. 90 tablet 0   meloxicam (MOBIC) 15 MG tablet TAKE 1 TABLET EVERY DAY 60 tablet 0   Multiple Vitamin (MULTIVITAMIN) tablet Take 1 tablet by mouth daily.     nitroGLYCERIN (NITROSTAT) 0.4 MG SL tablet Place 0.4 mg under the  tongue every 5 (five) minutes as needed for chest pain.     omeprazole (PRILOSEC) 20 MG capsule Take 1 capsule (20 mg total) by mouth in the morning and at bedtime. 180 capsule 0   spironolactone (ALDACTONE) 25 MG tablet Take 1 tablet (25 mg total) by mouth daily. 90 tablet 0   budesonide (PULMICORT) 0.25 MG/2ML nebulizer solution Take 2 mLs (0.25 mg total) by nebulization in the morning and at bedtime. (Patient not taking: No sig reported) 60 mL 3   ezetimibe (ZETIA) 10 MG tablet Take 1 tablet (10 mg total) by mouth daily. (Patient not taking: No sig reported) 30 tablet 3   Fluticasone-Umeclidin-Vilant (TRELEGY ELLIPTA) 100-62.5-25 MCG/INH AEPB Inhale 1 puff into the lungs daily. (Patient not taking: Reported on 12/29/2020) 60 each 0   No facility-administered medications prior to visit.    Allergies  Allergen Reactions   Bactrim [Sulfamethoxazole-Trimethoprim] Shortness Of Breath, Anxiety and Palpitations   Ketorolac Shortness Of Breath   Anoro Ellipta [Umeclidinium-Vilanterol] Other (See Comments)    Per patient, caused painful blisters in her mouth.    Morphine Other (See Comments)    Headache    Sulfamethoxazole    Tetracyclines & Related Itching   Trimethoprim    Valsartan Other (See Comments) and Cough    Headaches also    Bystolic [Nebivolol Hcl] Other (See Comments)    Fatigue- Tired feeling   Isosorbide Other (See Comments)    HEADACHES    Levofloxacin Hives   Lisinopril Cough   Metoprolol Other (See Comments)    FATIGUE    Morphine And Related Other (See Comments)    Headache    Simvastatin Other (See Comments)    Makes legs hurt/ sore    Singulair [Montelukast Sodium] Palpitations and Cough   Statins Other (See Comments)    MYALGIAS   Tetracycline Hives and Itching   Toradol [Ketorolac Tromethamine] Palpitations    Review of Systems Review of Systems:  A fourteen system review of systems was performed and found to be positive as per HPI.  Objective:    Physical Exam General:  Well Developed, well nourished, in no acute distress Neuro:  Alert and oriented,  extra-ocular muscles intact  HEENT:  Normocephalic, atraumatic, PERRL, sinus tenderness of frontal and maxillary, some fluid behind right TM, normal TM of left ear, slight boggy turbinates, neck supple, +cervical adenopathy (submandibular) Skin:  no gross rash, warm, pink. Cardiac:  RRR Respiratory:  slight dec breath sounds at lung bases, no wheezing, crackles or rales. Not using accessory muscles, no respiratory distress. Vascular:  Ext warm, no cyanosis apprec.; cap RF less 2 sec. Psych:  No HI/SI, judgement and insight good, Euthymic mood. Full Affect.  BP 110/72   Pulse 87   Temp 98.1 F (36.7 C)   Wt 195 lb 12.8 oz (88.8 kg)   SpO2 98%   BMI 31.13 kg/m  Wt Readings from Last 3 Encounters:  12/29/20 195 lb 12.8 oz (88.8 kg)  12/07/20 197 lb (89.4 kg)  11/23/20 199 lb (90.3 kg)    Health Maintenance Due  Topic Date Due   Zoster Vaccines- Shingrix (1 of 2) Never done   COLONOSCOPY (Pts 45-69yr Insurance coverage will need to be confirmed)  02/24/2020   COVID-19 Vaccine (3 - Pfizer risk series) 05/04/2020    There are no preventive care reminders to display for this patient.   Lab Results  Component Value Date   TSH 0.472 11/23/2020   Lab Results  Component Value  Date   WBC 8.5 11/23/2020   HGB 13.8 11/23/2020   HCT 41.1 11/23/2020   MCV 86 11/23/2020   PLT 306 11/23/2020   Lab Results  Component Value Date   NA  141 11/23/2020   K 4.3 11/23/2020   CO2 23 11/23/2020   GLUCOSE 82 11/23/2020   BUN 15 11/23/2020   CREATININE 0.75 11/23/2020   BILITOT <0.2 11/23/2020   ALKPHOS 99 11/23/2020   AST 16 11/23/2020   ALT 7 11/23/2020   PROT 6.9 11/23/2020   ALBUMIN 4.7 11/23/2020   CALCIUM 9.4 11/23/2020   ANIONGAP 10 08/23/2019   EGFR 89 11/23/2020   GFR 66.63 11/05/2015   Lab Results  Component Value Date   CHOL 192 07/17/2020   Lab Results  Component Value Date   HDL 54 07/17/2020   Lab Results  Component Value Date   LDLCALC 114 (H) 07/17/2020   Lab Results  Component Value Date   TRIG 134 07/17/2020   Lab Results  Component Value Date   CHOLHDL 3.6 07/17/2020   Lab Results  Component Value Date   HGBA1C 5.4 07/17/2020       Assessment & Plan:   Problem List Items Addressed This Visit   None Visit Diagnoses     Acute recurrent pansinusitis    -  Primary   Relevant Medications   amoxicillin-clavulanate (AUGMENTIN) 875-125 MG tablet      Patient has s/s suggestive of pansinusitis and symptoms ongoing >7 days with minimal improvement with supportive care so will start oral antibiotic therapy with Augmentin. Advised to take as directed. Continue home supportive care and recommend humidifier, warm showers and cough/sore throat lozenges.   Meds ordered this encounter  Medications   amoxicillin-clavulanate (AUGMENTIN) 875-125 MG tablet    Sig: Take 1 tablet by mouth 2 (two) times daily.    Dispense:  20 tablet    Refill:  0    Order Specific Question:   Supervising Provider    Answer:   Beatrice Lecher D [[id number]]     Lorrene Reid, PA-C

## 2020-12-29 NOTE — Patient Instructions (Signed)

## 2021-01-04 ENCOUNTER — Other Ambulatory Visit: Payer: Self-pay

## 2021-01-04 DIAGNOSIS — I10 Essential (primary) hypertension: Secondary | ICD-10-CM

## 2021-01-04 DIAGNOSIS — E785 Hyperlipidemia, unspecified: Secondary | ICD-10-CM

## 2021-01-04 DIAGNOSIS — E7800 Pure hypercholesterolemia, unspecified: Secondary | ICD-10-CM

## 2021-01-04 DIAGNOSIS — E890 Postprocedural hypothyroidism: Secondary | ICD-10-CM

## 2021-01-04 DIAGNOSIS — Z0000 Encounter for general adult medical examination without abnormal findings: Secondary | ICD-10-CM

## 2021-01-04 DIAGNOSIS — R5382 Chronic fatigue, unspecified: Secondary | ICD-10-CM

## 2021-01-05 ENCOUNTER — Other Ambulatory Visit: Payer: Self-pay

## 2021-01-05 ENCOUNTER — Other Ambulatory Visit: Payer: Medicare HMO

## 2021-01-05 DIAGNOSIS — Z0000 Encounter for general adult medical examination without abnormal findings: Secondary | ICD-10-CM

## 2021-01-05 DIAGNOSIS — R5382 Chronic fatigue, unspecified: Secondary | ICD-10-CM

## 2021-01-05 DIAGNOSIS — I10 Essential (primary) hypertension: Secondary | ICD-10-CM | POA: Diagnosis not present

## 2021-01-05 DIAGNOSIS — E890 Postprocedural hypothyroidism: Secondary | ICD-10-CM | POA: Diagnosis not present

## 2021-01-05 DIAGNOSIS — E7800 Pure hypercholesterolemia, unspecified: Secondary | ICD-10-CM

## 2021-01-05 DIAGNOSIS — E785 Hyperlipidemia, unspecified: Secondary | ICD-10-CM

## 2021-01-06 ENCOUNTER — Other Ambulatory Visit: Payer: Self-pay | Admitting: Physician Assistant

## 2021-01-06 DIAGNOSIS — M791 Myalgia, unspecified site: Secondary | ICD-10-CM

## 2021-01-06 LAB — T4, FREE: Free T4: 1.71 ng/dL (ref 0.82–1.77)

## 2021-01-06 LAB — TSH: TSH: 1.7 u[IU]/mL (ref 0.450–4.500)

## 2021-01-06 LAB — T3: T3, Total: 132 ng/dL (ref 71–180)

## 2021-01-15 ENCOUNTER — Other Ambulatory Visit: Payer: Self-pay | Admitting: Physician Assistant

## 2021-01-15 DIAGNOSIS — G8929 Other chronic pain: Secondary | ICD-10-CM

## 2021-01-15 DIAGNOSIS — M5440 Lumbago with sciatica, unspecified side: Secondary | ICD-10-CM

## 2021-02-04 ENCOUNTER — Other Ambulatory Visit: Payer: Self-pay | Admitting: Physician Assistant

## 2021-02-04 DIAGNOSIS — E890 Postprocedural hypothyroidism: Secondary | ICD-10-CM

## 2021-02-08 ENCOUNTER — Ambulatory Visit (INDEPENDENT_AMBULATORY_CARE_PROVIDER_SITE_OTHER): Payer: Medicare HMO | Admitting: Internal Medicine

## 2021-02-08 ENCOUNTER — Other Ambulatory Visit: Payer: Self-pay

## 2021-02-08 DIAGNOSIS — J441 Chronic obstructive pulmonary disease with (acute) exacerbation: Secondary | ICD-10-CM

## 2021-02-08 LAB — PULMONARY FUNCTION TEST
DL/VA % pred: 92 %
DL/VA: 3.77 ml/min/mmHg/L
DLCO cor % pred: 80 %
DLCO cor: 17.79 ml/min/mmHg
DLCO unc % pred: 80 %
DLCO unc: 17.79 ml/min/mmHg
FEF 25-75 Post: 1.49 L/sec
FEF 25-75 Pre: 1.49 L/sec
FEF2575-%Change-Post: 0 %
FEF2575-%Pred-Post: 62 %
FEF2575-%Pred-Pre: 62 %
FEV1-%Change-Post: 1 %
FEV1-%Pred-Post: 71 %
FEV1-%Pred-Pre: 70 %
FEV1-Post: 1.98 L
FEV1-Pre: 1.96 L
FEV1FVC-%Change-Post: 0 %
FEV1FVC-%Pred-Pre: 95 %
FEV6-%Change-Post: 3 %
FEV6-%Pred-Post: 77 %
FEV6-%Pred-Pre: 75 %
FEV6-Post: 2.69 L
FEV6-Pre: 2.6 L
FEV6FVC-%Change-Post: 1 %
FEV6FVC-%Pred-Post: 103 %
FEV6FVC-%Pred-Pre: 101 %
FVC-%Change-Post: 2 %
FVC-%Pred-Post: 75 %
FVC-%Pred-Pre: 73 %
FVC-Post: 2.71 L
FVC-Pre: 2.65 L
Post FEV1/FVC ratio: 73 %
Post FEV6/FVC ratio: 99 %
Pre FEV1/FVC ratio: 74 %
Pre FEV6/FVC Ratio: 98 %
RV % pred: 127 %
RV: 2.8 L
TLC % pred: 101 %
TLC: 5.57 L

## 2021-02-08 NOTE — Progress Notes (Signed)
PFT done today. 

## 2021-02-10 ENCOUNTER — Encounter: Payer: Self-pay | Admitting: Internal Medicine

## 2021-02-11 NOTE — Telephone Encounter (Signed)
Message received for Dr. Annamaria Boots about PFT results.  PFT was 02/08/21.    Exactly what does all these graphs and numbers mean?  Message routed to Dr. Annamaria Boots to advise

## 2021-02-12 NOTE — Telephone Encounter (Signed)
PFT result- the size of the lungs and ability to exchange oxygen and carbon dioxide are normal. There is mild slowing of airflow through the airways- what we refer to as obstructive airways disease. This did not change very much with the inhaler that was used during the testing. We will go over this again in the future. For now, continue using current meds.

## 2021-02-14 ENCOUNTER — Other Ambulatory Visit: Payer: Self-pay | Admitting: Cardiology

## 2021-02-15 ENCOUNTER — Other Ambulatory Visit: Payer: Self-pay | Admitting: Physician Assistant

## 2021-02-15 DIAGNOSIS — K219 Gastro-esophageal reflux disease without esophagitis: Secondary | ICD-10-CM

## 2021-02-15 DIAGNOSIS — F4323 Adjustment disorder with mixed anxiety and depressed mood: Secondary | ICD-10-CM

## 2021-02-15 DIAGNOSIS — F320 Major depressive disorder, single episode, mild: Secondary | ICD-10-CM

## 2021-02-15 DIAGNOSIS — I10 Essential (primary) hypertension: Secondary | ICD-10-CM

## 2021-02-15 DIAGNOSIS — G4700 Insomnia, unspecified: Secondary | ICD-10-CM

## 2021-03-21 ENCOUNTER — Encounter: Payer: Self-pay | Admitting: Internal Medicine

## 2021-03-21 DIAGNOSIS — I719 Aortic aneurysm of unspecified site, without rupture: Secondary | ICD-10-CM | POA: Insufficient documentation

## 2021-03-21 NOTE — Assessment & Plan Note (Signed)
Continue CT follow-up per cardiology/primary care

## 2021-03-21 NOTE — Assessment & Plan Note (Signed)
Persistent cough/bronchitis since COVID infection 2021. Plan-CXR, refill Tussionex with discussion, flu vaccine

## 2021-03-23 ENCOUNTER — Encounter: Payer: Self-pay | Admitting: Physician Assistant

## 2021-03-23 ENCOUNTER — Ambulatory Visit (INDEPENDENT_AMBULATORY_CARE_PROVIDER_SITE_OTHER): Payer: Medicare HMO | Admitting: Physician Assistant

## 2021-03-23 ENCOUNTER — Other Ambulatory Visit: Payer: Self-pay

## 2021-03-23 VITALS — BP 111/75 | HR 80 | Temp 97.8°F | Ht 67.0 in | Wt 197.0 lb

## 2021-03-23 DIAGNOSIS — E890 Postprocedural hypothyroidism: Secondary | ICD-10-CM | POA: Diagnosis not present

## 2021-03-23 DIAGNOSIS — I10 Essential (primary) hypertension: Secondary | ICD-10-CM

## 2021-03-23 DIAGNOSIS — I712 Thoracic aortic aneurysm, without rupture, unspecified: Secondary | ICD-10-CM

## 2021-03-23 DIAGNOSIS — F4323 Adjustment disorder with mixed anxiety and depressed mood: Secondary | ICD-10-CM | POA: Diagnosis not present

## 2021-03-23 DIAGNOSIS — E7800 Pure hypercholesterolemia, unspecified: Secondary | ICD-10-CM

## 2021-03-23 NOTE — Patient Instructions (Signed)
Physical Activity With Heart Disease °Being active has many benefits, especially if you have heart disease. Physical activity can help you do more and feel healthier. Start slowly, and increase the amount of time you spend being active. You should aim for physical activity that: °Makes you breathe harder and raises your heart rate (aerobic activity). Try to get at least 150 minutes of aerobic activity each week. This is about 30 minutes each day, 5 days a week. °Helps build muscle strength (strengthening activity). Do this at least 2 times a week. °A good rule of thumb is to work hard enough to breathe harder but still be able to carry on a conversation. If you can sing, you may not be working hard enough. °You may also want to monitor your heart rate (pulse) and blood pressure. Ask your health care provider what kind of tools you will need to track these. °Always talk with your health care provider before starting any new activity program or if you have any changes in your condition. °What are the benefits of physical activity? °Physical activity can help improve your heart and blood vessel (cardiovascular) health. It can: °Lower your blood pressure. °Lower your cholesterol. °Control your weight. °Help control your blood sugar. °Improve the function of your heart and lungs. °Reduce your risk of developing blood clots. °Physical activity can help improve other aspects of your health. It can: °Prevent bone loss. °Improve your sleep. °Improve your energy level. °Reduce stress. °What are some types of physical activity I could try? °There are many ways to be active. Talk with your health care provider about what types and intensity of activity is right for you. °Aerobic activity °Aerobic (cardiovascular) activity can be moderate or vigorous intensity, depending on how hard you are working. °Moderate-intensity activity includes: °Walking. °Slow bicycling. °Water aerobics. °Dancing. °Light gardening or house  work. °Vigorous-intensity activity includes: °Jogging or running. °Stair climbing. °Swimming laps. °Hiking uphill. °Heavy gardening, such as digging trenches. ° °Strengthening activity °Strengthening activities work your muscles to build strength. Some examples include: °Doing push-ups, sit-ups, or pull-ups. °Lifting small weights. °Using resistance bands. °Yoga. ° °Flexibility °Flexibility activities lengthen your muscles to keep them flexible and less tight and improve your balance. Some examples include: °Stretching. °Yoga. °Tai chi. °Ballet barre. ° °Follow these instructions at home: °How to get started °Talk with your health care provider about: °What types of activities are safe for you. °If you should check your pulse or take other precautions during physical activity. °Get a calendar. Write down a schedule and plan for your new routine. °At the start of your workout, as well as at the end, remember to warm up and cool down to allow a gradual increase or decrease in heart rate and breathing. °If you have not been active, begin with sessions that last 10-15 minutes. Gradually work up to sessions that last 20-30 minutes, 5 times a week. Follow all of your health care provider's recommendations. °Take time to find out what works for you. Consider the following: °Join a community program, such as a biking group, yoga class, local gym, or swimming pool membership. °Be active on your own by downloading free workout applications on a smartphone or other devices, or by purchasing workout DVDs. °Be patient with yourself. It takes time to build up strength and lung capacity. °Safety °Exercise in an indoor, climate-controlled facility, as told by your health care provider. You may need to do this if: °There are extreme outdoor conditions, such as heat, humidity, or   cold. °There is an air pollution advisory. Your local news, board of health, or hospital can provide information on air quality. °Take extra precautions as  told by your health care provider. This may include: °Monitoring your heart rate. °Avoiding heavy lifting. °Understanding how your medicines can affect you during physical activity. Certain medicines may cause heat intolerance or changes in blood sugar. °Slowing down to rest when you need to. °Keeping nitroglycerin spray and tablets with you at all times if you have angina. Use them as told to prevent and treat symptoms. °Drink plenty of water before, during, and after physical activity. °Know what symptoms may be signs of a problem and stop physical activity right away if you have any of those symptoms. °Where to find more information °American Heart Association: www.heart.org °[place] Department of Health and Human Services: www.health.gov °Get help right away if you have any of the following during exercise: °Chest pain, shortness of breath, or feel very tired. °Pain in the arm, shoulder, neck, or jaw. °You feel weak, dizzy, or light-headed. °An irregular heart rate, or your heart rate is greater than 100 beats per minute (bpm) before exercise. °These symptoms may represent a serious problem that is an emergency. Do not wait to see if the symptoms go away. Get medical help right away. Call your local emergency services (911 in the [place] Do not drive yourself to the hospital.  °Summary °Physical activity has many benefits, especially if you have heart disease. °Before starting an activity program, talk with your health care provider about how often to be active and what type of activity is safe for you. °Your physical activity plan may include moderate or vigorous aerobic activity, strengthening activities, and flexibility. °Know what symptoms may be signs of a problem. Stop physical activity right away and call emergency services (911 in the [place] if you have any of those symptoms. °This information is not intended to replace advice given to you by your health care provider. Make sure you discuss any questions you  have with your health care provider. °Document Revised: 08/04/2020 Document Reviewed: 08/04/2020 °Elsevier Patient Education © 2022 [employer] ° °

## 2021-03-23 NOTE — Assessment & Plan Note (Signed)
-  Last TSH wnl -Continue current medication regimen. -Rechecking thyroid labs today. Pending results will make medication adjustments if indicated.  

## 2021-03-23 NOTE — Progress Notes (Signed)
Established Patient Office Visit  Subjective:  Patient ID: Amy Hodge, female    DOB: December 22, 1955  Age: 66 y.o. MRN: [id number]  CC:  Chief Complaint  Patient presents with   Follow-up    Mood   Hypertension   Hyperlipidemia    HPI Amy Hodge presents for follow up on hypertension, hyperlipidemia and mood.  HTN: Pt denies chest pain, palpitations, dizziness or syncope. Reports sometimes has lower extremity swelling if she has been on her feet for an extended period of time. Taking medication as directed without side effects.  HLD: Pt has history of statin intolerance and did not tolerate Zetia. Reports has been working on eating better. Plans on going back to the gym.  Mood: Patient reports her mood has been stable. Unfortunately, her nephew passed away over the Holidays which has been difficult and has provided support to her sister. States did have to take hydroxyzine during that period of time but none recently. Taking medications as directed.  Past Medical History:  Diagnosis Date   Allergy    Anxiety    COPD (chronic obstructive pulmonary disease) ([place])    Coronary artery disease    a. s/p PCI of RCA in 2015 b. cath in 12/2015 showed 100% Ost RPDA with collaterals present   DDD (degenerative disc disease), cervical    Depression    DJD (degenerative joint disease) of knee    RT   GERD (gastroesophageal reflux disease)    Hashimoto's disease    Headache(784.0)    Hyperlipidemia    Hypertension    Hypothyroidism    Myocardial infarction ([place])    Nocturnal hypoxemia 02/26/2015   On supplemental oxygen therapy    Oxygen 2.5 l/m at bedtime.   Stented coronary artery 02/26/2015    Past Surgical History:  Procedure Laterality Date   ABDOMINAL HYSTERECTOMY     APPENDECTOMY  1971   BUNIONECTOMY Right    CARDIAC CATHETERIZATION N/A 09/02/2014   Procedure: Left Heart Cath and Coronary Angiography;  Surgeon: Adrian Prows, MD;  Location: [place] [facility];   Service: Cardiovascular;  Laterality: N/A;   CARDIAC CATHETERIZATION N/A 12/15/2015   Procedure: Left Heart Cath and Coronary Angiography;  Surgeon: Jettie Booze, MD;  Location: [place] [facility];  Service: Cardiovascular;  Laterality: N/A;   CERVICAL FUSION     CESAREAN SECTION     COLONOSCOPY WITH PROPOFOL N/A 02/24/2015   Procedure: COLONOSCOPY WITH PROPOFOL;  Surgeon: Garlan Fair, MD;  [facility];  Service: Endoscopy;  Laterality: N/A;   ESOPHAGOGASTRODUODENOSCOPY (EGD) WITH PROPOFOL N/A 02/24/2015   Procedure: ESOPHAGOGASTRODUODENOSCOPY (EGD) WITH PROPOFOL;  Surgeon: Garlan Fair, MD;  [facility];  Service: Endoscopy;  Laterality: N/A;   HAND SURGERY     due to trauma-dog bite.   I & D EXTREMITY Bilateral 05/17/2013   Procedure: IRRIGATION AND DEBRIDEMENT EXTREMITY;  Surgeon: Roseanne Kaufman, MD;  Location: [place];  Service: Orthopedics;  Laterality: Bilateral;   LEFT HEART CATHETERIZATION WITH CORONARY ANGIOGRAM N/A 03/04/2014   Procedure: LEFT HEART CATHETERIZATION WITH CORONARY ANGIOGRAM;  Surgeon: Laverda Page, MD;  Location: [facility] CATH LAB;  Service: Cardiovascular;  Laterality: N/A;   OOPHORECTOMY     SHOULDER SURGERY     RT   [place]   TOTAL ABDOMINAL HYSTERECTOMY W/ BILATERAL SALPINGOOPHORECTOMY  01/2008   with cervical dysplasia   TUBAL LIGATION  1984    Family History  Problem Relation  Age of Onset   Lung cancer Mother    Hypertension Mother    Stroke Mother    Diabetes Mother    Diabetes Father    Hypertension Father    Heart attack Father    Asthma Father    COPD Father    Alcohol abuse Father    Diabetes Sister    Hypertension Sister     Social History   Socioeconomic History   Marital status: Widowed    Spouse name: Not on file   Number of children: 1   Years of education: Not on file   Highest education level: Not on file  Occupational History   Occupation: disabled     Employer: [employer]  Tobacco Use   Smoking status: Former    Packs/day: 2.00    Years: 25.00    Pack years: 50.00    Types: Cigarettes    Quit date: 04/23/1996    Years since quitting: 24.9   Smokeless tobacco: Never  Vaping Use   Vaping Use: Never used  Substance and Sexual Activity   Alcohol use: No    Alcohol/week: 0.0 standard drinks   Drug use: No   Sexual activity: Not Currently  Other Topics Concern   Not on file  Social History Narrative   On worker's comp for lower back and right shoulder - applying for disability; 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     albuterol (VENTOLIN HFA) 108 (90 Base) MCG/ACT inhaler INHALE 1-2 PUFFS INTO THE LUNGS EVERY 6 (SIX) HOURS AS NEEDED FOR WHEEZING OR SHORTNESS OF BREATH. 1 each 1   amitriptyline (ELAVIL) 50 MG tablet TAKE 1 TABLET AT BEDTIME 90 tablet 1   aspirin EC 81 MG tablet Take 81 mg by mouth daily at 6 PM.      B Complex Vitamins (VITAMIN B COMPLEX PO) Take 1 tablet by mouth daily.     budesonide (PULMICORT) 0.25 MG/2ML nebulizer solution Take 2 mLs (0.25 mg total) by nebulization in the morning and at bedtime. (Patient not taking: No sig reported) 60 mL 3   buPROPion (WELLBUTRIN XL) 150 MG 24 hr tablet TAKE 1 TABLET EVERY DAY 90 tablet 0   chlorpheniramine-HYDROcodone (TUSSIONEX PENNKINETIC ER) 10-8 MG/5ML SUER Take 5 mLs by mouth every 12 (twelve) hours as needed for cough. 140 mL 0   Cholecalciferol  (VITAMIN D3) 5000 units CAPS Take 5,000 Units by mouth daily.      cyclobenzaprine (FLEXERIL) 10 MG tablet Take 1 tablet (10 mg total) by mouth 3 (three) times daily as needed for muscle spasms. 30 tablet 1   DULoxetine (CYMBALTA) 20 MG capsule TAKE 2 CAPSULES EVERY DAY 180 capsule 0   fexofenadine (ALLEGRA) 180 MG tablet Take 1 tablet (180 mg total) by mouth daily. 90 tablet 3   hydrOXYzine (ATARAX/VISTARIL) 25 MG tablet 1-2 tabs by mouth every 6-8 hrs PRN anxiety 60 tablet 0   levothyroxine (SYNTHROID) 175  MCG tablet TAKE 1 TABLET EVERY DAY BEFORE BREAKFAST 90 tablet 0   Multiple Vitamin (MULTIVITAMIN) tablet Take 1 tablet by mouth daily.     nitroGLYCERIN (NITROSTAT) 0.4 MG SL tablet Place 0.4 mg under the tongue every 5 (five) minutes as needed for chest pain.     omeprazole (PRILOSEC) 20 MG capsule TAKE 1 CAPSULE (20 MG TOTAL) BY MOUTH IN THE MORNING AND AT BEDTIME. 180 capsule 1   spironolactone (ALDACTONE) 25 MG tablet TAKE 1 TABLET (25 MG TOTAL) BY MOUTH DAILY. 90 tablet 1   amoxicillin-clavulanate (AUGMENTIN) 875-125 MG tablet Take 1 tablet by mouth 2 (two) times daily. 20 tablet 0   ezetimibe (ZETIA) 10 MG tablet Take 1 tablet (10 mg total) by mouth daily. (Patient not taking: No sig reported) 30 tablet 3   Fluticasone-Umeclidin-Vilant (TRELEGY ELLIPTA) 100-62.5-25 MCG/INH AEPB Inhale 1 puff into the lungs daily. (Patient not taking: Reported on 12/29/2020) 60 each 0   ipratropium (ATROVENT) 0.02 % nebulizer solution Take 2.5 mLs (0.5 mg total) by nebulization 4 (four) times daily. 75 mL 12   meloxicam (MOBIC) 15 MG tablet TAKE 1 TABLET EVERY DAY 90 tablet 0   No facility-administered medications prior to visit.    Allergies  Allergen Reactions   Bactrim [Sulfamethoxazole-Trimethoprim] Shortness Of Breath, Anxiety and Palpitations   Ketorolac Shortness Of Breath   Anoro Ellipta [Umeclidinium-Vilanterol] Other (See Comments)    Per patient, caused painful blisters in her mouth.     Morphine Other (See Comments)    Headache    Sulfamethoxazole    Tetracyclines & Related Itching   Trimethoprim    Valsartan Other (See Comments) and Cough    Headaches also    Bystolic [Nebivolol Hcl] Other (See Comments)    Fatigue- Tired feeling   Isosorbide Other (See Comments)    HEADACHES    Levofloxacin Hives   Lisinopril Cough   Metoprolol Other (See Comments)    FATIGUE   Morphine And Related Other (See Comments)    Headache    Simvastatin Other (See Comments)    Makes legs hurt/ sore    Singulair [Montelukast Sodium] Palpitations and Cough   Statins Other (See Comments)    MYALGIAS   Tetracycline Hives and Itching   Toradol [Ketorolac Tromethamine] Palpitations    ROS Review of Systems Review of Systems:  A fourteen system review of systems was performed and found to be positive as per HPI.   Objective:    Physical Exam General:  Well Developed, well nourished, appropriate for stated age.  Neuro:  Alert and oriented,  extra-ocular muscles intact  HEENT:  Normocephalic, atraumatic, neck supple Skin:  no gross rash, warm, pink. Cardiac:  RRR, S1 S2, no murmur  Respiratory: CTA B/L, Not using accessory muscles, speaking in full sentences- unlabored. Vascular:  Ext warm, no cyanosis apprec.; cap RF less 2 sec. Psych:  No HI/SI, judgement and insight good, Euthymic mood. Full Affect.  BP 111/75    Pulse 80    Temp 97.8 F (36.6 C)    Ht $R'5\' 7"'nr$  (1.702 m)    Wt 197 lb (89.4 kg)    SpO2 92%    BMI 30.85 kg/m  Wt Readings from Last 3 Encounters:  03/23/21 197 lb (89.4 kg)  12/29/20 195 lb 12.8 oz (88.8 kg)  12/07/20 197 lb (89.4 kg)     Health Maintenance Due  Topic Date Due   Zoster Vaccines- Shingrix (1 of 2) Never done   COLONOSCOPY (Pts 45-55yrs  Insurance coverage will need to be confirmed)  02/24/2020   COVID-19 Vaccine (3 - Pfizer risk series) 05/04/2020    There are no preventive care reminders to display for this patient.  Lab Results   Component Value Date   TSH 1.700 01/05/2021   Lab Results  Component Value Date   WBC 8.5 11/23/2020   HGB 13.8 11/23/2020   HCT 41.1 11/23/2020   MCV 86 11/23/2020   PLT 306 11/23/2020   Lab Results  Component Value Date   NA 141 11/23/2020   K 4.3 11/23/2020   CO2 23 11/23/2020   GLUCOSE 82 11/23/2020   BUN 15 11/23/2020   CREATININE 0.75 11/23/2020   BILITOT <0.2 11/23/2020   ALKPHOS 99 11/23/2020   AST 16 11/23/2020   ALT 7 11/23/2020   PROT 6.9 11/23/2020   ALBUMIN 4.7 11/23/2020   CALCIUM 9.4 11/23/2020   ANIONGAP 10 08/23/2019   EGFR 89 11/23/2020   GFR 66.63 11/05/2015   Lab Results  Component Value Date   CHOL 192 07/17/2020   Lab Results  Component Value Date   HDL 54 07/17/2020   Lab Results  Component Value Date   LDLCALC 114 (H) 07/17/2020   Lab Results  Component Value Date   TRIG 134 07/17/2020   Lab Results  Component Value Date   CHOLHDL 3.6 07/17/2020   Lab Results  Component Value Date   HGBA1C 5.4 07/17/2020   Depression screen PHQ 2/9 03/23/2021 12/29/2020 11/23/2020 07/21/2020 03/23/2020  Decreased Interest 0 0 0 0 0  Down, Depressed, Hopeless 0 0 0 0 0  PHQ - 2 Score 0 0 0 0 0  Altered sleeping 0 0 0 0 0  Tired, decreased energy 0 0 0 0 0  Change in appetite 0 0 0 0 3  Feeling bad or failure about yourself  0 0 0 0 0  Trouble concentrating 0 0 0 0 0  Moving slowly or fidgety/restless 0 0 0 0 0  Suicidal thoughts 0 0 0 0 0  PHQ-9 Score 0 0 0 0 3  Difficult doing work/chores Not difficult at all - - - Not difficult at all  Some recent data might be hidden   GAD 7 : Generalized Anxiety Score 03/23/2021 12/29/2020 11/23/2020 10/24/2019  Nervous, Anxious, on Edge 0 0 0 0  Control/stop worrying 0 0 0 0  Worry too much - different things 0 0 0 0  Trouble relaxing 0 0 0 0  Restless 0 0 0 0  Easily annoyed or irritable 0 0 0 0  Afraid - awful might happen 0 0 0 0  Total GAD 7 Score 0 0 0 0  Anxiety Difficulty Not difficult at all  - - -        Assessment & Plan:   Problem List Items Addressed This Visit       Cardiovascular and Mediastinum   HTN, goal below 130/80 - Primary    -Controlled. -Continue current medication regimen. -Will collect CMP for medication monitoring. -Will continue to monitor.      Relevant Orders   CBC w/Diff   Comp Met (CMET)   Aortic aneurysm (HCC)     Endocrine   post-operative Hypothyroidism (Chronic)    -Last TSH wnl -Continue current medication regimen. -Rechecking thyroid labs today. Pending results will make medication adjustments if indicated.       Relevant Orders   TSH   T4, free     Other   Adjustment disorder  with mixed anxiety and depressed mood    -Stable. -Continue current medication regimen. See med list. -Will continue to monitor.      Pure hypercholesterolemia    -Pt intolerant to statins and Zetia. -Recommend to follow low fat diet and resume exercise regimen. -Will collect direct LDL. -Will continue to monitor.      Relevant Orders   Direct LDL   Thoracic aortic aneurysm: -Followed by Cardiology. -Stable at 4.1 cm, CT angio chest aorta w/cm & wo/cm 08/28/20.   No orders of the defined types were placed in this encounter.   Follow-up: Return in about 4 months (around 07/22/2021) for [place].    Lorrene Reid, PA-C

## 2021-03-23 NOTE — Assessment & Plan Note (Signed)
-  Controlled. -Continue current medication regimen. -Will collect CMP for medication monitoring. -Will continue to monitor. 

## 2021-03-23 NOTE — Assessment & Plan Note (Signed)
-  Stable.  ?-Continue current medication regimen. See med list. ?-Will continue to monitor. ?

## 2021-03-23 NOTE — Assessment & Plan Note (Signed)
-  Pt intolerant to statins and Zetia. -Recommend to follow low fat diet and resume exercise regimen. -Will collect direct LDL. -Will continue to monitor.

## 2021-03-24 DIAGNOSIS — H524 Presbyopia: Secondary | ICD-10-CM | POA: Diagnosis not present

## 2021-03-24 LAB — CBC WITH DIFFERENTIAL/PLATELET
Basophils Absolute: 0.1 10*3/uL (ref 0.0–0.2)
Basos: 1 %
EOS (ABSOLUTE): 0.2 10*3/uL (ref 0.0–0.4)
Eos: 4 %
Hematocrit: 42 % (ref 34.0–46.6)
Hemoglobin: 13.9 g/dL (ref 11.1–15.9)
Immature Grans (Abs): 0 10*3/uL (ref 0.0–0.1)
Immature Granulocytes: 0 %
Lymphocytes Absolute: 2.5 10*3/uL (ref 0.7–3.1)
Lymphs: 40 %
MCH: 28.6 pg (ref 26.6–33.0)
MCHC: 33.1 g/dL (ref 31.5–35.7)
MCV: 86 fL (ref 79–97)
Monocytes Absolute: 0.5 10*3/uL (ref 0.1–0.9)
Monocytes: 7 %
Neutrophils Absolute: 3 10*3/uL (ref 1.4–7.0)
Neutrophils: 48 %
Platelets: 299 10*3/uL (ref 150–450)
RBC: 4.86 x10E6/uL (ref 3.77–5.28)
RDW: 12.8 % (ref 11.7–15.4)
WBC: 6.3 10*3/uL (ref 3.4–10.8)

## 2021-03-24 LAB — COMPREHENSIVE METABOLIC PANEL
ALT: 9 IU/L (ref 0–32)
AST: 21 IU/L (ref 0–40)
Albumin/Globulin Ratio: 2.1 (ref 1.2–2.2)
Albumin: 4.6 g/dL (ref 3.8–4.8)
Alkaline Phosphatase: 101 IU/L (ref 44–121)
BUN/Creatinine Ratio: 18 (ref 12–28)
BUN: 13 mg/dL (ref 8–27)
Bilirubin Total: 0.3 mg/dL (ref 0.0–1.2)
CO2: 23 mmol/L (ref 20–29)
Calcium: 9.4 mg/dL (ref 8.7–10.3)
Chloride: 101 mmol/L (ref 96–106)
Creatinine, Ser: 0.74 mg/dL (ref 0.57–1.00)
Globulin, Total: 2.2 g/dL (ref 1.5–4.5)
Glucose: 82 mg/dL (ref 70–99)
Potassium: 4.4 mmol/L (ref 3.5–5.2)
Sodium: 140 mmol/L (ref 134–144)
Total Protein: 6.8 g/dL (ref 6.0–8.5)
eGFR: 91 mL/min/{1.73_m2} (ref >59)

## 2021-03-24 LAB — T4, FREE: Free T4: 1.86 ng/dL — ABNORMAL HIGH (ref 0.82–1.77)

## 2021-03-24 LAB — LDL CHOLESTEROL, DIRECT: LDL Direct: 123 mg/dL — ABNORMAL HIGH (ref 0–99)

## 2021-03-24 LAB — TSH: TSH: 1.61 u[IU]/mL (ref 0.450–4.500)

## 2021-03-25 ENCOUNTER — Ambulatory Visit: Payer: Medicare HMO | Admitting: Physician Assistant

## 2021-04-07 NOTE — Progress Notes (Signed)
HPI: FU CAD. Previously followed by Dr Einar Gip. Pt had NSTEMI 0/86 with complicated PCI of RCA (performed in [facility]). ABI 12/15 normal. Cath 6/16 showed 100 PDA, patent stents in proximal and mid RCA, no obstructive disease in the left system; collaterals to the PDA noted; PTCA of CTO unsuccessful. Last cath 10/17 showed 100 PDA (filled with left to right collaterals), patent RCA stent and normal LV function. H/O cough with ACEI. Myalgias with crestor. Dr Martinique reviewed and did not think attempt at CTO intervention or CABG was appropriate.  Neck CTA June 2021 showed no significant carotid stenosis. Most recent echocardiogram July 2021 showed normal LV function, mild left ventricular hypertrophy, grade 1 diastolic dysfunction, hypokinesis of the lateral wall, trace aortic insufficiency and mildly dilated ascending aorta at 42 mm.  CTA June 2022 showed 4.1 cm ascending thoracic aorta, dilated pulmonary artery.  Since last seen, she has dyspnea on exertion.  She describes occasional chest pain/tightness that occurs both with activities and at rest unchanged.  There is no syncope.  Current Outpatient Medications  Medication Sig Dispense Refill   acetaminophen (TYLENOL) 500 MG tablet Take 1,000 mg by mouth every 8 (eight) hours as needed (for pain or headaches).     albuterol (VENTOLIN HFA) 108 (90 Base) MCG/ACT inhaler INHALE 1-2 PUFFS INTO THE LUNGS EVERY 6 (SIX) HOURS AS NEEDED FOR WHEEZING OR SHORTNESS OF BREATH. 1 each 1   amitriptyline (ELAVIL) 50 MG tablet TAKE 1 TABLET AT BEDTIME 90 tablet 1   amLODipine (NORVASC) 5 MG tablet TAKE 1 TABLET EVERY DAY 90 tablet 3   aspirin EC 81 MG tablet Take 81 mg by mouth daily at 6 PM.      B Complex Vitamins (VITAMIN B COMPLEX PO) Take 1 tablet by mouth daily.     budesonide (PULMICORT) 0.25 MG/2ML nebulizer solution Take 2 mLs (0.25 mg total) by nebulization in the morning and at bedtime. 60 mL 3   buPROPion (WELLBUTRIN XL) 150 MG 24 hr tablet TAKE 1 TABLET  EVERY DAY 90 tablet 0   chlorpheniramine-HYDROcodone (TUSSIONEX PENNKINETIC ER) 10-8 MG/5ML SUER Take 5 mLs by mouth every 12 (twelve) hours as needed for cough. 140 mL 0   Cholecalciferol (VITAMIN D3) 5000 units CAPS Take 5,000 Units by mouth daily.      cyclobenzaprine (FLEXERIL) 10 MG tablet Take 1 tablet (10 mg total) by mouth 3 (three) times daily as needed for muscle spasms. 30 tablet 1   DULoxetine (CYMBALTA) 20 MG capsule TAKE 2 CAPSULES EVERY DAY 180 capsule 0   fexofenadine (ALLEGRA) 180 MG tablet Take 1 tablet (180 mg total) by mouth daily. 90 tablet 3   hydrOXYzine (ATARAX/VISTARIL) 25 MG tablet 1-2 tabs by mouth every 6-8 hrs PRN anxiety 60 tablet 0   levothyroxine (SYNTHROID) 175 MCG tablet TAKE 1 TABLET EVERY DAY BEFORE BREAKFAST 90 tablet 0   Multiple Vitamin (MULTIVITAMIN) tablet Take 1 tablet by mouth daily.     nitroGLYCERIN (NITROSTAT) 0.4 MG SL tablet Place 0.4 mg under the tongue every 5 (five) minutes as needed for chest pain.     omeprazole (PRILOSEC) 20 MG capsule TAKE 1 CAPSULE (20 MG TOTAL) BY MOUTH IN THE MORNING AND AT BEDTIME. 180 capsule 1   spironolactone (ALDACTONE) 25 MG tablet TAKE 1 TABLET (25 MG TOTAL) BY MOUTH DAILY. 90 tablet 1   No current facility-administered medications for this visit.     Past Medical History:  Diagnosis Date   Allergy  Anxiety    COPD (chronic obstructive pulmonary disease) (HCC)    Coronary artery disease    a. s/p PCI of RCA in 2015 b. cath in 12/2015 showed 100% Ost RPDA with collaterals present   DDD (degenerative disc disease), cervical    Depression    DJD (degenerative joint disease) of knee    RT   GERD (gastroesophageal reflux disease)    Hashimoto's disease    Headache(784.0)    Hyperlipidemia    Hypertension    Hypothyroidism    Myocardial infarction ([place])    Nocturnal hypoxemia 02/26/2015   On supplemental oxygen therapy    Oxygen 2.5 l/m at bedtime.   Stented coronary artery 02/26/2015    Past  Surgical History:  Procedure Laterality Date   ABDOMINAL HYSTERECTOMY     APPENDECTOMY  1971   BUNIONECTOMY Right    CARDIAC CATHETERIZATION N/A 09/02/2014   Procedure: Left Heart Cath and Coronary Angiography;  Surgeon: Adrian Prows, MD;  Location: [place] [facility];  Service: Cardiovascular;  Laterality: N/A;   CARDIAC CATHETERIZATION N/A 12/15/2015   Procedure: Left Heart Cath and Coronary Angiography;  Surgeon: Jettie Booze, MD;  Location: [place] [facility];  Service: Cardiovascular;  Laterality: N/A;   CERVICAL FUSION     CESAREAN SECTION     COLONOSCOPY WITH PROPOFOL N/A 02/24/2015   Procedure: COLONOSCOPY WITH PROPOFOL;  Surgeon: Garlan Fair, MD;  [facility];  Service: Endoscopy;  Laterality: N/A;   ESOPHAGOGASTRODUODENOSCOPY (EGD) WITH PROPOFOL N/A 02/24/2015   Procedure: ESOPHAGOGASTRODUODENOSCOPY (EGD) WITH PROPOFOL;  Surgeon: Garlan Fair, MD;  [facility];  Service: Endoscopy;  Laterality: N/A;   HAND SURGERY     due to trauma-dog bite.   I & D EXTREMITY Bilateral 05/17/2013   Procedure: IRRIGATION AND DEBRIDEMENT EXTREMITY;  Surgeon: Roseanne Kaufman, MD;  Location: [place];  Service: Orthopedics;  Laterality: Bilateral;   LEFT HEART CATHETERIZATION WITH CORONARY ANGIOGRAM N/A 03/04/2014   Procedure: LEFT HEART CATHETERIZATION WITH CORONARY ANGIOGRAM;  Surgeon: Laverda Page, MD;  Location: [facility];  Service: Cardiovascular;  Laterality: N/A;   OOPHORECTOMY     SHOULDER SURGERY     RT   [place]   TOTAL ABDOMINAL HYSTERECTOMY W/ BILATERAL SALPINGOOPHORECTOMY  01/2008   with cervical dysplasia   TUBAL LIGATION  1984    Social History   Socioeconomic History   Marital status: Widowed    Spouse name: Not on file   Number of children: 1   Years of education: Not on file   Highest education level: Not on file  Occupational History   Occupation: disabled    Employer: [employer]  Tobacco Use    Smoking status: Former    Packs/day: 2.00    Years: 25.00    Pack years: 50.00    Types: Cigarettes    Quit date: 04/23/1996    Years since quitting: 25.0   Smokeless tobacco: Never  Vaping Use   Vaping Use: Never used  Substance and Sexual Activity   Alcohol use: No    Alcohol/week: 0.0 standard drinks   Drug use: No   Sexual activity: Not Currently  Other Topics Concern   Not on file  Social History Narrative   On worker's comp for lower back and right shoulder - applying for disability; losing [employer] insurance 03/07/2010   Social Determinants of Health   Financial Resource Strain: Not on file  Food Insecurity: Not on file  Transportation Needs: Not on file  Physical Activity: Not on file  Stress: Not on file  Social Connections: Not on file  Intimate Partner Violence: Not on file    Family History  Problem Relation Age of Onset   Lung cancer Mother    Hypertension Mother    Stroke Mother    Diabetes Mother    Diabetes Father    Hypertension Father    Heart attack Father    Asthma Father    COPD Father    Alcohol abuse Father    Diabetes Sister    Hypertension Sister     ROS: Fatigue but no fevers or chills, productive cough, hemoptysis, dysphasia, odynophagia, melena, hematochezia, dysuria, hematuria, rash, seizure activity, orthopnea, PND, pedal edema, claudication. Remaining systems are negative.  Physical Exam: Well-developed well-nourished in no acute distress.  Skin is warm and dry.  HEENT is normal.  Neck is supple.  Chest is clear to auscultation with normal expansion.  Cardiovascular exam is regular rate and rhythm.  Abdominal exam nontender or distended. No masses palpated. Extremities show no edema. neuro grossly intact  Electrocardiogram-normal sinus rhythm at a rate of 81, no significant ST changes.  Personally reviewed.  A/P  1 chest pain-patient has had difficulties with chronic chest pain.  Her symptoms are unchanged and  electrocardiogram also unchanged.  Plan to continue medical therapy.  Continue aspirin.  She is intolerant to statins.  She also did not tolerate beta-blockers, nitrates or Ranexa previously.  2 dyspnea-etiology unclear.  Not volume overloaded on examination.  We will arrange echocardiogram to reassess LV function.  3 dilated ascending aorta-we will plan follow-up CTA June 2023.  4 coronary artery disease-continue aspirin.  As outlined above she is intolerant to statins.  5 hyperlipidemia-recent LDL 123.  She is statin intolerant.  She did not tolerate Zetia due to muscle aches by her report.  Repatha was discontinued as she states it was causing a cough.  I will refer to lipid clinic for other suggestions for hyperlipidemia.  6 hypertension-blood pressure borderline.  She has multiple medication intolerances.  She has worsening pedal edema with higher doses of amlodipine.  I will add losartan to see if she tolerates.  Follow blood pressure.  Check potassium and renal function in 1 week.  Kirk Ruths, MD

## 2021-04-10 DIAGNOSIS — Z0100 Encounter for examination of eyes and vision without abnormal findings: Secondary | ICD-10-CM | POA: Diagnosis not present

## 2021-04-12 NOTE — Progress Notes (Signed)
HPI female former smoker followed for COPD mixed type, hemoptysis, complicated by CAD/ MI U2GU WNL MM 138 6MWT-06/23/2014-94%, 93%, 99%, 452 m on room air. No oxygen limitation. PFT-06/23/14- moderate obstructive airways disease with insignificant response to bronchodilator, normal lung capacity, diffusion mildly reduced. FVC 2.64/70%, FEV1 1.92/65%, FEV1/FVC 0.72 ONOX-04/30/14- 314 minutes with oxygen saturation less than or equal to 88% on room air, qualifying for home O2 during sleep ECHO 05/16/13 EF 60-65%, GR1 diast dysfunction HST- 07/16/15- AHI 6/ hr, desaturation to 82%, body weight 186 lbs --------------------------------------------------------------------------------------------------- 12/07/20- 66 year old female former smoker(50 pkyrs) followed for COPD mixed type/ Cough, hemoptysis, complicated by CAD/ MI, RUL nodule, PAH.Covid infecction Sept 2021, GERD, Aortic Aneurysm, -Neb pulmicort, albuterol hfa, tussionex, (Prone to thrush w steroid inhalers,  She has tried and failed Spiriva, Anoro, Symbicort and Breztri for COPD in the past d/t thrush and palpitation symptoms) Covid vax-2 Phizer Pt states coughing and SOB since covid x1 year  Husband died of COVID in 2021-clot from her.  She was treated with infusion.  She still coughs and asks help for this.  Paroxysmal dry cough.  Some recovery of taste and smell but still some mental fog. Chest CT reviewed with her.  She is to discuss her aortic aneurysm with her primary provider.  CTa chest aorta 08/28/20-  IMPRESSION: 1. Stable aneurysmal disease of the thoracic aorta with mild aneurysmal dilatation of the ascending thoracic aorta measuring up to 4.1 cm. Recommend annual imaging followup by CTA or MRA. This recommendation follows 2010 ACCF/AHA/AATS/ACR/ASA/SCA/SCAI/SIR/STS/SVM Guidelines for the Diagnosis and Management of Patients with Thoracic Aortic Disease. Circulation. 2010; 121: R427-C623. Aortic aneurysm NOS (ICD10-I71.9) 2.  Stable dilatation of central pulmonary arteries consistent with some degree of underlying pulmonary hypertension. 3. Stable patchy enhancement pattern of the liver with some degree of hepatic steatosis suspected. Aortic aneurysm NOS (ICD10-I71.9).   04/13/21- 66 year old female former smoker(50 pkyrs) followed for COPD mixed type/ Cough, hemoptysis, complicated by CAD/ MI, RUL nodule, PAH.Covid infection Sept 2021, GERD, Aortic Aneurysm, -Neb pulmicort, albuterol hfa, tussionex, (Prone to thrush w steroid inhalers,  She has tried and failed Spiriva, Anoro, Symbicort and Breztri for COPD in the past d/t thrush and palpitation symptoms) Covid vax-2 Phizer Flu vax-had -----Patient states that she still has a cough that is sometimes dry and sometimes productive with white/yellow sputum. Feels like breathing is the same since last visit. PFT reviewed with her. Mild intermittent cough.  Uses Robitussin and small amounts of Tussionex.  Tessalon was no help.  Usually nonproductive.  She uses nebulized Pulmicort and albuterol rescue inhalers occasionally as needed, averaging once or twice a week and says these help.  Some persistent hoarseness she thinks may reflect postnasal drip. May need ENT if persistent. PFT 02/08/21- mild obstruction, no resp to BD, Nl DLCO CXR 12/07/20- IMPRESSION: 1. No radiographic evidence of acute cardiopulmonary disease. 2. Mild scarring in the left upper lobe is unchanged. 3. Aortic atherosclerosis.  ROS-see HPI      + = positive Constitutional:   No-   weight loss, night sweats, fevers, chills, fatigue, lassitude. HEENT:   + headaches, difficulty swallowing, tooth/dental problems, sore throat,       No-  sneezing, itching, ear ache, +nasal congestion, +post nasal drip,  CV:  +chest pain, orthopnea, PND, swelling in lower extremities, anasarca,         dizziness, +palpitations Resp: +shortness of breath with exertion or at rest.  productive cough,  +  non-productive cough,   coughing up of blood.               change in color of mucus.  wheezing.   Skin: No-   rash or lesions. GI:  + heartburn, indigestion, abdominal pain, nausea, vomiting,  GU:  MS:  +  joint pain or swelling.   Neuro-     nothing unusual Psych:  No- change in mood or affect. No depression or anxiety.  No memory loss.  OBJ- Physical Exam General- Alert, Oriented, Affect-appropriate, Distress- none acute Skin- rash-none, lesions- none, excoriation- none Lymphadenopathy- none Head- atraumatic            Eyes- Gross vision intact, PERRLA, conjunctivae and secretions clear            Ears- Hearing, canals-normal            Nose- Clear, no-Septal dev, mucus, polyps, erosion, perforation             Throat- Mallampati II , mucosa clear , drainage- none, tonsils- atrophic,                    + dentures Neck- flexible , trachea midline, no stridor , thyroid nl, carotid no bruit Chest - symmetrical excursion , unlabored           Heart/CV- RRR , no murmur , no gallop  , no rub, nl s1 s2                           - JVD- none , edema- none, stasis changes- none, varices- none           Lung- clear to P&A, wheeze- none, cough+ raspy, dullness-none, rub- none           Chest wall-  Abd-  Br/ Gen/ Rectal- Not done, not indicated Extrem- cyanosis- none, clubbing, none, atrophy- none, strength- nl Neuro- grossly intact to observation

## 2021-04-13 ENCOUNTER — Other Ambulatory Visit: Payer: Self-pay

## 2021-04-13 ENCOUNTER — Encounter: Payer: Self-pay | Admitting: Internal Medicine

## 2021-04-13 ENCOUNTER — Ambulatory Visit: Payer: Medicare HMO | Admitting: Internal Medicine

## 2021-04-13 DIAGNOSIS — J449 Chronic obstructive pulmonary disease, unspecified: Secondary | ICD-10-CM

## 2021-04-13 DIAGNOSIS — J302 Other seasonal allergic rhinitis: Secondary | ICD-10-CM

## 2021-04-13 DIAGNOSIS — J3089 Other allergic rhinitis: Secondary | ICD-10-CM

## 2021-04-13 NOTE — Assessment & Plan Note (Signed)
She feels that occasional as needed use of nebulized Pulmicort and rescue albuterol are effective.  Not sure intermittent use of Pulmicort will accomplish much but she has been very sensitive to thrush in the past.  I noticed a lot of throat clearing today.  With her smoking history, if this does not respond easily to conservative measures, we may want to ask ENT for laryngoscopy. Meanwhile continue current inhaled medicines.

## 2021-04-13 NOTE — Assessment & Plan Note (Signed)
Question of postnasal drip contributes to her hoarseness and cough.  She has been very sensitive to thrush from inhaled steroids. Plan-try Nasalcrom nasal spray, Marolyn Hammock rancher hard candies.  Consider ENT evaluation.

## 2021-04-13 NOTE — Patient Instructions (Addendum)
Suggest otc nasal spray Nasalcrom    Use it regularly for a week or 2 to see if it helps the sense of drainage and hoarseness.  If you keep feeling hoarseness and drainage, please let me know and we will get an ENT to look in your throat.  Try sugar free throat lozenges (like Zero Sugar [employer], in bags at [employer], [employer])  Call when you need refills

## 2021-04-20 ENCOUNTER — Ambulatory Visit: Payer: Medicare HMO | Admitting: Cardiology

## 2021-04-20 ENCOUNTER — Other Ambulatory Visit: Payer: Self-pay

## 2021-04-20 ENCOUNTER — Encounter: Payer: Self-pay | Admitting: Cardiology

## 2021-04-20 VITALS — BP 130/90 | HR 88 | Ht 66.0 in | Wt 195.0 lb

## 2021-04-20 DIAGNOSIS — E7800 Pure hypercholesterolemia, unspecified: Secondary | ICD-10-CM | POA: Diagnosis not present

## 2021-04-20 DIAGNOSIS — I712 Thoracic aortic aneurysm, without rupture, unspecified: Secondary | ICD-10-CM | POA: Diagnosis not present

## 2021-04-20 DIAGNOSIS — R0602 Shortness of breath: Secondary | ICD-10-CM | POA: Diagnosis not present

## 2021-04-20 DIAGNOSIS — I10 Essential (primary) hypertension: Secondary | ICD-10-CM | POA: Diagnosis not present

## 2021-04-20 DIAGNOSIS — I2510 Atherosclerotic heart disease of native coronary artery without angina pectoris: Secondary | ICD-10-CM

## 2021-04-20 MED ORDER — LOSARTAN POTASSIUM 50 MG PO TABS: 50.0000 mg | ORAL_TABLET | Freq: Every day | ORAL | 3 refills | Status: DC

## 2021-04-20 NOTE — Patient Instructions (Signed)
Medication Instructions:   START LOSARTAN 50 MG ONCE DAILY  *If you need a refill on your cardiac medications before your next appointment, please call your pharmacy*   Lab Work:  Your physician recommends that you return for lab work in: [place]  If you have labs (blood work) drawn today and your tests are completely normal, you will receive your results only by: [place] (if you have MyChart) OR A paper copy in the mail If you have any lab test that is abnormal or we need to change your treatment, we will call you to review the results.   Testing/Procedures:  Your physician has requested that you have an echocardiogram. Echocardiography is a painless test that uses sound waves to create images of your heart. It provides your doctor with information about the size and shape of your heart and how well your hearts chambers and valves are working. This procedure takes approximately one hour. There are no restrictions for this procedure. [place]   Follow-Up: At [facility], you and your health needs are our priority.  As part of our continuing mission to provide you with exceptional heart care, we have created designated Provider Care Teams.  These Care Teams include your primary Cardiologist (physician) and Advanced Practice Providers (APPs -  Physician Assistants and Nurse Practitioners) who all work together to provide you with the care you need, when you need it.  We recommend signing up for the patient portal called "MyChart".  Sign up information is provided on this After Visit Summary.  MyChart is used to connect with patients for Virtual Visits (Telemedicine).  Patients are able to view lab/test results, encounter notes, upcoming appointments, etc.  Non-urgent messages can be sent to your provider as well.   To learn more about what you can do with MyChart, go to [email or web address].    Your next appointment:   6  month(s)  The format for your next appointment:   In Person  Provider:   Kirk Ruths, MD

## 2021-04-21 DIAGNOSIS — H40003 Preglaucoma, unspecified, bilateral: Secondary | ICD-10-CM | POA: Diagnosis not present

## 2021-04-24 DIAGNOSIS — J019 Acute sinusitis, unspecified: Secondary | ICD-10-CM | POA: Diagnosis not present

## 2021-04-26 ENCOUNTER — Encounter: Payer: Self-pay | Admitting: Internal Medicine

## 2021-04-26 DIAGNOSIS — R490 Dysphonia: Secondary | ICD-10-CM

## 2021-04-26 MED ORDER — HYDROCOD POLI-CHLORPHE POLI ER 10-8 MG/5ML PO SUER: ORAL | 0 refills | Status: DC

## 2021-04-26 NOTE — Telephone Encounter (Signed)
Tussionexx refilled  Order- please refer to [facility] on [facility] for persistent hoarseness

## 2021-04-26 NOTE — Telephone Encounter (Signed)
Please advise on cough syrup refill.

## 2021-04-27 ENCOUNTER — Encounter: Payer: Self-pay | Admitting: Physician Assistant

## 2021-04-27 ENCOUNTER — Ambulatory Visit (INDEPENDENT_AMBULATORY_CARE_PROVIDER_SITE_OTHER): Payer: Medicare HMO | Admitting: Physician Assistant

## 2021-04-27 ENCOUNTER — Other Ambulatory Visit (HOSPITAL_COMMUNITY): Payer: Medicare HMO

## 2021-04-27 ENCOUNTER — Other Ambulatory Visit: Payer: Self-pay

## 2021-04-27 VITALS — BP 118/73 | HR 93 | Temp 98.1°F | Ht 66.5 in | Wt 192.0 lb

## 2021-04-27 DIAGNOSIS — J0141 Acute recurrent pansinusitis: Secondary | ICD-10-CM | POA: Diagnosis not present

## 2021-04-27 MED ORDER — CEFTRIAXONE SODIUM 1 G IJ SOLR
1.0000 g | Freq: Once | INTRAMUSCULAR | Status: AC
  Administered 2021-04-27: 1 g via INTRAMUSCULAR

## 2021-04-27 MED ORDER — METHYLPREDNISOLONE SODIUM SUCC 40 MG IJ SOLR
40.0000 mg | Freq: Once | INTRAMUSCULAR | Status: AC
  Administered 2021-04-27: 40 mg via INTRAMUSCULAR

## 2021-04-27 NOTE — Progress Notes (Signed)
Established patient acute visit   Patient: Amy Hodge   DOB: 05/13/1955   66 y.o. Female  MRN: [id number] Visit Date: 04/27/2021  Chief Complaint  Patient presents with   Acute Visit   Cough   Sore Throat   Fever   Subjective    HPI  Patient presents with c/o cough, sore throat, nasal congestion with green mucus, runny nose, headache, sinus pressure bilateral ear fullness and fever (has not checked temperature but had chills and night sweats last night). Symptoms started 5 days ago. Patient reports when to [facility] in [place] Saturday and was tested for influenza and rapid strep which were negative. She was given a decongestant which helped initially but not currently. Has been taking Robitussin. Feeling worse today. Did a Covid test last night and it was negative.       Medications: Outpatient Medications Prior to Visit  Medication Sig   acetaminophen (TYLENOL) 500 MG tablet Take 1,000 mg by mouth every 8 (eight) hours as needed (for pain or headaches).   albuterol (VENTOLIN HFA) 108 (90 Base) MCG/ACT inhaler INHALE 1-2 PUFFS INTO THE LUNGS EVERY 6 (SIX) HOURS AS NEEDED FOR WHEEZING OR SHORTNESS OF BREATH.   amitriptyline (ELAVIL) 50 MG tablet TAKE 1 TABLET AT BEDTIME   amLODipine (NORVASC) 5 MG tablet TAKE 1 TABLET EVERY DAY   aspirin EC 81 MG tablet Take 81 mg by mouth daily at 6 PM.    B Complex Vitamins (VITAMIN B COMPLEX PO) Take 1 tablet by mouth daily.   budesonide (PULMICORT) 0.25 MG/2ML nebulizer solution Take 2 mLs (0.25 mg total) by nebulization in the morning and at bedtime.   buPROPion (WELLBUTRIN XL) 150 MG 24 hr tablet TAKE 1 TABLET EVERY DAY   chlorpheniramine-HYDROcodone (TUSSIONEX PENNKINETIC ER) 10-8 MG/5ML SUER Take 5 mLs by mouth every 12 (twelve) hours as needed for cough.   chlorpheniramine-HYDROcodone (TUSSIONEX PENNKINETIC ER) 10-8 MG/5ML 5 ml every 12 hours if needed for cough   Cholecalciferol (VITAMIN D3) 5000 units CAPS Take 5,000  Units by mouth daily.    cyclobenzaprine (FLEXERIL) 10 MG tablet Take 1 tablet (10 mg total) by mouth 3 (three) times daily as needed for muscle spasms.   DULoxetine (CYMBALTA) 20 MG capsule TAKE 2 CAPSULES EVERY DAY   fexofenadine (ALLEGRA) 180 MG tablet Take 1 tablet (180 mg total) by mouth daily.   hydrOXYzine (ATARAX/VISTARIL) 25 MG tablet 1-2 tabs by mouth every 6-8 hrs PRN anxiety   levothyroxine (SYNTHROID) 175 MCG tablet TAKE 1 TABLET EVERY DAY BEFORE BREAKFAST   losartan (COZAAR) 50 MG tablet Take 1 tablet (50 mg total) by mouth daily.   Multiple Vitamin (MULTIVITAMIN) tablet Take 1 tablet by mouth daily.   nitroGLYCERIN (NITROSTAT) 0.4 MG SL tablet Place 0.4 mg under the tongue every 5 (five) minutes as needed for chest pain.   omeprazole (PRILOSEC) 20 MG capsule TAKE 1 CAPSULE (20 MG TOTAL) BY MOUTH IN THE MORNING AND AT BEDTIME.   spironolactone (ALDACTONE) 25 MG tablet TAKE 1 TABLET (25 MG TOTAL) BY MOUTH DAILY.   No facility-administered medications prior to visit.    Review of Systems Review of Systems:  A fourteen system review of systems was performed and found to be positive as per HPI.     Objective    BP 118/73    Pulse 93    Temp 98.1 F (36.7 C)    Ht 5' 6.5" (1.689 m)    Wt 192 lb (87.1 kg)  SpO2 93%    BMI 30.53 kg/m    Physical Exam Constitutional:      General: She is not in acute distress.    Appearance: She is ill-appearing.  HENT:     Head: Normocephalic and atraumatic.     Comments: Frontal and maxillary sinus tenderness    Right Ear: Ear canal normal. No drainage or swelling. Tympanic membrane is not erythematous.     Left Ear: Ear canal normal. No drainage or swelling. Tympanic membrane is not erythematous.     Nose: Congestion present.     Mouth/Throat:     Mouth: No oral lesions.     Pharynx: Posterior oropharyngeal erythema present. No oropharyngeal exudate.     Tonsils: No tonsillar exudate. 1+ on the right. 0 on the left.  Eyes:      Conjunctiva/sclera: Conjunctivae normal.     Pupils: Pupils are equal, round, and reactive to light.  Cardiovascular:     Rate and Rhythm: Normal rate and regular rhythm.     Heart sounds: Normal heart sounds.  Pulmonary:     Effort: No respiratory distress.     Breath sounds: Wheezing and rhonchi present. No rales.  Musculoskeletal:     Cervical back: Neck supple.  Lymphadenopathy:     Cervical: Cervical adenopathy present.  Skin:    General: Skin is warm and dry.  Neurological:     General: No focal deficit present.     Mental Status: She is alert.      No results found for any visits on 04/27/21.  Assessment & Plan     Due to severity and worsening symptoms will start antibiotic therapy for suspected bacterial sinusitis. Patient has several medication allergies and is agreeable to ceftriaxone 1 gram injection today. Patient has significant adventitious sounds on exam (hx of COPD) and will administer solu-medrol 40 mg. Advised if not feeling better tomorrow to let me know and will administer another dose of ceftriaxone, pt verbalized understanding. Recommend to continue with home supportive care including decongestant.   Return if symptoms worsen or fail to improve.        Lorrene Reid, PA-C  [facility] [phone] (phone) [phone] (fax)  [place]

## 2021-04-28 ENCOUNTER — Other Ambulatory Visit: Payer: Self-pay

## 2021-04-28 ENCOUNTER — Ambulatory Visit: Payer: Medicare HMO

## 2021-04-30 DIAGNOSIS — I10 Essential (primary) hypertension: Secondary | ICD-10-CM | POA: Diagnosis not present

## 2021-04-30 LAB — BASIC METABOLIC PANEL
BUN/Creatinine Ratio: 11 — ABNORMAL LOW (ref 12–28)
BUN: 9 mg/dL (ref 8–27)
CO2: 28 mmol/L (ref 20–29)
Calcium: 9.5 mg/dL (ref 8.7–10.3)
Chloride: 97 mmol/L (ref 96–106)
Creatinine, Ser: 0.85 mg/dL (ref 0.57–1.00)
Glucose: 74 mg/dL (ref 70–99)
Potassium: 4.6 mmol/L (ref 3.5–5.2)
Sodium: 138 mmol/L (ref 134–144)
eGFR: 77 mL/min/{1.73_m2} (ref >59)

## 2021-05-04 ENCOUNTER — Other Ambulatory Visit: Payer: Self-pay

## 2021-05-04 ENCOUNTER — Other Ambulatory Visit: Payer: Medicare HMO

## 2021-05-04 ENCOUNTER — Other Ambulatory Visit: Payer: Self-pay | Admitting: Physician Assistant

## 2021-05-04 DIAGNOSIS — E890 Postprocedural hypothyroidism: Secondary | ICD-10-CM

## 2021-05-05 LAB — T3: T3, Total: 135 ng/dL (ref 71–180)

## 2021-05-05 LAB — TSH: TSH: 1.19 u[IU]/mL (ref 0.450–4.500)

## 2021-05-05 LAB — T4, FREE: Free T4: 1.73 ng/dL (ref 0.82–1.77)

## 2021-05-10 ENCOUNTER — Other Ambulatory Visit: Payer: Self-pay

## 2021-05-10 ENCOUNTER — Ambulatory Visit (HOSPITAL_COMMUNITY): Payer: Medicare HMO | Attending: Internal Medicine

## 2021-05-10 DIAGNOSIS — R0602 Shortness of breath: Secondary | ICD-10-CM | POA: Insufficient documentation

## 2021-05-10 LAB — ECHOCARDIOGRAM COMPLETE
Area-P 1/2: 3.3 cm2
S' Lateral: 3.6 cm

## 2021-05-16 DIAGNOSIS — Z20822 Contact with and (suspected) exposure to covid-19: Secondary | ICD-10-CM | POA: Diagnosis not present

## 2021-05-16 DIAGNOSIS — B349 Viral infection, unspecified: Secondary | ICD-10-CM | POA: Diagnosis not present

## 2021-05-20 IMAGING — MR MR HEAD W/O CM
12 of 13 series · 43 of 48 positions shown · non-contrast
Comparison: CT head 08/23/2019.  MRI head 07/18/2008

CLINICAL DATA: Acute neuro deficit. Rule out stroke. Blurred vision
and left-sided weakness.

EXAM:
MRI HEAD WITHOUT CONTRAST
TECHNIQUE: Multiplanar, multiecho pulse sequences of the brain and surrounding
structures were obtained without intravenous contrast.

[Series 9: DWI · axial · 3.0mm · 0.88mm/px · z∈[-125,+14]mm · 6 of 96 slices shown (1 of 4)]
[im 1/96]
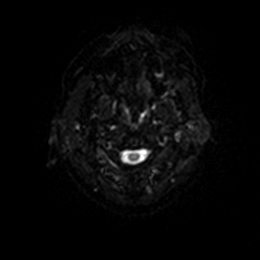
[im 20/96]
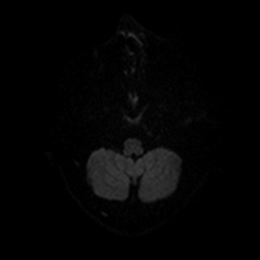
[im 39/96]
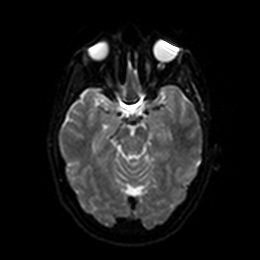
[im 58/96]
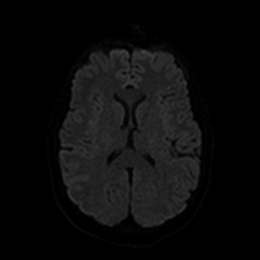
[im 77/96]
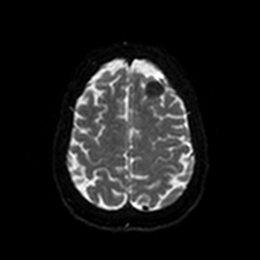
[im 96/96]
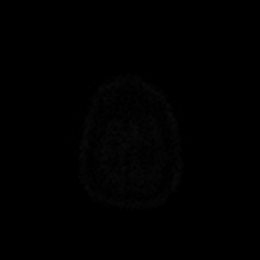

[Series 10: DWI · axial · 3.0mm · 0.88mm/px · z∈[-125,+14]mm · 4 of 48 slices shown (2 of 4)]
[im 1/48]
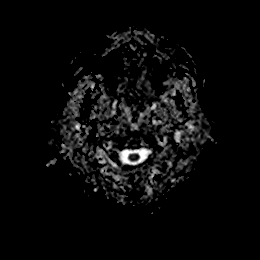
[im 16/48]
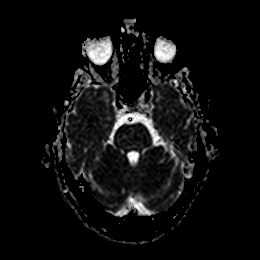
[im 32/48]
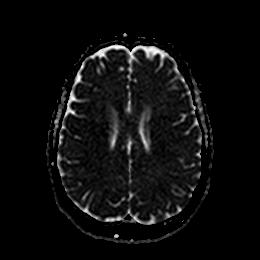
[im 48/48]
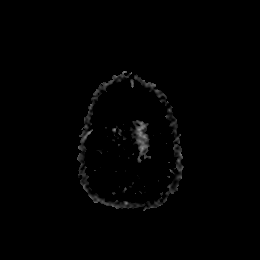

[Series 11: DWI · coronal · 4.0mm · 0.88mm/px · 5 of 64 slices shown (3 of 4)]
[im 1/64]
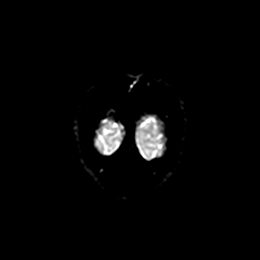
[im 16/64]
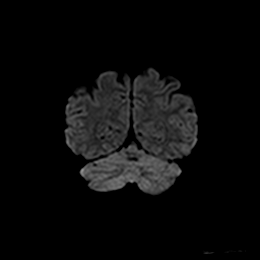
[im 32/64]
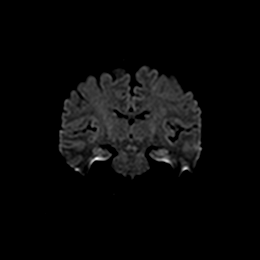
[im 48/64]
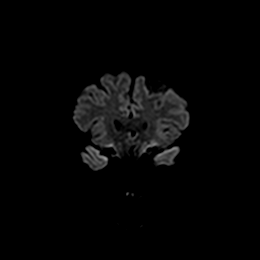
[im 64/64]
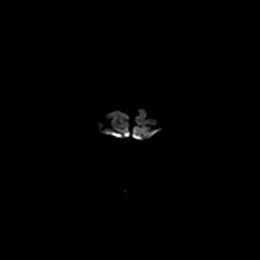

[Series 12: DWI · coronal · 4.0mm · 0.88mm/px · 2 of 32 slices shown (4 of 4)]
[im 1/32]
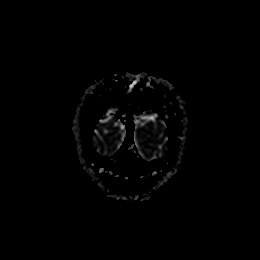
[im 32/32]
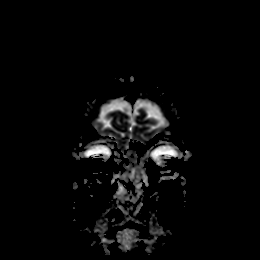

[Series 13: T1 · sagittal · 5.0mm · 0.75mm/px · 2 of 23 slices shown]
[im 1/23]
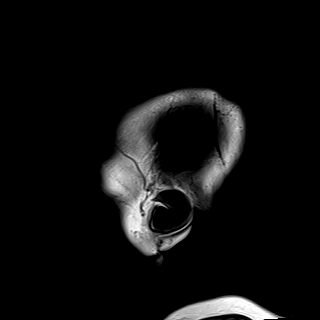
[im 23/23]
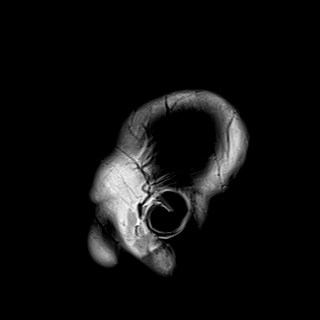

[Series 14: T2 · axial · 5.0mm · 0.72mm/px · z∈[-133,+10]mm · 2 of 25 slices shown (1 of 2)]
[im 1/25]
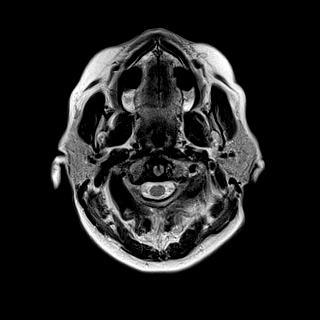
[im 25/25]
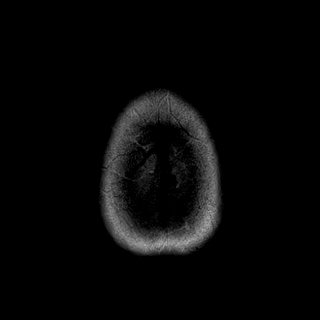

[Series 15: FLAIR · axial · 5.0mm · 0.45mm/px · z∈[-134,+9]mm · 2 of 25 slices shown]
[im 1/25]
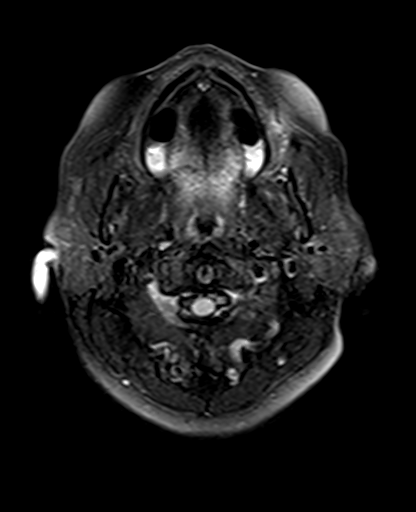
[im 25/25]
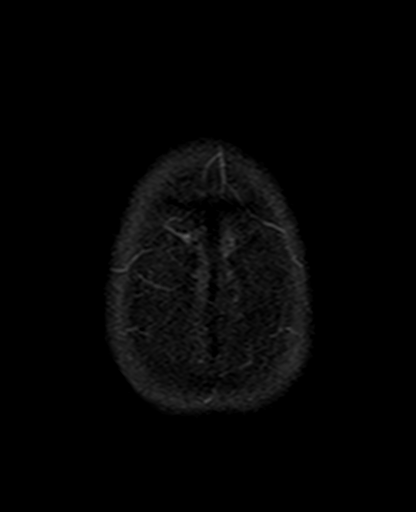

[Series 16: mag_images · axial · 3.0mm · 0.90mm/px · z∈[-144,+32]mm · 5 of 60 slices shown]
[im 1/60]
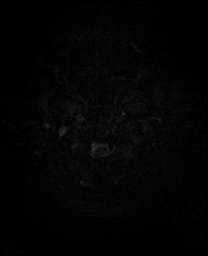
[im 15/60]
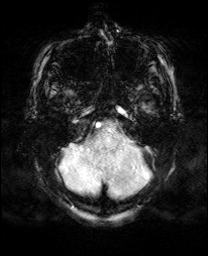
[im 30/60]
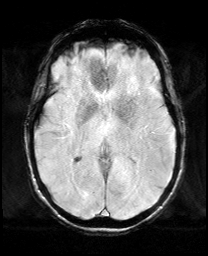
[im 45/60]
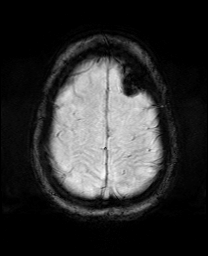
[im 60/60]
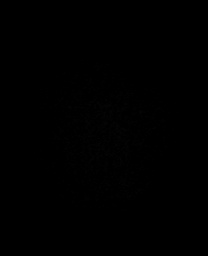

[Series 17: pha_images · axial · 3.0mm · 0.90mm/px · z∈[-141,+32]mm · 4 of 57 slices shown]
[im 1/57]
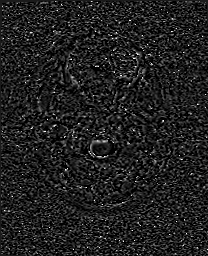
[im 19/57]
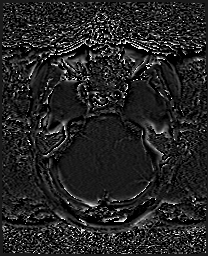
[im 38/57]
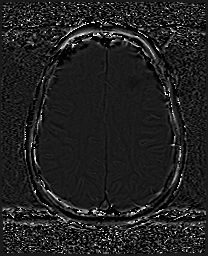
[im 57/57]
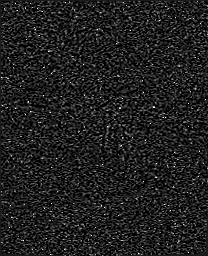

[Series 18: swi_images · axial · 3.0mm · 0.90mm/px · z∈[-144,+32]mm · 5 of 60 slices shown]
[im 1/60]
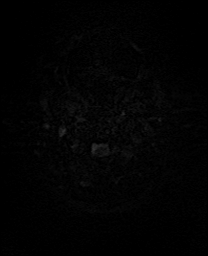
[im 15/60]
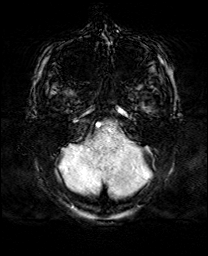
[im 30/60]
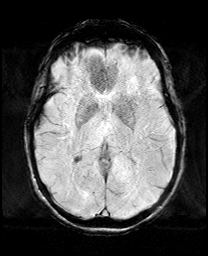
[im 45/60]
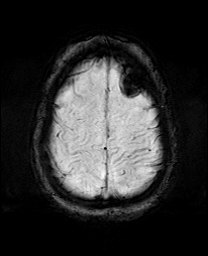
[im 60/60]
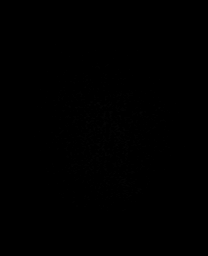

[Series 19: mip_images(sw) · axial · 24.0mm · 0.90mm/px · z∈[-133,+22]mm · 4 of 53 slices shown]
[im 1/53]
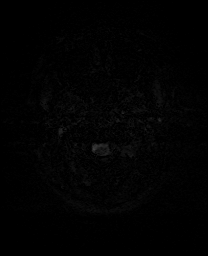
[im 18/53]
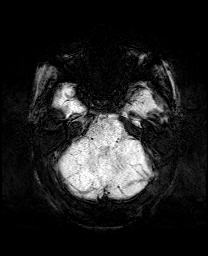
[im 35/53]
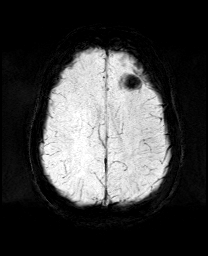
[im 53/53]
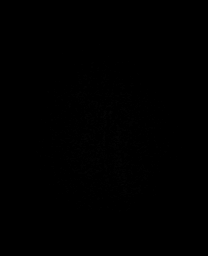

[Series 21: T2 · coronal · 5.0mm · 0.34mm/px · 2 of 29 slices shown (2 of 2)]
[im 1/29]
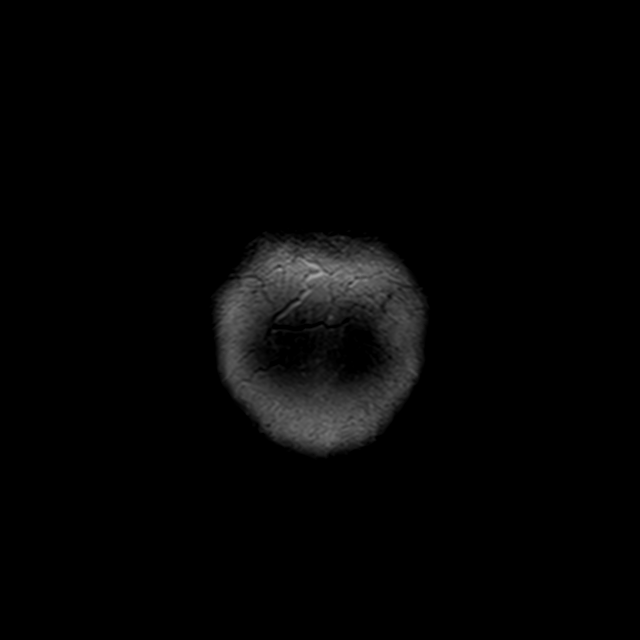
[im 29/29]
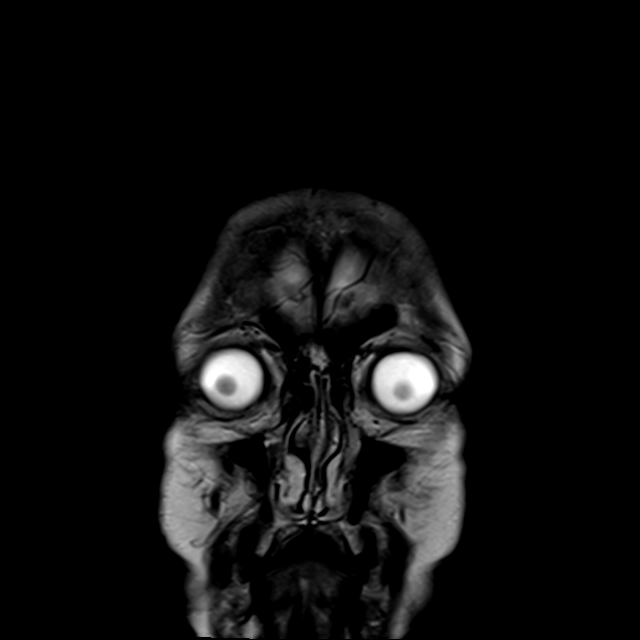

[43 of 48 positions shown; findings below may reference images not displayed]

FINDINGS: Brain: Ventricle size and cerebral volume normal. Negative for acute
infarct. Few small deep white matter hyperintensities bilaterally
and in the right pons most compatible with chronic microvascular
ischemia. Negative for hemorrhage

Calcified extra-axial mass left frontal lobe over the convexity.
This measures approximately 20 mm in diameter and shows mild
interval growth since 8131. No edema in the adjacent brain.

Vascular: Normal arterial flow voids.

Skull and upper cervical spine: Normal bone marrow.

Sinuses/Orbits: Negative

Other: None
IMPRESSION: Negative for acute infarct

Mild chronic microvascular ischemic change in the white matter and
pons

20 mm calcified meningioma left frontal lobe without adjacent brain
edema. Mild interval growth since [DATE]

## 2021-05-20 IMAGING — CT CT ANGIO HEAD-NECK
1 of 8 series · 14 of 47 positions shown · IV contrast (APPLIED)
Comparison: CT head and MRI head 08/23/2019

CLINICAL DATA: Acute neuro deficit.  Suspect stroke

EXAM:
CT ANGIOGRAPHY HEAD AND NECK
TECHNIQUE: Multidetector CT imaging of the head and neck was performed using
the standard protocol during bolus administration of intravenous
contrast. Multiplanar CT image reconstructions and MIPs were
obtained to evaluate the vascular anatomy. Carotid stenosis
measurements (when applicable) are obtained utilizing NASCET
criteria, using the distal internal carotid diameter as the
denominator.
CONTRAST:  75mL OMNIPAQUE IOHEXOL 350 MG/ML SOLN

[Series 15: thin · axial · 0.40mm/px · z∈[-382,-69]mm · 14 of 723 slices shown]
[im 49/723  brain]
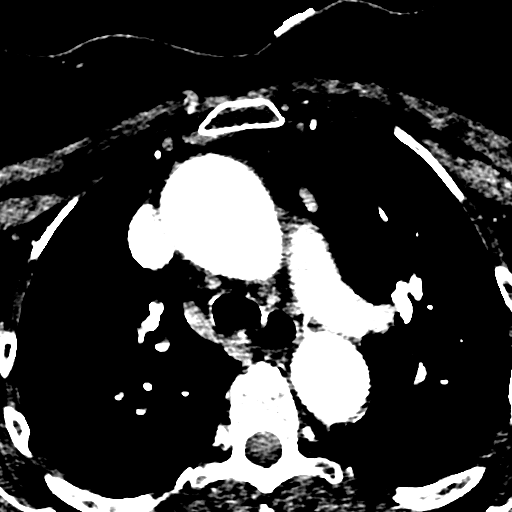
[im 97/723  bone]
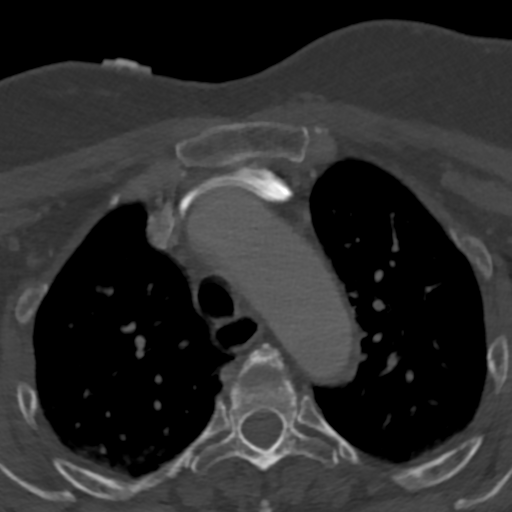
[im 145/723  brain]
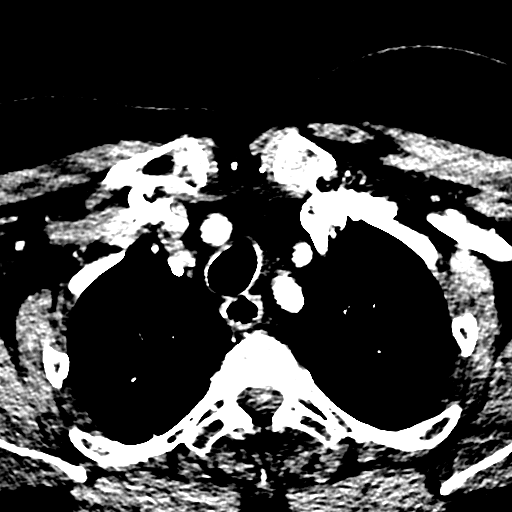
[im 193/723  bone]
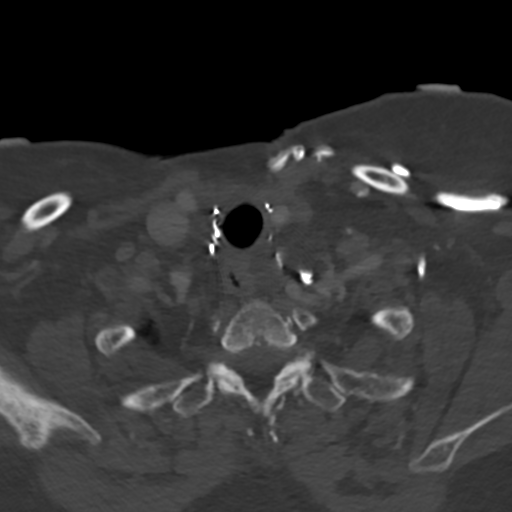
[im 241/723  brain]
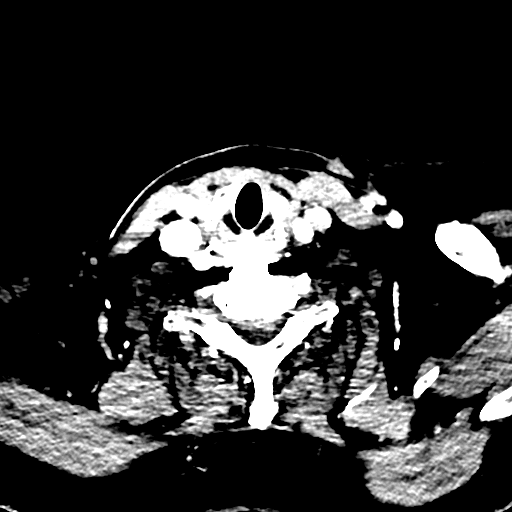
[im 289/723  bone]
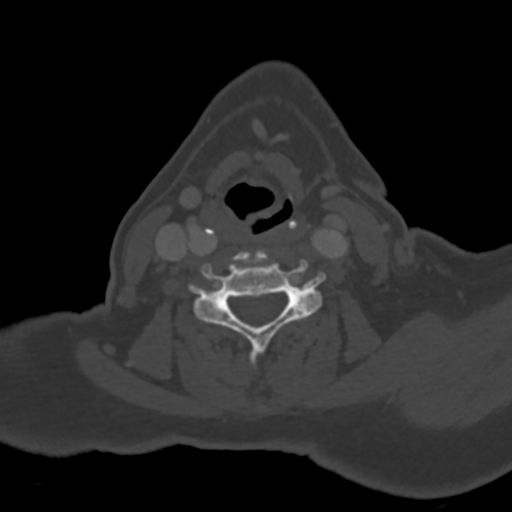
[im 337/723  brain]
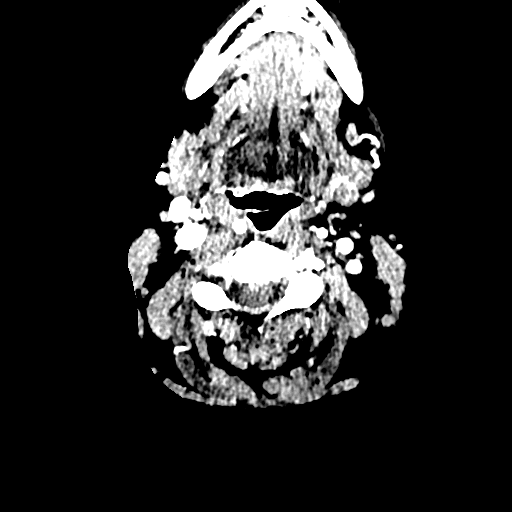
[im 386/723  bone]
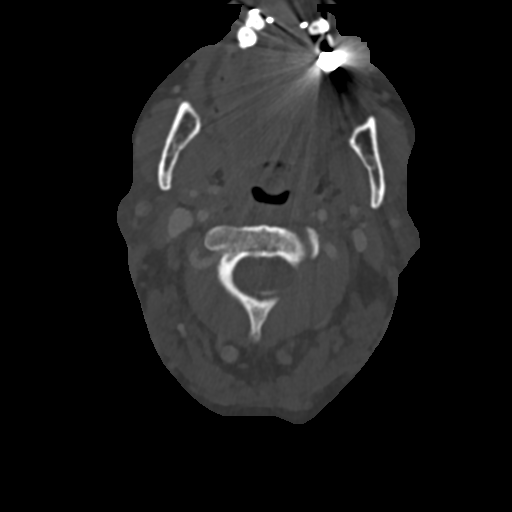
[im 434/723  brain]
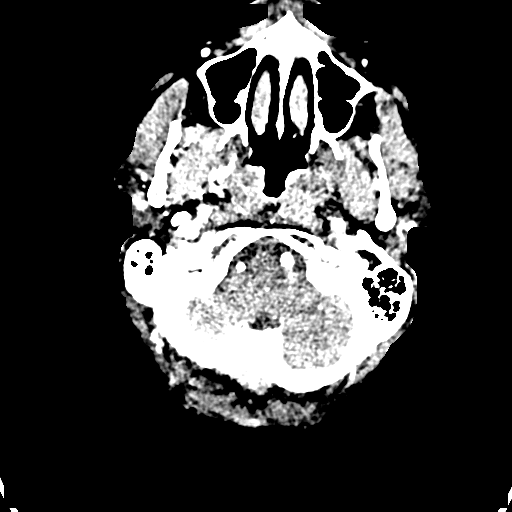
[im 482/723  bone]
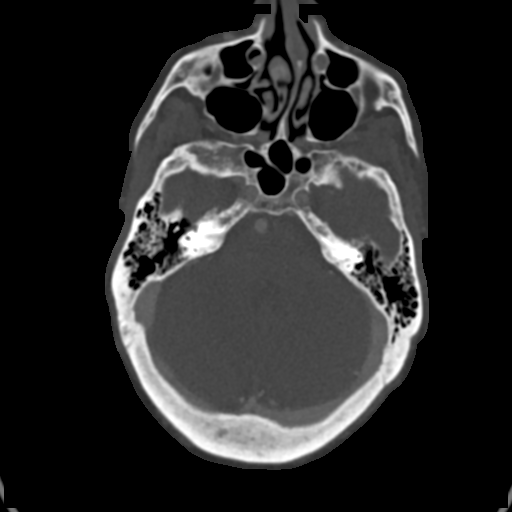
[im 530/723  brain]
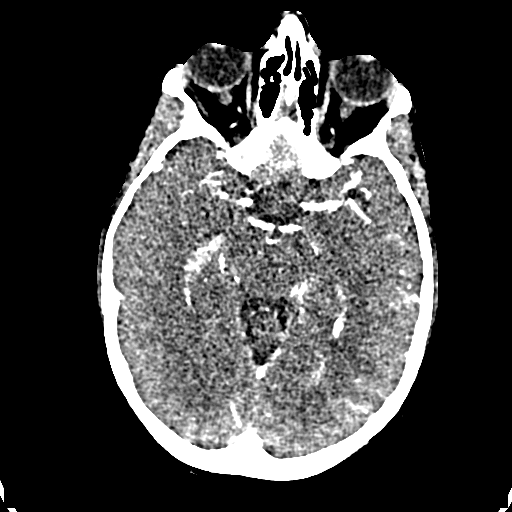
[im 578/723  bone]
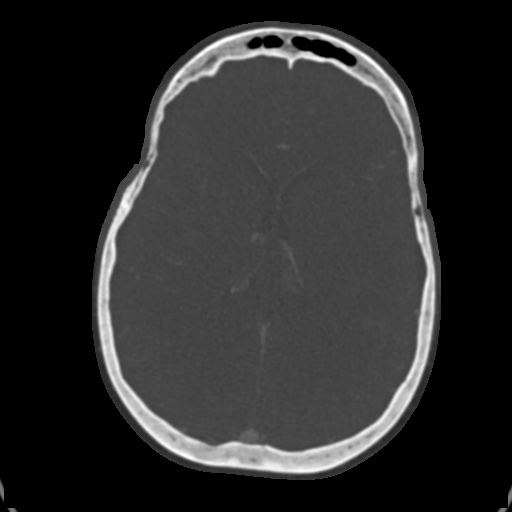
[im 626/723  brain]
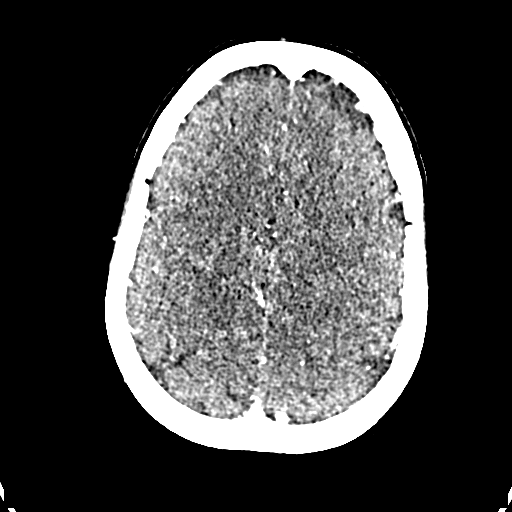
[im 674/723  bone]
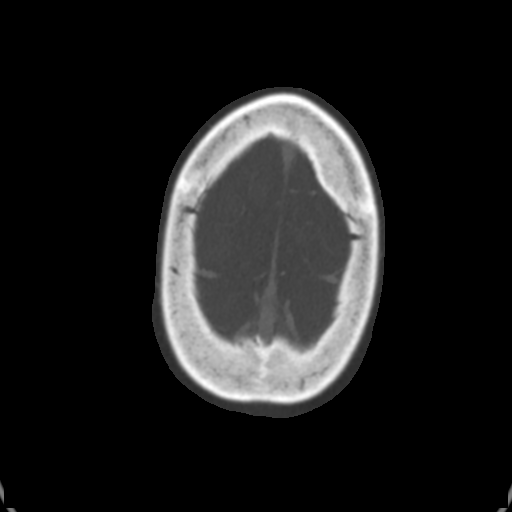

[14 of 47 positions shown; findings below may reference images not displayed]

FINDINGS: CTA NECK FINDINGS

Aortic arch: Ascending aorta 41 mm in diameter. Mild atherosclerotic
disease. 3 vessel aortic arch. Proximal great vessels widely patent.

Right carotid system: Mild atherosclerotic calcification right
carotid bifurcation without significant stenosis.

Left carotid system: Mild atherosclerotic calcification left carotid
bifurcation without significant stenosis.

Vertebral arteries: Both vertebral arteries patent to the basilar
without stenosis.

Skeleton: ACDF C5-6 with solid fusion. Disc degeneration and
spurring at C3-4. No acute skeletal abnormality.

Other neck: Thyroidectomy. 15 mm cyst posterolateral to the trachea
with mass-effect on the esophagus. Possible postop fluid collection.
No enlarged lymph nodes in the neck.

Upper chest: Lung apices clear bilaterally.

Review of the MIP images confirms the above findings

CTA HEAD FINDINGS

Anterior circulation: Mild atherosclerotic disease in the cavernous
carotid on the left. No significant carotid stenosis bilaterally.
Anterior and middle cerebral arteries patent bilaterally without
stenosis or aneurysm. Azygos anterior cerebral artery, normal
variant.

Posterior circulation: Both vertebral arteries patent to the
basilar. PICA patent bilaterally. Basilar widely patent. Superior
cerebellar and posterior cerebral arteries patent bilaterally.

Venous sinuses: Normal venous enhancement

Anatomic variants: None

19 mm calcified meningioma left frontal lobe.

Review of the MIP images confirms the above findings
IMPRESSION: 1. Negative for intracranial large vessel occlusion. No significant
intracranial stenosis.
2. No significant carotid or vertebral artery stenosis in the neck.
Mild atherosclerotic disease of the carotid bifurcation bilaterally.
3. Postop thyroidectomy. 15 mm cyst posterior to the left thyroid
bed may be postop seroma. Correlate with prior surgical findings.
4. Ascending aorta dilated 41 mm. Recommend annual imaging followup
by CTA or MRA. This recommendation follows 4242
ACCF/AHA/AATS/ACR/ASA/SCA/ZILINSKI/DON LOLITO/PADAM/QASWARA Guidelines for the
Diagnosis and Management of Patients with Thoracic Aortic Disease.
Circulation. 4242; 121: E266-e369. Aortic aneurysm NOS ([id number].H)

## 2021-05-21 ENCOUNTER — Ambulatory Visit: Payer: Medicare HMO

## 2021-06-30 ENCOUNTER — Other Ambulatory Visit: Payer: Self-pay | Admitting: Physician Assistant

## 2021-06-30 DIAGNOSIS — E890 Postprocedural hypothyroidism: Secondary | ICD-10-CM

## 2021-07-12 ENCOUNTER — Other Ambulatory Visit: Payer: Self-pay | Admitting: Physician Assistant

## 2021-07-12 DIAGNOSIS — F4323 Adjustment disorder with mixed anxiety and depressed mood: Secondary | ICD-10-CM

## 2021-07-20 DIAGNOSIS — R490 Dysphonia: Secondary | ICD-10-CM | POA: Diagnosis not present

## 2021-07-20 DIAGNOSIS — J3800 Paralysis of vocal cords and larynx, unspecified: Secondary | ICD-10-CM | POA: Diagnosis not present

## 2021-07-20 DIAGNOSIS — K219 Gastro-esophageal reflux disease without esophagitis: Secondary | ICD-10-CM | POA: Insufficient documentation

## 2021-07-21 ENCOUNTER — Ambulatory Visit (INDEPENDENT_AMBULATORY_CARE_PROVIDER_SITE_OTHER): Payer: Medicare HMO | Admitting: Physician Assistant

## 2021-07-21 ENCOUNTER — Encounter: Payer: Self-pay | Admitting: Physician Assistant

## 2021-07-21 VITALS — BP 111/72 | HR 86 | Temp 97.7°F | Ht 66.5 in | Wt 192.0 lb

## 2021-07-21 DIAGNOSIS — Z0000 Encounter for general adult medical examination without abnormal findings: Secondary | ICD-10-CM | POA: Diagnosis not present

## 2021-07-21 DIAGNOSIS — B001 Herpesviral vesicular dermatitis: Secondary | ICD-10-CM | POA: Diagnosis not present

## 2021-07-21 DIAGNOSIS — M5416 Radiculopathy, lumbar region: Secondary | ICD-10-CM | POA: Insufficient documentation

## 2021-07-21 MED ORDER — VALACYCLOVIR HCL 1 G PO TABS: 1000.0000 mg | ORAL_TABLET | Freq: Two times a day (BID) | ORAL | 0 refills | Status: AC

## 2021-07-21 NOTE — Progress Notes (Signed)
? ?Subjective:  ? Amy Hodge is a 66 y.o. female who presents for Medicare Annual (Subsequent) preventive examination. ? ?Review of Systems    ?General:   No F/C, wt loss ?Pulm:   No DIB, no worsening SOB from baseline, no pleuritic chest pain ?Card:  No CP, palpitations ?Abd:  No n/v/d or pain ?Ext:  No inc edema from baseline ?   ?Objective:  ?  ?Today's Vitals  ? 07/21/21 0840  ?BP: 111/72  ?Pulse: 86  ?Temp: 97.7 ?F (36.5 ?C)  ?SpO2: 93%  ?Weight: 192 lb (87.1 kg)  ?Height: 5' 6.5" (1.689 m)  ? ?Body mass index is 30.53 kg/m?. ? ? ?  08/23/2019  ?  2:18 PM 08/30/2016  ?  8:18 AM 08/02/2016  ?  8:48 AM 06/28/2016  ? 10:53 AM 05/10/2016  ?  1:21 PM 12/15/2015  ?  9:17 AM 02/24/2015  ?  6:42 AM  ?Advanced Directives  ?Does Patient Have a Medical Advance Directive? No No No No No No No  ?Would patient like information on creating a medical advance directive? No - Patient declined No - Patient declined   Yes (MAU/Ambulatory/Procedural Areas - Information given)  No - patient declined information  ? ? ?Current Medications (verified) ?Outpatient Encounter Medications as of 07/21/2021  ?Medication Sig  ? valACYclovir (VALTREX) 1000 MG tablet Take 1 tablet (1,000 mg total) by mouth 2 (two) times daily for 7 days.  ? acetaminophen (TYLENOL) 500 MG tablet Take 1,000 mg by mouth every 8 (eight) hours as needed (for pain or headaches).  ? albuterol (VENTOLIN HFA) 108 (90 Base) MCG/ACT inhaler INHALE 1-2 PUFFS INTO THE LUNGS EVERY 6 (SIX) HOURS AS NEEDED FOR WHEEZING OR SHORTNESS OF BREATH.  ? amitriptyline (ELAVIL) 50 MG tablet TAKE 1 TABLET AT BEDTIME  ? amLODipine (NORVASC) 5 MG tablet TAKE 1 TABLET EVERY DAY  ? aspirin EC 81 MG tablet Take 81 mg by mouth daily at 6 PM.   ? B Complex Vitamins (VITAMIN B COMPLEX PO) Take 1 tablet by mouth daily.  ? budesonide (PULMICORT) 0.25 MG/2ML nebulizer solution Take 2 mLs (0.25 mg total) by nebulization in the morning and at bedtime.  ? buPROPion (WELLBUTRIN XL) 150 MG 24 hr tablet  TAKE 1 TABLET EVERY DAY  ? chlorpheniramine-HYDROcodone (TUSSIONEX PENNKINETIC ER) 10-8 MG/5ML SUER Take 5 mLs by mouth every 12 (twelve) hours as needed for cough.  ? chlorpheniramine-HYDROcodone (TUSSIONEX PENNKINETIC ER) 10-8 MG/5ML 5 ml every 12 hours if needed for cough  ? Cholecalciferol (VITAMIN D3) 5000 units CAPS Take 5,000 Units by mouth daily.   ? cyclobenzaprine (FLEXERIL) 10 MG tablet Take 1 tablet (10 mg total) by mouth 3 (three) times daily as needed for muscle spasms.  ? DULoxetine (CYMBALTA) 20 MG capsule TAKE 2 CAPSULES EVERY DAY  ? fexofenadine (ALLEGRA) 180 MG tablet Take 1 tablet (180 mg total) by mouth daily.  ? hydrOXYzine (ATARAX/VISTARIL) 25 MG tablet 1-2 tabs by mouth every 6-8 hrs PRN anxiety  ? levothyroxine (SYNTHROID) 175 MCG tablet TAKE 1 TABLET EVERY DAY BEFORE BREAKFAST  ? losartan (COZAAR) 50 MG tablet Take 1 tablet (50 mg total) by mouth daily.  ? Multiple Vitamin (MULTIVITAMIN) tablet Take 1 tablet by mouth daily.  ? nitroGLYCERIN (NITROSTAT) 0.4 MG SL tablet Place 0.4 mg under the tongue every 5 (five) minutes as needed for chest pain.  ? omeprazole (PRILOSEC) 20 MG capsule TAKE 1 CAPSULE (20 MG TOTAL) BY MOUTH IN THE MORNING AND AT BEDTIME.  ?  spironolactone (ALDACTONE) 25 MG tablet TAKE 1 TABLET (25 MG TOTAL) BY MOUTH DAILY.  ? ?No facility-administered encounter medications on file as of 07/21/2021.  ? ? ?Allergies (verified) ?Bactrim [sulfamethoxazole-trimethoprim], Ketorolac, Anoro ellipta [umeclidinium-vilanterol], Morphine, Sulfamethoxazole, Tetracyclines & related, Trimethoprim, Valsartan, Bystolic [nebivolol hcl], Isosorbide, Levofloxacin, Lisinopril, Metoprolol, Morphine and related, Simvastatin, Singulair [montelukast sodium], Statins, Tetracycline, and Toradol [ketorolac tromethamine]  ? ?History: ?Past Medical History:  ?Diagnosis Date  ? Allergy   ? Anxiety   ? COPD (chronic obstructive pulmonary disease) ([place])   ? Coronary artery disease   ? a. s/p PCI of RCA in  2015 b. cath in 12/2015 showed 100% Ost RPDA with collaterals present  ? DDD (degenerative disc disease), cervical   ? Depression   ? DJD (degenerative joint disease) of knee   ? RT  ? GERD (gastroesophageal reflux disease)   ? Hashimoto's disease   ? Headache(784.0)   ? Hyperlipidemia   ? Hypertension   ? Hypothyroidism   ? Myocardial infarction [facility])   ? Nocturnal hypoxemia 02/26/2015  ? On supplemental oxygen therapy   ? Oxygen 2.5 l/m at bedtime.  ? Stented coronary artery 02/26/2015  ? ?Past Surgical History:  ?Procedure Laterality Date  ? ABDOMINAL HYSTERECTOMY    ? APPENDECTOMY  1971  ? BUNIONECTOMY Right   ? CARDIAC CATHETERIZATION N/A 09/02/2014  ? Procedure: Left Heart Cath and Coronary Angiography;  Surgeon: Adrian Prows, MD;  Location: [facility];  Service: Cardiovascular;  Laterality: N/A;  ? CARDIAC CATHETERIZATION N/A 12/15/2015  ? Procedure: Left Heart Cath and Coronary Angiography;  Surgeon: Jettie Booze, MD;  Location: [facility];  Service: Cardiovascular;  Laterality: N/A;  ? CERVICAL FUSION    ? CESAREAN SECTION    ? COLONOSCOPY WITH PROPOFOL N/A 02/24/2015  ? Procedure: COLONOSCOPY WITH PROPOFOL;  Surgeon: Garlan Fair, MD;  Location: [facility];  Service: Endoscopy;  Laterality: N/A;  ? ESOPHAGOGASTRODUODENOSCOPY (EGD) WITH PROPOFOL N/A 02/24/2015  ? Procedure: ESOPHAGOGASTRODUODENOSCOPY (EGD) WITH PROPOFOL;  Surgeon: Garlan Fair, MD;  Location: [facility];  Service: Endoscopy;  Laterality: N/A;  ? HAND SURGERY    ? due to trauma-dog bite.  ? I & D EXTREMITY Bilateral 05/17/2013  ? Procedure: IRRIGATION AND DEBRIDEMENT EXTREMITY;  Surgeon: Roseanne Kaufman, MD;  Location: [facility];  Service: Orthopedics;  Laterality: Bilateral;  ? LEFT HEART CATHETERIZATION WITH CORONARY ANGIOGRAM N/A 03/04/2014  ? Procedure: LEFT HEART CATHETERIZATION WITH CORONARY ANGIOGRAM;  Surgeon: Laverda Page, MD;  Location: [facility] C[facility];  Service: Cardiovascular;  Laterality: N/A;  ?  OOPHORECTOMY    ? SHOULDER SURGERY    ? RT  ? THYROIDECTOMY  1996  ? TONSILLECTOMY  1965  ? TOTAL ABDOMINAL HYSTERECTOMY W/ BILATERAL SALPINGOOPHORECTOMY  01/2008  ? with cervical dysplasia  ? TUBAL LIGATION  1984  ? ?Family History  ?Problem Relation Age of Onset  ? Lung cancer Mother   ? Hypertension Mother   ? Stroke Mother   ? Diabetes Mother   ? Diabetes Father   ? Hypertension Father   ? Heart attack Father   ? Asthma Father   ? COPD Father   ? Alcohol abuse Father   ? Diabetes Sister   ? Hypertension Sister   ? ?Social History  ? ?Socioeconomic History  ? Marital status: Widowed  ?  Spouse name: Not on file  ? Number of children: 1  ? Years of education: Not on file  ? Highest education level: Not on  file  ?Occupational History  ? Occupation: disabled  ?  Employer: [employer]  ?Tobacco Use  ? Smoking status: Former  ?  Packs/day: 2.00  ?  Years: 25.00  ?  Pack years: 50.00  ?  Types: Cigarettes  ?  Quit date: 04/23/1996  ?  Years since quitting: 25.2  ? Smokeless tobacco: Never  ?Vaping Use  ? Vaping Use: Never used  ?Substance and Sexual Activity  ? Alcohol use: No  ?  Alcohol/week: 0.0 standard drinks  ? Drug use: No  ? Sexual activity: Not Currently  ?Other Topics Concern  ? Not on file  ?Social History Narrative  ? On worker's comp for lower back and right shoulder - applying for disability; losing [employer] insurance 03/07/2010  ? ?Social Determinants of Health  ? ?Financial Resource Strain: Not on file  ?Food Insecurity: Not on file  ?Transportation Needs: Not on file  ?Physical Activity: Not on file  ?Stress: Not on file  ?Social Connections: Not on file  ? ? ?Tobacco Counseling ?Counseling given: Not Answered ? ? ?Diabetic? No ? ? ? ?Activities of Daily Living ? ?  07/21/2021  ?  8:42 AM 04/27/2021  ? 10:33 AM  ?In your present state of health, do you have any difficulty performing the following activities:  ?Hearing? 0 0  ?Vision? 0 0  ?Difficulty concentrating or making decisions? 0 0  ?Walking or climbing  stairs? 0 0  ?Dressing or bathing? 0 0  ?Doing errands, shopping? 0 0  ? ? ?Patient Care Team: ?Lorrene Reid, PA-C as PCP - General ?Lelon Perla, MD as PCP - Cardiology (Cardiology) ?Lelon Perla

## 2021-07-21 NOTE — Patient Instructions (Signed)

## 2021-08-02 ENCOUNTER — Other Ambulatory Visit: Payer: Self-pay | Admitting: Physician Assistant

## 2021-08-02 DIAGNOSIS — M791 Myalgia, unspecified site: Secondary | ICD-10-CM

## 2021-08-03 ENCOUNTER — Other Ambulatory Visit: Payer: Self-pay | Admitting: *Deleted

## 2021-08-03 ENCOUNTER — Other Ambulatory Visit: Payer: Self-pay | Admitting: Physician Assistant

## 2021-08-03 DIAGNOSIS — I712 Thoracic aortic aneurysm, without rupture, unspecified: Secondary | ICD-10-CM

## 2021-08-30 ENCOUNTER — Ambulatory Visit
Admission: RE | Admit: 2021-08-30 | Discharge: 2021-08-30 | Disposition: A | Discharge status: Home / Self Care | Payer: Medicare HMO | Source: Ambulatory Visit | Attending: Cardiology | Admitting: Cardiology

## 2021-08-30 DIAGNOSIS — I712 Thoracic aortic aneurysm, without rupture, unspecified: Secondary | ICD-10-CM

## 2021-08-30 DIAGNOSIS — I288 Other diseases of pulmonary vessels: Secondary | ICD-10-CM | POA: Diagnosis not present

## 2021-08-30 DIAGNOSIS — I2510 Atherosclerotic heart disease of native coronary artery without angina pectoris: Secondary | ICD-10-CM | POA: Diagnosis not present

## 2021-08-30 DIAGNOSIS — J432 Centrilobular emphysema: Secondary | ICD-10-CM | POA: Diagnosis not present

## 2021-08-30 MED ORDER — IOPAMIDOL (ISOVUE-370) INJECTION 76%
75.0000 mL | Freq: Once | INTRAVENOUS | Status: AC | PRN
  Administered 2021-08-30: 75 mL via INTRAVENOUS

## 2021-09-10 ENCOUNTER — Other Ambulatory Visit: Payer: Self-pay | Admitting: Internal Medicine

## 2021-09-12 MED ORDER — HYDROCOD POLI-CHLORPHE POLI ER 10-8 MG/5ML PO SUER: ORAL | 0 refills | Status: DC

## 2021-09-12 NOTE — Telephone Encounter (Signed)
Tussionex refilled 

## 2021-09-23 ENCOUNTER — Encounter: Payer: Self-pay | Admitting: Physician Assistant

## 2021-09-23 ENCOUNTER — Other Ambulatory Visit: Payer: Self-pay | Admitting: Physician Assistant

## 2021-09-23 DIAGNOSIS — L989 Disorder of the skin and subcutaneous tissue, unspecified: Secondary | ICD-10-CM

## 2021-09-23 DIAGNOSIS — F4323 Adjustment disorder with mixed anxiety and depressed mood: Secondary | ICD-10-CM

## 2021-09-23 DIAGNOSIS — I10 Essential (primary) hypertension: Secondary | ICD-10-CM

## 2021-09-23 DIAGNOSIS — G4700 Insomnia, unspecified: Secondary | ICD-10-CM

## 2021-09-23 DIAGNOSIS — K219 Gastro-esophageal reflux disease without esophagitis: Secondary | ICD-10-CM

## 2021-09-30 ENCOUNTER — Encounter: Payer: Self-pay | Admitting: Physician Assistant

## 2021-09-30 ENCOUNTER — Ambulatory Visit (INDEPENDENT_AMBULATORY_CARE_PROVIDER_SITE_OTHER): Payer: Medicare HMO | Admitting: Physician Assistant

## 2021-09-30 VITALS — BP 103/69 | HR 92 | Wt 191.2 lb

## 2021-09-30 DIAGNOSIS — B029 Zoster without complications: Secondary | ICD-10-CM

## 2021-09-30 MED ORDER — METHYLPREDNISOLONE 4 MG PO TBPK: ORAL_TABLET | ORAL | 0 refills | Status: DC

## 2021-09-30 MED ORDER — VALACYCLOVIR HCL 1 G PO TABS: 1000.0000 mg | ORAL_TABLET | Freq: Three times a day (TID) | ORAL | 0 refills | Status: AC

## 2021-09-30 NOTE — Progress Notes (Signed)
Established patient acute visit   Patient: Amy Hodge   DOB: June 07, 1955   66 y.o. Female  MRN: [id number] Visit Date: 09/30/2021  Chief Complaint  Patient presents with   Rash   Subjective    HPI  Patient presents for burning and itchy rash. States rash has become worse and tender. Does report headache and chills. States in the past had shingles and rash feels similar. Rash appeared 2-3 days ago.      Medications: Outpatient Medications Prior to Visit  Medication Sig   acetaminophen (TYLENOL) 500 MG tablet Take 1,000 mg by mouth every 8 (eight) hours as needed (for pain or headaches).   albuterol (VENTOLIN HFA) 108 (90 Base) MCG/ACT inhaler INHALE 1-2 PUFFS INTO THE LUNGS EVERY 6  HOURS AS NEEDED FOR WHEEZING OR SHORTNESS OF BREATH.   amitriptyline (ELAVIL) 50 MG tablet TAKE 1 TABLET AT BEDTIME   amLODipine (NORVASC) 5 MG tablet TAKE 1 TABLET EVERY DAY   aspirin EC 81 MG tablet Take 81 mg by mouth daily at 6 PM.    B Complex Vitamins (VITAMIN B COMPLEX PO) Take 1 tablet by mouth daily.   budesonide (PULMICORT) 0.25 MG/2ML nebulizer solution Take 2 mLs (0.25 mg total) by nebulization in the morning and at bedtime.   buPROPion (WELLBUTRIN XL) 150 MG 24 hr tablet TAKE 1 TABLET EVERY DAY   chlorpheniramine-HYDROcodone (TUSSIONEX PENNKINETIC ER) 10-8 MG/5ML SUER Take 5 mLs by mouth every 12 (twelve) hours as needed for cough.   chlorpheniramine-HYDROcodone (TUSSIONEX PENNKINETIC ER) 10-8 MG/5ML 5 ml every 12 hours if needed for cough   Cholecalciferol (VITAMIN D3) 5000 units CAPS Take 5,000 Units by mouth daily.    cyclobenzaprine (FLEXERIL) 10 MG tablet Take 1 tablet (10 mg total) by mouth 3 (three) times daily as needed for muscle spasms.   DULoxetine (CYMBALTA) 20 MG capsule TAKE 2 CAPSULES EVERY DAY   fexofenadine (ALLEGRA) 180 MG tablet Take 1 tablet (180 mg total) by mouth daily.   hydrOXYzine (ATARAX/VISTARIL) 25 MG tablet 1-2 tabs by mouth every 6-8 hrs PRN anxiety    levothyroxine (SYNTHROID) 175 MCG tablet TAKE 1 TABLET EVERY DAY BEFORE BREAKFAST   Multiple Vitamin (MULTIVITAMIN) tablet Take 1 tablet by mouth daily.   nitroGLYCERIN (NITROSTAT) 0.4 MG SL tablet Place 0.4 mg under the tongue every 5 (five) minutes as needed for chest pain.   omeprazole (PRILOSEC) 20 MG capsule TAKE 1 CAPSULE IN THE MORNING AND AT BEDTIME.   spironolactone (ALDACTONE) 25 MG tablet TAKE 1 TABLET EVERY DAY   losartan (COZAAR) 50 MG tablet Take 1 tablet (50 mg total) by mouth daily.   No facility-administered medications prior to visit.    Review of Systems Review of Systems:  A fourteen system review of systems was performed and found to be positive as per HPI.  Last CBC Lab Results  Component Value Date   WBC 6.3 03/23/2021   HGB 13.9 03/23/2021   HCT 42.0 03/23/2021   MCV 86 03/23/2021   MCH 28.6 03/23/2021   RDW 12.8 03/23/2021   PLT 299 12/87/8676   Last metabolic panel Lab Results  Component Value Date   GLUCOSE 74 04/30/2021   NA 138 04/30/2021   K 4.6 04/30/2021   CL 97 04/30/2021   CO2 28 04/30/2021   BUN 9 04/30/2021   CREATININE 0.85 04/30/2021   EGFR 77 04/30/2021   CALCIUM 9.5 04/30/2021   PROT 6.8 03/23/2021   ALBUMIN 4.6 03/23/2021   LABGLOB 2.2 03/23/2021  AGRATIO 2.1 03/23/2021   BILITOT 0.3 03/23/2021   ALKPHOS 101 03/23/2021   AST 21 03/23/2021   ALT 9 03/23/2021   ANIONGAP 10 08/23/2019   Last lipids Lab Results  Component Value Date   CHOL 192 07/17/2020   HDL 54 07/17/2020   LDLCALC 114 (H) 07/17/2020   LDLDIRECT 123 (H) 03/23/2021   TRIG 134 07/17/2020   CHOLHDL 3.6 07/17/2020   Last hemoglobin A1c Lab Results  Component Value Date   HGBA1C 5.4 07/17/2020   Last thyroid functions Lab Results  Component Value Date   TSH 1.190 05/04/2021   T3TOTAL 135 05/04/2021   Last vitamin D Lab Results  Component Value Date   VD25OH 76.7 01/25/2019     Objective    BP 103/69   Pulse 92   Wt 191 lb 3.2 oz (86.7 kg)    SpO2 91%   BMI 30.40 kg/m    Physical Exam  General:  Pleasant and cooperative appropriate for stated age.  Neuro:  Alert and oriented,  extra-ocular muscles intact  HEENT:  Normocephalic, atraumatic, neck supple  Skin:  2 crusted lesions and redness over left lower back/hip area, does not cross midline Respiratory: Speaking in full sentences, unlabored. Vascular:  Ext warm, no cyanosis apprec.; cap RF less 2 sec. Psych:  No HI/SI, judgement and insight good, Euthymic mood. Full Affect.   No results found for any visits on 09/30/21.  Assessment & Plan     Patient has s/sx suggestive of herpes zoster. Will start oral antiviral medication with Valtrex 1g TID x 7 days and steroid taper. Follow-up if symptoms fail to improve or worsen.   Return if symptoms worsen or fail to improve.        Lorrene Reid, PA-C  [facility] [phone] (phone) [phone] (fax)  [place]

## 2021-09-30 NOTE — Patient Instructions (Signed)

## 2021-10-04 ENCOUNTER — Other Ambulatory Visit: Payer: Self-pay | Admitting: Physician Assistant

## 2021-10-04 DIAGNOSIS — F4323 Adjustment disorder with mixed anxiety and depressed mood: Secondary | ICD-10-CM

## 2021-10-11 ENCOUNTER — Ambulatory Visit: Payer: Medicare HMO | Admitting: Internal Medicine

## 2021-10-19 ENCOUNTER — Other Ambulatory Visit: Payer: Self-pay | Admitting: Physician Assistant

## 2021-10-19 DIAGNOSIS — M62830 Muscle spasm of back: Secondary | ICD-10-CM

## 2021-10-26 ENCOUNTER — Telehealth: Payer: Self-pay | Admitting: Physician Assistant

## 2021-10-26 DIAGNOSIS — B029 Zoster without complications: Secondary | ICD-10-CM

## 2021-10-26 MED ORDER — VALACYCLOVIR HCL 1 G PO TABS: 1000.0000 mg | ORAL_TABLET | Freq: Three times a day (TID) | ORAL | 0 refills | Status: AC

## 2021-10-26 NOTE — Telephone Encounter (Signed)
Per Helyn App sending refill of Valtrex to pharmacy. Patient advised she can use Calamine lotion and Hydroxazine for itching. AS, CMA

## 2021-10-26 NOTE — Telephone Encounter (Signed)
Patient requesting you call her at [phone]. I asked if I could help her and she said no she would like to speak with you. Please advise.

## 2021-11-05 DIAGNOSIS — N390 Urinary tract infection, site not specified: Secondary | ICD-10-CM | POA: Diagnosis not present

## 2021-11-18 NOTE — Progress Notes (Unsigned)
HPI: FU CAD. Previously followed by Dr Einar Gip. Pt had NSTEMI 6/16 with complicated PCI of RCA (performed in [facility]). ABI 12/15 normal. Cath 6/16 showed 100 PDA, patent stents in proximal and mid RCA, no obstructive disease in the left system; collaterals to the PDA noted; PTCA of CTO unsuccessful. Last cath 10/17 showed 100 PDA (filled with left to right collaterals), patent RCA stent and normal LV function. H/O cough with ACEI. Myalgias with crestor. Dr Martinique reviewed and did not think attempt at CTO intervention or CABG was appropriate.  Neck CTA June 2021 showed no significant carotid stenosis. Echo 3/23 showed normal LV function; mild LVH; mild AI; mildly dilated ascending aorta (41 mm). CTA June 2023 showed 4.1 cm ascending thoracic aorta, dilated pulmonary artery.  Since last seen, she continues to have occasional chest pain.  It is substernal and can occur both with exertion and at rest.  Typically last 5 minutes and resolves.  It is unchanged.  She also continues to have dyspnea on exertion.  No orthopnea, PND or syncope.  Current Outpatient Medications  Medication Sig Dispense Refill   acetaminophen (TYLENOL) 500 MG tablet Take 1,000 mg by mouth every 8 (eight) hours as needed (for pain or headaches).     albuterol (VENTOLIN HFA) 108 (90 Base) MCG/ACT inhaler INHALE 1-2 PUFFS INTO THE LUNGS EVERY 6  HOURS AS NEEDED FOR WHEEZING OR SHORTNESS OF BREATH. 1 each 1   amitriptyline (ELAVIL) 50 MG tablet TAKE 1 TABLET AT BEDTIME 90 tablet 1   amLODipine (NORVASC) 5 MG tablet TAKE 1 TABLET EVERY DAY 90 tablet 3   aspirin EC 81 MG tablet Take 81 mg by mouth daily at 6 PM.      B Complex Vitamins (VITAMIN B COMPLEX PO) Take 1 tablet by mouth daily.     budesonide (PULMICORT) 0.25 MG/2ML nebulizer solution Take 2 mLs (0.25 mg total) by nebulization in the morning and at bedtime. 60 mL 3   buPROPion (WELLBUTRIN XL) 150 MG 24 hr tablet TAKE 1 TABLET EVERY DAY 90 tablet 0   chlorpheniramine-HYDROcodone  (TUSSIONEX PENNKINETIC ER) 10-8 MG/5ML SUER Take 5 mLs by mouth every 12 (twelve) hours as needed for cough. 140 mL 0   chlorpheniramine-HYDROcodone (TUSSIONEX PENNKINETIC ER) 10-8 MG/5ML 5 ml every 12 hours if needed for cough 115 mL 0   Cholecalciferol (VITAMIN D3) 5000 units CAPS Take 5,000 Units by mouth daily.      cyclobenzaprine (FLEXERIL) 10 MG tablet TAKE 1 TABLET THREE TIMES DAILY AS NEEDED FOR MUSCLE SPASM(S) 30 tablet 1   DULoxetine (CYMBALTA) 20 MG capsule TAKE 2 CAPSULES EVERY DAY 180 capsule 0   fexofenadine (ALLEGRA) 180 MG tablet Take 1 tablet (180 mg total) by mouth daily. 90 tablet 3   hydrOXYzine (ATARAX) 25 MG tablet TAKE 1 TO 2 TABLETS BY MOUTH EVERY 6 TO 8 HOURS AS NEEDED FOR ANXIETY 60 tablet 0   levothyroxine (SYNTHROID) 175 MCG tablet TAKE 1 TABLET EVERY DAY BEFORE BREAKFAST 90 tablet 1   losartan (COZAAR) 50 MG tablet Take 1 tablet (50 mg total) by mouth daily. 90 tablet 3   methylPREDNISolone (MEDROL DOSEPAK) 4 MG TBPK tablet Take as directed on package. 21 tablet 0   Multiple Vitamin (MULTIVITAMIN) tablet Take 1 tablet by mouth daily.     nitroGLYCERIN (NITROSTAT) 0.4 MG SL tablet Place 0.4 mg under the tongue every 5 (five) minutes as needed for chest pain.     omeprazole (PRILOSEC) 20 MG capsule TAKE  1 CAPSULE IN THE MORNING AND AT BEDTIME. 180 capsule 1   spironolactone (ALDACTONE) 25 MG tablet TAKE 1 TABLET EVERY DAY 90 tablet 1   No current facility-administered medications for this visit.     Past Medical History:  Diagnosis Date   Allergy    Anxiety    COPD (chronic obstructive pulmonary disease) ([place])    Coronary artery disease    a. s/p PCI of RCA in 2015 b. cath in 12/2015 showed 100% Ost RPDA with collaterals present   DDD (degenerative disc disease), cervical    Depression    DJD (degenerative joint disease) of knee    RT   GERD (gastroesophageal reflux disease)    Hashimoto's disease    Headache(784.0)    Hyperlipidemia    Hypertension     Hypothyroidism    Myocardial infarction ([place])    Nocturnal hypoxemia 02/26/2015   On supplemental oxygen therapy    Oxygen 2.5 l/m at bedtime.   Stented coronary artery 02/26/2015    Past Surgical History:  Procedure Laterality Date   ABDOMINAL HYSTERECTOMY     APPENDECTOMY  1971   BUNIONECTOMY Right    CARDIAC CATHETERIZATION N/A 09/02/2014   Procedure: Left Heart Cath and Coronary Angiography;  Surgeon: Adrian Prows, MD;  Location: [place] [facility];  Service: Cardiovascular;  Laterality: N/A;   CARDIAC CATHETERIZATION N/A 12/15/2015   Procedure: Left Heart Cath and Coronary Angiography;  Surgeon: Jettie Booze, MD;  Location: [place] [facility];  Service: Cardiovascular;  Laterality: N/A;   CERVICAL FUSION     CESAREAN SECTION     COLONOSCOPY WITH PROPOFOL N/A 02/24/2015   Procedure: COLONOSCOPY WITH PROPOFOL;  Surgeon: Garlan Fair, MD;  [facility];  Service: Endoscopy;  Laterality: N/A;   ESOPHAGOGASTRODUODENOSCOPY (EGD) WITH PROPOFOL N/A 02/24/2015   Procedure: ESOPHAGOGASTRODUODENOSCOPY (EGD) WITH PROPOFOL;  Surgeon: Garlan Fair, MD;  [facility];  Service: Endoscopy;  Laterality: N/A;   HAND SURGERY     due to trauma-dog bite.   I & D EXTREMITY Bilateral 05/17/2013   Procedure: IRRIGATION AND DEBRIDEMENT EXTREMITY;  Surgeon: Roseanne Kaufman, MD;  Location: [place];  Service: Orthopedics;  Laterality: Bilateral;   LEFT HEART CATHETERIZATION WITH CORONARY ANGIOGRAM N/A 03/04/2014   Procedure: LEFT HEART CATHETERIZATION WITH CORONARY ANGIOGRAM;  Surgeon: Laverda Page, MD;  Location[facility] C[facility] LAB;  Service: Cardiovascular;  Laterality: N/A;   OOPHORECTOMY     SHOULDER SURGERY     RT   [place]   TOTAL ABDOMINAL HYSTERECTOMY W/ BILATERAL SALPINGOOPHORECTOMY  01/2008   with cervical dysplasia   TUBAL LIGATION  1984    Social History   Socioeconomic History   Marital status: Widowed    Spouse name: Not on  file   Number of children: 1   Years of education: Not on file   Highest education level: Not on file  Occupational History   Occupation: disabled    Employer: [employer]  Tobacco Use   Smoking status: Former    Packs/day: 2.00    Years: 25.00    Total pack years: 50.00    Types: Cigarettes    Quit date: 04/23/1996    Years since quitting: 25.5   Smokeless tobacco: Never  Vaping Use   Vaping Use: Never used  Substance and Sexual Activity   Alcohol use: No    Alcohol/week: 0.0 standard drinks of alcohol   Drug use: No   Sexual activity:  Not Currently  Other Topics Concern   Not on file  Social History Narrative   On worker's comp for lower back and right shoulder - applying for disability; losing [employer] insurance 03/07/2010   Social Determinants of Health   Financial Resource Strain: Not on file  Food Insecurity: Not on file  Transportation Needs: Not on file  Physical Activity: Not on file  Stress: Not on file  Social Connections: Not on file  Intimate Partner Violence: Not on file    Family History  Problem Relation Age of Onset   Lung cancer Mother    Hypertension Mother    Stroke Mother    Diabetes Mother    Diabetes Father    Hypertension Father    Heart attack Father    Asthma Father    COPD Father    Alcohol abuse Father    Diabetes Sister    Hypertension Sister     ROS: no fevers or chills, productive cough, hemoptysis, dysphasia, odynophagia, melena, hematochezia, dysuria, hematuria, rash, seizure activity, orthopnea, PND, pedal edema, claudication. Remaining systems are negative.  Physical Exam: Well-developed well-nourished in no acute distress.  Skin is warm and dry.  HEENT is normal.  Neck is supple.  Chest is clear to auscultation with normal expansion.  Cardiovascular exam is regular rate and rhythm.  Abdominal exam nontender or distended. No masses palpated. Extremities show no edema. neuro grossly intact  ECG-normal sinus rhythm at a  rate of 80, normal axis, nonspecific ST changes.  Personally reviewed  A/P  1 chest pain-patient has chronic CP unchanged compared to previous.  Continue ASA. She is intolerant to statins.  She also did not tolerate beta-blockers, nitrates or Ranexa previously.  I will arrange a PET scan to screen for ischemia.   2 dyspnea-she continues to have dyspnea on exertion.  FU echo showed normal LV function.  We will arrange PET scan to exclude ischemia.   3 dilated ascending aorta-we will plan follow-up CTA June 2024.   4 coronary artery disease-continue aspirin; intolerant to statins.   5 hyperlipidemia-most recent LDL 123.  She is statin intolerant.  She did not tolerate Zetia due to muscle aches by her report. Repatha was discontinued as she states it was causing a cough.  She does not want to try any additional lipid lowering medications.   6 hypertension-blood pressure controlled.  Continue present medications.  Note she has multiple medication intolerances.  She has worsening pedal edema with higher doses of amlodipine.   Kirk Ruths, MD

## 2021-11-19 ENCOUNTER — Ambulatory Visit: Payer: Medicare HMO | Attending: Cardiology | Admitting: Cardiology

## 2021-11-19 ENCOUNTER — Encounter: Payer: Self-pay | Admitting: Cardiology

## 2021-11-19 VITALS — BP 128/84 | HR 80 | Ht 66.5 in | Wt 193.4 lb

## 2021-11-19 DIAGNOSIS — R072 Precordial pain: Secondary | ICD-10-CM | POA: Diagnosis not present

## 2021-11-19 DIAGNOSIS — I712 Thoracic aortic aneurysm, without rupture, unspecified: Secondary | ICD-10-CM

## 2021-11-19 DIAGNOSIS — E7800 Pure hypercholesterolemia, unspecified: Secondary | ICD-10-CM | POA: Diagnosis not present

## 2021-11-19 DIAGNOSIS — I10 Essential (primary) hypertension: Secondary | ICD-10-CM | POA: Diagnosis not present

## 2021-11-19 DIAGNOSIS — I2510 Atherosclerotic heart disease of native coronary artery without angina pectoris: Secondary | ICD-10-CM | POA: Diagnosis not present

## 2021-11-19 NOTE — Patient Instructions (Signed)
  Testing/Procedures:  How to Prepare for Your Cardiac PET/CT Stress Test:  1. Please do not take these medications before your test: take all medications  2. Nothing to eat or drink, except water, 3 hours prior to arrival time.   NO caffeine/decaffeinated products, or chocolate 12 hours prior to arrival.  3. NO perfume, cologne or lotion  4. Total time is 1 to 2 hours; you may want to bring reading material for the waiting time.  5. Please report to Admitting at the [facility] Entr[facility] 60 minutes early for your test.  [place], [place] [address]    In preparation for your appointment, medication and supplies will be purchased.  Appointment availability is limited, so if you need to cancel or reschedule, please call the Radiology Department at [phone]  24 hours in advance to avoid a cancellation fee of $100.00  What to Expect After you Arrive:  Once you arrive and check in for your appointment, you will be taken to a preparation room within the Radiology Department.  A technologist or Nurse will obtain your medical history, verify that you are correctly prepped for the exam, and explain the procedure.  Afterwards,  an IV will be started in your arm and electrodes will be placed on your skin for EKG monitoring during the stress portion of the exam. Then you will be escorted to the PET/CT scanner.  There, staff will get you positioned on the scanner and obtain a blood pressure and EKG.  During the exam, you will continue to be connected to the EKG and blood pressure machines.  A small, safe amount of a radioactive tracer will be injected in your IV to obtain a series of pictures of your heart along with an injection of a stress agent.    After your Exam:  It is recommended that you eat a meal and drink a caffeinated beverage to counter act any effects of the stress agent.  Drink plenty of fluids for the remainder of the day and urinate frequently for the  first couple of hours after the exam.  Your doctor will inform you of your test results within 7-10 business days.  For questions about your test or how to prepare for your test, please call: Marchia Bond, Cardiac Imaging Nurse Navigator  Gordy Clement, Cardiac Imaging Nurse Navigator Office: [phone]    Follow-Up: At [facility], you and your health needs are our priority.  As part of our continuing mission to provide you with exceptional heart care, we have created designated Provider Care Teams.  These Care Teams include your primary Cardiologist (physician) and Advanced Practice Providers (APPs -  Physician Assistants and Nurse Practitioners) who all work together to provide you with the care you need, when you need it.  We recommend signing up for the patient portal called "MyChart".  Sign up information is provided on this After Visit Summary.  MyChart is used to connect with patients for Virtual Visits (Telemedicine).  Patients are able to view lab/test results, encounter notes, upcoming appointments, etc.  Non-urgent messages can be sent to your provider as well.   To learn more about what you can do with MyChart, go to [email or web address].    Your next appointment:   6 month(s)  The format for your next appointment:   In Person  Provider:   Kirk Ruths, MD

## 2021-12-01 ENCOUNTER — Other Ambulatory Visit: Payer: Self-pay | Admitting: Physician Assistant

## 2021-12-05 ENCOUNTER — Other Ambulatory Visit: Payer: Self-pay | Admitting: Physician Assistant

## 2021-12-05 DIAGNOSIS — F4323 Adjustment disorder with mixed anxiety and depressed mood: Secondary | ICD-10-CM

## 2021-12-05 DIAGNOSIS — E890 Postprocedural hypothyroidism: Secondary | ICD-10-CM

## 2021-12-10 ENCOUNTER — Encounter: Payer: Self-pay | Admitting: Physician Assistant

## 2021-12-10 ENCOUNTER — Ambulatory Visit (INDEPENDENT_AMBULATORY_CARE_PROVIDER_SITE_OTHER): Payer: Medicare HMO | Admitting: Physician Assistant

## 2021-12-10 VITALS — BP 126/85 | HR 82 | Temp 97.7°F | Wt 191.0 lb

## 2021-12-10 DIAGNOSIS — Z23 Encounter for immunization: Secondary | ICD-10-CM

## 2021-12-10 NOTE — Progress Notes (Signed)
Patient in clinic for flu injection. Patient tolerated injection well.

## 2021-12-14 DIAGNOSIS — Z1211 Encounter for screening for malignant neoplasm of colon: Secondary | ICD-10-CM

## 2021-12-21 ENCOUNTER — Other Ambulatory Visit: Payer: Self-pay | Admitting: Physician Assistant

## 2021-12-21 DIAGNOSIS — Z1231 Encounter for screening mammogram for malignant neoplasm of breast: Secondary | ICD-10-CM

## 2021-12-27 ENCOUNTER — Other Ambulatory Visit: Payer: Self-pay | Admitting: Physician Assistant

## 2021-12-27 DIAGNOSIS — M791 Myalgia, unspecified site: Secondary | ICD-10-CM

## 2022-01-07 ENCOUNTER — Other Ambulatory Visit: Payer: Self-pay | Admitting: *Deleted

## 2022-01-07 ENCOUNTER — Telehealth (HOSPITAL_COMMUNITY): Payer: Self-pay | Admitting: *Deleted

## 2022-01-07 ENCOUNTER — Other Ambulatory Visit: Payer: Self-pay | Admitting: Internal Medicine

## 2022-01-07 DIAGNOSIS — R072 Precordial pain: Secondary | ICD-10-CM

## 2022-01-07 MED ORDER — HYDROCOD POLI-CHLORPHE POLI ER 10-8 MG/5ML PO SUER: ORAL | 0 refills | Status: DC

## 2022-01-07 NOTE — Telephone Encounter (Signed)
Tussionex refilled

## 2022-01-07 NOTE — Telephone Encounter (Signed)
Reaching out to patient to offer assistance regarding upcoming cardiac imaging study; pt verbalizes understanding of appt date/time, parking situation and where to check in, pre-test NPO status, and verified current allergies; name and call back number provided for further questions should they arise  Domnic Vantol RN Navigator Cardiac Imaging [facility] [phone] office [phone] cell  Patient aware to avoid caffeine 12 hours prior to her cardiac PET scan. 

## 2022-01-07 NOTE — Telephone Encounter (Signed)
Please advise on refill request.  Allergies  Allergen Reactions   Bactrim [Sulfamethoxazole-Trimethoprim] Shortness Of Breath, Anxiety and Palpitations   Ketorolac Shortness Of Breath   Anoro Ellipta [Umeclidinium-Vilanterol] Other (See Comments)    Per patient, caused painful blisters in her mouth.    Morphine Other (See Comments)    Headache    Sulfamethoxazole    Tetracyclines & Related Itching   Trimethoprim    Valsartan Other (See Comments) and Cough    Headaches also    Bystolic [Nebivolol Hcl] Other (See Comments)    Fatigue- Tired feeling   Isosorbide Other (See Comments)    HEADACHES    Levofloxacin Hives   Lisinopril Cough   Metoprolol Other (See Comments)    FATIGUE   Morphine And Related Other (See Comments)    Headache    Simvastatin Other (See Comments)    Makes legs hurt/ sore    Singulair [Montelukast Sodium] Palpitations and Cough   Statins Other (See Comments)    MYALGIAS   Tetracycline Hives and Itching   Toradol [Ketorolac Tromethamine] Palpitations     Current Outpatient Medications:    acetaminophen (TYLENOL) 500 MG tablet, Take 1,000 mg by mouth every 8 (eight) hours as needed (for pain or headaches)., Disp: , Rfl:    albuterol (VENTOLIN HFA) 108 (90 Base) MCG/ACT inhaler, INHALE 1 TO 2 PUFFS INTO THE LUNGS EVERY 6 HOURS AS NEEDED FOR WHEEZING OR SHORTNESS OF BREATH., Disp: 1 each, Rfl: 1   amitriptyline (ELAVIL) 50 MG tablet, TAKE 1 TABLET AT BEDTIME, Disp: 90 tablet, Rfl: 1   amLODipine (NORVASC) 5 MG tablet, TAKE 1 TABLET EVERY DAY, Disp: 90 tablet, Rfl: 3   aspirin EC 81 MG tablet, Take 81 mg by mouth daily at 6 PM. , Disp: , Rfl:    B Complex Vitamins (VITAMIN B COMPLEX PO), Take 1 tablet by mouth daily., Disp: , Rfl:    budesonide (PULMICORT) 0.25 MG/2ML nebulizer solution, Take 2 mLs (0.25 mg total) by nebulization in the morning and at bedtime., Disp: 60 mL, Rfl: 3   buPROPion (WELLBUTRIN XL) 150 MG 24 hr tablet, TAKE 1 TABLET EVERY DAY,  Disp: 90 tablet, Rfl: 0   chlorpheniramine-HYDROcodone (TUSSIONEX PENNKINETIC ER) 10-8 MG/5ML SUER, Take 5 mLs by mouth every 12 (twelve) hours as needed for cough., Disp: 140 mL, Rfl: 0   chlorpheniramine-HYDROcodone (TUSSIONEX PENNKINETIC ER) 10-8 MG/5ML, 5 ml every 12 hours if needed for cough, Disp: 115 mL, Rfl: 0   Cholecalciferol (VITAMIN D3) 5000 units CAPS, Take 5,000 Units by mouth daily. , Disp: , Rfl:    cyclobenzaprine (FLEXERIL) 10 MG tablet, TAKE 1 TABLET THREE TIMES DAILY AS NEEDED FOR MUSCLE SPASM(S), Disp: 30 tablet, Rfl: 1   DULoxetine (CYMBALTA) 20 MG capsule, TAKE 2 CAPSULES EVERY DAY, Disp: 180 capsule, Rfl: 10   fexofenadine (ALLEGRA) 180 MG tablet, Take 1 tablet (180 mg total) by mouth daily., Disp: 90 tablet, Rfl: 3   hydrOXYzine (ATARAX) 25 MG tablet, TAKE 1 TO 2 TABLETS BY MOUTH EVERY 6 TO 8 HOURS AS NEEDED FOR ANXIETY, Disp: 60 tablet, Rfl: 0   levothyroxine (SYNTHROID) 175 MCG tablet, TAKE 1 TABLET EVERY DAY BEFORE BREAKFAST, Disp: 90 tablet, Rfl: 1   losartan (COZAAR) 50 MG tablet, Take 1 tablet (50 mg total) by mouth daily., Disp: 90 tablet, Rfl: 3   methylPREDNISolone (MEDROL DOSEPAK) 4 MG TBPK tablet, Take as directed on package., Disp: 21 tablet, Rfl: 0   Multiple Vitamin (MULTIVITAMIN) tablet, Take 1 tablet  by mouth daily., Disp: , Rfl:    nitroGLYCERIN (NITROSTAT) 0.4 MG SL tablet, Place 0.4 mg under the tongue every 5 (five) minutes as needed for chest pain., Disp: , Rfl:    omeprazole (PRILOSEC) 20 MG capsule, TAKE 1 CAPSULE IN THE MORNING AND AT BEDTIME., Disp: 180 capsule, Rfl: 1   spironolactone (ALDACTONE) 25 MG tablet, TAKE 1 TABLET EVERY DAY, Disp: 90 tablet, Rfl: 1

## 2022-01-11 ENCOUNTER — Encounter (HOSPITAL_COMMUNITY)
Admission: RE | Admit: 2022-01-11 | Discharge: 2022-01-11 | Disposition: A | Discharge status: Home / Self Care | Payer: Medicare HMO | Source: Ambulatory Visit | Attending: Cardiology | Admitting: Cardiology

## 2022-01-11 DIAGNOSIS — R072 Precordial pain: Secondary | ICD-10-CM | POA: Diagnosis not present

## 2022-01-11 LAB — NM PET CT CARDIAC PERFUSION MULTI W/ABSOLUTE BLOODFLOW
LV dias vol: 134 mL (ref 46–106)
LV sys vol: 65 mL
MBFR: 1.92
Nuc Rest EF: 51 %
Nuc Stress EF: 55 %
Peak HR: 93 {beats}/min
Rest HR: 80 {beats}/min
Rest MBF: 1.17 ml/g/min
Rest Nuclear Isotope Dose: 22.5 mCi
Stress MBF: 2.25 ml/g/min
Stress Nuclear Isotope Dose: 22.5 mCi
TID: 1.12

## 2022-01-11 MED ORDER — RUBIDIUM RB82 GENERATOR (RUBYFILL)
22.4000 | PACK | Freq: Once | INTRAVENOUS | Status: AC
  Administered 2022-01-11: 22.4 via INTRAVENOUS

## 2022-01-11 MED ORDER — REGADENOSON 0.4 MG/5ML IV SOLN: 0.4000 mg | Freq: Once | INTRAVENOUS | Status: AC

## 2022-01-11 MED ORDER — REGADENOSON 0.4 MG/5ML IV SOLN
INTRAVENOUS | Status: AC
  Administered 2022-01-11: 0.4 mg via INTRAVENOUS
  Filled 2022-01-11: qty 5

## 2022-01-11 MED ORDER — RUBIDIUM RB82 GENERATOR (RUBYFILL)
22.5000 | PACK | Freq: Once | INTRAVENOUS | Status: AC
  Administered 2022-01-11: 22.5 via INTRAVENOUS

## 2022-01-11 NOTE — Progress Notes (Signed)
Patient presents for a cardiac PET stress test and tolerated procedure without incident. Patient maintained acceptable vital signs throughout the test and was offered caffeine after test.  Patient ambulated out of department with a steady gait.  

## 2022-01-18 ENCOUNTER — Other Ambulatory Visit: Payer: Self-pay | Admitting: Cardiology

## 2022-01-18 DIAGNOSIS — I10 Essential (primary) hypertension: Secondary | ICD-10-CM

## 2022-01-21 ENCOUNTER — Ambulatory Visit (INDEPENDENT_AMBULATORY_CARE_PROVIDER_SITE_OTHER): Payer: Medicare HMO | Admitting: Physician Assistant

## 2022-01-21 ENCOUNTER — Encounter: Payer: Self-pay | Admitting: Physician Assistant

## 2022-01-21 VITALS — BP 117/81 | HR 78 | Resp 18 | Ht 66.5 in | Wt 194.0 lb

## 2022-01-21 DIAGNOSIS — F4323 Adjustment disorder with mixed anxiety and depressed mood: Secondary | ICD-10-CM

## 2022-01-21 DIAGNOSIS — E7800 Pure hypercholesterolemia, unspecified: Secondary | ICD-10-CM

## 2022-01-21 DIAGNOSIS — G4700 Insomnia, unspecified: Secondary | ICD-10-CM | POA: Diagnosis not present

## 2022-01-21 DIAGNOSIS — I10 Essential (primary) hypertension: Secondary | ICD-10-CM | POA: Diagnosis not present

## 2022-01-21 NOTE — Assessment & Plan Note (Addendum)
-  Followed by cardiology. Intolerance to several medications. Last direct LDL 123. Recommend to follow a heart healthy diet low in fat. Recommend repeating lipid panel at follow-up visit.

## 2022-01-21 NOTE — Assessment & Plan Note (Signed)
-  Controlled. Continue spironolactone 25 mg daily, losartan 50 mg daily and amlodipine 5 mg daily. Followed by cardiology.

## 2022-01-21 NOTE — Assessment & Plan Note (Addendum)
-  Stable. Continue duloxetine 20 mg daily and hydroxyzine 25-50 mg as needed for severe anxiety.

## 2022-01-21 NOTE — Progress Notes (Signed)
Established patient visit   Patient: Amy Hodge   DOB: May 25, 1955   66 y.o. Female  MRN: [id number] Visit Date: 01/21/2022  Chief Complaint  Patient presents with   Follow-up    Non Fasting   Hypertension   Hypothyroidism   Subjective    HPI HPI     Follow-up    Additional comments: Non Fasting      Last edited by Gemma Payor, CMA on 01/21/2022  8:32 AM.      Patient presents for chronic follow-up visit. Patient has no acute concerns.  Mood: Patient reports her mood has been stable. Taking duloxetine 20 mg daily and when feeling more anxious or overwhelmed will take hydroxyzine 25 mg  which helps.   HTN: Pt denies new chest pain, palpitations, dizziness, headache or lower extremity swelling. Taking medication as directed without side effects.   HLD: Pt has a history of statin intolerance, did not tolerate Zetia or Repatha. Trying to manage with diet.     01/21/2022    8:34 AM 07/21/2021    8:42 AM 04/27/2021   10:33 AM 03/23/2021    9:12 AM 12/29/2020    1:56 PM  Depression screen PHQ 2/9  Decreased Interest 0 0 0 0 0  Down, Depressed, Hopeless 0 0 0 0 0  PHQ - 2 Score 0 0 0 0 0  Altered sleeping 0 0 0 0 0  Tired, decreased energy 0 0 0 0 0  Change in appetite 0 0 0 0 0  Feeling bad or failure about yourself  0 0 0 0 0  Trouble concentrating 0 0 0 0 0  Moving slowly or fidgety/restless 0 0 0 0 0  Suicidal thoughts 0 0 0 0 0  PHQ-9 Score 0 0 0 0 0  Difficult doing work/chores Not difficult at all Not difficult at all Not difficult at all Not difficult at all       01/21/2022    8:34 AM 07/21/2021    8:43 AM 04/27/2021   10:33 AM 03/23/2021    9:12 AM  GAD 7 : Generalized Anxiety Score  Nervous, Anxious, on Edge 0 0 0 0  Control/stop worrying 0 0 0 0  Worry too much - different things 0 0 0 0  Trouble relaxing 0 0 0 0  Restless 0 0 0 0  Easily annoyed or irritable 0 0 0 0  Afraid - awful might happen 0 0 0 0  Total GAD 7 Score 0 0 0 0  Anxiety  Difficulty Not difficult at all Not difficult at all Not difficult at all Not difficult at all        Medications: Outpatient Medications Prior to Visit  Medication Sig   acetaminophen (TYLENOL) 500 MG tablet Take 1,000 mg by mouth every 8 (eight) hours as needed (for pain or headaches).   albuterol (VENTOLIN HFA) 108 (90 Base) MCG/ACT inhaler INHALE 1 TO 2 PUFFS INTO THE LUNGS EVERY 6 HOURS AS NEEDED FOR WHEEZING OR SHORTNESS OF BREATH.   amitriptyline (ELAVIL) 50 MG tablet TAKE 1 TABLET AT BEDTIME   amLODipine (NORVASC) 5 MG tablet TAKE 1 TABLET EVERY DAY   aspirin EC 81 MG tablet Take 81 mg by mouth daily at 6 PM.    B Complex Vitamins (VITAMIN B COMPLEX PO) Take 1 tablet by mouth daily.   budesonide (PULMICORT) 0.25 MG/2ML nebulizer solution Take 2 mLs (0.25 mg total) by nebulization in the morning and at bedtime.   buPROPion (  WELLBUTRIN XL) 150 MG 24 hr tablet TAKE 1 TABLET EVERY DAY   chlorpheniramine-HYDROcodone (TUSSIONEX PENNKINETIC ER) 10-8 MG/5ML SUER Take 5 mLs by mouth every 12 (twelve) hours as needed for cough.   chlorpheniramine-HYDROcodone (TUSSIONEX) 10-8 MG/5ML 5 ml every 12 hours if needed for cough   Cholecalciferol (VITAMIN D3) 5000 units CAPS Take 5,000 Units by mouth daily.    cyclobenzaprine (FLEXERIL) 10 MG tablet TAKE 1 TABLET THREE TIMES DAILY AS NEEDED FOR MUSCLE SPASM(S)   DULoxetine (CYMBALTA) 20 MG capsule TAKE 2 CAPSULES EVERY DAY   fexofenadine (ALLEGRA) 180 MG tablet Take 1 tablet (180 mg total) by mouth daily.   hydrOXYzine (ATARAX) 25 MG tablet TAKE 1 TO 2 TABLETS BY MOUTH EVERY 6 TO 8 HOURS AS NEEDED FOR ANXIETY   levothyroxine (SYNTHROID) 175 MCG tablet TAKE 1 TABLET EVERY DAY BEFORE BREAKFAST   losartan (COZAAR) 50 MG tablet TAKE 1 TABLET EVERY DAY   Multiple Vitamin (MULTIVITAMIN) tablet Take 1 tablet by mouth daily.   nitroGLYCERIN (NITROSTAT) 0.4 MG SL tablet Place 0.4 mg under the tongue every 5 (five) minutes as needed for chest pain.    omeprazole (PRILOSEC) 20 MG capsule TAKE 1 CAPSULE IN THE MORNING AND AT BEDTIME.   spironolactone (ALDACTONE) 25 MG tablet TAKE 1 TABLET EVERY DAY   [DISCONTINUED] methylPREDNISolone (MEDROL DOSEPAK) 4 MG TBPK tablet Take as directed on package.   No facility-administered medications prior to visit.    Review of Systems Review of Systems:  A fourteen system review of systems was performed and found to be positive as per HPI.  Last CBC Lab Results  Component Value Date   WBC 6.3 03/23/2021   HGB 13.9 03/23/2021   HCT 42.0 03/23/2021   MCV 86 03/23/2021   MCH 28.6 03/23/2021   RDW 12.8 03/23/2021   PLT 299 79/04/4095   Last metabolic panel Lab Results  Component Value Date   GLUCOSE 74 04/30/2021   NA 138 04/30/2021   K 4.6 04/30/2021   CL 97 04/30/2021   CO2 28 04/30/2021   BUN 9 04/30/2021   CREATININE 0.85 04/30/2021   EGFR 77 04/30/2021   CALCIUM 9.5 04/30/2021   PROT 6.8 03/23/2021   ALBUMIN 4.6 03/23/2021   LABGLOB 2.2 03/23/2021   AGRATIO 2.1 03/23/2021   BILITOT 0.3 03/23/2021   ALKPHOS 101 03/23/2021   AST 21 03/23/2021   ALT 9 03/23/2021   ANIONGAP 10 08/23/2019   Last lipids Lab Results  Component Value Date   CHOL 192 07/17/2020   HDL 54 07/17/2020   LDLCALC 114 (H) 07/17/2020   LDLDIRECT 123 (H) 03/23/2021   TRIG 134 07/17/2020   CHOLHDL 3.6 07/17/2020   Last hemoglobin A1c Lab Results  Component Value Date   HGBA1C 5.4 07/17/2020   Last thyroid functions Lab Results  Component Value Date   TSH 1.190 05/04/2021   T3TOTAL 135 05/04/2021   Last vitamin D Lab Results  Component Value Date   VD25OH 76.7 01/25/2019       Objective    BP 117/81 (BP Location: Left Arm, Patient Position: Sitting, Cuff Size: Large)   Pulse 78   Resp 18   Ht 5' 6.5" (1.689 m)   Wt 194 lb (88 kg)   SpO2 95%   BMI 30.84 kg/m  BP Readings from Last 3 Encounters:  01/21/22 117/81  01/11/22 137/75  12/10/21 126/85   Wt Readings from Last 3 Encounters:   01/21/22 194 lb (88 kg)  12/10/21 191 lb (  86.6 kg)  11/19/21 193 lb 6.4 oz (87.7 kg)    Physical Exam  General:  Well Developed, well nourished, appropriate for stated age.  Neuro:  Alert and oriented,  extra-ocular muscles intact  HEENT:  Normocephalic, atraumatic, neck supple  Skin:  no gross rash, warm, pink. Cardiac:  RRR, S1 S2 Respiratory: +slight wheezing, no rhonchi or crackles. Vascular:  Ext warm, no cyanosis apprec.; cap RF less 2 sec. Psych:  No HI/SI, judgement and insight good, Euthymic mood. Full Affect.   No results found for any visits on 01/21/22.  Assessment & Plan      Problem List Items Addressed This Visit       Cardiovascular and Mediastinum   HTN, goal below 130/80 - Primary    -Controlled. Continue spironolactone 25 mg daily, losartan 50 mg daily and amlodipine 5 mg daily. Followed by cardiology.        Other   Adjustment disorder with mixed anxiety and depressed mood    -Stable. Continue duloxetine 20 mg daily and hydroxyzine 25-50 mg as needed for severe anxiety.      Pure hypercholesterolemia    -Followed by cardiology. Intolerance to several medications. Last direct LDL 123. Recommend to follow a heart healthy diet low in fat. Recommend repeating lipid panel at follow-up visit.      Other Visit Diagnoses     Insomnia, unspecified type          Insomnia: -Recommend to continue amitriptyline 50 mg at bedtime as needed for sleep.  Return in about 4 months (around 05/22/2022) for HLD, HTN, thyroid and FBW.        Lorrene Reid, PA-C  [facility] [phone] (phone) [phone] (fax)  [place]

## 2022-02-04 ENCOUNTER — Ambulatory Visit
Admission: RE | Admit: 2022-02-04 | Discharge: 2022-02-04 | Disposition: A | Discharge status: Home / Self Care | Payer: Medicare HMO | Source: Ambulatory Visit

## 2022-02-04 DIAGNOSIS — Z1231 Encounter for screening mammogram for malignant neoplasm of breast: Secondary | ICD-10-CM

## 2022-02-17 ENCOUNTER — Other Ambulatory Visit: Payer: Self-pay | Admitting: Cardiology

## 2022-04-22 DIAGNOSIS — H40003 Preglaucoma, unspecified, bilateral: Secondary | ICD-10-CM | POA: Diagnosis not present

## 2022-04-22 DIAGNOSIS — H2511 Age-related nuclear cataract, right eye: Secondary | ICD-10-CM | POA: Diagnosis not present

## 2022-04-22 DIAGNOSIS — H2513 Age-related nuclear cataract, bilateral: Secondary | ICD-10-CM | POA: Diagnosis not present

## 2022-04-22 DIAGNOSIS — H43813 Vitreous degeneration, bilateral: Secondary | ICD-10-CM | POA: Diagnosis not present

## 2022-04-22 DIAGNOSIS — Z0100 Encounter for examination of eyes and vision without abnormal findings: Secondary | ICD-10-CM | POA: Diagnosis not present

## 2022-04-22 DIAGNOSIS — H2512 Age-related nuclear cataract, left eye: Secondary | ICD-10-CM | POA: Diagnosis not present

## 2022-05-02 ENCOUNTER — Other Ambulatory Visit: Payer: Self-pay

## 2022-05-02 DIAGNOSIS — F4323 Adjustment disorder with mixed anxiety and depressed mood: Secondary | ICD-10-CM

## 2022-05-02 MED ORDER — BUPROPION HCL ER (XL) 150 MG PO TB24: 150.0000 mg | ORAL_TABLET | Freq: Every day | ORAL | 0 refills | Status: DC

## 2022-05-06 ENCOUNTER — Other Ambulatory Visit: Payer: Self-pay

## 2022-05-06 DIAGNOSIS — F4323 Adjustment disorder with mixed anxiety and depressed mood: Secondary | ICD-10-CM

## 2022-05-06 DIAGNOSIS — I10 Essential (primary) hypertension: Secondary | ICD-10-CM

## 2022-05-06 DIAGNOSIS — G4700 Insomnia, unspecified: Secondary | ICD-10-CM

## 2022-05-06 MED ORDER — SPIRONOLACTONE 25 MG PO TABS: 25.0000 mg | ORAL_TABLET | Freq: Every day | ORAL | 0 refills | Status: DC

## 2022-05-06 MED ORDER — AMITRIPTYLINE HCL 50 MG PO TABS: 50.0000 mg | ORAL_TABLET | Freq: Every day | ORAL | 0 refills | Status: DC

## 2022-05-09 ENCOUNTER — Other Ambulatory Visit: Payer: Self-pay

## 2022-05-09 DIAGNOSIS — K219 Gastro-esophageal reflux disease without esophagitis: Secondary | ICD-10-CM

## 2022-05-09 MED ORDER — OMEPRAZOLE 20 MG PO CPDR: DELAYED_RELEASE_CAPSULE | ORAL | 0 refills | Status: DC

## 2022-05-11 NOTE — Progress Notes (Signed)
HPI: FU CAD. Previously followed by Dr Einar Gip. Pt had NSTEMI [id number] with complicated PCI of RCA (performed in [facility]). ABI 12/15 normal. Cath 6/16 showed 100 PDA, patent stents in proximal and mid RCA, no obstructive disease in the left system; collaterals to the PDA noted; PTCA of CTO unsuccessful. Last cath 10/17 showed 100 PDA (filled with left to right collaterals), patent RCA stent and normal LV function. H/O cough with ACEI. Myalgias with crestor. Dr Martinique reviewed and did not think attempt at CTO intervention or CABG was appropriate.  Neck CTA June 2021 showed no significant carotid stenosis. Echo 3/23 showed normal LV function; mild LVH; mild AI; mildly dilated ascending aorta (41 mm). CTA June 2023 showed 4.1 cm ascending thoracic aorta, dilated pulmonary artery.  PET scan November 2023 showed mild ischemia in the apical to mid inferior wall and ejection fraction 51%.  Since last seen, she continues to have some dyspnea on exertion and chest tightness which is chronic and unchanged.  She denies syncope.  Occasional mild pedal edema.  Current Outpatient Medications  Medication Sig Dispense Refill   acetaminophen (TYLENOL) 500 MG tablet Take 1,000 mg by mouth every 8 (eight) hours as needed (for pain or headaches).     albuterol (VENTOLIN HFA) 108 (90 Base) MCG/ACT inhaler INHALE 1 TO 2 PUFFS INTO THE LUNGS EVERY 6 HOURS AS NEEDED FOR WHEEZING OR SHORTNESS OF BREATH. 1 each 1   amitriptyline (ELAVIL) 50 MG tablet Take 1 tablet (50 mg total) by mouth at bedtime. 90 tablet 0   amLODipine (NORVASC) 5 MG tablet TAKE 1 TABLET EVERY DAY 90 tablet 3   aspirin EC 81 MG tablet Take 81 mg by mouth daily at 6 PM.      B Complex Vitamins (VITAMIN B COMPLEX PO) Take 1 tablet by mouth daily.     budesonide (PULMICORT) 0.25 MG/2ML nebulizer solution Take 2 mLs (0.25 mg total) by nebulization in the morning and at bedtime. 60 mL 3   buPROPion (WELLBUTRIN XL) 150 MG 24 hr tablet Take 1 tablet (150 mg total) by  mouth daily. 90 tablet 0   chlorpheniramine-HYDROcodone (TUSSIONEX PENNKINETIC ER) 10-8 MG/5ML SUER Take 5 mLs by mouth every 12 (twelve) hours as needed for cough. 140 mL 0   chlorpheniramine-HYDROcodone (TUSSIONEX) 10-8 MG/5ML 5 ml every 12 hours if needed for cough 115 mL 0   Cholecalciferol (VITAMIN D3) 5000 units CAPS Take 5,000 Units by mouth daily.      cyclobenzaprine (FLEXERIL) 10 MG tablet TAKE 1 TABLET THREE TIMES DAILY AS NEEDED FOR MUSCLE SPASM(S) 30 tablet 1   DULoxetine (CYMBALTA) 20 MG capsule TAKE 2 CAPSULES EVERY DAY 180 capsule 10   fexofenadine (ALLEGRA) 180 MG tablet Take 1 tablet (180 mg total) by mouth daily. 90 tablet 3   hydrOXYzine (ATARAX) 25 MG tablet TAKE 1 TO 2 TABLETS BY MOUTH EVERY 6 TO 8 HOURS AS NEEDED FOR ANXIETY 60 tablet 0   levothyroxine (SYNTHROID) 175 MCG tablet TAKE 1 TABLET EVERY DAY BEFORE BREAKFAST 90 tablet 1   losartan (COZAAR) 50 MG tablet TAKE 1 TABLET EVERY DAY 90 tablet 10   Multiple Vitamin (MULTIVITAMIN) tablet Take 1 tablet by mouth daily.     nitroGLYCERIN (NITROSTAT) 0.4 MG SL tablet Place 0.4 mg under the tongue every 5 (five) minutes as needed for chest pain.     omeprazole (PRILOSEC) 20 MG capsule TAKE 1 CAPSULE IN THE MORNING AND AT BEDTIME. 180 capsule 0   spironolactone (ALDACTONE)  25 MG tablet Take 1 tablet (25 mg total) by mouth daily. 90 tablet 0   No current facility-administered medications for this visit.     Past Medical History:  Diagnosis Date   Allergy    Anxiety    COPD (chronic obstructive pulmonary disease) ([place])    Coronary artery disease    a. s/p PCI of RCA in 2015 b. cath in 12/2015 showed 100% Ost RPDA with collaterals present   DDD (degenerative disc disease), cervical    Depression    DJD (degenerative joint disease) of knee    RT   GERD (gastroesophageal reflux disease)    Hashimoto's disease    Headache(784.0)    Hyperlipidemia    Hypertension    Hypothyroidism    Myocardial infarction ([place])     Nocturnal hypoxemia 02/26/2015   On supplemental oxygen therapy    Oxygen 2.5 l/m at bedtime.   Stented coronary artery 02/26/2015    Past Surgical History:  Procedure Laterality Date   ABDOMINAL HYSTERECTOMY     APPENDECTOMY  1971   BUNIONECTOMY Right    CARDIAC CATHETERIZATION N/A 09/02/2014   Procedure: Left Heart Cath and Coronary Angiography;  Surgeon: Adrian Prows, MD;  Location: [place] [facility];  Service: Cardiovascular;  Laterality: N/A;   CARDIAC CATHETERIZATION N/A 12/15/2015   Procedure: Left Heart Cath and Coronary Angiography;  Surgeon: Jettie Booze, MD;  Location: [place] [facility];  Service: Cardiovascular;  Laterality: N/A;   CERVICAL FUSION     CESAREAN SECTION     COLONOSCOPY WITH PROPOFOL N/A 02/24/2015   Procedure: COLONOSCOPY WITH PROPOFOL;  Surgeon: Garlan Fair, MD;  Location: [facility];  Service: Endoscopy;  Laterality: N/A;   ESOPHAGOGASTRODUODENOSCOPY (EGD) WITH PROPOFOL N/A 02/24/2015   Procedure: ESOPHAGOGASTRODUODENOSCOPY (EGD) WITH PROPOFOL;  Surgeon: Garlan Fair, MD;  Location: [facility];  Service: Endoscopy;  Laterality: N/A;   HAND SURGERY     due to trauma-dog bite.   I & D EXTREMITY Bilateral 05/17/2013   Procedure: IRRIGATION AND DEBRIDEMENT EXTREMITY;  Surgeon: Roseanne Kaufman, MD;  Location: [place];  Service: Orthopedics;  Laterality: Bilateral;   LEFT HEART CATHETERIZATION WITH CORONARY ANGIOGRAM N/A 03/04/2014   Procedure: LEFT HEART CATHETERIZATION WITH CORONARY ANGIOGRAM;  Surgeon: Laverda Page, MD;  Location: [facility];  Service: Cardiovascular;  Laterality: N/A;   OOPHORECTOMY     SHOULDER SURGERY     RT   [place]   TOTAL ABDOMINAL HYSTERECTOMY W/ BILATERAL SALPINGOOPHORECTOMY  01/2008   with cervical dysplasia   TUBAL LIGATION  1984    Social History   Socioeconomic History   Marital status: Widowed    Spouse name: Not on file   Number of children: 1   Years of  education: Not on file   Highest education level: Not on file  Occupational History   Occupation: disabled    Employer: [employer]  Tobacco Use   Smoking status: Former    Packs/day: 2.00    Years: 25.00    Total pack years: 50.00    Types: Cigarettes    Quit date: 04/23/1996    Years since quitting: 26.0   Smokeless tobacco: Never  Vaping Use   Vaping Use: Never used  Substance and Sexual Activity   Alcohol use: No    Alcohol/week: 0.0 standard drinks of alcohol   Drug use: No   Sexual activity: Not Currently  Other Topics Concern   Not on file  Social History Narrative   On worker's comp for lower back and right shoulder - applying for disability; losing [employer] insurance 03/07/2010   Social Determinants of Health   Financial Resource Strain: Not on file  Food Insecurity: Not on file  Transportation Needs: Not on file  Physical Activity: Not on file  Stress: Not on file  Social Connections: Not on file  Intimate Partner Violence: Not on file    Family History  Problem Relation Age of Onset   Lung cancer Mother    Hypertension Mother    Stroke Mother    Diabetes Mother    Diabetes Father    Hypertension Father    Heart attack Father    Asthma Father    COPD Father    Alcohol abuse Father    Diabetes Sister    Hypertension Sister    Breast cancer Neg Hx     ROS: no fevers or chills, productive cough, hemoptysis, dysphasia, odynophagia, melena, hematochezia, dysuria, hematuria, rash, seizure activity, orthopnea, PND, pedal edema, claudication. Remaining systems are negative.  Physical Exam: Well-developed well-nourished in no acute distress.  Skin is warm and dry.  HEENT is normal.  Neck is supple.  Chest is clear to auscultation with normal expansion.  Cardiovascular exam is regular rate and rhythm.  Abdominal exam nontender or distended. No masses palpated. Extremities show no edema. neuro grossly intact  ECG-normal sinus rhythm at a rate of 81, normal  axis, nonspecific ST changes.  Personally reviewed  A/P  1 chest pain-patient has chronic symptoms.  Recent PET scan showed mild ischemia in the distribution of her occluded PDA which is known.  Plan medical therapy.  2 coronary artery disease-continue aspirin.  She is intolerant to statins.  3 dyspnea-previous echocardiogram showed normal LV function and PET scan showed mild ischemia.  Will not pursue further cardiac evaluation at this point.  4 dilated ascending aorta-mild on most recent CT.  Will plan follow-up study June 2024.  5 hyperlipidemia-patient is intolerant to statins and also did not tolerate Zetia.  She felt Repatha caused a cough.  She is not interested in other lipid-lowering medications.  6 hypertension-patient's blood pressure is controlled.  Continue present medications.  She has had multiple medication intolerances previously.  Check potassium and renal function.  Kirk Ruths, MD

## 2022-05-18 ENCOUNTER — Ambulatory Visit: Payer: Medicare HMO | Attending: Cardiology | Admitting: Cardiology

## 2022-05-18 ENCOUNTER — Encounter: Payer: Self-pay | Admitting: Cardiology

## 2022-05-18 VITALS — BP 118/82 | HR 81 | Ht 66.0 in | Wt 197.2 lb

## 2022-05-18 DIAGNOSIS — R0602 Shortness of breath: Secondary | ICD-10-CM | POA: Diagnosis not present

## 2022-05-18 DIAGNOSIS — I2510 Atherosclerotic heart disease of native coronary artery without angina pectoris: Secondary | ICD-10-CM

## 2022-05-18 DIAGNOSIS — R072 Precordial pain: Secondary | ICD-10-CM

## 2022-05-18 DIAGNOSIS — J449 Chronic obstructive pulmonary disease, unspecified: Secondary | ICD-10-CM | POA: Diagnosis not present

## 2022-05-18 DIAGNOSIS — E7800 Pure hypercholesterolemia, unspecified: Secondary | ICD-10-CM | POA: Diagnosis not present

## 2022-05-18 DIAGNOSIS — I10 Essential (primary) hypertension: Secondary | ICD-10-CM

## 2022-05-18 DIAGNOSIS — I712 Thoracic aortic aneurysm, without rupture, unspecified: Secondary | ICD-10-CM

## 2022-05-18 NOTE — Patient Instructions (Signed)
    Follow-Up: At [facility], you and your health needs are our priority.  As part of our continuing mission to provide you with exceptional heart care, we have created designated Provider Care Teams.  These Care Teams include your primary Cardiologist (physician) and Advanced Practice Providers (APPs -  Physician Assistants and Nurse Practitioners) who all work together to provide you with the care you need, when you need it.  We recommend signing up for the patient portal called "MyChart".  Sign up information is provided on this After Visit Summary.  MyChart is used to connect with patients for Virtual Visits (Telemedicine).  Patients are able to view lab/test results, encounter notes, upcoming appointments, etc.  Non-urgent messages can be sent to your provider as well.   To learn more about what you can do with MyChart, go to [email or web address].    Your next appointment:   6 month(s)  Provider:   Brian Crenshaw, MD      

## 2022-05-19 ENCOUNTER — Other Ambulatory Visit: Payer: Medicare HMO

## 2022-05-19 DIAGNOSIS — E785 Hyperlipidemia, unspecified: Secondary | ICD-10-CM

## 2022-05-19 DIAGNOSIS — E7800 Pure hypercholesterolemia, unspecified: Secondary | ICD-10-CM | POA: Diagnosis not present

## 2022-05-19 DIAGNOSIS — I10 Essential (primary) hypertension: Secondary | ICD-10-CM

## 2022-05-19 DIAGNOSIS — Z0000 Encounter for general adult medical examination without abnormal findings: Secondary | ICD-10-CM | POA: Diagnosis not present

## 2022-05-20 LAB — COMPREHENSIVE METABOLIC PANEL
ALT: 11 IU/L (ref 0–32)
AST: 21 IU/L (ref 0–40)
Albumin/Globulin Ratio: 1.7 (ref 1.2–2.2)
Albumin: 4.5 g/dL (ref 3.9–4.9)
Alkaline Phosphatase: 100 IU/L (ref 44–121)
BUN/Creatinine Ratio: 19 (ref 12–28)
BUN: 16 mg/dL (ref 8–27)
Bilirubin Total: 0.3 mg/dL (ref 0.0–1.2)
CO2: 21 mmol/L (ref 20–29)
Calcium: 9.6 mg/dL (ref 8.7–10.3)
Chloride: 102 mmol/L (ref 96–106)
Creatinine, Ser: 0.83 mg/dL (ref 0.57–1.00)
Globulin, Total: 2.6 g/dL (ref 1.5–4.5)
Glucose: 82 mg/dL (ref 70–99)
Potassium: 5 mmol/L (ref 3.5–5.2)
Sodium: 139 mmol/L (ref 134–144)
Total Protein: 7.1 g/dL (ref 6.0–8.5)
eGFR: 79 mL/min/{1.73_m2} (ref >59)

## 2022-05-20 LAB — LIPID PANEL
Chol/HDL Ratio: 3.8 ratio (ref 0.0–4.4)
Cholesterol, Total: 204 mg/dL — ABNORMAL HIGH (ref 100–199)
HDL: 53 mg/dL (ref >39)
LDL Chol Calc (NIH): 122 mg/dL — ABNORMAL HIGH (ref 0–99)
Triglycerides: 162 mg/dL — ABNORMAL HIGH (ref 0–149)
VLDL Cholesterol Cal: 29 mg/dL (ref 5–40)

## 2022-05-20 LAB — CBC WITH DIFFERENTIAL/PLATELET
Basophils Absolute: 0.1 10*3/uL (ref 0.0–0.2)
Basos: 1 %
EOS (ABSOLUTE): 0.2 10*3/uL (ref 0.0–0.4)
Eos: 3 %
Hematocrit: 41.9 % (ref 34.0–46.6)
Hemoglobin: 14.2 g/dL (ref 11.1–15.9)
Immature Grans (Abs): 0 10*3/uL (ref 0.0–0.1)
Immature Granulocytes: 0 %
Lymphocytes Absolute: 1.9 10*3/uL (ref 0.7–3.1)
Lymphs: 33 %
MCH: 29.2 pg (ref 26.6–33.0)
MCHC: 33.9 g/dL (ref 31.5–35.7)
MCV: 86 fL (ref 79–97)
Monocytes Absolute: 0.4 10*3/uL (ref 0.1–0.9)
Monocytes: 7 %
Neutrophils Absolute: 3.2 10*3/uL (ref 1.4–7.0)
Neutrophils: 56 %
Platelets: 331 10*3/uL (ref 150–450)
RBC: 4.87 x10E6/uL (ref 3.77–5.28)
RDW: 12.5 % (ref 11.7–15.4)
WBC: 5.7 10*3/uL (ref 3.4–10.8)

## 2022-05-20 LAB — HEMOGLOBIN A1C
Est. average glucose Bld gHb Est-mCnc: 117 mg/dL
Hgb A1c MFr Bld: 5.7 % — ABNORMAL HIGH (ref 4.8–5.6)

## 2022-05-20 LAB — T3, FREE: T3, Free: 2.7 pg/mL (ref 2.0–4.4)

## 2022-05-20 LAB — T4: T4, Total: 12.7 ug/dL — ABNORMAL HIGH (ref 4.5–12.0)

## 2022-05-20 LAB — TSH: TSH: 3.78 u[IU]/mL (ref 0.450–4.500)

## 2022-05-23 ENCOUNTER — Encounter: Payer: Self-pay | Admitting: Family Medicine

## 2022-05-23 ENCOUNTER — Ambulatory Visit (INDEPENDENT_AMBULATORY_CARE_PROVIDER_SITE_OTHER): Payer: Medicare HMO | Admitting: Family Medicine

## 2022-05-23 VITALS — BP 116/81 | HR 79 | Resp 18 | Ht 66.0 in | Wt 195.0 lb

## 2022-05-23 DIAGNOSIS — E890 Postprocedural hypothyroidism: Secondary | ICD-10-CM | POA: Diagnosis not present

## 2022-05-23 DIAGNOSIS — B029 Zoster without complications: Secondary | ICD-10-CM

## 2022-05-23 DIAGNOSIS — F4323 Adjustment disorder with mixed anxiety and depressed mood: Secondary | ICD-10-CM | POA: Diagnosis not present

## 2022-05-23 DIAGNOSIS — E7800 Pure hypercholesterolemia, unspecified: Secondary | ICD-10-CM | POA: Diagnosis not present

## 2022-05-23 DIAGNOSIS — I10 Essential (primary) hypertension: Secondary | ICD-10-CM

## 2022-05-23 MED ORDER — LEVOTHYROXINE SODIUM 150 MCG PO TABS: 150.0000 ug | ORAL_TABLET | Freq: Every day | ORAL | 1 refills | Status: DC

## 2022-05-23 MED ORDER — HYDROXYZINE HCL 25 MG PO TABS: ORAL_TABLET | ORAL | 2 refills | Status: DC

## 2022-05-23 MED ORDER — VALACYCLOVIR HCL 1 G PO TABS: 1000.0000 mg | ORAL_TABLET | Freq: Three times a day (TID) | ORAL | 0 refills | Status: AC

## 2022-05-23 NOTE — Assessment & Plan Note (Signed)
-  Stable. Continue duloxetine 20 mg daily and hydroxyzine 25-50 mg as needed for severe anxiety. 

## 2022-05-23 NOTE — Assessment & Plan Note (Signed)
Last TSH within normal limits, elevated T4.  Decreasing levothyroxine to 150 mcg daily, will recheck thyroid levels in about 6 weeks.  Once thyroid levels are normalized, we will also discuss weight loss.

## 2022-05-23 NOTE — Assessment & Plan Note (Signed)
Stable, continue losartan 50 mg daily, amlodipine 5 mg daily, spironolactone 25 mg daily.  Last CMP within normal limits.  Followed by cardiology.

## 2022-05-23 NOTE — Assessment & Plan Note (Signed)
Previously failed 3 statins and Zetia.  We discussed that cholesterol levels may normalize with weight loss, encouraged heart healthy diet low in fats.  We also discussed that it may be possible to try a bile acid sequestrant if necessary in the future to decrease risk of ASCVD, they can affect thyroid levels so we will need to be cognizant of that.  Will continue to monitor, especially as weight management efforts progress.

## 2022-05-23 NOTE — Progress Notes (Signed)
Established Patient Office Visit  Subjective   Patient ID: Amy Hodge, female    DOB: 03-15-1955  Age: 67 y.o. MRN: [id number]  Chief Complaint  Patient presents with   Medical Management of Chronic Issues   Hyperlipidemia   Hypertension   Hypothyroidism   Herpes Zoster    Back of scalp left side    HPI Amy Hodge is a 67 y.o. female presenting today for follow up of hypertension, hyperlipidemia, thyroid. Hypertension: Patient here for follow-up of elevated blood pressure. She is exercising and is adherent to low salt diet.   Pt denies chest pain, SOB, dizziness, edema, syncope, fatigue or heart palpitations. Taking losartan, spironolactone, amlodipine, reports excellent compliance with treatment. Denies side effects. Hyperlipidemia: Currently managing with diet and lifestyle alone, previously failed 3 statins and Zetia.  She has noticed that her cholesterol increases when her weight increases, and she has been unable to lose weight no matter what she does.   Hypothyroidism: Taking levothyroxine 175 mcg regularly in the AM away from food and vitamins. Denies weight changes, heat/cold intolerance, skin/hair changes, bowel changes, CVS symptoms.  She does endorse some fatigue over the past several weeks. Patient is also experiencing what she believes to be other episode of shingles.  She endorses burning and itching in the left occipital region of her head that feels similar to the past 2 times that she has started developing shingles.  She has not yet been able to get the shingles vaccines, but would like to once she gets through this episode.  ROS Negative unless otherwise noted in HPI   Objective:     BP 116/81 (BP Location: Left Arm, Patient Position: Sitting, Cuff Size: Normal)   Pulse 79   Resp 18   Ht 5\' 6"  (1.676 m)   Wt 195 lb (88.5 kg)   SpO2 96%   BMI 31.47 kg/m   Physical Exam Constitutional:      General: She is not in acute distress.    Appearance:  Normal appearance.  HENT:     Head: Normocephalic and atraumatic.     Comments: Mildly erythematous rash in the left occipital region Eyes:     Extraocular Movements: Extraocular movements intact.     Conjunctiva/sclera: Conjunctivae normal.  Cardiovascular:     Rate and Rhythm: Normal rate and regular rhythm.     Pulses: Normal pulses.     Heart sounds: No murmur heard.    No friction rub. No gallop.  Pulmonary:     Effort: Pulmonary effort is normal. No respiratory distress.     Breath sounds: No wheezing, rhonchi or rales.  Skin:    General: Skin is warm and dry.  Neurological:     Mental Status: She is alert and oriented to person, place, and time.     Assessment & Plan:  HTN, goal below 130/80 Assessment & Plan: Stable, continue losartan 50 mg daily, amlodipine 5 mg daily, spironolactone 25 mg daily.  Last CMP within normal limits.  Followed by cardiology.   Postoperative hypothyroidism Assessment & Plan: Last TSH within normal limits, elevated T4.  Decreasing levothyroxine to 150 mcg daily, will recheck thyroid levels in about 6 weeks.  Once thyroid levels are normalized, we will also discuss weight loss.  Orders: -     Levothyroxine Sodium; Take 1 tablet (150 mcg total) by mouth daily before breakfast.  Dispense: 30 tablet; Refill: 1  Pure hypercholesterolemia Assessment & Plan: Previously failed 3 statins and Zetia.  We discussed that cholesterol levels may normalize with weight loss, encouraged heart healthy diet low in fats.  We also discussed that it may be possible to try a bile acid sequestrant if necessary in the future to decrease risk of ASCVD, they can affect thyroid levels so we will need to be cognizant of that.  Will continue to monitor, especially as weight management efforts progress.   Adjustment disorder with mixed anxiety and depressed mood Assessment & Plan: Stable. Continue duloxetine 20 mg daily and hydroxyzine 25-50 mg as needed for severe  anxiety.  Orders: -     hydrOXYzine HCl; TAKE 1 TO 2 TABLETS BY MOUTH EVERY 6 TO 8 HOURS AS NEEDED FOR ANXIETY  Dispense: 60 tablet; Refill: 2  Herpes zoster without complication -     valACYclovir HCl; Take 1 tablet (1,000 mg total) by mouth 3 (three) times daily for 7 days.  Dispense: 21 tablet; Refill: 0  Prescribed 7-day course of valacyclovir for herpes zoster, recommend getting shingles vaccines after this episode since this is the third time that it has happened.  Patient verbalizes understanding and is agreeable with this plan.  Return in about 6 weeks (around 07/04/2022) for follow-up, blood work (not fasting) 1 week before.  At that time, we will also need to schedule her annual wellness visit.   Velva Harman, PA

## 2022-05-31 ENCOUNTER — Other Ambulatory Visit: Payer: Self-pay | Admitting: Internal Medicine

## 2022-06-01 MED ORDER — HYDROCOD POLI-CHLORPHE POLI ER 10-8 MG/5ML PO SUER: ORAL | 0 refills | Status: DC

## 2022-06-01 NOTE — Telephone Encounter (Signed)
Tussionex refilled 

## 2022-06-10 ENCOUNTER — Other Ambulatory Visit: Payer: Self-pay | Admitting: Family Medicine

## 2022-06-10 DIAGNOSIS — E890 Postprocedural hypothyroidism: Secondary | ICD-10-CM

## 2022-06-29 ENCOUNTER — Other Ambulatory Visit: Payer: Medicare HMO

## 2022-06-29 DIAGNOSIS — E890 Postprocedural hypothyroidism: Secondary | ICD-10-CM

## 2022-06-30 LAB — T4: T4, Total: 11.8 ug/dL (ref 4.5–12.0)

## 2022-06-30 LAB — TSH: TSH: 11.7 u[IU]/mL — ABNORMAL HIGH (ref 0.450–4.500)

## 2022-06-30 LAB — T3, FREE: T3, Free: 2.5 pg/mL (ref 2.0–4.4)

## 2022-07-05 ENCOUNTER — Encounter: Payer: Self-pay | Admitting: Family Medicine

## 2022-07-05 ENCOUNTER — Ambulatory Visit: Payer: Medicare HMO | Admitting: Family Medicine

## 2022-07-05 ENCOUNTER — Ambulatory Visit (INDEPENDENT_AMBULATORY_CARE_PROVIDER_SITE_OTHER): Payer: Medicare HMO | Admitting: Family Medicine

## 2022-07-05 VITALS — BP 120/83 | HR 88 | Resp 18 | Ht 66.0 in | Wt 200.0 lb

## 2022-07-05 DIAGNOSIS — E890 Postprocedural hypothyroidism: Secondary | ICD-10-CM

## 2022-07-05 DIAGNOSIS — I10 Essential (primary) hypertension: Secondary | ICD-10-CM | POA: Diagnosis not present

## 2022-07-05 DIAGNOSIS — I2510 Atherosclerotic heart disease of native coronary artery without angina pectoris: Secondary | ICD-10-CM | POA: Diagnosis not present

## 2022-07-05 DIAGNOSIS — E669 Obesity, unspecified: Secondary | ICD-10-CM | POA: Diagnosis not present

## 2022-07-05 MED ORDER — WEGOVY 0.25 MG/0.5ML ~~LOC~~ SOAJ: 0.2500 mg | SUBCUTANEOUS | 0 refills | Status: DC

## 2022-07-05 NOTE — Patient Instructions (Signed)
If your mail order pharmacy is not able to get you the [facility], let me know and I will send the prescription in to the [facility] health pharmacy at [facility] [address] [facility].  Stay hydrated and eat small meals to avoid any of the tummy upset that can happen with this medication.

## 2022-07-05 NOTE — Assessment & Plan Note (Signed)
On most recent thyroid function test, TSH elevated at 11.7, T3 and T4 within normal limits.  Patient was experiencing weight gain and changed herself back to 175 mcg daily.  We discussed that we will need to again recheck thyroid levels in about 6 weeks to ensure that she does not go back into having an abnormality in her T3 or T4.  Patient verbalized understanding and is agreeable to this plan.

## 2022-07-05 NOTE — Progress Notes (Signed)
Established Patient Office Visit  Subjective   Patient ID: Amy Hodge, female    DOB: 07-Feb-1956  Age: 67 y.o. MRN: [id number]  Chief Complaint  Patient presents with   Hypertension   Hypothyroidism    Hypertension   Amy Hodge is a 67 y.o. female presenting today for follow up of hypertension, hypothyroidism.  She was also able to get in touch with her insurance company and found that they will cover [facility] for weight management.  She states that she is hopeful that if she is able to reduce her weight that she will improve her overall metabolic health and may be able to positively affect her hypertension and hyperlipidemia. Hypertension: Patient here for follow-up of elevated blood pressure.   Pt denies chest pain, SOB, dizziness, edema, syncope, fatigue or heart palpitations. Taking losartan, spironolactone, amlodipine, reports excellent compliance with treatment. Denies side effects. Hypothyroidism: Taking levothyroxine regularly in the AM away from food and vitamins.  She was experiencing an increase in weight, and after seeing the results of her thyroid function tests last week switched her levothyroxine from 150 mcg daily back to 175 mcg daily.  Symptomatically, she has been feeling much better.  Denies fatigue, heat/cold intolerance, skin/hair changes, bowel changes, CVS symptoms.  ROS Negative unless otherwise noted in HPI   Objective:     BP 120/83 (BP Location: Left Arm, Patient Position: Sitting, Cuff Size: Normal)   Pulse 88   Resp 18   Ht 5\' 6"  (1.676 m)   Wt 200 lb (90.7 kg)   SpO2 92%   BMI 32.28 kg/m   Physical Exam Constitutional:      General: She is not in acute distress.    Appearance: Normal appearance.  HENT:     Head: Normocephalic and atraumatic.  Cardiovascular:     Rate and Rhythm: Normal rate and regular rhythm.     Heart sounds: No murmur heard.    No friction rub. No gallop.  Pulmonary:     Effort: Pulmonary effort is normal. No  respiratory distress.     Breath sounds: No wheezing, rhonchi or rales.  Musculoskeletal:     Cervical back: Normal range of motion.  Skin:    General: Skin is warm and dry.  Neurological:     Mental Status: She is alert and oriented to person, place, and time.  Psychiatric:        Mood and Affect: Mood normal.     Assessment & Plan:  HTN, goal below 130/80 Assessment & Plan: Stable. Continue losartan 50 mg daily, amlodipine 5 mg daily, spironolactone 25 mg daily.  Last CMP within normal limits.    Orders: -     Comprehensive metabolic panel; Future  Postoperative hypothyroidism Assessment & Plan: On most recent thyroid function test, TSH elevated at 11.7, T3 and T4 within normal limits.  Patient was experiencing weight gain and changed herself back to 175 mcg daily.  We discussed that we will need to again recheck thyroid levels in about 6 weeks to ensure that she does not go back into having an abnormality in her T3 or T4.  Patient verbalized understanding and is agreeable to this plan.  Orders: -     T3, free; Future -     T4, free; Future -     TSH; Future  Obesity, Class I, BMI 30-34.9 Assessment & Plan: Patient contacted her insurance and found that they would cover [facility] with a prior authorization.  Starting [facility] 0.25  mg injection weekly for its benefits and weight management as long as cardiovascular risk reduction given her history of ACS and CAD.  We discussed the mechanism of action, common side effects, and titration schedule.  Patient verbalized understanding.  We will follow-up in about 1 month to ensure that she is tolerating the medication well and can go up to the next dose at that point if necessary.  Orders: -     Wegovy; Inject 0.25 mg into the skin once a week.  Dispense: 2 mL; Refill: 0 -     Comprehensive metabolic panel; Future  Coronary artery disease involving native coronary artery of native heart without angina pectoris -     MJ.Hock; Inject 0.25 mg  into the skin once a week.  Dispense: 2 mL; Refill: 0    Return in about 5 weeks (around 08/09/2022) for follow-up for starting Wegovy, blood work (not fasting) 1 week before - CMP, TSH, T3, T4.    Melida Quitter, PA

## 2022-07-05 NOTE — Assessment & Plan Note (Signed)
Patient contacted her insurance and found that they would cover [facility] with a prior authorization.  Starting Wegovy 0.25 mg injection weekly for its benefits and weight management as long as cardiovascular risk reduction given her history of ACS and CAD.  We discussed the mechanism of action, common side effects, and titration schedule.  Patient verbalized understanding.  We will follow-up in about 1 month to ensure that she is tolerating the medication well and can go up to the next dose at that point if necessary.

## 2022-07-05 NOTE — Assessment & Plan Note (Signed)
Stable. Continue losartan 50 mg daily, amlodipine 5 mg daily, spironolactone 25 mg daily.  Last CMP within normal limits.

## 2022-07-08 ENCOUNTER — Other Ambulatory Visit: Payer: Self-pay

## 2022-07-08 DIAGNOSIS — G4733 Obstructive sleep apnea (adult) (pediatric): Secondary | ICD-10-CM

## 2022-07-08 MED ORDER — ALBUTEROL SULFATE HFA 108 (90 BASE) MCG/ACT IN AERS: INHALATION_SPRAY | RESPIRATORY_TRACT | 3 refills | Status: DC

## 2022-07-11 ENCOUNTER — Other Ambulatory Visit: Payer: Self-pay

## 2022-07-11 ENCOUNTER — Other Ambulatory Visit (HOSPITAL_COMMUNITY): Payer: Self-pay

## 2022-07-11 ENCOUNTER — Encounter: Payer: Self-pay | Admitting: Family Medicine

## 2022-07-11 DIAGNOSIS — I2510 Atherosclerotic heart disease of native coronary artery without angina pectoris: Secondary | ICD-10-CM

## 2022-07-11 DIAGNOSIS — E669 Obesity, unspecified: Secondary | ICD-10-CM

## 2022-07-11 MED ORDER — WEGOVY 0.25 MG/0.5ML ~~LOC~~ SOAJ
0.2500 mg | SUBCUTANEOUS | 0 refills | Status: DC
  Filled 2022-07-11 – 2022-07-14 (×2): qty 2, 28d supply, fill #0

## 2022-07-12 ENCOUNTER — Telehealth: Payer: Self-pay | Admitting: *Deleted

## 2022-07-12 NOTE — Telephone Encounter (Signed)
Opened in error, should have been chart and not call.

## 2022-07-14 ENCOUNTER — Telehealth: Payer: Self-pay

## 2022-07-14 ENCOUNTER — Other Ambulatory Visit (HOSPITAL_COMMUNITY): Payer: Self-pay

## 2022-07-14 NOTE — Telephone Encounter (Signed)
Contacted [facility] Amor to schedule their annual wellness visit. Appointment made for 07/26/22.

## 2022-07-15 ENCOUNTER — Other Ambulatory Visit: Payer: Self-pay | Admitting: Family Medicine

## 2022-07-15 DIAGNOSIS — G4700 Insomnia, unspecified: Secondary | ICD-10-CM

## 2022-07-15 DIAGNOSIS — F4323 Adjustment disorder with mixed anxiety and depressed mood: Secondary | ICD-10-CM

## 2022-07-15 DIAGNOSIS — I10 Essential (primary) hypertension: Secondary | ICD-10-CM

## 2022-07-20 ENCOUNTER — Other Ambulatory Visit: Payer: Self-pay | Admitting: *Deleted

## 2022-07-20 ENCOUNTER — Other Ambulatory Visit (HOSPITAL_COMMUNITY): Payer: Self-pay

## 2022-07-20 ENCOUNTER — Other Ambulatory Visit: Payer: Self-pay | Admitting: Family Medicine

## 2022-07-20 DIAGNOSIS — I712 Thoracic aortic aneurysm, without rupture, unspecified: Secondary | ICD-10-CM

## 2022-07-20 DIAGNOSIS — L814 Other melanin hyperpigmentation: Secondary | ICD-10-CM | POA: Diagnosis not present

## 2022-07-20 DIAGNOSIS — C44622 Squamous cell carcinoma of skin of right upper limb, including shoulder: Secondary | ICD-10-CM | POA: Diagnosis not present

## 2022-07-20 DIAGNOSIS — D492 Neoplasm of unspecified behavior of bone, soft tissue, and skin: Secondary | ICD-10-CM | POA: Diagnosis not present

## 2022-07-20 DIAGNOSIS — D225 Melanocytic nevi of trunk: Secondary | ICD-10-CM | POA: Diagnosis not present

## 2022-07-20 DIAGNOSIS — K219 Gastro-esophageal reflux disease without esophagitis: Secondary | ICD-10-CM

## 2022-07-20 DIAGNOSIS — L570 Actinic keratosis: Secondary | ICD-10-CM | POA: Diagnosis not present

## 2022-07-20 DIAGNOSIS — L821 Other seborrheic keratosis: Secondary | ICD-10-CM | POA: Diagnosis not present

## 2022-07-20 DIAGNOSIS — L603 Nail dystrophy: Secondary | ICD-10-CM | POA: Diagnosis not present

## 2022-07-22 ENCOUNTER — Other Ambulatory Visit (HOSPITAL_COMMUNITY): Payer: Self-pay

## 2022-07-26 ENCOUNTER — Ambulatory Visit (INDEPENDENT_AMBULATORY_CARE_PROVIDER_SITE_OTHER): Payer: Medicare HMO

## 2022-07-26 VITALS — Ht 66.0 in | Wt 200.0 lb

## 2022-07-26 DIAGNOSIS — Z0000 Encounter for general adult medical examination without abnormal findings: Secondary | ICD-10-CM

## 2022-07-26 NOTE — Progress Notes (Signed)
Subjective:   Amy Hodge is a 67 y.o. female who presents for Medicare Annual (Subsequent) preventive examination.  Review of Systems    Virtual Visit via Telephone Note  I connected with  Amy Hodge on 07/26/22 at  9:30 AM EDT by telephone and verified that I am speaking with the correct person using two identifiers.  Location: Patient: Home Provider: Office Persons participating in the virtual visit: patient/Nurse Health Advisor   I discussed the limitations, risks, security and privacy concerns of performing an evaluation and management service by telephone and the availability of in person appointments. The patient expressed understanding and agreed to proceed.  Interactive audio and video telecommunications were attempted between this nurse and patient, however failed, due to patient having technical difficulties OR patient did not have access to video capability.  We continued and completed visit with audio only.  Some vital signs may be absent or patient reported.   [name], LPN  Cardiac Risk Factors include: advanced age (>22men, >73 women);hypertension     Objective:    Today's Vitals   07/26/22 0934 07/26/22 0935  Weight: 200 lb (90.7 kg)   Height: 5\' 6"  (1.676 m)   PainSc:  0-No pain   Body mass index is 32.28 kg/m.     07/26/2022    9:41 AM 08/23/2019    2:18 PM 08/30/2016    8:18 AM 08/02/2016    8:48 AM 06/28/2016   10:53 AM 05/10/2016    1:21 PM 12/15/2015    9:17 AM  Advanced Directives  Does Patient Have a Medical Advance Directive? Yes No No No No No No  Type of Estate agent of [place];Living will        Copy of Healthcare Power of Attorney in Chart? No - copy requested        Would patient like information on creating a medical advance directive?  No - Patient declined No - Patient declined   Yes (MAU/Ambulatory/Procedural Areas - Information given)     Current Medications (verified) Outpatient Encounter  Medications as of 07/26/2022  Medication Sig   acetaminophen (TYLENOL) 500 MG tablet Take 1,000 mg by mouth every 8 (eight) hours as needed (for pain or headaches).   albuterol (VENTOLIN HFA) 108 (90 Base) MCG/ACT inhaler INHALE 1 TO 2 PUFFS INTO THE LUNGS EVERY 6 HOURS AS NEEDED FOR WHEEZING OR SHORTNESS OF BREATH.   amitriptyline (ELAVIL) 50 MG tablet TAKE 1 TABLET AT BEDTIME   amLODipine (NORVASC) 5 MG tablet TAKE 1 TABLET EVERY DAY   aspirin EC 81 MG tablet Take 81 mg by mouth daily at 6 PM.    B Complex Vitamins (VITAMIN B COMPLEX PO) Take 1 tablet by mouth daily.   budesonide (PULMICORT) 0.25 MG/2ML nebulizer solution Take 2 mLs (0.25 mg total) by nebulization in the morning and at bedtime.   buPROPion (WELLBUTRIN XL) 150 MG 24 hr tablet TAKE 1 TABLET EVERY DAY   chlorpheniramine-HYDROcodone (TUSSIONEX PENNKINETIC ER) 10-8 MG/5ML SUER Take 5 mLs by mouth every 12 (twelve) hours as needed for cough.   chlorpheniramine-HYDROcodone (TUSSIONEX) 10-8 MG/5ML 5 ml every 12 hours if needed for cough   Cholecalciferol (VITAMIN D3) 5000 units CAPS Take 5,000 Units by mouth daily.    cyclobenzaprine (FLEXERIL) 10 MG tablet TAKE 1 TABLET THREE TIMES DAILY AS NEEDED FOR MUSCLE SPASM(S)   DULoxetine (CYMBALTA) 20 MG capsule TAKE 2 CAPSULES EVERY DAY   fexofenadine (ALLEGRA) 180 MG tablet Take 1 tablet (180  mg total) by mouth daily.   hydrOXYzine (ATARAX) 25 MG tablet TAKE 1 TO 2 TABLETS BY MOUTH EVERY 6 TO 8 HOURS AS NEEDED FOR ANXIETY   levothyroxine (SYNTHROID) 150 MCG tablet Take 1 tablet (150 mcg total) by mouth daily before breakfast.   losartan (COZAAR) 50 MG tablet TAKE 1 TABLET EVERY DAY   LYSINE PO Take 2,000 mg by mouth daily.   Multiple Vitamin (MULTIVITAMIN) tablet Take 1 tablet by mouth daily.   nitroGLYCERIN (NITROSTAT) 0.4 MG SL tablet Place 0.4 mg under the tongue every 5 (five) minutes as needed for chest pain.   omeprazole (PRILOSEC) 20 MG capsule TAKE 1 CAPSULE IN THE MORNING AND AT  BEDTIME.   Semaglutide-Weight Management (WEGOVY) 0.25 MG/0.5ML SOAJ Inject 0.25 mg into the skin once a week.   spironolactone (ALDACTONE) 25 MG tablet TAKE 1 TABLET EVERY DAY   No facility-administered encounter medications on file as of 07/26/2022.    Allergies (verified) Bactrim [sulfamethoxazole-trimethoprim], Ketorolac, Anoro ellipta [umeclidinium-vilanterol], Morphine, Sulfamethoxazole, Tetracyclines & related, Trimethoprim, Valsartan, Bystolic [nebivolol hcl], Isosorbide, Levofloxacin, Lisinopril, Metoprolol, Morphine and codeine, Simvastatin, Singulair [montelukast sodium], Statins, Tetracycline, and Toradol [ketorolac tromethamine]   History: Past Medical History:  Diagnosis Date   Allergy    Anxiety    COPD (chronic obstructive pulmonary disease) (HCC)    Coronary artery disease    a. s/p PCI of RCA in 2015 b. cath in 12/2015 showed 100% Ost RPDA with collaterals present   DDD (degenerative disc disease), cervical    Depression    DJD (degenerative joint disease) of knee    RT   GERD (gastroesophageal reflux disease)    Hashimoto's disease    Headache(784.0)    Hyperlipidemia    Hypertension    Hypothyroidism    Myocardial infarction (HCC)    Nocturnal hypoxemia 02/26/2015   On supplemental oxygen therapy    Oxygen 2.5 l/m at bedtime.   Stented coronary artery 02/26/2015   Past Surgical History:  Procedure Laterality Date   ABDOMINAL HYSTERECTOMY     APPENDECTOMY  1971   BUNIONECTOMY Right    CARDIAC CATHETERIZATION N/A 09/02/2014   Procedure: Left Heart Cath and Coronary Angiography;  Surgeon: Yates Decamp, MD;  Location: [facility] INVASIVE CV LAB;  Service: Cardiovascular;  Laterality: N/A;   CARDIAC CATHETERIZATION N/A 12/15/2015   Procedure: Left Heart Cath and Coronary Angiography;  Surgeon: Corky Crafts, MD;  Location: [facility] INVASIVE CV LAB;  Service: Cardiovascular;  Laterality: N/A;   CERVICAL FUSION     CESAREAN SECTION     COLONOSCOPY WITH PROPOFOL N/A  02/24/2015   Procedure: COLONOSCOPY WITH PROPOFOL;  Surgeon: Charolett Bumpers, MD;  Location: [facility] ENDOSCOPY;  Service: Endoscopy;  Laterality: N/A;   ESOPHAGOGASTRODUODENOSCOPY (EGD) WITH PROPOFOL N/A 02/24/2015   Procedure: ESOPHAGOGASTRODUODENOSCOPY (EGD) WITH PROPOFOL;  Surgeon: Charolett Bumpers, MD;  Location: [facility] ENDOSCOPY;  Service: Endoscopy;  Laterality: N/A;   HAND SURGERY     due to trauma-dog bite.   I & D EXTREMITY Bilateral 05/17/2013   Procedure: IRRIGATION AND DEBRIDEMENT EXTREMITY;  Surgeon: Dominica Severin, MD;  Location: [facility] OR;  Service: Orthopedics;  Laterality: Bilateral;   LEFT HEART CATHETERIZATION WITH CORONARY ANGIOGRAM N/A 03/04/2014   Procedure: LEFT HEART CATHETERIZATION WITH CORONARY ANGIOGRAM;  Surgeon: Pamella Pert, MD;  Location: [facility] CATH LAB;  Service: Cardiovascular;  Laterality: N/A;   OOPHORECTOMY     SHOULDER SURGERY     RT   THYROIDECTOMY  1996   TONSILLECTOMY  1965  TOTAL ABDOMINAL HYSTERECTOMY W/ BILATERAL SALPINGOOPHORECTOMY  01/2008   with cervical dysplasia   TUBAL LIGATION  1984   Family History  Problem Relation Age of Onset   Lung cancer Mother    Hypertension Mother    Stroke Mother    Diabetes Mother    Diabetes Father    Hypertension Father    Heart attack Father    Asthma Father    COPD Father    Alcohol abuse Father    Diabetes Sister    Hypertension Sister    Breast cancer Neg Hx    Social History   Socioeconomic History   Marital status: Widowed    Spouse name: Not on file   Number of children: 1   Years of education: Not on file   Highest education level: Not on file  Occupational History   Occupation: disabled    Employer: [employer]  Tobacco Use   Smoking status: Former    Packs/day: 2.00    Years: 25.00    Additional pack years: 0.00    Total pack years: 50.00    Types: Cigarettes    Quit date: 04/23/1996    Years since quitting: 26.2    Passive exposure: Never   Smokeless tobacco: Never  Vaping Use    Vaping Use: Never used  Substance and Sexual Activity   Alcohol use: No    Alcohol/week: 0.0 standard drinks of alcohol   Drug use: No   Sexual activity: Not Currently  Other Topics Concern   Not on file  Social History Narrative   On worker's comp for lower back and right shoulder - applying for disability; losing [employer] insurance 03/07/2010   Social Determinants of Health   Financial Resource Strain: Low Risk  (07/26/2022)   Overall Financial Resource Strain (CARDIA)    Difficulty of Paying Living Expenses: Not hard at all  Food Insecurity: No Food Insecurity (07/26/2022)   Hunger Vital Sign    Worried About Running Out of Food in the Last Year: Never true    Ran Out of Food in the Last Year: Never true  Transportation Needs: No Transportation Needs (07/26/2022)   PRAPARE - Administrator, Civil Service (Medical): No    Lack of Transportation (Non-Medical): No  Physical Activity: Sufficiently Active (07/26/2022)   Exercise Vital Sign    Days of Exercise per Week: 5 days    Minutes of Exercise per Session: 30 min  Stress: No Stress Concern Present (07/26/2022)   [employer] of Occupational Health - Occupational Stress Questionnaire    Feeling of Stress : Not at all  Social Connections: Socially Integrated (07/26/2022)   Social Connection and Isolation Panel [NHANES]    Frequency of Communication with Friends and Family: More than three times a week    Frequency of Social Gatherings with Friends and Family: More than three times a week    Attends Religious Services: More than 4 times per year    Active Member of [employer] or Organizations: Yes    Attends Engineer, structural: More than 4 times per year    Marital Status: Married    Tobacco Counseling Counseling given: Not Answered   Clinical Intake:  Pre-visit preparation completed: Yes  Pain : No/denies pain Pain Score: 0-No pain     BMI - recorded: 32.28 Nutritional Status: BMI > 30   Obese Nutritional Risks: None Diabetes: No  How often do you need to have someone help you when you read instructions,  pamphlets, or other written materials from your doctor or pharmacy?: 1 - Never  Diabetic?  No  Interpreter Needed?: No  Information entered by :: Theresa Mulligan LPN   Activities of Daily Living    07/26/2022    9:40 AM 07/25/2022   12:24 PM  In your present state of health, do you have any difficulty performing the following activities:  Hearing? 0 0  Vision? 0 0  Difficulty concentrating or making decisions? 0 0  Walking or climbing stairs? 0 0  Dressing or bathing? 0 0  Doing errands, shopping? 0 0  Preparing Food and eating ? N N  Using the Toilet? N N  In the past six months, have you accidently leaked urine? N N  Do you have problems with loss of bowel control? N N  Managing your Medications? N N  Managing your Finances? N N  Housekeeping or managing your Housekeeping? N N    Patient Care Team: Melida Quitter, PA as PCP - General (Family Medicine) Jens Som Madolyn Frieze, MD as PCP - Cardiology (Cardiology) Jens Som Madolyn Frieze, MD as Consulting Physician (Cardiology) Waymon Budge, MD as Consulting Physician (Pulmonary Disease) Dominica Severin, MD as Consulting Physician (Orthopedic Surgery) Charolett Bumpers, MD as Consulting Physician (Gastroenterology)  Indicate any recent Medical Services you may have received from other than [facility] providers in the past year (date may be approximate).     Assessment:   This is a routine wellness examination for [place].  Hearing/Vision screen Hearing Screening - Comments:: Denies hearing difficulties   Vision Screening - Comments:: Wears rx glasses - up to date with routine eye exams with  [facility]  Dietary issues and exercise activities discussed: Exercise limited by: None identified   Goals Addressed               This Visit's Progress     Lose weight (pt-stated)        Get off medication.        Depression Screen    07/26/2022    9:38 AM 07/05/2022    1:21 PM 05/23/2022    8:55 AM 01/21/2022    8:34 AM 07/21/2021    8:42 AM 04/27/2021   10:33 AM 03/23/2021    9:12 AM  PHQ 2/9 Scores  PHQ - 2 Score 0 0 0 0 0 0 0  PHQ- 9 Score 0 0  0 0 0 0    Fall Risk    07/26/2022    9:40 AM 07/25/2022   12:24 PM 05/23/2022    8:55 AM 01/21/2022    8:34 AM 07/21/2021    8:42 AM  Fall Risk   Falls in the past year? 0 0 0 0 0  Number falls in past yr: 0  0 0 0  Injury with Fall? 0 0 0 0 0  Risk for fall due to : No Fall Risks  History of fall(s)  No Fall Risks  Follow up Falls prevention discussed    Falls evaluation completed    FALL RISK PREVENTION PERTAINING TO THE HOME:  Any stairs in or around the home? Yes  If so, are there any without handrails? No  Home free of loose throw rugs in walkways, pet beds, electrical cords, etc? Yes  Adequate lighting in your home to reduce risk of falls? Yes   ASSISTIVE DEVICES UTILIZED TO PREVENT FALLS:  Life alert? No  Use of a cane, walker or w/c? No  Grab bars in the  bathroom? No  Shower chair or bench in shower? No  Elevated toilet seat or a handicapped toilet? No   TIMED UP AND GO:  Was the test performed? No . Audio Visit   Cognitive Function:        07/26/2022    9:41 AM 07/21/2021    8:43 AM 01/25/2019    8:08 AM  6CIT Screen  What Year? 0 points 0 points 0 points  What month? 0 points 0 points 0 points  What time? 0 points 0 points 0 points  Count back from 20 0 points 0 points 0 points  Months in reverse 0 points 0 points 0 points  Repeat phrase 0 points 0 points 0 points  Total Score 0 points 0 points 0 points    Immunizations Immunization History  Administered Date(s) Administered   Influenza Split 12/31/2010   Influenza Whole 11/27/2008, 02/16/2010   Influenza,inj,Quad PF,6+ Mos 12/30/2016, 12/26/2017, 12/21/2018, 01/21/2020, 12/07/2020, 12/10/2021   Influenza-Unspecified 05/15/2013, 12/06/2014,  11/05/2015, 12/26/2017   PFIZER(Purple Top)SARS-COV-2 Vaccination 03/16/2020, 04/06/2020   Pneumococcal Conjugate-13 02/15/2013   Pneumococcal Polysaccharide-23 03/07/2012, 01/01/2018   Td 03/07/2004   Tdap 02/26/2015    TDAP status: Up to date  Flu Vaccine status: Up to date    Covid-19 vaccine status: Completed vaccines  Qualifies for Shingles Vaccine? Yes   Zostavax completed No   Shingrix Completed?: No.    Education has been provided regarding the importance of this vaccine. Patient has been advised to call insurance company to determine out of pocket expense if they have not yet received this vaccine. Advised may also receive vaccine at local pharmacy or Health Dept. Verbalized acceptance and understanding.  Screening Tests Health Maintenance  Topic Date Due   COVID-19 Vaccine (3 - Pfizer risk series) 08/11/2022 (Originally 05/04/2020)   Zoster Vaccines- Shingrix (1 of 2) 10/26/2022 (Originally 10/26/1974)   COLONOSCOPY (Pts 45-74yrs Insurance coverage will need to be confirmed)  07/26/2023 (Originally 02/24/2020)   INFLUENZA VACCINE  10/06/2022   Medicare Annual Wellness (AWV)  07/26/2023   MAMMOGRAM  02/05/2024   DTaP/Tdap/Td (3 - Td or Tdap) 02/25/2025   Hepatitis C Screening  Completed   HIV Screening  Completed   HPV VACCINES  Aged Out   PAP SMEAR-Modifier  Discontinued    Health Maintenance  There are no preventive care reminders to display for this patient.   Colorectal cancer screening: Referral to GI placed Deferred. Pt aware the office will call re: appt.  Mammogram status: Completed 02/04/22. Repeat every year    Lung Cancer Screening: (Low Dose CT Chest recommended if Age 30-80 years, 30 pack-year currently smoking OR have quit w/in 15years.)  qualify.     Additional Screening:  Hepatitis C Screening: does qualify; Completed 03/03/15  Vision Screening: Recommended annual ophthalmology exams for early detection of glaucoma and other disorders of  the eye. Is the patient up to date with their annual eye exam?  Yes  Who is the provider or what is the name of the office in which the patient attends annual eye exams? [facility] If pt is not established with a provider, would they like to be referred to a provider to establish care? No .   Dental Screening: Recommended annual dental exams for proper oral hygiene  Community Resource Referral / Chronic Care Management: CRR required this visit?  No   CCM required this visit?  No      Plan:     I have personally reviewed and noted  the following in the patient's chart:   Medical and social history Use of alcohol, tobacco or illicit drugs  Current medications and supplements including opioid prescriptions. Patient is not currently taking opioid prescriptions. Functional ability and status Nutritional status Physical activity Advanced directives List of other physicians Hospitalizations, surgeries, and ER visits in previous 12 months Vitals Screenings to include cognitive, depression, and falls Referrals and appointments  In addition, I have reviewed and discussed with patient certain preventive protocols, quality metrics, and best practice recommendations. A written personalized care plan for preventive services as well as general preventive health recommendations were provided to patient.     [name], LPN   [id number]/61/0960   Nurse Notes:  None

## 2022-07-26 NOTE — Patient Instructions (Addendum)
Amy Hodge , Thank you for taking time to come for your Medicare Wellness Visit. I appreciate your ongoing commitment to your health goals. Please review the following plan we discussed and let me know if I can assist you in the future.   These are the goals we discussed:  Goals       Lose weight (pt-stated)      Get off medication.        This is a list of the screening recommended for you and due dates:  Health Maintenance  Topic Date Due   COVID-19 Vaccine (3 - Pfizer risk series) 08/11/2022*   Zoster (Shingles) Vaccine (1 of 2) 10/26/2022*   Colon Cancer Screening  07/26/2023*   Flu Shot  10/06/2022   Medicare Annual Wellness Visit  07/26/2023   Mammogram  02/05/2024   DTaP/Tdap/Td vaccine (3 - Td or Tdap) 02/25/2025   Hepatitis C Screening: USPSTF Recommendation to screen - Ages 74-79 yo.  Completed   HIV Screening  Completed   HPV Vaccine  Aged Out   Pap Smear  Discontinued  *Topic was postponed. The date shown is not the original due date.    Advanced directives: Please bring a copy of your health care power of attorney and living will to the office to be added to your chart at your convenience.   Conditions/risks identified: None  Next appointment: Follow up in one year for your annual wellness visit.   Preventive Care 40-64 Years, Female Preventive care refers to lifestyle choices and visits with your health care provider that can promote health and wellness. What does preventive care include? A yearly physical exam. This is also called an annual well check. Dental exams once or twice a year. Routine eye exams. Ask your health care provider how often you should have your eyes checked. Personal lifestyle choices, including: Daily care of your teeth and gums. Regular physical activity. Eating a healthy diet. Avoiding tobacco and drug use. Limiting alcohol use. Practicing safe sex. Taking low-dose aspirin daily starting at age 31. Taking vitamin and mineral  supplements as recommended by your health care provider. What happens during an annual well check? The services and screenings done by your health care provider during your annual well check will depend on your age, overall health, lifestyle risk factors, and family history of disease. Counseling  Your health care provider may ask you questions about your: Alcohol use. Tobacco use. Drug use. Emotional well-being. Home and relationship well-being. Sexual activity. Eating habits. Work and work Astron[employer]. Method of birth control. Menstrual cycle. Pregnancy history. Screening  You may have the following tests or measurements: Height, weight, and BMI. Blood pressure. Lipid and cholesterol levels. These may be checked every 5 years, or more frequently if you are over 51 years old. Skin check. Lung cancer screening. You may have this screening every year starting at age 53 if you have a 30-pack-year history of smoking and currently smoke or have quit within the past 15 years. Fecal occult blood test (FOBT) of the stool. You may have this test every year starting at age 63. Flexible sigmoidoscopy or colonoscopy. You may have a sigmoidoscopy every 5 years or a colonoscopy every 10 years starting at age 78. Hepatitis C blood test. Hepatitis B blood test. Sexually transmitted disease (STD) testing. Diabetes screening. This is done by checking your blood sugar (glucose) after you have not eaten for a while (fasting). You may have this done every 1-3 years. Mammogram. This may be done  every 1-2 years. Talk to your health care provider about when you should start having regular mammograms. This may depend on whether you have a family history of breast cancer. BRCA-related cancer screening. This may be done if you have a family history of breast, ovarian, tubal, or peritoneal cancers. Pelvic exam and Pap test. This may be done every 3 years starting at age 44. Starting at age 30, this may be done  every 5 years if you have a Pap test in combination with an HPV test. Bone density scan. This is done to screen for osteoporosis. You may have this scan if you are at high risk for osteoporosis. Discuss your test results, treatment options, and if necessary, the need for more tests with your health care provider. Vaccines  Your health care provider may recommend certain vaccines, such as: Influenza vaccine. This is recommended every year. Tetanus, diphtheria, and acellular pertussis (Tdap, Td) vaccine. You may need a Td booster every 10 years. Zoster vaccine. You may need this after age 74. Pneumococcal 13-valent conjugate (PCV13) vaccine. You may need this if you have certain conditions and were not previously vaccinated. Pneumococcal polysaccharide (PPSV23) vaccine. You may need one or two doses if you smoke cigarettes or if you have certain conditions. Talk to your health care provider about which screenings and vaccines you need and how often you need them. This information is not intended to replace advice given to you by your health care provider. Make sure you discuss any questions you have with your health care provider. Document Released: 03/20/2015 Document Revised: 11/11/2015 Document Reviewed: 12/23/2014 Elsevier Interactive Patient Education  2017 [employer].    Fall Prevention in the Home Falls can cause injuries. They can happen to people of all ages. There are many things you can do to make your home safe and to help prevent falls. What can I do on the outside of my home? Regularly fix the edges of walkways and driveways and fix any cracks. Remove anything that might make you trip as you walk through a door, such as a raised step or threshold. Trim any bushes or trees on the path to your home. Use bright outdoor lighting. Clear any walking paths of anything that might make someone trip, such as rocks or tools. Regularly check to see if handrails are loose or broken. Make  sure that both sides of any steps have handrails. Any raised decks and porches should have guardrails on the edges. Have any leaves, snow, or ice cleared regularly. Use sand or salt on walking paths during winter. Clean up any spills in your garage right away. This includes oil or grease spills. What can I do in the bathroom? Use night lights. Install grab bars by the toilet and in the tub and shower. Do not use towel bars as grab bars. Use non-skid mats or decals in the tub or shower. If you need to sit down in the shower, use a plastic, non-slip stool. Keep the floor dry. Clean up any water that spills on the floor as soon as it happens. Remove soap buildup in the tub or shower regularly. Attach bath mats securely with double-sided non-slip rug tape. Do not have throw rugs and other things on the floor that can make you trip. What can I do in the bedroom? Use night lights. Make sure that you have a light by your bed that is easy to reach. Do not use any sheets or blankets that are too big for your  bed. They should not hang down onto the floor. Have a firm chair that has side arms. You can use this for support while you get dressed. Do not have throw rugs and other things on the floor that can make you trip. What can I do in the kitchen? Clean up any spills right away. Avoid walking on wet floors. Keep items that you use a lot in easy-to-reach places. If you need to reach something above you, use a strong step stool that has a grab bar. Keep electrical cords out of the way. Do not use floor polish or wax that makes floors slippery. If you must use wax, use non-skid floor wax. Do not have throw rugs and other things on the floor that can make you trip. What can I do with my stairs? Do not leave any items on the stairs. Make sure that there are handrails on both sides of the stairs and use them. Fix handrails that are broken or loose. Make sure that handrails are as long as the  stairways. Check any carpeting to make sure that it is firmly attached to the stairs. Fix any carpet that is loose or worn. Avoid having throw rugs at the top or bottom of the stairs. If you do have throw rugs, attach them to the floor with carpet tape. Make sure that you have a light switch at the top of the stairs and the bottom of the stairs. If you do not have them, ask someone to add them for you. What else can I do to help prevent falls? Wear shoes that: Do not have high heels. Have rubber bottoms. Are comfortable and fit you well. Are closed at the toe. Do not wear sandals. If you use a stepladder: Make sure that it is fully opened. Do not climb a closed stepladder. Make sure that both sides of the stepladder are locked into place. Ask someone to hold it for you, if possible. Clearly mark and make sure that you can see: Any grab bars or handrails. First and last steps. Where the edge of each step is. Use tools that help you move around (mobility aids) if they are needed. These include: Canes. Walkers. Scooters. Crutches. Turn on the lights when you go into a dark area. Replace any light bulbs as soon as they burn out. Set up your furniture so you have a clear path. Avoid moving your furniture around. If any of your floors are uneven, fix them. If there are any pets around you, be aware of where they are. Review your medicines with your doctor. Some medicines can make you feel dizzy. This can increase your chance of falling. Ask your doctor what other things that you can do to help prevent falls. This information is not intended to replace advice given to you by your health care provider. Make sure you discuss any questions you have with your health care provider. Document Released: 12/18/2008 Document Revised: 07/30/2015 Document Reviewed: 03/28/2014 Elsevier Interactive Patient Education  2017 [employer].

## 2022-07-28 ENCOUNTER — Other Ambulatory Visit (HOSPITAL_COMMUNITY): Payer: Self-pay

## 2022-08-03 ENCOUNTER — Other Ambulatory Visit: Payer: Medicare HMO

## 2022-08-03 DIAGNOSIS — E669 Obesity, unspecified: Secondary | ICD-10-CM | POA: Diagnosis not present

## 2022-08-03 DIAGNOSIS — I10 Essential (primary) hypertension: Secondary | ICD-10-CM

## 2022-08-03 DIAGNOSIS — E890 Postprocedural hypothyroidism: Secondary | ICD-10-CM | POA: Diagnosis not present

## 2022-08-04 LAB — COMPREHENSIVE METABOLIC PANEL
ALT: 7 IU/L (ref 0–32)
AST: 21 IU/L (ref 0–40)
Albumin/Globulin Ratio: 2.2 (ref 1.2–2.2)
Albumin: 4.7 g/dL (ref 3.9–4.9)
Alkaline Phosphatase: 85 IU/L (ref 44–121)
BUN/Creatinine Ratio: 11 — ABNORMAL LOW (ref 12–28)
BUN: 10 mg/dL (ref 8–27)
Bilirubin Total: <0.2 mg/dL (ref 0.0–1.2)
CO2: 22 mmol/L (ref 20–29)
Calcium: 9.4 mg/dL (ref 8.7–10.3)
Chloride: 101 mmol/L (ref 96–106)
Creatinine, Ser: 0.89 mg/dL (ref 0.57–1.00)
Globulin, Total: 2.1 g/dL (ref 1.5–4.5)
Glucose: 85 mg/dL (ref 70–99)
Potassium: 4.8 mmol/L (ref 3.5–5.2)
Sodium: 138 mmol/L (ref 134–144)
Total Protein: 6.8 g/dL (ref 6.0–8.5)
eGFR: 72 mL/min/{1.73_m2} (ref >59)

## 2022-08-04 LAB — TSH: TSH: 4.75 u[IU]/mL — ABNORMAL HIGH (ref 0.450–4.500)

## 2022-08-04 LAB — T3, FREE: T3, Free: 2.6 pg/mL (ref 2.0–4.4)

## 2022-08-04 LAB — T4, FREE: Free T4: 1.57 ng/dL (ref 0.82–1.77)

## 2022-08-08 DIAGNOSIS — M7130 Other bursal cyst, unspecified site: Secondary | ICD-10-CM | POA: Diagnosis not present

## 2022-08-09 ENCOUNTER — Encounter: Payer: Self-pay | Admitting: Family Medicine

## 2022-08-09 ENCOUNTER — Ambulatory Visit (INDEPENDENT_AMBULATORY_CARE_PROVIDER_SITE_OTHER): Payer: Medicare HMO | Admitting: Family Medicine

## 2022-08-09 ENCOUNTER — Other Ambulatory Visit (HOSPITAL_COMMUNITY): Payer: Self-pay

## 2022-08-09 VITALS — BP 125/85 | HR 85 | Resp 18 | Ht 66.0 in | Wt 197.0 lb

## 2022-08-09 DIAGNOSIS — R300 Dysuria: Secondary | ICD-10-CM | POA: Diagnosis not present

## 2022-08-09 DIAGNOSIS — E669 Obesity, unspecified: Secondary | ICD-10-CM | POA: Diagnosis not present

## 2022-08-09 LAB — POCT URINALYSIS DIPSTICK
Bilirubin, UA: NEGATIVE
Blood, UA: NEGATIVE
Glucose, UA: NEGATIVE
Ketones, UA: NEGATIVE
Leukocytes, UA: NEGATIVE
Nitrite, UA: NEGATIVE
Protein, UA: NEGATIVE
Spec Grav, UA: 1.015 (ref 1.010–1.025)
Urobilinogen, UA: 0.2 E.U./dL
pH, UA: 6.5 (ref 5.0–8.0)

## 2022-08-09 MED ORDER — WEGOVY 0.5 MG/0.5ML ~~LOC~~ SOAJ
0.5000 mg | SUBCUTANEOUS | 1 refills | Status: DC
Start: 2022-08-09 — End: 2022-09-06
  Filled 2022-08-09: qty 2, 28d supply, fill #0

## 2022-08-09 MED ORDER — FLUCONAZOLE 150 MG PO TABS
150.0000 mg | ORAL_TABLET | Freq: Once | ORAL | 0 refills | Status: AC
Start: 2022-08-09 — End: 2022-08-09

## 2022-08-09 NOTE — Patient Instructions (Signed)
Once you have your colonoscopy and Shingles shots, you will be completely up to date on your preventative care! Renee Rival!

## 2022-08-09 NOTE — Assessment & Plan Note (Signed)
Increase to Wegovy 0.5 mg weekly.  Continue with nutritional and lifestyle interventions to support weight loss as well.  Will follow-up in about 4 weeks and may consider increasing Wegovy again at that point.

## 2022-08-09 NOTE — Progress Notes (Signed)
Established Patient Office Visit  Subjective   Patient ID: Amy Hodge, female    DOB: [birth year]  Age: 67 y.o. MRN: [id number]  Chief Complaint  Patient presents with   Weight Management Screening   Urinary Tract Infection         HPI Amy Hodge is a 67 y.o. female presenting today for follow up of weight management.  She also has concern for possible UTI. Weight management: Weight has decreased 3 lbs since last visit.  She is currently taking Wegovy 0.25 mg weekly.  She is tolerating it well and denies any side effects. Urinary Tract Infection: Patient complains of burning with urination, frequency, pain in the lower abdomen, and urgency She has had symptoms for 5 days. Patient denies fever, headache, and vaginal discharge.  She has been drinking a lot of water, cranberry juice, and using Azo with minimal improvement in symptoms.  ROS Negative unless otherwise noted in HPI   Objective:     BP 125/85 (BP Location: Left Arm, Patient Position: Sitting, Cuff Size: Normal)   Pulse 85   Resp 18   Ht 5\' 6"  (1.676 m)   Wt 197 lb (89.4 kg)   SpO2 92%   BMI 31.80 kg/m   Physical Exam Constitutional:      General: She is not in acute distress.    Appearance: Normal appearance.  HENT:     Head: Normocephalic and atraumatic.  Pulmonary:     Effort: Pulmonary effort is normal. No respiratory distress.  Musculoskeletal:     Cervical back: Normal range of motion.  Neurological:     General: No focal deficit present.     Mental Status: She is alert and oriented to person, place, and time. Mental status is at baseline.  Psychiatric:        Mood and Affect: Mood normal.        Thought Content: Thought content normal.        Judgment: Judgment normal.    Results for orders placed or performed in visit on 08/09/22  POCT Urinalysis Dipstick  Result Value Ref Range   Color, UA Yellow    Clarity, UA Clear    Glucose, UA Negative Negative   Bilirubin, UA Negative     Ketones, UA Negative    Spec Grav, UA 1.015 1.010 - 1.025   Blood, UA Negative    pH, UA 6.5 5.0 - 8.0   Protein, UA Negative Negative   Urobilinogen, UA 0.2 0.2 or 1.0 E.U./dL   Nitrite, UA Negative    Leukocytes, UA Negative Negative   Appearance     Odor       Assessment & Plan:  Obesity, Class I, BMI 30-34.9 Assessment & Plan: Increase to Wegovy 0.5 mg weekly.  Continue with nutritional and lifestyle interventions to support weight loss as well.  Will follow-up in about 4 weeks and may consider increasing Wegovy again at that point.  Orders: -     Wegovy; Inject 0.5 mg (0.5 ml) into the skin once a week.  Dispense: 2 mL; Refill: 1  Dysuria -     POCT urinalysis dipstick -     Fluconazole; Take 1 tablet (150 mg total) by mouth once for 1 dose.  Dispense: 1 tablet; Refill: 0  UA negative for UTI, patient has no concern for STIs.  Remaining DDx includes candidiasis, BV.  Starting trial of fluconazole to treat for candidiasis, if ineffective then we may consider a swab to check  for BV and other infections.  Return in about 4 weeks (around 09/06/2022) for follow-up for weight management, increased Wegovy to 0.5 mg weekly.    Melida Quitter, PA

## 2022-08-16 ENCOUNTER — Emergency Department (HOSPITAL_COMMUNITY): Payer: Medicare HMO

## 2022-08-16 ENCOUNTER — Other Ambulatory Visit: Payer: Self-pay

## 2022-08-16 ENCOUNTER — Emergency Department (HOSPITAL_COMMUNITY)
Admission: EM | Admit: 2022-08-16 | Discharge: 2022-08-16 | Disposition: A | Discharge status: Home / Self Care | Payer: Medicare HMO | Attending: Emergency Medicine | Admitting: Emergency Medicine

## 2022-08-16 ENCOUNTER — Encounter (HOSPITAL_COMMUNITY): Payer: Self-pay

## 2022-08-16 DIAGNOSIS — Y92094 Garage of other non-institutional residence as the place of occurrence of the external cause: Secondary | ICD-10-CM | POA: Insufficient documentation

## 2022-08-16 DIAGNOSIS — J449 Chronic obstructive pulmonary disease, unspecified: Secondary | ICD-10-CM | POA: Insufficient documentation

## 2022-08-16 DIAGNOSIS — I2510 Atherosclerotic heart disease of native coronary artery without angina pectoris: Secondary | ICD-10-CM | POA: Diagnosis not present

## 2022-08-16 DIAGNOSIS — Z7901 Long term (current) use of anticoagulants: Secondary | ICD-10-CM | POA: Insufficient documentation

## 2022-08-16 DIAGNOSIS — S52602A Unspecified fracture of lower end of left ulna, initial encounter for closed fracture: Secondary | ICD-10-CM | POA: Insufficient documentation

## 2022-08-16 DIAGNOSIS — S52502A Unspecified fracture of the lower end of left radius, initial encounter for closed fracture: Secondary | ICD-10-CM | POA: Diagnosis not present

## 2022-08-16 DIAGNOSIS — Z7982 Long term (current) use of aspirin: Secondary | ICD-10-CM | POA: Diagnosis not present

## 2022-08-16 DIAGNOSIS — S52592A Other fractures of lower end of left radius, initial encounter for closed fracture: Secondary | ICD-10-CM | POA: Diagnosis not present

## 2022-08-16 DIAGNOSIS — Z79899 Other long term (current) drug therapy: Secondary | ICD-10-CM | POA: Diagnosis not present

## 2022-08-16 DIAGNOSIS — W19XXXA Unspecified fall, initial encounter: Secondary | ICD-10-CM

## 2022-08-16 DIAGNOSIS — W010XXA Fall on same level from slipping, tripping and stumbling without subsequent striking against object, initial encounter: Secondary | ICD-10-CM | POA: Insufficient documentation

## 2022-08-16 DIAGNOSIS — M25532 Pain in left wrist: Secondary | ICD-10-CM | POA: Diagnosis present

## 2022-08-16 DIAGNOSIS — I10 Essential (primary) hypertension: Secondary | ICD-10-CM | POA: Diagnosis not present

## 2022-08-16 DIAGNOSIS — S52252A Displaced comminuted fracture of shaft of ulna, left arm, initial encounter for closed fracture: Secondary | ICD-10-CM | POA: Diagnosis not present

## 2022-08-16 DIAGNOSIS — S52352A Displaced comminuted fracture of shaft of radius, left arm, initial encounter for closed fracture: Secondary | ICD-10-CM | POA: Diagnosis not present

## 2022-08-16 MED ORDER — FENTANYL CITRATE PF 50 MCG/ML IJ SOSY
100.0000 ug | PREFILLED_SYRINGE | Freq: Once | INTRAMUSCULAR | Status: AC
  Administered 2022-08-16: 100 ug via INTRAVENOUS
  Filled 2022-08-16: qty 2

## 2022-08-16 MED ORDER — HYDROCODONE-ACETAMINOPHEN 5-325 MG PO TABS: 1.0000 | ORAL_TABLET | Freq: Once | ORAL | Status: DC

## 2022-08-16 MED ORDER — PROPOFOL 10 MG/ML IV BOLUS
INTRAVENOUS | Status: AC | PRN
  Administered 2022-08-16: 110 mg via INTRAVENOUS

## 2022-08-16 MED ORDER — BUPIVACAINE HCL 0.5 % IJ SOLN: 50.0000 mL | Freq: Once | INTRAMUSCULAR | Status: DC

## 2022-08-16 MED ORDER — LACTATED RINGERS IV BOLUS
1000.0000 mL | Freq: Once | INTRAVENOUS | Status: AC
  Administered 2022-08-16: 1000 mL via INTRAVENOUS

## 2022-08-16 MED ORDER — HYDROMORPHONE HCL 1 MG/ML IJ SOLN
1.0000 mg | Freq: Once | INTRAMUSCULAR | Status: AC
  Administered 2022-08-16: 1 mg via INTRAVENOUS
  Filled 2022-08-16: qty 1

## 2022-08-16 MED ORDER — PROPOFOL 10 MG/ML IV BOLUS
150.0000 mg | Freq: Once | INTRAVENOUS | Status: AC
  Administered 2022-08-16: 110 mg via INTRAVENOUS
  Filled 2022-08-16: qty 20

## 2022-08-16 MED ORDER — ONDANSETRON 4 MG PO TBDP: 4.0000 mg | ORAL_TABLET | Freq: Three times a day (TID) | ORAL | 0 refills | Status: DC | PRN

## 2022-08-16 MED ORDER — BUPIVACAINE HCL (PF) 0.5 % IJ SOLN
30.0000 mL | Freq: Once | INTRAMUSCULAR | Status: AC
  Administered 2022-08-16: 20 mL
  Filled 2022-08-16: qty 30

## 2022-08-16 MED ORDER — ONDANSETRON 4 MG PO TBDP: 4.0000 mg | ORAL_TABLET | Freq: Once | ORAL | Status: DC

## 2022-08-16 MED ORDER — ONDANSETRON HCL 4 MG/2ML IJ SOLN
4.0000 mg | Freq: Once | INTRAMUSCULAR | Status: AC
  Administered 2022-08-16: 4 mg via INTRAVENOUS
  Filled 2022-08-16: qty 2

## 2022-08-16 MED ORDER — HYDROCODONE-ACETAMINOPHEN 5-325 MG PO TABS: 1.0000 | ORAL_TABLET | Freq: Four times a day (QID) | ORAL | 0 refills | Status: DC | PRN

## 2022-08-16 NOTE — ED Provider Notes (Signed)
[place] EMERGENCY DEPARTMENT AT [facility] Provider Note   CSN: [id number] Arrival date & time: 08/16/22  1142     History  Chief Complaint  Patient presents with   Fall   Wrist Pain    Amy Hodge is a 67 y.o. female.  With PMH of HTN, HLD, COPD, CAD on aspirin no anticoagulation presents after trip and fall in her garage with no head injury or LOC but left wrist pain and deformity.  She was in her garage cleaning some things up when she tripped over a jack sticking out in the garage.  She fell directly onto her extended left arm.  She has pain in her left wrist as well as numbness and tingling going down her middle fingers.  She is unable to move her fingers much due to severe pain.  She had no head injury or other reported injuries.   Fall  Wrist Pain       Home Medications Prior to Admission medications   Medication Sig Start Date End Date Taking? Authorizing Provider  HYDROcodone-acetaminophen (NORCO/VICODIN) 5-325 MG tablet Take 1 tablet by mouth every 6 (six) hours as needed for up to 10 doses for severe pain. 08/16/22  Yes Mardene Sayer, MD  ondansetron (ZOFRAN-ODT) 4 MG disintegrating tablet Take 1 tablet (4 mg total) by mouth every 8 (eight) hours as needed. 08/16/22  Yes Mardene Sayer, MD  acetaminophen (TYLENOL) 500 MG tablet Take 1,000 mg by mouth every 8 (eight) hours as needed (for pain or headaches).    [provider]  albuterol (VENTOLIN HFA) 108 (90 Base) MCG/ACT inhaler INHALE 1 TO 2 PUFFS INTO THE LUNGS EVERY 6 HOURS AS NEEDED FOR WHEEZING OR SHORTNESS OF BREATH. 07/08/22   Melida Quitter, PA  amitriptyline (ELAVIL) 50 MG tablet TAKE 1 TABLET AT BEDTIME 07/15/22   Saralyn Pilar A, PA  amLODipine (NORVASC) 5 MG tablet TAKE 1 TABLET EVERY DAY 02/17/22   Lewayne Bunting, MD  aspirin EC 81 MG tablet Take 81 mg by mouth daily at 6 PM.     [provider]  B Complex Vitamins (VITAMIN B COMPLEX PO) Take 1 tablet by  mouth daily.    [provider]  budesonide (PULMICORT) 0.25 MG/2ML nebulizer solution Take 2 mLs (0.25 mg total) by nebulization in the morning and at bedtime. 09/04/20   Glenford Bayley, NP  buPROPion (WELLBUTRIN XL) 150 MG 24 hr tablet TAKE 1 TABLET EVERY DAY 07/15/22   Melida Quitter, PA  chlorpheniramine-HYDROcodone (TUSSIONEX PENNKINETIC ER) 10-8 MG/5ML SUER Take 5 mLs by mouth every 12 (twelve) hours as needed for cough. 12/07/20   Jetty Duhamel D, MD  chlorpheniramine-HYDROcodone (TUSSIONEX) 10-8 MG/5ML 5 ml every 12 hours if needed for cough 06/01/22   Jetty Duhamel D, MD  Cholecalciferol (VITAMIN D3) 5000 units CAPS Take 5,000 Units by mouth daily.     [provider]  cyclobenzaprine (FLEXERIL) 10 MG tablet TAKE 1 TABLET THREE TIMES DAILY AS NEEDED FOR MUSCLE SPASM(S) 10/22/21   Mayer Masker, PA-C  DULoxetine (CYMBALTA) 20 MG capsule TAKE 2 CAPSULES EVERY DAY 12/27/21   Abonza, Maritza, PA-C  fexofenadine (ALLEGRA) 180 MG tablet Take 1 tablet (180 mg total) by mouth daily. 11/13/17   Opalski, Deborah, DO  hydrOXYzine (ATARAX) 25 MG tablet TAKE 1 TO 2 TABLETS BY MOUTH EVERY 6 TO 8 HOURS AS NEEDED FOR ANXIETY 05/23/22   Melida Quitter, PA  levothyroxine (SYNTHROID) 150 MCG tablet Take  1 tablet (150 mcg total) by mouth daily before breakfast. 05/23/22 07/22/22  Saralyn Pilar A, PA  losartan (COZAAR) 50 MG tablet TAKE 1 TABLET EVERY DAY 01/18/22   Lewayne Bunting, MD  LYSINE PO Take 2,000 mg by mouth daily.    [provider]  Multiple Vitamin (MULTIVITAMIN) tablet Take 1 tablet by mouth daily.    [provider]  nitroGLYCERIN (NITROSTAT) 0.4 MG SL tablet Place 0.4 mg under the tongue every 5 (five) minutes as needed for chest pain.    [provider]  omeprazole (PRILOSEC) 20 MG capsule TAKE 1 CAPSULE IN THE MORNING AND AT BEDTIME. 07/20/22   Melida Quitter, PA  Semaglutide-Weight Management (WEGOVY) 0.5 MG/0.5ML SOAJ Inject 0.5 mg (0.5  ml) into the skin once a week. 08/09/22   Melida Quitter, PA  spironolactone (ALDACTONE) 25 MG tablet TAKE 1 TABLET EVERY DAY 07/15/22   Saralyn Pilar A, PA      Allergies    Bactrim [sulfamethoxazole-trimethoprim], Ketorolac, Anoro ellipta [umeclidinium-vilanterol], Morphine, Sulfamethoxazole, Tetracyclines & related, Trimethoprim, Valsartan, Bystolic [nebivolol hcl], Isosorbide, Levofloxacin, Lisinopril, Metoprolol, Morphine and codeine, Simvastatin, Singulair [montelukast sodium], Statins, Tetracycline, and Toradol [ketorolac tromethamine]    Review of Systems   Review of Systems  Physical Exam Updated Vital Signs BP 131/84 (BP Location: Right Arm)   Pulse 85   Temp 97.9 F (36.6 C) (Oral)   Resp 17   Ht 5\' 6"  (1.676 m)   Wt 89.4 kg   SpO2 95%   BMI 31.80 kg/m  Physical Exam Constitutional: Alert and oriented.  Uncomfortable and in pain but no acute distress Eyes: Conjunctivae are normal. ENT      Head: Normocephalic and atraumatic. Cardiovascular: S1, S2, regular rate, dopplered radial pulse of left arm, capillary refill 3 seconds of left hand Respiratory: Normal respiratory effort. Breath sounds are normal.  O2 sat 98 on RA Gastrointestinal: Soft and nontender.  Musculoskeletal: Dorsal displacement and deformity of left distal radius and ulna.  Patient with severe tenderness to palpation of distal radius and ulna.  No pain or deformity of elbow or proximal radius and ulna.  Able to slowly wiggle fingers but significantly limited range of motion due to pain of hand.  Decreased sensation of mid 3 fingers. Neurologic: Normal speech and language.  AAOx4.  GCS 15. Skin: Skin is warm, dry and intact. No rash noted. Psychiatric: Mood and affect are normal. Speech and behavior are normal.  ED Results / Procedures / Treatments   Labs (all labs ordered are listed, but only abnormal results are displayed) Labs Reviewed - No data to display  EKG None  Radiology DG Wrist  Complete Left  Result Date: 08/16/2022 CLINICAL DATA:  Pain after fall EXAM: LEFT WRIST - COMPLETE 3 VIEW; LEFT FOREARM - 2 VIEW COMPARISON:  None Available. FINDINGS: Transverse distal radial and ulnar metaphyseal fractures. There is severe displacement posterior of the main distal fracture fragments with some overriding and angulation. Fracture lines. Extend to the radiocarpal joint and distal radioulnar joint. No additional fracture or dislocation. Osteopenia. Advanced degenerative changes of the first carpometacarpal joint. IMPRESSION: Comminuted distal radioulnar fractures with significant displacement and angulation. There is overriding of fracture fragments. Fracture lines appear to involve the distal radioulnar joint in the radiocarpal joint. Advanced degenerative changes of the first carpometacarpal joint Electronically Signed   By: Karen Kays M.D.   On: 08/16/2022 13:11   DG Forearm Left  Result Date: 08/16/2022 CLINICAL DATA:  Pain after fall EXAM:  LEFT WRIST - COMPLETE 3 VIEW; LEFT FOREARM - 2 VIEW COMPARISON:  None Available. FINDINGS: Transverse distal radial and ulnar metaphyseal fractures. There is severe displacement posterior of the main distal fracture fragments with some overriding and angulation. Fracture lines. Extend to the radiocarpal joint and distal radioulnar joint. No additional fracture or dislocation. Osteopenia. Advanced degenerative changes of the first carpometacarpal joint. IMPRESSION: Comminuted distal radioulnar fractures with significant displacement and angulation. There is overriding of fracture fragments. Fracture lines appear to involve the distal radioulnar joint in the radiocarpal joint. Advanced degenerative changes of the first carpometacarpal joint Electronically Signed   By: Karen Kays M.D.   On: 08/16/2022 13:11    Procedures .Sedation  Date/Time: 08/16/2022 1:57 PM  Performed by: Mardene Sayer, MD Authorized by: Mardene Sayer, MD    Consent:    Consent obtained:  Written   Consent given by:  Patient   Risks discussed:  Allergic reaction, dysrhythmia, prolonged sedation necessitating reversal, prolonged hypoxia resulting in organ damage, respiratory compromise necessitating ventilatory assistance and intubation, nausea, vomiting and inadequate sedation   Alternatives discussed:  Analgesia without sedation Universal protocol:    Immediately prior to procedure, a time out was called: yes   Pre-sedation assessment:    Time since last food or drink:  600 AM   ASA classification: class 3 - patient with severe systemic disease     Mallampati score:  III - soft palate, base of uvula visible   Pre-sedation assessments completed and reviewed: airway patency, cardiovascular function, hydration status, mental status, nausea/vomiting, pain level, respiratory function and temperature   Immediate pre-procedure details:    Reassessment: Patient reassessed immediately prior to procedure   Procedure details (see MAR for exact dosages):    Total Provider sedation time (minutes):  10 Post-procedure details:    Attendance: Constant attendance by certified staff until patient recovered     Recovery: Patient returned to pre-procedure baseline     Patient tolerance of procedure: tolerated with brief hypopnea and mild hypoxia requiring supplemental nasal canulla and chin thrust for approximately 1.5 minutes.     Medications Ordered in ED Medications  bupivacaine(PF) (MARCAINE) 0.5 % injection 30 mL (has no administration in time range)  propofol (DIPRIVAN) 10 mg/mL bolus/IV push 150 mg (has no administration in time range)  ondansetron (ZOFRAN) injection 4 mg (4 mg Intravenous Given 08/16/22 1201)  fentaNYL (SUBLIMAZE) injection 100 mcg (100 mcg Intravenous Given 08/16/22 1203)  HYDROmorphone (DILAUDID) injection 1 mg (1 mg Intravenous Given 08/16/22 1333)  lactated ringers bolus 1,000 mL (1,000 mLs Intravenous New Bag/Given 08/16/22 1345)     ED Course/ Medical Decision Making/ A&P                             Medical Decision Making  Amy Hodge is a 67 y.o. female.  With PMH of HTN, HLD, COPD, CAD on aspirin no anticoagulation presents after trip and fall in her garage with no head injury or LOC but left wrist pain and deformity.  Patient had mechanical fall with no head injury or LOC or preceding symptoms.  She is not on anticoagulation.  She presented with left distal wrist deformity.  Pulses were dopplered.  She was unable to wiggle fingers or make fist due to pain.  Patient consciously sedated with propofol see procedure note for details.  Charma Igo on-call for hand surgery was involved to perform reduction of distal comminuted radial ulnar  fracture displacement.  She had improved movement and range of motion and pain control after reduction confirmed on postreduction x-ray which I personally reviewed.  Patient return to presedation baseline will go home with friend at bedside.  Provided prescriptions for pain and nausea and information for hand surgeon Dr Kerry Fort to follow up with.  Strict return precautions discussed.  Amount and/or Complexity of Data Reviewed Radiology: ordered.  Risk Prescription drug management.     Final Clinical Impression(s) / ED Diagnoses Final diagnoses:  Fall, initial encounter  Displaced fracture of distal end of left radius  Displaced fracture of distal end of left ulna    Rx / DC Orders ED Discharge Orders          Ordered    HYDROcodone-acetaminophen (NORCO/VICODIN) 5-325 MG tablet  Every 6 hours PRN        08/16/22 1431    ondansetron (ZOFRAN-ODT) 4 MG disintegrating tablet  Every 8 hours PRN        08/16/22 1431              Mardene Sayer, MD 08/16/22 1437

## 2022-08-16 NOTE — Procedures (Signed)
Procedure: Left wrist closed reduction   Indication: Left wrist fracture   Surgeon: Charma Igo, PA-C   Assist: None   Anesthesia: Propofol via EDP   EBL: None   Complications: None   Findings: After risks/benefits explained patient desires to undergo procedure. Consent obtained and time out performed. Sedation given and confirmed. Wrist reduced and splinted. Pt tolerated the procedure well.       Freeman Caldron, PA-C Orthopedic Surgery [phone]

## 2022-08-16 NOTE — Discharge Instructions (Addendum)
You were seen after a fall and found to have a broken and displaced wrist.  We did give you sedation in the ER to help put your wrist back into place.  Call Dr. Virgina Jock office to make your scheduled follow-up appointment for your broken wrist.  SPLINT INSTRUCTIONS   WEIGHT BEARING Your weight bearing status is: no weight bearing of the left arm   This will need to be maintained until you are seen by in the orthopedic clinic.     SPLINT/CAST CARE DO NOT REMOVE: Please do NOT remove your splint/cast.  You should keep your dressing clean, dry, and intact. Showering should be done with a plastic bag over your cast/splint and securely taped above your cast/splint. Ice can help with pain. However, avoid getting your splint/cast wet.  Swelling and bruising can be normal after an injury.  Keep your extremity elevated as much as possible this will help decrease your swelling and improve your pain.  If any of the below should occur, please call the orthopedist or go to a local ED. Soaking through of dressing/splint Pain unable to be controlled by prescribed pain medication  MEDICATIONS Multimodal Pain Medication  The most important aspect of pain management is elevation of your injured extremity above the level of your heart with pillows, blankets, etc. Swelling can account for a large amount of your pain.  We recommend multimodal pain control, these medications are used to provide you with as optimal pain control as possible. Most pain can be well managed with Tylenol.  Scheduled tylenol 1000 mg of tylenol every 8 hours (unless contraindicated due to liver problems) Ibuprofen: 600 mg every 8 hours as needed for pain (unless contraindicated due to kidney disease, GI bleed, etc) Do not take on an empty stomach. If you have blood in your stool or significant abdominal pain, stop taking medication immediately and call your PCP or go to the ED.  Norco (hydrocodone-acetaminophen) every 6 hours as  needed for pain if other medications are not providing relief.  Given that [facility] only allows providers to prescribe a limited amount of narcotics, we recommend that narcotics are used sparingly if you have been prescribed any

## 2022-08-16 NOTE — Consult Note (Signed)
Reason for Consult:Left wrist fx Referring Physician: Vivien Rossetti Time called: 1306 Time at bedside: 1339   Amy Hodge is an 67 y.o. female.  HPI: Aleska was in her garage and tripped over a lift. She put her hand out to break her fall and had immediate pain and deformity. She was brought to the ED were x-rays showed a left wrist fx and hand surgery was consulted. She is RHD, does not work, lives at home alone, and does not use any assistive devices.  Past Medical History:  Diagnosis Date   Allergy    Anxiety    COPD (chronic obstructive pulmonary disease) (HCC)    Coronary artery disease    a. s/p PCI of RCA in 2015 b. cath in 12/2015 showed 100% Ost RPDA with collaterals present   DDD (degenerative disc disease), cervical    Depression    DJD (degenerative joint disease) of knee    RT   GERD (gastroesophageal reflux disease)    Hashimoto's disease    Headache(784.0)    Hyperlipidemia    Hypertension    Hypothyroidism    Myocardial infarction (HCC)    Nocturnal hypoxemia 02/26/2015   On supplemental oxygen therapy    Oxygen 2.5 l/m at bedtime.   Stented coronary artery 02/26/2015    Past Surgical History:  Procedure Laterality Date   ABDOMINAL HYSTERECTOMY     APPENDECTOMY  1971   BUNIONECTOMY Right    CARDIAC CATHETERIZATION N/A 09/02/2014   Procedure: Left Heart Cath and Coronary Angiography;  Surgeon: Yates Decamp, MD;  Location: [facility] INVASIVE CV LAB;  Service: Cardiovascular;  Laterality: N/A;   CARDIAC CATHETERIZATION N/A 12/15/2015   Procedure: Left Heart Cath and Coronary Angiography;  Surgeon: Corky Crafts, MD;  Location: [facility] INVASIVE CV LAB;  Service: Cardiovascular;  Laterality: N/A;   CERVICAL FUSION     CESAREAN SECTION     COLONOSCOPY WITH PROPOFOL N/A 02/24/2015   Procedure: COLONOSCOPY WITH PROPOFOL;  Surgeon: Charolett Bumpers, MD;  Location: [facility] ENDOSCOPY;  Service: Endoscopy;  Laterality: N/A;   ESOPHAGOGASTRODUODENOSCOPY (EGD) WITH PROPOFOL N/A  02/24/2015   Procedure: ESOPHAGOGASTRODUODENOSCOPY (EGD) WITH PROPOFOL;  Surgeon: Charolett Bumpers, MD;  Location: [facility] ENDOSCOPY;  Service: Endoscopy;  Laterality: N/A;   HAND SURGERY     due to trauma-dog bite.   I & D EXTREMITY Bilateral 05/17/2013   Procedure: IRRIGATION AND DEBRIDEMENT EXTREMITY;  Surgeon: Dominica Severin, MD;  Location: [facility] OR;  Service: Orthopedics;  Laterality: Bilateral;   LEFT HEART CATHETERIZATION WITH CORONARY ANGIOGRAM N/A 03/04/2014   Procedure: LEFT HEART CATHETERIZATION WITH CORONARY ANGIOGRAM;  Surgeon: Pamella Pert, MD;  Location: [facility] CATH LAB;  Service: Cardiovascular;  Laterality: N/A;   OOPHORECTOMY     SHOULDER SURGERY     RT   THYROIDECTOMY  1996   TONSILLECTOMY  1965   TOTAL ABDOMINAL HYSTERECTOMY W/ BILATERAL SALPINGOOPHORECTOMY  01/2008   with cervical dysplasia   TUBAL LIGATION  1984    Family History  Problem Relation Age of Onset   Lung cancer Mother    Hypertension Mother    Stroke Mother    Diabetes Mother    Diabetes Father    Hypertension Father    Heart attack Father    Asthma Father    COPD Father    Alcohol abuse Father    Diabetes Sister    Hypertension Sister    Breast cancer Neg Hx     Social History:  reports that she quit smoking  about 26 years ago. Her smoking use included cigarettes. She has a 50.00 pack-year smoking history. She has never been exposed to tobacco smoke. She has never used smokeless tobacco. She reports that she does not drink alcohol and does not use drugs.  Allergies:  Allergies  Allergen Reactions   Bactrim [Sulfamethoxazole-Trimethoprim] Shortness Of Breath, Anxiety and Palpitations   Ketorolac Shortness Of Breath   Anoro Ellipta [Umeclidinium-Vilanterol] Other (See Comments)    Per patient, caused painful blisters in her mouth.    Morphine Other (See Comments)    Headache    Sulfamethoxazole    Tetracyclines & Related Itching   Trimethoprim    Valsartan Other (See Comments) and Cough     Headaches also    Bystolic [Nebivolol Hcl] Other (See Comments)    Fatigue- Tired feeling   Isosorbide Other (See Comments)    HEADACHES    Levofloxacin Hives   Lisinopril Cough   Metoprolol Other (See Comments)    FATIGUE   Morphine And Codeine Other (See Comments)    Headache    Simvastatin Other (See Comments)    Makes legs hurt/ sore    Singulair [Montelukast Sodium] Palpitations and Cough   Statins Other (See Comments)    MYALGIAS   Tetracycline Hives and Itching   Toradol [Ketorolac Tromethamine] Palpitations    Medications: I have reviewed the patient's current medications.  No results found for this or any previous visit (from the past 48 hour(s)).  DG Wrist Complete Left  Result Date: 08/16/2022 CLINICAL DATA:  Pain after fall EXAM: LEFT WRIST - COMPLETE 3 VIEW; LEFT FOREARM - 2 VIEW COMPARISON:  None Available. FINDINGS: Transverse distal radial and ulnar metaphyseal fractures. There is severe displacement posterior of the main distal fracture fragments with some overriding and angulation. Fracture lines. Extend to the radiocarpal joint and distal radioulnar joint. No additional fracture or dislocation. Osteopenia. Advanced degenerative changes of the first carpometacarpal joint. IMPRESSION: Comminuted distal radioulnar fractures with significant displacement and angulation. There is overriding of fracture fragments. Fracture lines appear to involve the distal radioulnar joint in the radiocarpal joint. Advanced degenerative changes of the first carpometacarpal joint Electronically Signed   By: Karen Kays M.D.   On: 08/16/2022 13:11   DG Forearm Left  Result Date: 08/16/2022 CLINICAL DATA:  Pain after fall EXAM: LEFT WRIST - COMPLETE 3 VIEW; LEFT FOREARM - 2 VIEW COMPARISON:  None Available. FINDINGS: Transverse distal radial and ulnar metaphyseal fractures. There is severe displacement posterior of the main distal fracture fragments with some overriding and angulation.  Fracture lines. Extend to the radiocarpal joint and distal radioulnar joint. No additional fracture or dislocation. Osteopenia. Advanced degenerative changes of the first carpometacarpal joint. IMPRESSION: Comminuted distal radioulnar fractures with significant displacement and angulation. There is overriding of fracture fragments. Fracture lines appear to involve the distal radioulnar joint in the radiocarpal joint. Advanced degenerative changes of the first carpometacarpal joint Electronically Signed   By: Karen Kays M.D.   On: 08/16/2022 13:11    Review of Systems  HENT:  Negative for ear discharge, ear pain, hearing loss and tinnitus.   Eyes:  Negative for photophobia and pain.  Respiratory:  Negative for cough and shortness of breath.   Cardiovascular:  Negative for chest pain.  Gastrointestinal:  Negative for abdominal pain, nausea and vomiting.  Genitourinary:  Negative for dysuria, flank pain, frequency and urgency.  Musculoskeletal:  Positive for arthralgias (Left wrist). Negative for back pain, myalgias and neck pain.  Neurological:  Negative for dizziness and headaches.  Hematological:  Does not bruise/bleed easily.  Psychiatric/Behavioral:  The patient is not nervous/anxious.    Blood pressure (!) 126/93, pulse 85, temperature 99.4 F (37.4 C), temperature source Oral, resp. rate 20, height 5\' 6"  (1.676 m), weight 89.4 kg, SpO2 98 %. Physical Exam Constitutional:      General: She is not in acute distress.    Appearance: She is well-developed. She is not diaphoretic.  HENT:     Head: Normocephalic and atraumatic.  Eyes:     General: No scleral icterus.       Right eye: No discharge.        Left eye: No discharge.     Conjunctiva/sclera: Conjunctivae normal.  Cardiovascular:     Rate and Rhythm: Normal rate and regular rhythm.  Pulmonary:     Effort: Pulmonary effort is normal. No respiratory distress.  Musculoskeletal:     Cervical back: Normal range of motion.      Comments: Left shoulder, elbow, wrist, digits- no skin wounds, severe TTP wrist w/deformity, no instability, no blocks to motion  Sens  Ax/U intact, R/M paresthetic  Mot   Ax/ R/ PIN/ M/ AIN/ U grossly intact  Rad 2+  Skin:    General: Skin is warm and dry.  Neurological:     Mental Status: She is alert.  Psychiatric:        Mood and Affect: Mood normal.        Behavior: Behavior normal.     Assessment/Plan: Left wrist fx -- Plan CR then outpatient f/u with Dr. Kerry Fort later this week. NWB in splint.    Freeman Caldron, PA-C Orthopedic Surgery [phone] 08/16/2022, 1:58 PM

## 2022-08-16 NOTE — Progress Notes (Signed)
RT present for conscious sedation. Pts vitals stable throughout whole procedure.

## 2022-08-16 NOTE — ED Triage Notes (Signed)
Patient tripped over a rug in garage did not hit head but has deformity to left wrist.

## 2022-08-18 DIAGNOSIS — R200 Anesthesia of skin: Secondary | ICD-10-CM | POA: Diagnosis not present

## 2022-08-18 DIAGNOSIS — S52601A Unspecified fracture of lower end of right ulna, initial encounter for closed fracture: Secondary | ICD-10-CM | POA: Diagnosis not present

## 2022-08-18 DIAGNOSIS — S52502A Unspecified fracture of the lower end of left radius, initial encounter for closed fracture: Secondary | ICD-10-CM | POA: Diagnosis not present

## 2022-08-18 DIAGNOSIS — M25532 Pain in left wrist: Secondary | ICD-10-CM | POA: Diagnosis not present

## 2022-08-19 ENCOUNTER — Telehealth: Payer: Self-pay | Admitting: *Deleted

## 2022-08-19 DIAGNOSIS — G8918 Other acute postprocedural pain: Secondary | ICD-10-CM | POA: Diagnosis not present

## 2022-08-19 DIAGNOSIS — G5602 Carpal tunnel syndrome, left upper limb: Secondary | ICD-10-CM | POA: Diagnosis not present

## 2022-08-19 DIAGNOSIS — G5622 Lesion of ulnar nerve, left upper limb: Secondary | ICD-10-CM | POA: Diagnosis not present

## 2022-08-19 DIAGNOSIS — S52202A Unspecified fracture of shaft of left ulna, initial encounter for closed fracture: Secondary | ICD-10-CM | POA: Diagnosis not present

## 2022-08-19 DIAGNOSIS — S52572A Other intraarticular fracture of lower end of left radius, initial encounter for closed fracture: Secondary | ICD-10-CM | POA: Diagnosis not present

## 2022-08-19 DIAGNOSIS — S59002A Unspecified physeal fracture of lower end of ulna, left arm, initial encounter for closed fracture: Secondary | ICD-10-CM | POA: Diagnosis not present

## 2022-08-19 NOTE — Transitions of Care (Post Inpatient/ED Visit) (Signed)
08/19/2022  Name: Amy Hodge MRN: [id number] DOB: March 20, 1955  Today's TOC FU Call Status: Today's TOC FU Call Status:: Successful TOC FU Call Competed TOC FU Call Complete Date: 08/19/22  Transition Care Management Follow-up Telephone Call Date of Discharge: 08/17/22 Discharge Facility: [facility] Type of Discharge: Emergency Department Reason for ED Visit: Other: How have you been since you were released from the hospital?: Better Any questions or concerns?: No  Items Reviewed: Did you receive and understand the discharge instructions provided?: Yes Medications obtained,verified, and reconciled?: Yes (Medications Reviewed) Any new allergies since your discharge?: No Dietary orders reviewed?: No Do you have support at home?: Yes People in Home: alone Name of Support/Comfort Primary Source: Marylene Land  Medications Reviewed Today: Medications Reviewed Today     Reviewed by Luella Cook, RN (Case Manager) on 08/19/22 at 1618  Med List Status: <None>   Medication Order Taking? Sig Documenting Provider Last Dose Status Informant  acetaminophen (TYLENOL) 500 MG tablet [id number] Yes Take 1,000 mg by mouth every 8 (eight) hours as needed (for pain or headaches). [provider] Taking Active Self  albuterol (VENTOLIN HFA) 108 (90 Base) MCG/ACT inhaler [id number] Yes INHALE 1 TO 2 PUFFS INTO THE LUNGS EVERY 6 HOURS AS NEEDED FOR WHEEZING OR SHORTNESS OF BREATH. Melida Quitter, PA Taking Active   amitriptyline (ELAVIL) 50 MG tablet [id number] Yes TAKE 1 TABLET AT BEDTIME Melida Quitter, [place] Taking Active   amLODipine (NORVASC) 5 MG tablet [id number] Yes TAKE 1 TABLET EVERY DAY Crenshaw, Madolyn Frieze, MD Taking Active   aspirin EC 81 MG tablet [id number] Yes Take 81 mg by mouth daily at 6 PM.  [provider] Taking Active Self           Med Note Payton Emerald, CRYSTAL N   Tue Feb 25, 2014 10:46 AM)    B Complex Vitamins (VITAMIN B COMPLEX PO) [id number] Yes Take 1  tablet by mouth daily. [provider] Taking Active Self  budesonide (PULMICORT) 0.25 MG/2ML nebulizer solution [id number] Yes Take 2 mLs (0.25 mg total) by nebulization in the morning and at bedtime. Glenford Bayley, NP Taking Active   buPROPion (WELLBUTRIN XL) 150 MG 24 hr tablet [id number] Yes TAKE 1 TABLET EVERY DAY Melida Quitter, PA Taking Active   chlorpheniramine-HYDROcodone [facility]) 10-8 MG/5ML Kenton Kingfisher [id number] Yes Take 5 mLs by mouth every 12 (twelve) hours as needed for cough. Waymon Budge, MD Taking Active   chlorpheniramine-HYDROcodone (TUSSIONEX) 10-8 MG/5ML [id number] Yes 5 ml every 12 hours if needed for cough Jetty Duhamel D, MD Taking Active   Cholecalciferol (VITAMIN D3) 5000 units CAPS [id number] Yes Take 5,000 Units by mouth daily.  [provider] Taking Active Self  cyclobenzaprine (FLEXERIL) 10 MG tablet [id number] Yes TAKE 1 TABLET THREE TIMES DAILY AS NEEDED FOR MUSCLE SPASM(S) Mayer Masker, PA-C Taking Active   DULoxetine (CYMBALTA) 20 MG capsule [id number] Yes TAKE 2 CAPSULES EVERY DAY Abonza, Maritza, PA-C Taking Active   fexofenadine (ALLEGRA) 180 MG tablet [id number] Yes Take 1 tablet (180 mg total) by mouth daily. Thomasene Lot, DO Taking Active Self  HYDROcodone-acetaminophen (NORCO/VICODIN) 5-325 MG tablet [id number] Yes Take 1 tablet by mouth every 6 (six) hours as needed for up to 10 doses for severe pain. Mardene Sayer, MD Taking Active   hydrOXYzine (ATARAX) 25 MG tablet [id number] Yes TAKE 1 TO 2 TABLETS BY MOUTH EVERY 6 TO 8 HOURS AS NEEDED FOR ANXIETY Melida Quitter, PA Taking  Active   levothyroxine (SYNTHROID) 150 MCG tablet [id number]  Take 1 tablet (150 mcg total) by mouth daily before breakfast. Saralyn Pilar A, PA  Expired 07/22/22 2359   losartan (COZAAR) 50 MG tablet [id number] Yes TAKE 1 TABLET EVERY DAY Lewayne Bunting, MD Taking Active   LYSINE PO [id number] Yes Take 2,000 mg by mouth daily. [provider] Taking Active   Multiple Vitamin (MULTIVITAMIN) tablet [id number] Yes Take 1 tablet by mouth daily. [provider] Taking Active   nitroGLYCERIN (NITROSTAT) 0.4 MG SL tablet [id number] Yes Place 0.4 mg under the tongue every 5 (five) minutes as needed for chest pain. [provider] Taking Active Self           Med Note Excell Seltzer, STACI   Mon Apr 30, 2018  8:16 AM)    omeprazole (PRILOSEC) 20 MG capsule [id number] Yes TAKE 1 CAPSULE IN THE MORNING AND AT BEDTIME. Melida Quitter, PA Taking Active   ondansetron (ZOFRAN-ODT) 4 MG disintegrating tablet [id number] Yes Take 1 tablet (4 mg total) by mouth every 8 (eight) hours as needed. Mardene Sayer, MD Taking Active   Semaglutide-Weight Management [facility]) 0.5 MG/0.5ML Ivory Broad [id number] Yes Inject 0.5 mg (0.5 ml) into the skin once a week. Melida Quitter, PA Taking Active   spironolactone (ALDACTONE) 25 MG tablet [id number] Yes TAKE 1 TABLET EVERY DAY Melida Quitter, PA Taking Active             Home Care and Equipment/Supplies: Were Home Health Services Ordered?: NA Any new equipment or medical supplies ordered?: NA  Functional Questionnaire: Do you need assistance with bathing/showering or dressing?: Yes Do you need assistance with meal preparation?: Yes Do you need assistance with eating?: No Do you have difficulty maintaining continence: No Do you need assistance with getting out of bed/getting out of a chair/moving?: No Do you have difficulty managing or taking your medications?: No  Follow up appointments reviewed: PCP Follow-up appointment confirmed?: Yes Date of PCP follow-up appointment?: 09/06/22 Follow-up Provider: Saralyn Pilar Specialist [facility] Follow-up appointment confirmed?: No Reason Specialist Follow-Up Not Confirmed: Patient has Specialist Provider Number and will Call for Appointment Do you need transportation to your follow-up appointment?: No Do you understand care options  if your condition(s) worsen?: Yes-patient verbalized understanding  SDOH Interventions Today    Flowsheet Row Most Recent Value  SDOH Interventions   Food Insecurity Interventions Intervention Not Indicated  Housing Interventions Intervention Not Indicated  Transportation Interventions Intervention Not Indicated      Interventions Today    Flowsheet Row Most Recent Value  General Interventions   General Interventions Discussed/Reviewed General Interventions Reviewed, Doctor Visits  Doctor Visits Discussed/Reviewed Doctor Visits Discussed, Doctor Visits Reviewed  Pharmacy Interventions   Pharmacy Dicussed/Reviewed Pharmacy Topics Discussed      TOC Interventions Today    Flowsheet Row Most Recent Value  TOC Interventions   TOC Interventions Discussed/Reviewed TOC Interventions Discussed, TOC Interventions Reviewed        Gean Maidens BSN RN [facility] [phone]

## 2022-08-21 HISTORY — PX: WRIST SURGERY: SHX841

## 2022-08-24 ENCOUNTER — Other Ambulatory Visit: Payer: Medicare HMO

## 2022-08-31 DIAGNOSIS — Z4789 Encounter for other orthopedic aftercare: Secondary | ICD-10-CM | POA: Diagnosis not present

## 2022-08-31 DIAGNOSIS — S52502D Unspecified fracture of the lower end of left radius, subsequent encounter for closed fracture with routine healing: Secondary | ICD-10-CM | POA: Diagnosis not present

## 2022-09-06 ENCOUNTER — Other Ambulatory Visit (HOSPITAL_COMMUNITY): Payer: Self-pay

## 2022-09-06 ENCOUNTER — Ambulatory Visit (INDEPENDENT_AMBULATORY_CARE_PROVIDER_SITE_OTHER): Payer: Medicare HMO | Admitting: Family Medicine

## 2022-09-06 ENCOUNTER — Encounter: Payer: Self-pay | Admitting: Family Medicine

## 2022-09-06 DIAGNOSIS — E669 Obesity, unspecified: Secondary | ICD-10-CM | POA: Diagnosis not present

## 2022-09-06 MED ORDER — WEGOVY 0.5 MG/0.5ML ~~LOC~~ SOAJ
0.5000 mg | SUBCUTANEOUS | 1 refills | Status: DC
Start: 2022-09-06 — End: 2022-10-31
  Filled 2022-09-06: qty 2, 28d supply, fill #0
  Filled 2022-10-10: qty 2, 28d supply, fill #1

## 2022-09-06 NOTE — Progress Notes (Signed)
   Established Patient Office Visit  Subjective   Patient ID: Amy Hodge, female    DOB: [birth year]  Age: 67 y.o. MRN: [id number]  Chief Complaint  Patient presents with   Weight Management Screening    HPI Amy Hodge is a 67 y.o. female presenting today for follow up of weight management. Weight management: Weight has decreased 9 lbs since last visit. They have been following a low carb diet prioritizing protein. Exercise routine has been somewhat limited given recent broken bone.  She is currently taking Wegovy 0.5 mg weekly. She would prefer to stay on her current dose as it seems to be working well and she is tolerating it well.  ROS Negative unless otherwise noted in HPI   Objective:     BP 120/87 (BP Location: Right Arm, Patient Position: Sitting, Cuff Size: Normal)   Pulse 91   Resp 20   Ht 5\' 6"  (1.676 m)   Wt 188 lb (85.3 kg)   SpO2 97%   BMI 30.34 kg/m   Physical Exam Constitutional:      General: She is not in acute distress.    Appearance: Normal appearance.  HENT:     Head: Normocephalic and atraumatic.  Cardiovascular:     Rate and Rhythm: Normal rate and regular rhythm.     Heart sounds: No murmur heard.    No friction rub. No gallop.  Pulmonary:     Effort: Pulmonary effort is normal. No respiratory distress.     Breath sounds: No wheezing, rhonchi or rales.  Skin:    General: Skin is warm and dry.  Neurological:     Mental Status: She is alert and oriented to person, place, and time.      Assessment & Plan:  Obesity, Class I, BMI 30-34.9 Assessment & Plan: Continue Wegovy 0.5 mg weekly, refill sent today.  Continue nutritional and lifestyle interventions to support weight loss as well.  Follow-up in 4 weeks, if continuing to use today stable may consider making appointments every 2 months.  Orders: -     Wegovy; Inject 0.5 mg (0.5 ml) into the skin once a week.  Dispense: 2 mL; Refill: 1    Return in about 4 weeks (around  10/04/2022) for follow-up for weight management (in person or video).    Melida Quitter, PA

## 2022-09-06 NOTE — Assessment & Plan Note (Signed)
Continue Wegovy 0.5 mg weekly, refill sent today.  Continue nutritional and lifestyle interventions to support weight loss as well.  Follow-up in 4 weeks, if continuing to use today stable may consider making appointments every 2 months.

## 2022-09-07 DIAGNOSIS — D0461 Carcinoma in situ of skin of right upper limb, including shoulder: Secondary | ICD-10-CM | POA: Diagnosis not present

## 2022-09-19 ENCOUNTER — Inpatient Hospital Stay: Admission: RE | Admit: 2022-09-19 | Payer: Medicare HMO | Source: Ambulatory Visit

## 2022-09-21 DIAGNOSIS — Z4789 Encounter for other orthopedic aftercare: Secondary | ICD-10-CM | POA: Diagnosis not present

## 2022-09-21 DIAGNOSIS — M25532 Pain in left wrist: Secondary | ICD-10-CM | POA: Diagnosis not present

## 2022-09-23 ENCOUNTER — Other Ambulatory Visit: Payer: Self-pay | Admitting: Family Medicine

## 2022-09-23 DIAGNOSIS — E890 Postprocedural hypothyroidism: Secondary | ICD-10-CM

## 2022-09-23 MED ORDER — LEVOTHYROXINE SODIUM 150 MCG PO TABS: 150.0000 ug | ORAL_TABLET | Freq: Every day | ORAL | 0 refills | Status: DC

## 2022-09-28 DIAGNOSIS — M25532 Pain in left wrist: Secondary | ICD-10-CM | POA: Diagnosis not present

## 2022-10-10 ENCOUNTER — Telehealth: Payer: Self-pay

## 2022-10-10 ENCOUNTER — Ambulatory Visit
Admission: RE | Admit: 2022-10-10 | Discharge: 2022-10-10 | Disposition: A | Discharge status: Home / Self Care | Payer: Medicare HMO | Source: Ambulatory Visit | Attending: Cardiology | Admitting: Cardiology

## 2022-10-10 DIAGNOSIS — I712 Thoracic aortic aneurysm, without rupture, unspecified: Secondary | ICD-10-CM

## 2022-10-10 DIAGNOSIS — J439 Emphysema, unspecified: Secondary | ICD-10-CM | POA: Diagnosis not present

## 2022-10-10 DIAGNOSIS — R911 Solitary pulmonary nodule: Secondary | ICD-10-CM | POA: Diagnosis not present

## 2022-10-10 DIAGNOSIS — I7121 Aneurysm of the ascending aorta, without rupture: Secondary | ICD-10-CM | POA: Diagnosis not present

## 2022-10-10 MED ORDER — IOPAMIDOL (ISOVUE-370) INJECTION 76%
75.0000 mL | Freq: Once | INTRAVENOUS | Status: AC | PRN
  Administered 2022-10-10: 75 mL via INTRAVENOUS

## 2022-10-10 NOTE — Telephone Encounter (Signed)
Pt was notified that form will be placed at front desk for pick up.

## 2022-10-10 NOTE — Telephone Encounter (Signed)
Patient dropped off document Handicap Placard, to be filled out by provider. Patient requested to send it back via Call Patient to pick up within 7-days. Document is located in providers tray at front office.Please advise at Mobile [phone] (mobile)

## 2022-10-19 DIAGNOSIS — Z4789 Encounter for other orthopedic aftercare: Secondary | ICD-10-CM | POA: Diagnosis not present

## 2022-10-26 DIAGNOSIS — K219 Gastro-esophageal reflux disease without esophagitis: Secondary | ICD-10-CM | POA: Diagnosis not present

## 2022-10-26 DIAGNOSIS — Z86010 Personal history of colonic polyps: Secondary | ICD-10-CM | POA: Diagnosis not present

## 2022-10-31 ENCOUNTER — Other Ambulatory Visit (HOSPITAL_COMMUNITY): Payer: Self-pay

## 2022-10-31 ENCOUNTER — Encounter: Payer: Self-pay | Admitting: Family Medicine

## 2022-10-31 ENCOUNTER — Other Ambulatory Visit: Payer: Self-pay

## 2022-10-31 ENCOUNTER — Telehealth (INDEPENDENT_AMBULATORY_CARE_PROVIDER_SITE_OTHER): Payer: Medicare HMO | Admitting: Family Medicine

## 2022-10-31 VITALS — Wt 183.0 lb

## 2022-10-31 DIAGNOSIS — Z6829 Body mass index (BMI) 29.0-29.9, adult: Secondary | ICD-10-CM | POA: Diagnosis not present

## 2022-10-31 DIAGNOSIS — E890 Postprocedural hypothyroidism: Secondary | ICD-10-CM | POA: Diagnosis not present

## 2022-10-31 DIAGNOSIS — E669 Obesity, unspecified: Secondary | ICD-10-CM

## 2022-10-31 MED ORDER — LEVOTHYROXINE SODIUM 175 MCG PO TABS
175.0000 ug | ORAL_TABLET | Freq: Every day | ORAL | 1 refills | Status: AC
Start: 2022-10-31 — End: ?
  Filled 2022-10-31: qty 90, 90d supply, fill #0

## 2022-10-31 MED ORDER — WEGOVY 0.5 MG/0.5ML ~~LOC~~ SOAJ
0.5000 mg | SUBCUTANEOUS | 1 refills | Status: DC
Start: 2022-10-31 — End: 2022-12-22
  Filled 2022-10-31: qty 4, 56d supply, fill #0
  Filled 2022-11-07: qty 2, 28d supply, fill #0
  Filled 2022-12-06: qty 2, 28d supply, fill #1

## 2022-10-31 NOTE — Progress Notes (Signed)
Virtual Visit via Video Note  I connected with Amy Hodge on 10/31/22 at 10:50 AM EDT by a video enabled telemedicine application and verified that I am speaking with the correct person using two identifiers.  Patient Location: Home Provider Location: Office/Clinic  I discussed the limitations, risks, security, and privacy concerns of performing an evaluation and management service by video and the availability of in person appointments. I also discussed with the patient that there may be a patient responsible charge related to this service. The patient expressed understanding and agreed to proceed.    Subjective   Patient ID: Amy Hodge, female    DOB: 15-Jul-1955  Age: 67 y.o. MRN: [id number]  Chief Complaint  Patient presents with   Weight Management Screening    HPI Amy Hodge is a 67 y.o. female presenting today for follow up of weight management. Weight management: Weight has decreased 5 lbs since last visit. They have been following a low-carb diet prioritizing protein. Exercise routine is starting to get back to normal now that her broken bone is healing and she is going through physical therapy for.  She is currently taking Wegovy 0.5 mg weekly, tolerating well and continues to make progress.   ROS Negative unless otherwise noted in HPI  Outpatient Medications Prior to Visit  Medication Sig   acetaminophen (TYLENOL) 500 MG tablet Take 1,000 mg by mouth every 8 (eight) hours as needed (for pain or headaches).   albuterol (VENTOLIN HFA) 108 (90 Base) MCG/ACT inhaler INHALE 1 TO 2 PUFFS INTO THE LUNGS EVERY 6 HOURS AS NEEDED FOR WHEEZING OR SHORTNESS OF BREATH.   amitriptyline (ELAVIL) 50 MG tablet TAKE 1 TABLET AT BEDTIME   amLODipine (NORVASC) 5 MG tablet TAKE 1 TABLET EVERY DAY   aspirin EC 81 MG tablet Take 81 mg by mouth daily at 6 PM.    B Complex Vitamins (VITAMIN B COMPLEX PO) Take 1 tablet by mouth daily.   budesonide (PULMICORT) 0.25 MG/2ML  nebulizer solution Take 2 mLs (0.25 mg total) by nebulization in the morning and at bedtime.   buPROPion (WELLBUTRIN XL) 150 MG 24 hr tablet TAKE 1 TABLET EVERY DAY   chlorpheniramine-HYDROcodone (TUSSIONEX PENNKINETIC ER) 10-8 MG/5ML SUER Take 5 mLs by mouth every 12 (twelve) hours as needed for cough.   chlorpheniramine-HYDROcodone (TUSSIONEX) 10-8 MG/5ML 5 ml every 12 hours if needed for cough   Cholecalciferol (VITAMIN D3) 5000 units CAPS Take 5,000 Units by mouth daily.    cyclobenzaprine (FLEXERIL) 10 MG tablet TAKE 1 TABLET THREE TIMES DAILY AS NEEDED FOR MUSCLE SPASM(S)   DULoxetine (CYMBALTA) 20 MG capsule TAKE 2 CAPSULES EVERY DAY   fexofenadine (ALLEGRA) 180 MG tablet Take 1 tablet (180 mg total) by mouth daily.   hydrOXYzine (ATARAX) 25 MG tablet TAKE 1 TO 2 TABLETS BY MOUTH EVERY 6 TO 8 HOURS AS NEEDED FOR ANXIETY   losartan (COZAAR) 50 MG tablet TAKE 1 TABLET EVERY DAY   LYSINE PO Take 2,000 mg by mouth daily.   Multiple Vitamin (MULTIVITAMIN) tablet Take 1 tablet by mouth daily.   nitroGLYCERIN (NITROSTAT) 0.4 MG SL tablet Place 0.4 mg under the tongue every 5 (five) minutes as needed for chest pain.   omeprazole (PRILOSEC) 20 MG capsule TAKE 1 CAPSULE IN THE MORNING AND AT BEDTIME.   spironolactone (ALDACTONE) 25 MG tablet TAKE 1 TABLET EVERY DAY   [DISCONTINUED] Semaglutide-Weight Management (WEGOVY) 0.5 MG/0.5ML SOAJ Inject 0.5 mg into the skin once a week.   [  DISCONTINUED] HYDROcodone-acetaminophen (NORCO/VICODIN) 5-325 MG tablet Take 1 tablet by mouth every 6 (six) hours as needed for up to 10 doses for severe pain.   [DISCONTINUED] levothyroxine (SYNTHROID) 150 MCG tablet Take 1 tablet (150 mcg total) by mouth daily before breakfast.   [DISCONTINUED] ondansetron (ZOFRAN-ODT) 4 MG disintegrating tablet Take 1 tablet (4 mg total) by mouth every 8 (eight) hours as needed.   No facility-administered medications prior to visit.     Objective:    Wt 183 lb (83 kg)   BMI 29.54  kg/m   Physical Exam General: Speaking clearly in complete sentences without any shortness of breath.  Alert and oriented x3.  Normal judgment. No apparent acute distress.   Assessment & Plan:  Obesity, Class I, BMI 30-34.9 Assessment & Plan: Continue Wegovy 0.5 mg weekly, refill sent today.  Continue nutritional and lifestyle interventions to support weight loss as well.  Follow-up in 2 months for weight management and other chronic conditions with fasting labs 1 week before.  Orders: -     Wegovy; Inject 0.5 mg into the skin once a week.  Dispense: 4 mL; Refill: 1  Postoperative hypothyroidism Assessment & Plan: Continue levothyroxine 175 mcg daily.  We will check thyroid function panel at next follow-up visit.  Orders: -     Levothyroxine Sodium; Take 1 tablet (175 mcg total) by mouth daily.  Dispense: 90 tablet; Refill: 1    Return in about 2 months (around 12/31/2022) for follow-up for HTN, HLD, thyroid, weight management, fasting blood work 1 week before.   I discussed the assessment and treatment plan with the patient. The patient was provided an opportunity to ask questions, and all were answered. The patient agreed with the plan and demonstrated an understanding of the instructions.   The patient was advised to call back or seek an in-person evaluation if the symptoms worsen or if the condition fails to improve as anticipated.  The above assessment and management plan was discussed with the patient. The patient verbalized understanding of and has agreed to the management plan.   Melida Quitter, PA

## 2022-10-31 NOTE — Assessment & Plan Note (Signed)
Continue levothyroxine 175 mcg daily.  We will check thyroid function panel at next follow-up visit.

## 2022-10-31 NOTE — Assessment & Plan Note (Signed)
Continue Wegovy 0.5 mg weekly, refill sent today.  Continue nutritional and lifestyle interventions to support weight loss as well.  Follow-up in 2 months for weight management and other chronic conditions with fasting labs 1 week before.

## 2022-11-03 ENCOUNTER — Other Ambulatory Visit: Payer: Self-pay

## 2022-11-03 ENCOUNTER — Telehealth: Payer: Self-pay

## 2022-11-03 DIAGNOSIS — E890 Postprocedural hypothyroidism: Secondary | ICD-10-CM

## 2022-11-03 MED ORDER — LEVOTHYROXINE SODIUM 175 MCG PO TABS
175.0000 ug | ORAL_TABLET | Freq: Every day | ORAL | 1 refills | Status: DC
Start: 2022-11-03 — End: 2023-03-30

## 2022-11-03 NOTE — Telephone Encounter (Signed)
Please resend levothyroxine (SYNTHROID) 175 MCG tablet  to [facility]

## 2022-11-03 NOTE — Telephone Encounter (Signed)
Script sent  

## 2022-11-07 ENCOUNTER — Other Ambulatory Visit (HOSPITAL_COMMUNITY): Payer: Self-pay

## 2022-11-08 ENCOUNTER — Other Ambulatory Visit (HOSPITAL_COMMUNITY): Payer: Self-pay

## 2022-11-09 ENCOUNTER — Other Ambulatory Visit (HOSPITAL_COMMUNITY): Payer: Self-pay

## 2022-11-10 NOTE — Progress Notes (Signed)
HPI: FU CAD. Pt had NSTEMI 3/15 with complicated PCI of RCA (performed in [facility]). ABI 12/15 normal. Cath 6/16 showed 100 PDA, patent stents in proximal and mid RCA, no obstructive disease in the left system; collaterals to the PDA noted; attempt at PCI of CTO unsuccessful. Last cath 10/17 showed 100 PDA (filled with left to right collaterals), patent RCA stent and normal LV function.  Dr Swaziland reviewed and did not think attempt at CTO intervention or CABG was appropriate.  Neck CTA June 2021 showed no significant carotid stenosis. Echo 3/23 showed normal LV function; mild LVH; mild AI; mildly dilated ascending aorta (41 mm). PET scan November 2023 showed mild ischemia in the apical to mid inferior wall and ejection fraction 51%.  CTA August 2024 showed 4 cm ectasia of the ascending aorta, emphysema and stable 5 mm right middle lobe nodule.  H/O cough with ACEI. Myalgias with crestor. Since last seen, she has lost 25 pounds.  She has dyspnea but this has improved.  Occasional rare chest pain unchanged.  No syncope though she does have some dizziness.  She states her blood pressure is running low at times with systolic of 100.  Current Outpatient Medications  Medication Sig Dispense Refill   acetaminophen (TYLENOL) 500 MG tablet Take 1,000 mg by mouth every 8 (eight) hours as needed (for pain or headaches).     albuterol (VENTOLIN HFA) 108 (90 Base) MCG/ACT inhaler INHALE 1 TO 2 PUFFS INTO THE LUNGS EVERY 6 HOURS AS NEEDED FOR WHEEZING OR SHORTNESS OF BREATH. 18 g 3   amitriptyline (ELAVIL) 50 MG tablet TAKE 1 TABLET AT BEDTIME 90 tablet 3   amLODipine (NORVASC) 5 MG tablet TAKE 1 TABLET EVERY DAY 90 tablet 3   aspirin EC 81 MG tablet Take 81 mg by mouth daily at 6 PM.      B Complex Vitamins (VITAMIN B COMPLEX PO) Take 1 tablet by mouth daily.     buPROPion (WELLBUTRIN XL) 150 MG 24 hr tablet TAKE 1 TABLET EVERY DAY 90 tablet 3   chlorpheniramine-HYDROcodone (TUSSIONEX PENNKINETIC ER) 10-8 MG/5ML  SUER Take 5 mLs by mouth every 12 (twelve) hours as needed for cough. 140 mL 0   chlorpheniramine-HYDROcodone (TUSSIONEX) 10-8 MG/5ML 5 ml every 12 hours if needed for cough 115 mL 0   Cholecalciferol (VITAMIN D3) 5000 units CAPS Take 5,000 Units by mouth daily.      cyclobenzaprine (FLEXERIL) 10 MG tablet TAKE 1 TABLET THREE TIMES DAILY AS NEEDED FOR MUSCLE SPASM(S) 30 tablet 1   DULoxetine (CYMBALTA) 20 MG capsule TAKE 2 CAPSULES EVERY DAY 180 capsule 10   fexofenadine (ALLEGRA) 180 MG tablet Take 1 tablet (180 mg total) by mouth daily. 90 tablet 3   hydrOXYzine (ATARAX) 25 MG tablet TAKE 1 TO 2 TABLETS BY MOUTH EVERY 6 TO 8 HOURS AS NEEDED FOR ANXIETY 60 tablet 2   levothyroxine (SYNTHROID) 175 MCG tablet Take 1 tablet (175 mcg total) by mouth daily. 90 tablet 1   losartan (COZAAR) 50 MG tablet TAKE 1 TABLET EVERY DAY 90 tablet 10   LYSINE PO Take 2,000 mg by mouth daily.     Multiple Vitamin (MULTIVITAMIN) tablet Take 1 tablet by mouth daily.     nitroGLYCERIN (NITROSTAT) 0.4 MG SL tablet Place 0.4 mg under the tongue every 5 (five) minutes as needed for chest pain.     omeprazole (PRILOSEC) 20 MG capsule TAKE 1 CAPSULE IN THE MORNING AND AT BEDTIME. 180 capsule 3  Semaglutide-Weight Management (WEGOVY) 0.5 MG/0.5ML SOAJ Inject 0.5 mg into the skin once a week. 4 mL 1   spironolactone (ALDACTONE) 25 MG tablet TAKE 1 TABLET EVERY DAY 90 tablet 3   No current facility-administered medications for this visit.     Past Medical History:  Diagnosis Date   Allergy    Anxiety    COPD (chronic obstructive pulmonary disease) (HCC)    Coronary artery disease    a. s/p PCI of RCA in 2015 b. cath in 12/2015 showed 100% Ost RPDA with collaterals present   DDD (degenerative disc disease), cervical    Depression    DJD (degenerative joint disease) of knee    RT   GERD (gastroesophageal reflux disease)    Hashimoto's disease    Headache(784.0)    Hyperlipidemia    Hypertension     Hypothyroidism    Myocardial infarction (HCC)    Nocturnal hypoxemia 02/26/2015   On supplemental oxygen therapy    Oxygen 2.5 l/m at bedtime.   Stented coronary artery 02/26/2015    Past Surgical History:  Procedure Laterality Date   ABDOMINAL HYSTERECTOMY     APPENDECTOMY  1971   BUNIONECTOMY Right    CARDIAC CATHETERIZATION N/A 09/02/2014   Procedure: Left Heart Cath and Coronary Angiography;  Surgeon: Yates Decamp, MD;  Location: [facility] INVASIVE CV LAB;  Service: Cardiovascular;  Laterality: N/A;   CARDIAC CATHETERIZATION N/A 12/15/2015   Procedure: Left Heart Cath and Coronary Angiography;  Surgeon: Corky Crafts, MD;  Location: [facility] INVASIVE CV LAB;  Service: Cardiovascular;  Laterality: N/A;   CERVICAL FUSION     CESAREAN SECTION     COLONOSCOPY WITH PROPOFOL N/A 02/24/2015   Procedure: COLONOSCOPY WITH PROPOFOL;  Surgeon: Charolett Bumpers, MD;  Location: [facility] ENDOSCOPY;  Service: Endoscopy;  Laterality: N/A;   ESOPHAGOGASTRODUODENOSCOPY (EGD) WITH PROPOFOL N/A 02/24/2015   Procedure: ESOPHAGOGASTRODUODENOSCOPY (EGD) WITH PROPOFOL;  Surgeon: Charolett Bumpers, MD;  Location: [facility] ENDOSCOPY;  Service: Endoscopy;  Laterality: N/A;   HAND SURGERY     due to trauma-dog bite.   I & D EXTREMITY Bilateral 05/17/2013   Procedure: IRRIGATION AND DEBRIDEMENT EXTREMITY;  Surgeon: Dominica Severin, MD;  Location: [facility] OR;  Service: Orthopedics;  Laterality: Bilateral;   LEFT HEART CATHETERIZATION WITH CORONARY ANGIOGRAM N/A 03/04/2014   Procedure: LEFT HEART CATHETERIZATION WITH CORONARY ANGIOGRAM;  Surgeon: Pamella Pert, MD;  Location: [facility] CATH LAB;  Service: Cardiovascular;  Laterality: N/A;   OOPHORECTOMY     SHOULDER SURGERY     RT   THYROIDECTOMY  1996   TONSILLECTOMY  1965   TOTAL ABDOMINAL HYSTERECTOMY W/ BILATERAL SALPINGOOPHORECTOMY  01/2008   with cervical dysplasia   TUBAL LIGATION  1984    Social History   Socioeconomic History   Marital status: Widowed    Spouse name: Not on  file   Number of children: 1   Years of education: Not on file   Highest education level: Not on file  Occupational History   Occupation: disabled    Employer: [employer]  Tobacco Use   Smoking status: Former    Current packs/day: 0.00    Average packs/day: 2.0 packs/day for 25.0 years (50.0 ttl pk-yrs)    Types: Cigarettes    Start date: 04/24/1971    Quit date: 04/23/1996    Years since quitting: 26.5    Passive exposure: Never   Smokeless tobacco: Never  Vaping Use   Vaping status: Never Used  Substance and Sexual Activity  Alcohol use: No    Alcohol/week: 0.0 standard drinks of alcohol   Drug use: No   Sexual activity: Not Currently  Other Topics Concern   Not on file  Social History Narrative   On worker's comp for lower back and right shoulder - applying for disability; losing [employer] insurance 03/07/2010   Social Determinants of Health   Financial Resource Strain: Low Risk  (07/26/2022)   Overall Financial Resource Strain (CARDIA)    Difficulty of Paying Living Expenses: Not hard at all  Food Insecurity: No Food Insecurity (08/19/2022)   Hunger Vital Sign    Worried About Running Out of Food in the Last Year: Never true    Ran Out of Food in the Last Year: Never true  Transportation Needs: No Transportation Needs (08/19/2022)   PRAPARE - Administrator, Civil Service (Medical): No    Lack of Transportation (Non-Medical): No  Physical Activity: Sufficiently Active (07/26/2022)   Exercise Vital Sign    Days of Exercise per Week: 5 days    Minutes of Exercise per Session: 30 min  Stress: No Stress Concern Present (07/26/2022)   [employer] of Occupational Health - Occupational Stress Questionnaire    Feeling of Stress : Not at all  Social Connections: Socially Integrated (07/26/2022)   Social Connection and Isolation Panel [NHANES]    Frequency of Communication with Friends and Family: More than three times a week    Frequency of Social Gatherings with  Friends and Family: More than three times a week    Attends Religious Services: More than 4 times per year    Active Member of [employer] or Organizations: Yes    Attends Engineer, structural: More than 4 times per year    Marital Status: Married  Catering manager Violence: Not At Risk (07/26/2022)   Humiliation, Afraid, Rape, and Kick questionnaire    Fear of Current or Ex-Partner: No    Emotionally Abused: No    Physically Abused: No    Sexually Abused: No    Family History  Problem Relation Age of Onset   Lung cancer Mother    Hypertension Mother    Stroke Mother    Diabetes Mother    Diabetes Father    Hypertension Father    Heart attack Father    Asthma Father    COPD Father    Alcohol abuse Father    Diabetes Sister    Hypertension Sister    Breast cancer Neg Hx     ROS: Back pain but no fevers or chills, productive cough, hemoptysis, dysphasia, odynophagia, melena, hematochezia, dysuria, hematuria, rash, seizure activity, orthopnea, PND, pedal edema, claudication. Remaining systems are negative.  Physical Exam: Well-developed well-nourished in no acute distress.  Skin is warm and dry.  HEENT is normal.  Neck is supple.  Chest is clear to auscultation with normal expansion.  Cardiovascular exam is regular rate and rhythm.  Abdominal exam nontender or distended. No masses palpated. Extremities show no edema. neuro grossly intact  EKG Interpretation Date/Time:  Thursday November 17 2022 08:06:27 EDT Ventricular Rate:  79 PR Interval:  164 QRS Duration:  92 QT Interval:  386 QTC Calculation: 442 R Axis:   42  Text Interpretation: Normal sinus rhythm Normal ECG When compared with ECG of 23-Aug-2019 14:43, PREVIOUS ECG IS PRESENT Confirmed by Olga Millers ([id number]) on 11/17/2022 8:19:06 AM    A/P  1 chest pain-patient has had difficulties with chronic chest pain.  Previous PET scan  showed ischemia in the distribution of her occluded PDA which was known.   Electrocardiogram does not show significant ST changes.  Previously felt not to be a good candidate for CTO PCI by Dr. Swaziland.  Plan to continue medical therapy.  2 coronary artery disease-continue aspirin.  Patient is intolerant to statins.  3 dilated ascending aorta-4 cm on recent CTA.  Will repeat study August 2025.  4 hyperlipidemia-patient did not tolerate statins or Zetia.  She also felt that Repatha was contributing to her cough.  She is not interested in pursuing other lipid-lowering medications.  5 hypertension-blood pressure has been running low at home by her report.  This is likely secondary to weight loss.  We will discontinue amlodipine as this is also contributing to pedal edema by her report.  Follow blood pressure and adjust medications as needed.  Olga Millers, MD

## 2022-11-11 ENCOUNTER — Other Ambulatory Visit (HOSPITAL_COMMUNITY): Payer: Self-pay

## 2022-11-17 ENCOUNTER — Encounter: Payer: Self-pay | Admitting: Cardiology

## 2022-11-17 ENCOUNTER — Ambulatory Visit: Payer: Medicare HMO | Attending: Cardiology | Admitting: Cardiology

## 2022-11-17 VITALS — BP 102/70 | HR 79 | Ht 66.5 in | Wt 182.8 lb

## 2022-11-17 DIAGNOSIS — R072 Precordial pain: Secondary | ICD-10-CM

## 2022-11-17 DIAGNOSIS — I712 Thoracic aortic aneurysm, without rupture, unspecified: Secondary | ICD-10-CM | POA: Diagnosis not present

## 2022-11-17 DIAGNOSIS — I2510 Atherosclerotic heart disease of native coronary artery without angina pectoris: Secondary | ICD-10-CM | POA: Diagnosis not present

## 2022-11-17 DIAGNOSIS — I10 Essential (primary) hypertension: Secondary | ICD-10-CM | POA: Diagnosis not present

## 2022-11-17 DIAGNOSIS — E7800 Pure hypercholesterolemia, unspecified: Secondary | ICD-10-CM | POA: Diagnosis not present

## 2022-11-17 NOTE — Patient Instructions (Signed)
Medication Instructions:   STOP AMLODIPINE  *If you need a refill on your cardiac medications before your next appointment, please call your pharmacy*   Follow-Up: At [facility], you and your health needs are our priority.  As part of our continuing mission to provide you with exceptional heart care, we have created designated Provider Care Teams.  These Care Teams include your primary Cardiologist (physician) and Advanced Practice Providers (APPs -  Physician Assistants and Nurse Practitioners) who all work together to provide you with the care you need, when you need it.  We recommend signing up for the patient portal called "MyChart".  Sign up information is provided on this After Visit Summary.  MyChart is used to connect with patients for Virtual Visits (Telemedicine).  Patients are able to view lab/test results, encounter notes, upcoming appointments, etc.  Non-urgent messages can be sent to your provider as well.   To learn more about what you can do with MyChart, go to [email or web address].    Your next appointment:   12 month(s)  Provider:   Olga Millers, MD

## 2022-11-22 DIAGNOSIS — Z09 Encounter for follow-up examination after completed treatment for conditions other than malignant neoplasm: Secondary | ICD-10-CM | POA: Diagnosis not present

## 2022-11-22 DIAGNOSIS — Z86010 Personal history of colonic polyps: Secondary | ICD-10-CM | POA: Diagnosis not present

## 2022-11-22 DIAGNOSIS — D124 Benign neoplasm of descending colon: Secondary | ICD-10-CM | POA: Diagnosis not present

## 2022-11-22 LAB — HM COLONOSCOPY

## 2022-11-24 DIAGNOSIS — D124 Benign neoplasm of descending colon: Secondary | ICD-10-CM | POA: Diagnosis not present

## 2022-11-28 ENCOUNTER — Encounter: Payer: Self-pay | Admitting: Family Medicine

## 2022-11-30 DIAGNOSIS — Z4789 Encounter for other orthopedic aftercare: Secondary | ICD-10-CM | POA: Diagnosis not present

## 2022-12-09 ENCOUNTER — Other Ambulatory Visit (HOSPITAL_COMMUNITY): Payer: Self-pay

## 2022-12-13 ENCOUNTER — Other Ambulatory Visit: Payer: Self-pay | Admitting: Family Medicine

## 2022-12-13 DIAGNOSIS — F4323 Adjustment disorder with mixed anxiety and depressed mood: Secondary | ICD-10-CM

## 2022-12-14 ENCOUNTER — Other Ambulatory Visit: Payer: Self-pay

## 2022-12-14 DIAGNOSIS — E7800 Pure hypercholesterolemia, unspecified: Secondary | ICD-10-CM

## 2022-12-14 DIAGNOSIS — R5382 Chronic fatigue, unspecified: Secondary | ICD-10-CM

## 2022-12-14 DIAGNOSIS — E66811 Obesity, class 1: Secondary | ICD-10-CM

## 2022-12-15 ENCOUNTER — Other Ambulatory Visit: Payer: Medicare HMO

## 2022-12-15 DIAGNOSIS — R5382 Chronic fatigue, unspecified: Secondary | ICD-10-CM | POA: Diagnosis not present

## 2022-12-15 DIAGNOSIS — E7800 Pure hypercholesterolemia, unspecified: Secondary | ICD-10-CM | POA: Diagnosis not present

## 2022-12-15 DIAGNOSIS — E66811 Obesity, class 1: Secondary | ICD-10-CM

## 2022-12-15 DIAGNOSIS — R7301 Impaired fasting glucose: Secondary | ICD-10-CM | POA: Diagnosis not present

## 2022-12-16 LAB — LIPID PANEL
Chol/HDL Ratio: 4.3 {ratio} (ref 0.0–4.4)
Cholesterol, Total: 192 mg/dL (ref 100–199)
HDL: 45 mg/dL (ref >39)
LDL Chol Calc (NIH): 112 mg/dL — ABNORMAL HIGH (ref 0–99)
Triglycerides: 203 mg/dL — ABNORMAL HIGH (ref 0–149)
VLDL Cholesterol Cal: 35 mg/dL (ref 5–40)

## 2022-12-16 LAB — HEMOGLOBIN A1C
Est. average glucose Bld gHb Est-mCnc: 111 mg/dL
Hgb A1c MFr Bld: 5.5 % (ref 4.8–5.6)

## 2022-12-16 LAB — COMPREHENSIVE METABOLIC PANEL
ALT: 9 [IU]/L (ref 0–32)
AST: 16 [IU]/L (ref 0–40)
Albumin: 4.4 g/dL (ref 3.9–4.9)
Alkaline Phosphatase: 99 [IU]/L (ref 44–121)
BUN/Creatinine Ratio: 18 (ref 12–28)
BUN: 13 mg/dL (ref 8–27)
Bilirubin Total: 0.3 mg/dL (ref 0.0–1.2)
CO2: 21 mmol/L (ref 20–29)
Calcium: 9.4 mg/dL (ref 8.7–10.3)
Chloride: 103 mmol/L (ref 96–106)
Creatinine, Ser: 0.72 mg/dL (ref 0.57–1.00)
Globulin, Total: 2.3 g/dL (ref 1.5–4.5)
Glucose: 75 mg/dL (ref 70–99)
Potassium: 4.5 mmol/L (ref 3.5–5.2)
Sodium: 139 mmol/L (ref 134–144)
Total Protein: 6.7 g/dL (ref 6.0–8.5)
eGFR: 93 mL/min/{1.73_m2} (ref >59)

## 2022-12-16 LAB — TSH: TSH: 1.7 u[IU]/mL (ref 0.450–4.500)

## 2022-12-20 DIAGNOSIS — Z0100 Encounter for examination of eyes and vision without abnormal findings: Secondary | ICD-10-CM | POA: Diagnosis not present

## 2022-12-22 ENCOUNTER — Ambulatory Visit: Payer: Medicare HMO | Admitting: Family Medicine

## 2022-12-22 ENCOUNTER — Encounter: Payer: Self-pay | Admitting: Family Medicine

## 2022-12-22 VITALS — BP 112/73 | HR 70 | Resp 18 | Ht 66.5 in | Wt 186.0 lb

## 2022-12-22 DIAGNOSIS — Z23 Encounter for immunization: Secondary | ICD-10-CM

## 2022-12-22 DIAGNOSIS — F320 Major depressive disorder, single episode, mild: Secondary | ICD-10-CM | POA: Diagnosis not present

## 2022-12-22 DIAGNOSIS — R300 Dysuria: Secondary | ICD-10-CM | POA: Diagnosis not present

## 2022-12-22 DIAGNOSIS — E890 Postprocedural hypothyroidism: Secondary | ICD-10-CM

## 2022-12-22 DIAGNOSIS — E7800 Pure hypercholesterolemia, unspecified: Secondary | ICD-10-CM | POA: Diagnosis not present

## 2022-12-22 DIAGNOSIS — E66811 Obesity, class 1: Secondary | ICD-10-CM | POA: Diagnosis not present

## 2022-12-22 DIAGNOSIS — F411 Generalized anxiety disorder: Secondary | ICD-10-CM | POA: Diagnosis not present

## 2022-12-22 DIAGNOSIS — I10 Essential (primary) hypertension: Secondary | ICD-10-CM | POA: Diagnosis not present

## 2022-12-22 LAB — POCT URINALYSIS DIP (CLINITEK)
Bilirubin, UA: NEGATIVE
Blood, UA: NEGATIVE
Glucose, UA: NEGATIVE mg/dL
Ketones, POC UA: NEGATIVE mg/dL
Leukocytes, UA: NEGATIVE
Nitrite, UA: NEGATIVE
POC PROTEIN,UA: NEGATIVE
Spec Grav, UA: 1.01 (ref 1.010–1.025)
Urobilinogen, UA: 0.2 U/dL
pH, UA: 6 (ref 5.0–8.0)

## 2022-12-22 MED ORDER — WEGOVY 0.5 MG/0.5ML ~~LOC~~ SOAJ
0.5000 mg | SUBCUTANEOUS | 1 refills | Status: DC
Start: 2022-12-22 — End: 2022-12-26

## 2022-12-22 MED ORDER — HYDROXYZINE HCL 25 MG PO TABS
ORAL_TABLET | ORAL | 2 refills | Status: DC
Start: 2022-12-22 — End: 2023-09-26

## 2022-12-22 NOTE — Assessment & Plan Note (Signed)
TSH within normal limits. Continue levothyroxine 175 mcg daily.  Will continue to monitor.

## 2022-12-22 NOTE — Assessment & Plan Note (Signed)
Stable.  Continue amitriptyline 50 mg daily, hydroxyzine 25 mg as rescue medication.  Will continue to monitor.

## 2022-12-22 NOTE — Assessment & Plan Note (Signed)
Last lipid panel: LDL 112, HDL 45, triglycerides 203.  LDL decreased while triglycerides increased.  Previously failed 3 statins and Zetia.  Continue heart healthy diet low in fats.  In the future may try bile acid sequestrant or refer to lipid clinic as bile acids may affect thyroid levels. Will continue to monitor, especially as weight management efforts progress.

## 2022-12-22 NOTE — Assessment & Plan Note (Signed)
BP goal <130/80. Stable, at goal. Continue losartan 50 mg daily, amlodipine 5 mg daily, spironolactone 25 mg daily.  Last CMP within normal limits.

## 2022-12-22 NOTE — Assessment & Plan Note (Signed)
Stable.  Continue amitriptyline 50 mg daily, Wellbutrin 150 mg daily.  Will continue to monitor.

## 2022-12-22 NOTE — Assessment & Plan Note (Signed)
Patient happy with current routine.  Continue Wegovy 0.5 mg weekly, refill sent today.  Continue nutritional and lifestyle interventions to support weight loss as well.  Follow-up in 3 months for weight management and hyperlipidemia with fasting labs 1 week before.

## 2022-12-22 NOTE — Progress Notes (Signed)
Established Patient Office Visit  Subjective   Patient ID: Amy Hodge, female    DOB: 1955/06/22  Age: 67 y.o. MRN: [id number]  Chief Complaint  Patient presents with   Diabetes   Hyperlipidemia   Hypertension    HPI Amy Hodge is a 67 y.o. female presenting today for follow up of hypertension, hyperlipidemia, hypothyroidism, weight management.  She also complains of 1 week of pelvic pressure, dysuria, and increased urinary frequency.  Denies changes in urine color, the odor has become stronger.  Denies flank pain, fever, chills, nausea/vomiting. Hypertension: Patient here for follow-up of elevated blood pressure. She is exercising and is adherent to low salt diet.   Pt denies chest pain, SOB, dizziness, edema, syncope, fatigue or heart palpitations. Taking losartan, amlodipine, and spironolactone, reports excellent compliance with treatment. Denies side effects. Hyperlipidemia: Currently consuming a low fat and low carb  diet.  The 10-year ASCVD risk score (Arnett DK, et al., 2019) is: 6.1% Hypothyroidism: Taking levothyroxine 175 mcg regularly in the AM away from food and vitamins. Denies fatigue, weight changes, heat/cold intolerance, skin/hair changes, bowel changes, CVS symptoms. Weight management: Weight has increased 3 lbs since last visit.  She admits that now that the weather is cooler she has been having more cornbread and soup which has stalled her progress and increased triglyceride levels.  They have been following a low-carb diet prioritizing protein.  She is currently taking Wegovy 0.5 mg weekly and would like to stay at this dose.    Outpatient Medications Prior to Visit  Medication Sig   acetaminophen (TYLENOL) 500 MG tablet Take 1,000 mg by mouth every 8 (eight) hours as needed (for pain or headaches).   albuterol (VENTOLIN HFA) 108 (90 Base) MCG/ACT inhaler INHALE 1 TO 2 PUFFS INTO THE LUNGS EVERY 6 HOURS AS NEEDED FOR WHEEZING OR SHORTNESS OF BREATH.    amitriptyline (ELAVIL) 50 MG tablet TAKE 1 TABLET AT BEDTIME   aspirin EC 81 MG tablet Take 81 mg by mouth daily at 6 PM.    B Complex Vitamins (VITAMIN B COMPLEX PO) Take 1 tablet by mouth daily.   buPROPion (WELLBUTRIN XL) 150 MG 24 hr tablet TAKE 1 TABLET EVERY DAY   chlorpheniramine-HYDROcodone (TUSSIONEX PENNKINETIC ER) 10-8 MG/5ML SUER Take 5 mLs by mouth every 12 (twelve) hours as needed for cough.   chlorpheniramine-HYDROcodone (TUSSIONEX) 10-8 MG/5ML 5 ml every 12 hours if needed for cough   Cholecalciferol (VITAMIN D3) 5000 units CAPS Take 5,000 Units by mouth daily.    cyclobenzaprine (FLEXERIL) 10 MG tablet TAKE 1 TABLET THREE TIMES DAILY AS NEEDED FOR MUSCLE SPASM(S)   DULoxetine (CYMBALTA) 20 MG capsule TAKE 2 CAPSULES EVERY DAY   fexofenadine (ALLEGRA) 180 MG tablet Take 1 tablet (180 mg total) by mouth daily.   levothyroxine (SYNTHROID) 175 MCG tablet Take 1 tablet (175 mcg total) by mouth daily.   losartan (COZAAR) 50 MG tablet TAKE 1 TABLET EVERY DAY   LYSINE PO Take 2,000 mg by mouth daily.   Multiple Vitamin (MULTIVITAMIN) tablet Take 1 tablet by mouth daily.   nitroGLYCERIN (NITROSTAT) 0.4 MG SL tablet Place 0.4 mg under the tongue every 5 (five) minutes as needed for chest pain.   omeprazole (PRILOSEC) 20 MG capsule TAKE 1 CAPSULE IN THE MORNING AND AT BEDTIME.   spironolactone (ALDACTONE) 25 MG tablet TAKE 1 TABLET EVERY DAY   [DISCONTINUED] hydrOXYzine (ATARAX) 25 MG tablet TAKE 1 TO 2 TABLETS BY MOUTH EVERY 6 TO 8 HOURS AS  NEEDED FOR ANXIETY   [DISCONTINUED] Semaglutide-Weight Management (WEGOVY) 0.5 MG/0.5ML SOAJ Inject 0.5 mg into the skin once a week.   No facility-administered medications prior to visit.    ROS Negative unless otherwise noted in HPI   Objective:     BP 112/73 (BP Location: Left Arm, Patient Position: Sitting, Cuff Size: Normal)   Pulse 70   Resp 18   Ht 5' 6.5" (1.689 m)   Wt 186 lb (84.4 kg)   SpO2 100%   BMI 29.57 kg/m   Physical  Exam Constitutional:      General: She is not in acute distress.    Appearance: Normal appearance.  HENT:     Head: Normocephalic and atraumatic.  Cardiovascular:     Rate and Rhythm: Normal rate and regular rhythm.     Heart sounds: No murmur heard.    No friction rub. No gallop.  Pulmonary:     Effort: Pulmonary effort is normal. No respiratory distress.     Breath sounds: No wheezing, rhonchi or rales.  Skin:    General: Skin is warm and dry.  Neurological:     Mental Status: She is alert and oriented to person, place, and time.    Results for orders placed or performed in visit on 12/22/22  POCT URINALYSIS DIP (CLINITEK)  Result Value Ref Range   Color, UA yellow yellow   Clarity, UA clear clear   Glucose, UA negative negative mg/dL   Bilirubin, UA negative negative   Ketones, POC UA negative negative mg/dL   Spec Grav, UA 4.098 1.191 - 1.025   Blood, UA negative negative   pH, UA 6.0 5.0 - 8.0   POC PROTEIN,UA negative negative, trace   Urobilinogen, UA 0.2 0.2 or 1.0 E.U./dL   Nitrite, UA Negative Negative   Leukocytes, UA Negative Negative     Assessment & Plan:  HTN, goal below 130/80 Assessment & Plan: BP goal <130/80. Stable, at goal. Continue losartan 50 mg daily, amlodipine 5 mg daily, spironolactone 25 mg daily.  Last CMP within normal limits.     Pure hypercholesterolemia Assessment & Plan: Last lipid panel: LDL 112, HDL 45, triglycerides 203.  LDL decreased while triglycerides increased.  Previously failed 3 statins and Zetia.  Continue heart healthy diet low in fats.  In the future may try bile acid sequestrant or refer to lipid clinic as bile acids may affect thyroid levels. Will continue to monitor, especially as weight management efforts progress.   GAD (generalized anxiety disorder) Assessment & Plan: Stable.  Continue amitriptyline 50 mg daily, hydroxyzine 25 mg as rescue medication.  Will continue to monitor.  Orders: -     hydrOXYzine HCl;  TAKE 1 TO 2 TABLETS BY MOUTH EVERY 6 TO 8 HOURS AS NEEDED FOR ANXIETY  Dispense: 60 tablet; Refill: 2  Current mild episode of major depressive disorder, unspecified whether recurrent (HCC) Assessment & Plan: Stable.  Continue amitriptyline 50 mg daily, Wellbutrin 150 mg daily.  Will continue to monitor.   Postoperative hypothyroidism Assessment & Plan: TSH within normal limits. Continue levothyroxine 175 mcg daily.  Will continue to monitor.   Need for influenza vaccination -     Flu Vaccine Trivalent High Dose (Fluad)  Obesity, Class I, BMI 30-34.9 Assessment & Plan: Patient happy with current routine.  Continue Wegovy 0.5 mg weekly, refill sent today.  Continue nutritional and lifestyle interventions to support weight loss as well.  Follow-up in 3 months for weight management and hyperlipidemia with fasting  labs 1 week before.  Orders: -     Wegovy; Inject 0.5 mg into the skin once a week.  Dispense: 4 mL; Refill: 1  Dysuria -     POCT URINALYSIS DIP (CLINITEK)  Urinalysis negative for UTI.  Continue hydration and use Azo for symptomatic relief.  If there is no improvement after week, let me know.  Go to the emergency room right away if you develop flank pain, fever, chills.   CMP, CBC within normal limits.  Return in about 3 months (around 03/24/2023) for follow-up for HTN, HLD, weight management, fasting blood work 1 week before.    Melida Quitter, PA

## 2022-12-26 ENCOUNTER — Other Ambulatory Visit (HOSPITAL_COMMUNITY): Payer: Self-pay

## 2022-12-26 ENCOUNTER — Other Ambulatory Visit: Payer: Self-pay

## 2022-12-26 ENCOUNTER — Telehealth: Payer: Self-pay | Admitting: *Deleted

## 2022-12-26 DIAGNOSIS — E66811 Obesity, class 1: Secondary | ICD-10-CM

## 2022-12-26 MED ORDER — WEGOVY 0.5 MG/0.5ML ~~LOC~~ SOAJ
0.5000 mg | SUBCUTANEOUS | 1 refills | Status: DC
Start: 2022-12-26 — End: 2023-02-06
  Filled 2022-12-26 (×2): qty 4, 56d supply, fill #0
  Filled 2022-12-29 (×2): qty 2, 28d supply, fill #0
  Filled 2022-12-29: qty 4, 56d supply, fill #0
  Filled 2023-01-02: qty 2, 28d supply, fill #0

## 2022-12-26 NOTE — Telephone Encounter (Signed)
Pt calling to say that her Reg[facility] Lutes Rx was supposed to go to [facility], please resend and if there is any problems please let pt know.

## 2022-12-26 NOTE — Telephone Encounter (Signed)
Prescription sent to [facility].

## 2022-12-29 ENCOUNTER — Other Ambulatory Visit (HOSPITAL_COMMUNITY): Payer: Self-pay

## 2023-01-02 ENCOUNTER — Other Ambulatory Visit (HOSPITAL_COMMUNITY): Payer: Self-pay

## 2023-01-06 ENCOUNTER — Other Ambulatory Visit (HOSPITAL_COMMUNITY): Payer: Self-pay

## 2023-01-09 ENCOUNTER — Other Ambulatory Visit: Payer: Self-pay

## 2023-01-09 ENCOUNTER — Other Ambulatory Visit (HOSPITAL_COMMUNITY): Payer: Self-pay

## 2023-01-10 DIAGNOSIS — Z08 Encounter for follow-up examination after completed treatment for malignant neoplasm: Secondary | ICD-10-CM | POA: Diagnosis not present

## 2023-01-10 DIAGNOSIS — L821 Other seborrheic keratosis: Secondary | ICD-10-CM | POA: Diagnosis not present

## 2023-01-10 DIAGNOSIS — D225 Melanocytic nevi of trunk: Secondary | ICD-10-CM | POA: Diagnosis not present

## 2023-01-10 DIAGNOSIS — Z85828 Personal history of other malignant neoplasm of skin: Secondary | ICD-10-CM | POA: Diagnosis not present

## 2023-01-10 DIAGNOSIS — L814 Other melanin hyperpigmentation: Secondary | ICD-10-CM | POA: Diagnosis not present

## 2023-01-20 ENCOUNTER — Telehealth: Payer: Self-pay | Admitting: Internal Medicine

## 2023-01-20 NOTE — Telephone Encounter (Signed)
  need a refill on the cough syrup hydrocortisone cough syrup  [facility] - [place], [place] - [address]

## 2023-02-06 ENCOUNTER — Encounter: Payer: Self-pay | Admitting: Family Medicine

## 2023-02-06 ENCOUNTER — Other Ambulatory Visit (HOSPITAL_COMMUNITY): Payer: Self-pay

## 2023-02-06 DIAGNOSIS — E66811 Obesity, class 1: Secondary | ICD-10-CM

## 2023-02-06 MED ORDER — WEGOVY 1 MG/0.5ML ~~LOC~~ SOAJ
1.0000 mg | SUBCUTANEOUS | 0 refills | Status: DC
  Filled 2023-02-06: qty 2, 28d supply, fill #0

## 2023-02-08 ENCOUNTER — Other Ambulatory Visit: Payer: Self-pay | Admitting: Cardiology

## 2023-02-08 DIAGNOSIS — I10 Essential (primary) hypertension: Secondary | ICD-10-CM

## 2023-02-09 ENCOUNTER — Other Ambulatory Visit (HOSPITAL_COMMUNITY): Payer: Self-pay

## 2023-03-05 ENCOUNTER — Other Ambulatory Visit: Payer: Self-pay | Admitting: Family Medicine

## 2023-03-05 DIAGNOSIS — E66811 Obesity, class 1: Secondary | ICD-10-CM

## 2023-03-06 ENCOUNTER — Other Ambulatory Visit (HOSPITAL_COMMUNITY): Payer: Self-pay

## 2023-03-06 MED ORDER — WEGOVY 1 MG/0.5ML ~~LOC~~ SOAJ
1.0000 mg | SUBCUTANEOUS | 0 refills | Status: DC
Start: 2023-03-06 — End: 2023-03-24
  Filled 2023-03-06: qty 2, 28d supply, fill #0

## 2023-03-09 ENCOUNTER — Other Ambulatory Visit: Payer: Self-pay

## 2023-03-09 DIAGNOSIS — E66811 Obesity, class 1: Secondary | ICD-10-CM

## 2023-03-09 DIAGNOSIS — R5382 Chronic fatigue, unspecified: Secondary | ICD-10-CM

## 2023-03-09 DIAGNOSIS — E7800 Pure hypercholesterolemia, unspecified: Secondary | ICD-10-CM

## 2023-03-10 ENCOUNTER — Other Ambulatory Visit (HOSPITAL_COMMUNITY): Payer: Self-pay

## 2023-03-17 ENCOUNTER — Other Ambulatory Visit: Payer: Medicare HMO

## 2023-03-17 DIAGNOSIS — R5382 Chronic fatigue, unspecified: Secondary | ICD-10-CM

## 2023-03-17 DIAGNOSIS — E66811 Obesity, class 1: Secondary | ICD-10-CM

## 2023-03-17 DIAGNOSIS — E7800 Pure hypercholesterolemia, unspecified: Secondary | ICD-10-CM | POA: Diagnosis not present

## 2023-03-17 DIAGNOSIS — R7301 Impaired fasting glucose: Secondary | ICD-10-CM | POA: Diagnosis not present

## 2023-03-18 LAB — COMPREHENSIVE METABOLIC PANEL
ALT: 10 [IU]/L (ref 0–32)
AST: 21 [IU]/L (ref 0–40)
Albumin: 4.6 g/dL (ref 3.9–4.9)
Alkaline Phosphatase: 90 [IU]/L (ref 44–121)
BUN/Creatinine Ratio: 12 (ref 12–28)
BUN: 10 mg/dL (ref 8–27)
Bilirubin Total: 0.3 mg/dL (ref 0.0–1.2)
CO2: 22 mmol/L (ref 20–29)
Calcium: 9.8 mg/dL (ref 8.7–10.3)
Chloride: 103 mmol/L (ref 96–106)
Creatinine, Ser: 0.82 mg/dL (ref 0.57–1.00)
Globulin, Total: 2.5 g/dL (ref 1.5–4.5)
Glucose: 84 mg/dL (ref 70–99)
Potassium: 4.7 mmol/L (ref 3.5–5.2)
Sodium: 139 mmol/L (ref 134–144)
Total Protein: 7.1 g/dL (ref 6.0–8.5)
eGFR: 79 mL/min/{1.73_m2} (ref >59)

## 2023-03-18 LAB — LIPID PANEL
Chol/HDL Ratio: 3.6 {ratio} (ref 0.0–4.4)
Cholesterol, Total: 198 mg/dL (ref 100–199)
HDL: 55 mg/dL (ref >39)
LDL Chol Calc (NIH): 115 mg/dL — ABNORMAL HIGH (ref 0–99)
Triglycerides: 159 mg/dL — ABNORMAL HIGH (ref 0–149)
VLDL Cholesterol Cal: 28 mg/dL (ref 5–40)

## 2023-03-18 LAB — HEMOGLOBIN A1C
Est. average glucose Bld gHb Est-mCnc: 105 mg/dL
Hgb A1c MFr Bld: 5.3 % (ref 4.8–5.6)

## 2023-03-18 LAB — TSH: TSH: 0.865 u[IU]/mL (ref 0.450–4.500)

## 2023-03-24 ENCOUNTER — Encounter: Payer: Self-pay | Admitting: Family Medicine

## 2023-03-24 ENCOUNTER — Other Ambulatory Visit (HOSPITAL_COMMUNITY): Payer: Self-pay

## 2023-03-24 ENCOUNTER — Ambulatory Visit (INDEPENDENT_AMBULATORY_CARE_PROVIDER_SITE_OTHER): Payer: Medicare HMO | Admitting: Family Medicine

## 2023-03-24 VITALS — BP 95/62 | HR 77 | Ht 66.5 in | Wt 183.4 lb

## 2023-03-24 DIAGNOSIS — I10 Essential (primary) hypertension: Secondary | ICD-10-CM | POA: Diagnosis not present

## 2023-03-24 DIAGNOSIS — E7800 Pure hypercholesterolemia, unspecified: Secondary | ICD-10-CM

## 2023-03-24 DIAGNOSIS — N3941 Urge incontinence: Secondary | ICD-10-CM | POA: Insufficient documentation

## 2023-03-24 DIAGNOSIS — Z23 Encounter for immunization: Secondary | ICD-10-CM

## 2023-03-24 DIAGNOSIS — E66811 Obesity, class 1: Secondary | ICD-10-CM

## 2023-03-24 MED ORDER — OXYBUTYNIN CHLORIDE ER 5 MG PO TB24
5.0000 mg | ORAL_TABLET | Freq: Every day | ORAL | 1 refills | Status: DC
Start: 2023-03-24 — End: 2023-03-28

## 2023-03-24 MED ORDER — SPIRONOLACTONE 25 MG PO TABS: 12.5000 mg | ORAL_TABLET | Freq: Every day | ORAL | 1 refills | Status: DC

## 2023-03-24 MED ORDER — WEGOVY 1 MG/0.5ML ~~LOC~~ SOAJ
1.0000 mg | SUBCUTANEOUS | 0 refills | Status: DC
  Filled 2023-03-24: qty 6, 84d supply, fill #0
  Filled 2023-03-31 – 2023-04-02 (×2): qty 2, 28d supply, fill #0

## 2023-03-24 MED ORDER — WEGOVY 1 MG/0.5ML ~~LOC~~ SOAJ: 1.0000 mg | SUBCUTANEOUS | 0 refills | Status: DC

## 2023-03-24 NOTE — Assessment & Plan Note (Signed)
Last lipid panel: LDL 115, HDL 55, triglycerides 159.  Triglycerides improved significantly compared to 203 when last checked 3 months ago.  Previously failed 3 statins and Zetia.  Continue heart healthy diet low in fats.  In the future may try bile acid sequestrant or refer to lipid clinic as bile acids may affect thyroid levels. Will continue to monitor, especially as weight management efforts progress.

## 2023-03-24 NOTE — Assessment & Plan Note (Signed)
  Addendum 03/28/2023: Patient is already taking amitriptyline 50 mg daily at bedtime.  Per recommendations for patients 65 and older ([place]), do not recommend polypharmacy with amitriptyline and oxybutynin due to increased risk of side effects secondary to anticholinergic effects of both medications.  Instead, recommend beta 3 agonist vibegron 75 mg daily.  Hepatic function and renal function on most recent labs within normal limits, hypertension is well-controlled making this the best treatment option.  We will continue to monitor blood pressure closely given her history of hypertension.

## 2023-03-24 NOTE — Assessment & Plan Note (Addendum)
BP goal <130/80. Stable, at goal.  Blood pressures have actually been low even after discontinuing amlodipine.  Continue losartan 50 mg daily, decrease spironolactone dose to spironolactone 12.5 mg daily.  Last CMP within normal limits.

## 2023-03-24 NOTE — Patient Instructions (Signed)
BLOOD PRESSURE: -Continue your full dose of losartan 50 mg daily, decrease your dose of spironolactone to half tablet (12.5 mg) daily.  BLADDER: -Start taking oxybutynin. Start with 1 tablet (5 mg total) at bedtime. If ineffective after 2 weeks, may increase dose to 2 tablets (10 mg total) at bedtime.

## 2023-03-24 NOTE — Assessment & Plan Note (Signed)
Starting weight: 200 lbs, BMI 32.3 Current weight: Total weight change:   Patient happy with current routine.  Continue Wegovy 1 mg weekly, refill sent today.  Continue nutritional and lifestyle interventions to support weight loss as well.

## 2023-03-24 NOTE — Progress Notes (Signed)
Established Patient Office Visit  Subjective   Patient ID: Amy Hodge, female    DOB: 1955/12/03  Age: 68 y.o. MRN: [id number]  Chief Complaint  Patient presents with   Medical Management of Chronic Issues    HPI [name] is a 68 y.o. female presenting today for follow up of hypertension, hyperlipidemia, weight management.  She continues to experience urge incontinence and urinary frequency.  With the urge to urinate, there are times that she is able to use the bathroom but other times that nothing comes out. Hypertension: Patient here for follow-up of elevated blood pressure. She is exercising and is adherent to low salt diet.   Pt denies chest pain, SOB, dizziness, edema, syncope, fatigue or heart palpitations. Taking losartan, amlodipine, and spironolactone, reports excellent compliance with treatment. Denies side effects. Hyperlipidemia: Previously has tried 3 statin medications and Zetia, all caused myalgias.   The 10-year ASCVD risk score (Arnett DK, et al., 2019) is: 4.1% Weight management: Weight has decreased 3 lbs since last visit. They have been following a low-carb diet prioritizing protein.  She is currently taking Wegovy 1 mg weekly.   Outpatient Medications Prior to Visit  Medication Sig   acetaminophen (TYLENOL) 500 MG tablet Take 1,000 mg by mouth every 8 (eight) hours as needed (for pain or headaches).   albuterol (VENTOLIN HFA) 108 (90 Base) MCG/ACT inhaler INHALE 1 TO 2 PUFFS INTO THE LUNGS EVERY 6 HOURS AS NEEDED FOR WHEEZING OR SHORTNESS OF BREATH.   amitriptyline (ELAVIL) 50 MG tablet TAKE 1 TABLET AT BEDTIME   aspirin EC 81 MG tablet Take 81 mg by mouth daily at 6 PM.    B Complex Vitamins (VITAMIN B COMPLEX PO) Take 1 tablet by mouth daily.   buPROPion (WELLBUTRIN XL) 150 MG 24 hr tablet TAKE 1 TABLET EVERY DAY   chlorpheniramine-HYDROcodone (TUSSIONEX PENNKINETIC ER) 10-8 MG/5ML SUER Take 5 mLs by mouth every 12 (twelve) hours as needed for cough.    chlorpheniramine-HYDROcodone (TUSSIONEX) 10-8 MG/5ML 5 ml every 12 hours if needed for cough   Cholecalciferol (VITAMIN D3) 5000 units CAPS Take 5,000 Units by mouth daily.    cyclobenzaprine (FLEXERIL) 10 MG tablet TAKE 1 TABLET THREE TIMES DAILY AS NEEDED FOR MUSCLE SPASM(S)   DULoxetine (CYMBALTA) 20 MG capsule TAKE 2 CAPSULES EVERY DAY   fexofenadine (ALLEGRA) 180 MG tablet Take 1 tablet (180 mg total) by mouth daily.   hydrOXYzine (ATARAX) 25 MG tablet TAKE 1 TO 2 TABLETS BY MOUTH EVERY 6 TO 8 HOURS AS NEEDED FOR ANXIETY   levothyroxine (SYNTHROID) 175 MCG tablet Take 1 tablet (175 mcg total) by mouth daily.   losartan (COZAAR) 50 MG tablet TAKE 1 TABLET EVERY DAY   LYSINE PO Take 2,000 mg by mouth daily.   Multiple Vitamin (MULTIVITAMIN) tablet Take 1 tablet by mouth daily.   nitroGLYCERIN (NITROSTAT) 0.4 MG SL tablet Place 0.4 mg under the tongue every 5 (five) minutes as needed for chest pain.   omeprazole (PRILOSEC) 20 MG capsule TAKE 1 CAPSULE IN THE MORNING AND AT BEDTIME.   [DISCONTINUED] spironolactone (ALDACTONE) 25 MG tablet TAKE 1 TABLET EVERY DAY   [DISCONTINUED] WEGOVY 1 MG/0.5ML SOAJ Inject 1 mg into the skin once a week.   No facility-administered medications prior to visit.    ROS Negative unless otherwise noted in HPI   Objective:     BP 95/62   Pulse 77   Ht 5' 6.5" (1.689 m)   Wt 183 lb 6.4  oz (83.2 kg)   SpO2 94%   BMI 29.16 kg/m   Physical Exam Constitutional:      General: She is not in acute distress.    Appearance: Normal appearance.  HENT:     Head: Normocephalic and atraumatic.  Cardiovascular:     Rate and Rhythm: Normal rate and regular rhythm.     Heart sounds: No murmur heard.    No friction rub. No gallop.  Pulmonary:     Effort: Pulmonary effort is normal. No respiratory distress.     Breath sounds: No wheezing, rhonchi or rales.  Skin:    General: Skin is warm and dry.  Neurological:     Mental Status: She is alert and oriented  to person, place, and time.      Assessment & Plan:  HTN, goal below 130/80 Assessment & Plan: BP goal <130/80. Stable, at goal.  Blood pressures have actually been low even after discontinuing amlodipine.  Continue losartan 50 mg daily, decrease spironolactone dose to spironolactone 12.5 mg daily.  Last CMP within normal limits.    Orders: -     Spironolactone; Take 0.5 tablets (12.5 mg total) by mouth daily.  Dispense: 45 tablet; Refill: 1  Pure hypercholesterolemia Assessment & Plan: Last lipid panel: LDL 115, HDL 55, triglycerides 159.  Triglycerides improved significantly compared to 203 when last checked 3 months ago.  Previously failed 3 statins and Zetia.  Continue heart healthy diet low in fats.  In the future may try bile acid sequestrant or refer to lipid clinic as bile acids may affect thyroid levels. Will continue to monitor, especially as weight management efforts progress.   Obesity, Class I, BMI 30-34.9 Assessment & Plan: Starting weight: 200 lbs, BMI 32.3 Current weight: Total weight change:   Patient happy with current routine.  Continue Wegovy 1 mg weekly, refill sent today.  Continue nutritional and lifestyle interventions to support weight loss as well.   Orders: -     Wegovy; Inject 1 mg into the skin once a week.  Dispense: 6 mL; Refill: 0  Need for pneumococcal 20-valent conjugate vaccination -     Pneumococcal conjugate vaccine 20-valent  Urge incontinence Assessment & Plan:  Addendum 03/28/2023: Patient is already taking amitriptyline 50 mg daily at bedtime.  Per recommendations for patients 65 and older ([place]), do not recommend polypharmacy with amitriptyline and oxybutynin due to increased risk of side effects secondary to anticholinergic effects of both medications.  Instead, recommend beta 3 agonist vibegron 75 mg daily.  Hepatic function and renal function on most recent labs within normal limits, hypertension is well-controlled making this the best  treatment option.  We will continue to monitor blood pressure closely given her history of hypertension.  Orders: -     Ambulatory referral to Urogynecology -     Vibegron; Take 1 tablet (75 mg total) by mouth daily. (May take in the morning or at bedtime)  Dispense: 30 tablet; Refill: 2    Return in about 3 months (around 06/22/2023) for follow-up for weight management, urge incontinence .    Melida Quitter, PA

## 2023-03-28 ENCOUNTER — Encounter: Payer: Self-pay | Admitting: Family Medicine

## 2023-03-28 MED ORDER — VIBEGRON 75 MG PO TABS: 75.0000 mg | ORAL_TABLET | Freq: Every day | ORAL | 2 refills | Status: DC

## 2023-03-28 NOTE — Addendum Note (Signed)
Addended by: Saralyn Pilar on: 03/28/2023 01:49 PM   Modules accepted: Orders

## 2023-03-30 ENCOUNTER — Other Ambulatory Visit: Payer: Self-pay | Admitting: Family Medicine

## 2023-03-30 DIAGNOSIS — E890 Postprocedural hypothyroidism: Secondary | ICD-10-CM

## 2023-03-31 ENCOUNTER — Other Ambulatory Visit (HOSPITAL_COMMUNITY): Payer: Self-pay

## 2023-04-02 ENCOUNTER — Other Ambulatory Visit (HOSPITAL_COMMUNITY): Payer: Self-pay

## 2023-04-03 ENCOUNTER — Other Ambulatory Visit (HOSPITAL_COMMUNITY): Payer: Self-pay

## 2023-04-10 ENCOUNTER — Other Ambulatory Visit (HOSPITAL_COMMUNITY): Payer: Self-pay

## 2023-04-10 ENCOUNTER — Telehealth: Payer: Self-pay | Admitting: *Deleted

## 2023-04-10 NOTE — Telephone Encounter (Signed)
Contacted pt to see if she was able to get her last refill and she said yes.  Informed her that when she goes to refill again it should prompt [facility] that she needs a PA and then we will process it.

## 2023-04-10 NOTE — Telephone Encounter (Signed)
Copied from CRM [id number]. Topic: Clinical - Prescription Issue >> Apr 10, 2023 11:33 AM Antony Haste wrote: Reason for CRM: Patient received a letter from her insurance for [facility] 1 MG/0.5ML SOAJ, her last prior authorization expired 02/2023, she is needed a new one completed for 2025.

## 2023-04-13 ENCOUNTER — Encounter: Payer: Self-pay | Admitting: Family Medicine

## 2023-04-17 ENCOUNTER — Telehealth: Payer: Self-pay

## 2023-04-17 ENCOUNTER — Other Ambulatory Visit (HOSPITAL_COMMUNITY): Payer: Self-pay

## 2023-04-17 DIAGNOSIS — E66811 Obesity, class 1: Secondary | ICD-10-CM

## 2023-04-17 MED ORDER — WEGOVY 1 MG/0.5ML ~~LOC~~ SOAJ
1.0000 mg | SUBCUTANEOUS | 2 refills | Status: DC
  Filled 2023-04-17 – 2023-05-08 (×3): qty 2, 28d supply, fill #0
  Filled 2023-05-29: qty 2, 28d supply, fill #1
  Filled 2023-07-03: qty 2, 28d supply, fill #2

## 2023-04-17 NOTE — Telephone Encounter (Signed)
 Copied from CRM [id number]. Topic: Clinical - Medication Question >> Apr 17, 2023  8:42 AM Lennie Ra H wrote: Reason for CRM: patient is going to run out of wegovy  before appointment with dri olson and wants to know will he be able to fill it in the meantime or will she need an appointment

## 2023-04-17 NOTE — Addendum Note (Signed)
 Addended by: Maryclare Smoke on: 04/17/2023 09:37 AM   Modules accepted: Orders

## 2023-04-17 NOTE — Telephone Encounter (Signed)
 Wegovy  refill sent to [facility] for the end of February

## 2023-04-19 NOTE — Progress Notes (Unsigned)
HPI female former smoker followed for COPD mixed type, hemoptysis, complicated by CAD/ MI a1AT WNL MM 138 6MWT-06/23/2014-94%, 93%, 99%, 452 m on room air. No oxygen limitation. PFT-06/23/14- moderate obstructive airways disease with insignificant response to bronchodilator, normal lung capacity, diffusion mildly reduced. FVC 2.64/70%, FEV1 1.92/65%, FEV1/FVC 0.72 ONOX-04/30/14- 314 minutes with oxygen saturation less than or equal to 88% on room air, qualifying for home O2 during sleep ECHO 05/16/13 EF 60-65%, GR1 diast dysfunction HST- 07/16/15- AHI 6/ hr, desaturation to 82%, body weight 186 lbs ---------------------------------------------------------------------------------------------------  04/13/21- 68 year old female former smoker(50 pkyrs) followed for COPD mixed type/ Cough, hemoptysis, complicated by CAD/ MI, RUL nodule, PAH.Covid infection Sept 2021, GERD, Aortic Aneurysm, -Neb pulmicort, albuterol hfa, tussionex, (Prone to thrush w steroid inhalers,  She has tried and failed Spiriva, Anoro, Symbicort and Breztri for COPD in the past d/t thrush and palpitation symptoms) Covid vax-2 Phizer Flu vax-had -----Patient states that she still has a cough that is sometimes dry and sometimes productive with white/yellow sputum. Feels like breathing is the same since last visit. PFT reviewed with her. Mild intermittent cough.  Uses Robitussin and small amounts of Tussionex.  Tessalon was no help.  Usually nonproductive.  She uses nebulized Pulmicort and albuterol rescue inhalers occasionally as needed, averaging once or twice a week and says these help.  Some persistent hoarseness she thinks may reflect postnasal drip. May need ENT if persistent. PFT 02/08/21- mild obstruction, no resp to BD, Nl DLCO CXR 12/07/20- IMPRESSION: 1. No radiographic evidence of acute cardiopulmonary disease. 2. Mild scarring in the left upper lobe is unchanged. 3. Aortic atherosclerosis.  04/21/23- 68 year old female  former smoker(50 pkyrs) followed for COPD mixed type/ Cough, hemoptysis, complicated by CAD/ MI, RUL nodule, PAH.Covid infection Sept 2021, GERD, Aortic Aneurysm, -Neb pulmicort, albuterol hfa, tussionex, (Prone to thrush w steroid inhalers,  She has tried and failed Spiriva, Anoro, Symbicort and Breztri for COPD in the past d/t thrush and palpitation symptoms) Doing well.  Need med refills Discussed the use of AI scribe software for clinical note transcription with the patient, who gave verbal consent to proceed.  History of Present Illness   The patient, with a history of emphysema and a small stable lung nodule, presents after a recent urgent care visit for sudden onset chest tightness and productive cough. She was treated with prednisone and an unspecified injection, which resolved her symptoms. She denies recent flu-like symptoms and has received her annual flu and pneumonia vaccines.  She reports occasional 'barking' cough, for which she uses Tessalon and Robitussin as needed. She also notes some shortness of breath, which she attributes to aging, but denies any sudden changes. She uses a nebulizer and albuterol rescue inhaler as needed, approximately three times per week.  Additionally, the patient had a fall in June resulting in a broken wrist requiring surgery. She had difficulty maintaining her oxygen levels in recovery  post-operatively, but has since recovered.     CT angio chest/aorta  10/10/22 IMPRESSION: 1. Stable 4.0 cm fusiform ectasia in the ascending aorta. Recommend annual imaging followup by CTA or MRA. This recommendation follows 2010 ACCF/AHA/AATS/ACR/ASA/ SCA/SCAI/SIR/STS/SVM Guidelines for the Diagnosis and Management of Patients with Thoracic Aortic Disease. Circulation. 2010; 121: W295-A213. Aortic aneurysm NOS (ICD10-I71.9) 2. Aortic and coronary artery atherosclerosis. 3. Emphysema. 4. Stable 5 mm right middle lobe nodule.   Emphysema (ICD10-J43.9).   ROS-see  HPI      + = positive Constitutional:   No-   weight loss, night sweats,  fevers, chills, fatigue, lassitude. HEENT:   + headaches, difficulty swallowing, tooth/dental problems, sore throat,       No-  sneezing, itching, ear ache, +nasal congestion, +post nasal drip,  CV:  +chest pain, orthopnea, PND, swelling in lower extremities, anasarca,          dizziness, +palpitations Resp: +shortness of breath with exertion or at rest.                productive cough,  + non-productive cough,   coughing up of blood.               change in color of mucus.  wheezing.   Skin: No-   rash or lesions. GI:  + heartburn, indigestion, abdominal pain, nausea, vomiting,  GU:  MS:  +  joint pain or swelling.   Neuro-     nothing unusual Psych:  No- change in mood or affect. No depression or anxiety.  No memory loss.  OBJ- Physical Exam General- Alert, Oriented, Affect-appropriate, Distress- none acute Skin- rash-none, lesions- none, excoriation- none Lymphadenopathy- none Head- atraumatic            Eyes- Gross vision intact, PERRLA, conjunctivae and secretions clear            Ears- Hearing, canals-normal            Nose- Clear, no-Septal dev, mucus, polyps, erosion, perforation             Throat- Mallampati II , mucosa clear , drainage- none, tonsils- atrophic,                    + dentures Neck- flexible , trachea midline, no stridor , thyroid nl, carotid no bruit Chest - symmetrical excursion , unlabored           Heart/CV- RRR , no murmur , no gallop  , no rub, nl s1 s2                           - JVD- none , edema- none, stasis changes- none, varices- none           Lung- clear to P&A, wheeze- none, cough+ raspy, dullness-none, rub- none           Chest wall-  Abd-  Br/ Gen/ Rectal- Not done, not indicated Extrem- cyanosis- none, clubbing, none, atrophy- none, strength- nl Neuro- grossly intact to observation   Assessment and Plan    Chronic Obstructive Pulmonary Disease (COPD) Stable with  occasional use of rescue inhaler (approximately three times per week). No significant changes in shortness of breath. Nebulizer used during recent illness. -Continue current management with rescue inhaler as needed. -Refill prescription for Tussionex.  Lung Nodule Stable small nodule in the right lung, unchanged on recent CT scan. -No further action required at this time.  General Health Maintenance Up to date on flu and pneumonia vaccinations. -Continue current vaccination schedule.

## 2023-04-21 ENCOUNTER — Encounter: Payer: Self-pay | Admitting: Internal Medicine

## 2023-04-21 ENCOUNTER — Ambulatory Visit: Payer: Medicare HMO | Admitting: Internal Medicine

## 2023-04-21 VITALS — BP 118/64 | HR 87 | Temp 98.2°F | Ht 66.5 in | Wt 184.4 lb

## 2023-04-21 DIAGNOSIS — J449 Chronic obstructive pulmonary disease, unspecified: Secondary | ICD-10-CM | POA: Diagnosis not present

## 2023-04-21 MED ORDER — HYDROCOD POLI-CHLORPHE POLI ER 10-8 MG/5ML PO SUER: ORAL | 0 refills | Status: DC

## 2023-04-21 NOTE — Patient Instructions (Signed)
Tussionex refilled  Please call if we can help

## 2023-04-23 ENCOUNTER — Other Ambulatory Visit: Payer: Self-pay | Admitting: Family Medicine

## 2023-04-23 DIAGNOSIS — G4733 Obstructive sleep apnea (adult) (pediatric): Secondary | ICD-10-CM

## 2023-04-25 ENCOUNTER — Other Ambulatory Visit (HOSPITAL_COMMUNITY): Payer: Self-pay

## 2023-04-28 ENCOUNTER — Other Ambulatory Visit (HOSPITAL_COMMUNITY): Payer: Self-pay

## 2023-04-28 ENCOUNTER — Other Ambulatory Visit: Payer: Self-pay | Admitting: Family Medicine

## 2023-04-28 DIAGNOSIS — E66811 Obesity, class 1: Secondary | ICD-10-CM

## 2023-04-28 NOTE — Telephone Encounter (Signed)
Copied from CRM [id number]. Topic: Clinical - Medication Refill >> Apr 28, 2023  8:52 AM Higinio Roger wrote: Most Recent Primary Care Visit:  Provider: Saralyn Pilar A  Department: [facility]  Visit Type: OFFICE VISIT 20  Date: 03/24/2023  Medication: WEGOVY 1 MG/0.5ML SOAJ (Starting on 05/04/2023)  Has the patient contacted their pharmacy? Yes (Agent: If no, request that the patient contact the pharmacy for the refill. If patient does not wish to contact the pharmacy document the reason why and proceed with request.) (Agent: If yes, when and what did the pharmacy advise?) Pharmacy is sending a request for refill for prior authorization  Is this the correct pharmacy for this prescription? Yes If no, delete pharmacy and type the correct one.  This is the patient's preferred pharmacy:   [facility] [address] [place] [address] Phone: [phone] Fax: [phone]   Has the prescription been filled recently? No  Is the patient out of the medication? Yes  Has the patient been seen for an appointment in the last year OR does the patient have an upcoming appointment? Yes  Can we respond through MyChart? Yes  Agent: Please be advised that Rx refills may take up to 3 business days. We ask that you follow-up with your pharmacy.

## 2023-05-03 ENCOUNTER — Other Ambulatory Visit: Payer: Self-pay | Admitting: Family Medicine

## 2023-05-03 DIAGNOSIS — F4323 Adjustment disorder with mixed anxiety and depressed mood: Secondary | ICD-10-CM

## 2023-05-03 DIAGNOSIS — K219 Gastro-esophageal reflux disease without esophagitis: Secondary | ICD-10-CM

## 2023-05-03 DIAGNOSIS — G4700 Insomnia, unspecified: Secondary | ICD-10-CM

## 2023-05-08 ENCOUNTER — Other Ambulatory Visit (HOSPITAL_COMMUNITY): Payer: Self-pay

## 2023-05-18 ENCOUNTER — Other Ambulatory Visit: Payer: Self-pay | Admitting: Family Medicine

## 2023-05-18 DIAGNOSIS — Z1231 Encounter for screening mammogram for malignant neoplasm of breast: Secondary | ICD-10-CM

## 2023-05-19 ENCOUNTER — Ambulatory Visit
Admission: RE | Admit: 2023-05-19 | Discharge: 2023-05-19 | Disposition: A | Discharge status: Home / Self Care | Source: Ambulatory Visit | Attending: Family Medicine | Admitting: Family Medicine

## 2023-05-19 ENCOUNTER — Encounter

## 2023-05-19 DIAGNOSIS — Z1231 Encounter for screening mammogram for malignant neoplasm of breast: Secondary | ICD-10-CM | POA: Diagnosis not present

## 2023-06-05 NOTE — Progress Notes (Unsigned)
   Established Patient Office Visit  Subjective   Patient ID: Amy Hodge, female    DOB: [birth year]  Age: 68 y.o. MRN: [id number]  No chief complaint on file.   HPI   The ASCVD Risk score (Arnett DK, et al., 2019) failed to calculate for the following reasons:   Risk score cannot be calculated because patient has a medical history suggesting prior/existing ASCVD  Health Maintenance Due  Topic Date Due   Zoster Vaccines- Shingrix (1 of 2) Never done   COVID-19 Vaccine (3 - Pfizer risk series) 05/04/2020   Medicare Annual Wellness (AWV)  07/26/2023      Objective:     There were no vitals taken for this visit. {Vitals History (Optional):23777}  Physical Exam   No results found for any visits on 06/06/23.      Assessment & Plan:   There are no diagnoses linked to this encounter.   No follow-ups on file.    Sandre Kitty, MD

## 2023-06-06 ENCOUNTER — Ambulatory Visit (INDEPENDENT_AMBULATORY_CARE_PROVIDER_SITE_OTHER): Payer: Medicare HMO | Admitting: Family Medicine

## 2023-06-06 ENCOUNTER — Encounter: Payer: Self-pay | Admitting: Family Medicine

## 2023-06-06 VITALS — BP 119/77 | HR 83 | Ht 66.5 in | Wt 181.1 lb

## 2023-06-06 DIAGNOSIS — M62830 Muscle spasm of back: Secondary | ICD-10-CM | POA: Diagnosis not present

## 2023-06-06 DIAGNOSIS — E66811 Obesity, class 1: Secondary | ICD-10-CM

## 2023-06-06 MED ORDER — CYCLOBENZAPRINE HCL 10 MG PO TABS: ORAL_TABLET | ORAL | 1 refills | Status: AC

## 2023-06-06 NOTE — Patient Instructions (Signed)
 It was nice to see you today,  We addressed the following topics today: -We will not make any changes to your medication at this time. - In about 6 months we will have you come back to discuss your management of chronic issues   Have a great day,  Frederic Jericho, MD

## 2023-06-06 NOTE — Assessment & Plan Note (Signed)
 Continue Wegovy 1 mg.  Continues to lose weight.  Counseled on diet and exercise.  Follow-up in 6 months.

## 2023-06-14 ENCOUNTER — Ambulatory Visit: Payer: Medicare HMO | Admitting: Obstetrics

## 2023-06-14 ENCOUNTER — Encounter: Payer: Self-pay | Admitting: Obstetrics

## 2023-06-14 VITALS — BP 111/79 | HR 80 | Ht 64.96 in | Wt 182.3 lb

## 2023-06-14 DIAGNOSIS — R3914 Feeling of incomplete bladder emptying: Secondary | ICD-10-CM | POA: Insufficient documentation

## 2023-06-14 DIAGNOSIS — N3946 Mixed incontinence: Secondary | ICD-10-CM | POA: Diagnosis not present

## 2023-06-14 DIAGNOSIS — N816 Rectocele: Secondary | ICD-10-CM | POA: Insufficient documentation

## 2023-06-14 DIAGNOSIS — R351 Nocturia: Secondary | ICD-10-CM | POA: Insufficient documentation

## 2023-06-14 DIAGNOSIS — M5416 Radiculopathy, lumbar region: Secondary | ICD-10-CM

## 2023-06-14 DIAGNOSIS — R102 Pelvic and perineal pain: Secondary | ICD-10-CM | POA: Insufficient documentation

## 2023-06-14 DIAGNOSIS — N952 Postmenopausal atrophic vaginitis: Secondary | ICD-10-CM | POA: Insufficient documentation

## 2023-06-14 LAB — POCT URINALYSIS DIPSTICK
Bilirubin, UA: NEGATIVE
Bilirubin, UA: NEGATIVE
Blood, UA: NEGATIVE
Blood, UA: NEGATIVE
Glucose, UA: NEGATIVE
Glucose, UA: NEGATIVE
Ketones, UA: NEGATIVE
Ketones, UA: NEGATIVE
Leukocytes, UA: NEGATIVE
Nitrite, UA: NEGATIVE
Nitrite, UA: NEGATIVE
Protein, UA: NEGATIVE
Protein, UA: NEGATIVE
Spec Grav, UA: 1.01 (ref 1.010–1.025)
Spec Grav, UA: 1.015 (ref 1.010–1.025)
Urobilinogen, UA: 0.2 U/dL
Urobilinogen, UA: 0.2 U/dL
pH, UA: 5.5 (ref 5.0–8.0)
pH, UA: 5.5 (ref 5.0–8.0)

## 2023-06-14 MED ORDER — ESTRADIOL 0.1 MG/GM VA CREA: TOPICAL_CREAM | VAGINAL | 3 refills | Status: DC

## 2023-06-14 NOTE — Assessment & Plan Note (Signed)
 For night time frequency: - avoid fluid intake 3 hours before bedtime - switch diuretic (e.g. spironolactone) dosing to 2pm - denies snoring, prior sleep study with mild sleep apnea - consider repeat testing and encouraged to continue weight loss

## 2023-06-14 NOTE — Patient Instructions (Addendum)
 For irritative bladder we reviewed treatment options including altering her diet to avoid irritative beverages and foods as well as attempting to decrease stress and other exacerbating factors.  We also discussed using pyridium and similar over-the-counter medications for pain relief as needed. We discussed the pentad of medications including Elmiron, Tums, an antihistamine such as Vistaril, amitriptyline, and L-arginine.  We also discussed in-office bladder instillations for pain flares, as well as cystoscopy with hydrodistention in the operating room, which can be both diagnostic and therapeutic. She was also given information on the IC Network at https://www.ic-network.com for bladder diet suggestions and patient forums for support.   The origin of pelvic floor muscle spasm can be multifactorial, including primary, reactive to a different pain source, trauma, or even part of a centralized pain syndrome.Treatment options include pelvic floor physical therapy, local (vaginal) or oral  muscle relaxants, pelvic muscle trigger point injections or centrally acting pain medications.     For vaginal atrophy (thinning of the vaginal tissue that can cause dryness and burning) and UTI prevention we discussed estrogen replacement in the form of vaginal cream.   Start vaginal estrogen therapy nightly for two weeks then 2 times weekly at night. This can be placed with your finger or an applicator inside the vagina and around the urethra.  Please let us know if the prescription is too expensive and we can look for alternative options.   Is vaginal estrogen therapy safe for me? Vaginal estrogen preparations act on the vaginal skin, and only a very tiny amount is absorbed into the bloodstream (0.01%).  They work in a similar way to hand or face cream.  There is minimal absorption and they are therefore perfectly safe. If you have had breast cancer and have persistent troublesome symptoms which aren't settling with  vaginal moisturisers and lubricants, local estrogen treatment may be a possibility, but consultation with your oncologist should take place first.   We discussed the symptoms of overactive bladder (OAB), which include urinary urgency, urinary frequency, night-time urination, with or without urge incontinence.  We discussed management including behavioral therapy (decreasing bladder irritants by following a bladder diet, urge suppression strategies, timed voids, bladder retraining), physical therapy, medication; and for refractory cases posterior tibial nerve stimulation, sacral neuromodulation, and intravesical botulinum toxin injection.   Continue Gemtesa daily.   For night time frequency: - avoid fluid intake 3 hours before bedtime - switch your diuretic (e.g. spironolactone) dosing to 2pm  You have a stage 2 (out of 4) prolapse.  We discussed the fact that it is not life threatening but there are several treatment options. For treatment of pelvic organ prolapse, we discussed options for management including expectant management, conservative management, and surgical management, such as Kegels, a pessary, pelvic floor physical therapy, and specific surgical procedures.     Constipation: Our goal is to achieve formed bowel movements daily or every-other-day.  You may need to try different combinations of the following options to find what works best for you - everybody's body works differently so feel free to adjust the dosages as needed.  Some options to help maintain bowel health include:  Dietary changes (more leafy greens, vegetables and fruits; less processed foods) Fiber supplementation (Benefiber, FiberCon, Metamucil or Psyllium). Start slow and increase gradually to full dose. Over-the-counter agents such as: stool softeners (Docusate or Colace) and/or laxatives (Miralax, milk of magnesia)  "Power Pudding" is a natural mixture that may help your constipation.  To make blend 1 cup  applesauce, 1  cup wheat bran, and 3/4 cup prune juice, refrigerate and then take 1 tablespoon daily with a large glass of water as needed.   Women should try to eat at least 21 to 25 grams of fiber a day, while men should aim for 30 to 38 grams a day. You can add fiber to your diet with food or a fiber supplement such as psyllium (metamucil), benefiber, or fibercon.   Here's a look at how much dietary fiber is found in some common foods. When buying packaged foods, check the Nutrition Facts label for fiber content. It can vary among brands.  Fruits Serving size Total fiber (grams)*  Raspberries 1 cup 8.0  Pear 1 medium 5.5  Apple, with skin 1 medium 4.5  Banana 1 medium 3.0  Orange 1 medium 3.0  Strawberries 1 cup 3.0   Vegetables Serving size Total fiber (grams)*  Green peas, boiled 1 cup 9.0  Broccoli, boiled 1 cup chopped 5.0  Turnip greens, boiled 1 cup 5.0  Brussels sprouts, boiled 1 cup 4.0  Potato, with skin, baked 1 medium 4.0  Sweet corn, boiled 1 cup 3.5  Cauliflower, raw 1 cup chopped 2.0  Carrot, raw 1 medium 1.5   Grains Serving size Total fiber (grams)*  Spaghetti, whole-wheat, cooked 1 cup 6.0  Barley, pearled, cooked 1 cup 6.0  Bran flakes 3/4 cup 5.5  Quinoa, cooked 1 cup 5.0  Oat bran muffin 1 medium 5.0  Oatmeal, instant, cooked 1 cup 5.0  Popcorn, air-popped 3 cups 3.5  Brown rice, cooked 1 cup 3.5  Bread, whole-wheat 1 slice 2.0  Bread, rye 1 slice 2.0   Legumes, nuts and seeds Serving size Total fiber (grams)*  Split peas, boiled 1 cup 16.0  Lentils, boiled 1 cup 15.5  Black beans, boiled 1 cup 15.0  Baked beans, canned 1 cup 10.0  Chia seeds 1 ounce 10.0  Almonds 1 ounce (23 nuts) 3.5  Pistachios 1 ounce (49 nuts) 3.0  Sunflower kernels 1 ounce 3.0  *Rounded to nearest 0.5 gram. Source: [employer] for [employer], [employer]	[place]

## 2023-06-14 NOTE — Progress Notes (Signed)
 New Patient Evaluation and Consultation  Referring Provider: Melida Quitter, PA PCP: Melida Quitter, PA Date of Service: 06/14/2023  SUBJECTIVE Chief Complaint: New Patient (Initial Visit) Amy Hodge is a 68 y.o. female here today for urinary urge incontinence. Has dysuria all the time. )  History of Present Illness: Amy Hodge is a 68 y.o. White or Caucasian female seen in consultation at the request of PA Jairo Ben for evaluation of urinary urgency, incontinence, and bladder pain.    Reports UTI symptoms with negative urine cultures for 10 years.  UTIs symptoms: urgency with pain, pain radiates to her legs, wakes her up at night with pressure in her chest. Pressure alleviated by voiding.  Denies hematuria.  Azo with some pain relief, takes it 3 weeks/week Denies PFPT, instillations, oral medications other than antibiotics. Some relief in the past from antibiotics Denies changes in medication, surgeries,  Underwent TAH with BSO for "precancerous cervical cells", denies chemotherapy or radiation around 50 years ago. Denies hormonal replacement.  Reports MIs in the past 10 years  Reports fall around 1 year around with broken wrist and fell on her bottom with bruising on her left lower extremity. Urgency urinary leakage started around 1 year ago, denies perineal numbness or urinary retention.  OAB medication with Leslye Peer started 2 months ago, denies relief of leakage, frequency or urgency.  Reports smoking 20 years ago, 2-3 packs/day from 68yo to late 30s.  Put her 14yo dog down around 2 weeks ago Takes amitriptyline 50mg  at bedtime for mood disorder and insomnia  Review of records significant for: Currently on Wegovy for weight loss with h/o postoperative hypothyroidism, history of MI, mild OSA, history of aortic aneurysm, h/o  lumbar radiculopathy   Urinary Symptoms: Leaks urine with cough/ sneeze, with movement to the bathroom, and with urgency, more pain than leaking  started 1 year ago Leaks 3 time(s) per days with small volume leakage Pad use: 3 liners/ mini-pads per day.   Patient is bothered by UI symptoms.  Day time voids 6-7.  Nocturia: >4 times per night to void for several years when she wakes up from shoulder and chest discomfort that resolves with voiding.  Denies snoring or OSA. Mild OSA per review of testing in 2017 managed by Dr. Maple Hudson ([facility]) in 2021 Drinks water and clear sodas until bedtime Voiding dysfunction:  does not empty bladder well since fall Patient does not use a catheter to empty bladder.  When urinating, patient feels difficulty starting urine stream, the need to urinate multiple times in a row, and to push on her belly or vagina to empty bladder Drinks: 64oz water per day, 40oz clear soda with starry and sprite, 1 bottle of Coke at lunch  UTIs:  0  UTI's in the last year.   Denies history of blood in urine, pyelonephritis, bladder cancer, and kidney cancer Reports history of kidney stones 6 months postpartum around 46 yr ago, denies surgical intervention.  No results found for the last 90 days.   Pelvic Organ Prolapse Symptoms:                  Patient Admits to a feeling of a pea to cherry tomato bulge the vaginal area. It has been present for <1  year , but > 6 months Patient Admits to seeing a bulge and feeling it when she wipes This bulge is bothersome.  Bowel Symptom: Bowel movements: 2 time(s) per week with type IV and VI-VII stool for around  1 year Stool consistency: soft  Straining: yes.  Splinting: no Incomplete evacuation: no.  Patient Denies accidental bowel leakage / fecal incontinence Bowel regimen: none, stool softener in the past Last colonoscopy [facility]          Upcoming     Colonoscopy (Every 5 Years) Next due on 11/22/2027    11/22/2022  [facility] Colonoscopy component of [facility]OSCOPY   Only the first 1 history entries have been loaded, but more history exists.                Sexual  Function Sexually active: no. Widowed for 3 years  Sexual orientation: Straight Pain with sex: deep penetration Yes, improved after hysterectomy  Pelvic Pain Admits to pelvic pain 5/10 Location: lower abdomen with urgency Pain occurs: sensation of urgency to void Prior pain treatment: Azo, Tylenol Improved by: Azo Worsened by: delaying urge or staying on her feet   Past Medical History:  Past Medical History:  Diagnosis Date   Allergy    Anxiety    COPD (chronic obstructive pulmonary disease) (HCC)    Coronary artery disease    a. s/p PCI of RCA in 2015 b. cath in 12/2015 showed 100% Ost RPDA with collaterals present   DDD (degenerative disc disease), cervical    Depression    DJD (degenerative joint disease) of knee    RT   GERD (gastroesophageal reflux disease)    Hashimoto's disease    Headache(784.0)    Hyperlipidemia    Hypertension    Hypothyroidism    Myocardial infarction (HCC)    Nocturnal hypoxemia 02/26/2015   On supplemental oxygen therapy    Oxygen 2.5 l/m at bedtime.   Stented coronary artery 02/26/2015     Past Surgical History:   Past Surgical History:  Procedure Laterality Date   ABDOMINAL HYSTERECTOMY     APPENDECTOMY  03/07/1969   BUNIONECTOMY Right    CARDIAC CATHETERIZATION N/A 09/02/2014   Procedure: Left Heart Cath and Coronary Angiography;  Surgeon: Yates Decamp, MD;  Location: [facility] INVASIVE CV LAB;  Service: Cardiovascular;  Laterality: N/A;   CARDIAC CATHETERIZATION N/A 12/15/2015   Procedure: Left Heart Cath and Coronary Angiography;  Surgeon: Corky Crafts, MD;  Location: [facility] INVASIVE CV LAB;  Service: Cardiovascular;  Laterality: N/A;   CERVICAL FUSION     CESAREAN SECTION     COLONOSCOPY WITH PROPOFOL N/A 02/24/2015   Procedure: COLONOSCOPY WITH PROPOFOL;  Surgeon: Charolett Bumpers, MD;  Location: [facility] ENDOSCOPY;  Service: Endoscopy;  Laterality: N/A;   ESOPHAGOGASTRODUODENOSCOPY (EGD) WITH PROPOFOL N/A 02/24/2015   Procedure:  ESOPHAGOGASTRODUODENOSCOPY (EGD) WITH PROPOFOL;  Surgeon: Charolett Bumpers, MD;  Location: [facility] ENDOSCOPY;  Service: Endoscopy;  Laterality: N/A;   HAND SURGERY     due to trauma-dog bite.   I & D EXTREMITY Bilateral 05/17/2013   Procedure: IRRIGATION AND DEBRIDEMENT EXTREMITY;  Surgeon: Dominica Severin, MD;  Location: [facility] OR;  Service: Orthopedics;  Laterality: Bilateral;   LEFT HEART CATHETERIZATION WITH CORONARY ANGIOGRAM N/A 03/04/2014   Procedure: LEFT HEART CATHETERIZATION WITH CORONARY ANGIOGRAM;  Surgeon: Pamella Pert, MD;  Location: [facility] CATH LAB;  Service: Cardiovascular;  Laterality: N/A;   OOPHORECTOMY     SHOULDER SURGERY     RT   THYROIDECTOMY  03/07/1994   TONSILLECTOMY  03/08/1963   TOTAL ABDOMINAL HYSTERECTOMY W/ BILATERAL SALPINGOOPHORECTOMY  01/06/2008   with cervical dysplasia   TUBAL LIGATION  03/07/1982     Past OB/GYN History: OB History  Gravida  Para Term Preterm AB Living  1 1 1   1   SAB IAB Ectopic Multiple Live Births      1    # Outcome Date GA Lbr Len/2nd Weight Sex Type Anes PTL Lv  1 Term     F CS-LTranv   LIV    Vaginal deliveries: 0,  Forceps/ Vacuum deliveries: 0, Cesarean section: 1 Menopausal: Yes, at age 42, Denies vaginal bleeding since menopause Contraception: s/p menopause. Last pap smear was 2011 normal per chart review.  Any history of abnormal pap smears: yes. No results found for: "DIAGPAP", "HPVHIGH", "ADEQPAP"  Medications: Patient has a current medication list which includes the following prescription(s): acetaminophen, albuterol, amitriptyline, aspirin ec, b complex vitamins, bupropion, chlorpheniramine-hydrocodone, vitamin d3, cyclobenzaprine, duloxetine, [START ON 06/15/2023] estradiol, fexofenadine, hydroxyzine, levothyroxine, losartan, lysine, multivitamin, nitroglycerin, omeprazole, spironolactone, vibegron, and wegovy.   Allergies: Patient is allergic to bactrim [sulfamethoxazole-trimethoprim], ketorolac, anoro ellipta  [umeclidinium-vilanterol], morphine, sulfamethoxazole, tetracyclines & related, trimethoprim, valsartan, bystolic [nebivolol hcl], isosorbide, levofloxacin, lisinopril, metoprolol, morphine and codeine, simvastatin, singulair [montelukast sodium], statins, tetracycline, and toradol [ketorolac tromethamine].   Social History:  Social History   Tobacco Use   Smoking status: Former    Current packs/day: 0.00    Average packs/day: 2.0 packs/day for 25.0 years (50.0 ttl pk-yrs)    Types: Cigarettes    Start date: 04/24/1971    Quit date: 04/23/1996    Years since quitting: 27.1    Passive exposure: Never   Smokeless tobacco: Never  Vaping Use   Vaping status: Never Used  Substance Use Topics   Alcohol use: No    Alcohol/week: 0.0 standard drinks of alcohol   Drug use: No    Relationship status: widowed Patient lives alone.   Patient is not employed. Regular exercise: Yes:   History of abuse: No  Family History:   Family History  Problem Relation Age of Onset   Lung cancer Mother    Hypertension Mother    Stroke Mother    Diabetes Mother    Diabetes Father    Hypertension Father    Heart attack Father    Asthma Father    COPD Father    Alcohol abuse Father    Breast cancer Sister 31   Diabetes Sister    Hypertension Sister    Uterine cancer Neg Hx    Bladder Cancer Neg Hx    Renal cancer Neg Hx      Review of Systems: Review of Systems  Constitutional:  Positive for weight loss. Negative for fever and malaise/fatigue.  Respiratory:  Positive for shortness of breath. Negative for cough and wheezing.   Cardiovascular:  Negative for chest pain, palpitations and leg swelling.  Gastrointestinal:  Positive for abdominal pain. Negative for blood in stool and constipation.  Genitourinary:  Positive for dysuria, frequency and urgency. Negative for hematuria.  Skin:  Negative for rash.  Neurological:  Positive for dizziness, weakness and headaches.  Endo/Heme/Allergies:   Bruises/bleeds easily.  Psychiatric/Behavioral:  Negative for depression. The patient is not nervous/anxious.      OBJECTIVE Physical Exam: Vitals:   06/14/23 0800  BP: 111/79  Pulse: 80  Weight: 182 lb 4.8 oz (82.7 kg)  Height: 5' 4.96" (1.65 m)   Physical Exam Constitutional:      General: She is not in acute distress.    Appearance: Normal appearance.  Genitourinary:     Bladder and urethral meatus normal.     No lesions in the vagina.  Right Labia: No rash, tenderness, lesions, skin changes or Bartholin's cyst.    Left Labia: No tenderness, lesions, skin changes, Bartholin's cyst or rash.    No vaginal discharge, erythema, tenderness, bleeding, ulceration or granulation tissue.     Anterior and posterior vaginal prolapse present.    Severe vaginal atrophy present.     Right Adnexa: not tender, not full and no mass present.    Left Adnexa: not tender, not full and no mass present.    No cervical motion tenderness, discharge, friability, lesion, polyp or nabothian cyst.     Uterus is not enlarged, fixed, tender or irregular.     No uterine mass detected.    Urethral meatus caruncle not present.    No urethral prolapse, tenderness, mass, hypermobility, discharge or stress urinary incontinence with cough stress test present.     Bladder is not tender, urgency on palpation not present and masses not present.      Levator ani is tender and obturator internus is tender.     No asymmetrical contractions present and no pelvic spasms present.    Symmetrical pelvic sensation, anal wink present and BC reflex present. Cardiovascular:     Rate and Rhythm: Normal rate.  Pulmonary:     Effort: Pulmonary effort is normal. No respiratory distress.  Abdominal:     General: There is no distension.     Palpations: There is no mass.     Tenderness: There is no abdominal tenderness.     Hernia: No hernia is present.    Musculoskeletal:       Legs:  Neurological:     Mental  Status: She is alert.  Vitals reviewed. Exam conducted with a chaperone present.      POP-Q:   POP-Q  -1                                            Aa   -1                                           Ba  -6                                              C   2                                            Gh  4                                            Pb  9                                            tvl   0  Ap  0                                            Bp                                                 D    Post-Void Residual (PVR) by Bladder Scan: In order to evaluate bladder emptying, we discussed obtaining a postvoid residual and patient agreed to this procedure.  Procedure: The ultrasound unit was placed on the patient's abdomen in the suprapubic region after the patient had voided.    Post Void Residual - 06/14/23 0816       Post Void Residual   Post Void Residual 14 mL            Straight Catheterization Procedure for PVR: After verbal consent was obtained from the patient for catheterization to assess bladder emptying and residual volume the urethra and surrounding tissues were visualized and an in and out catheterization was performed.  PVR was 80mL.  Urine appeared clear yellow. The patient tolerated the procedure well.   Laboratory Results: Lab Results  Component Value Date   COLORU Yellow 06/14/2023   CLARITYU Clear 06/14/2023   GLUCOSEUR Negative 06/14/2023   BILIRUBINUR Negative 06/14/2023   KETONESU Negative 06/14/2023   SPECGRAV 1.010 06/14/2023   RBCUR Negative 06/14/2023   PHUR 5.5 06/14/2023   PROTEINUR Negative 06/14/2023   UROBILINOGEN 0.2 06/14/2023   LEUKOCYTESUR Negative 06/14/2023    Lab Results  Component Value Date   CREATININE 0.82 03/17/2023   CREATININE 0.72 12/15/2022   CREATININE 0.89 08/03/2022    Lab Results  Component Value Date   HGBA1C 5.3 03/17/2023    Lab  Results  Component Value Date   HGB 14.2 05/19/2022     ASSESSMENT AND PLAN Ms. Hendriks is a 68 y.o. with:  1. Mixed stress and urge urinary incontinence   2. Pelvic pain   3. Feeling of incomplete bladder emptying   4. Vaginal atrophy   5. Nocturia   6. Pelvic organ prolapse quantification stage 2 rectocele   7. Lumbar radiculopathy     Mixed stress and urge urinary incontinence Assessment & Plan: - POCT UA + leuk, PVR 80mL - continue Gemtesa - We discussed the symptoms of overactive bladder (OAB), which include urinary urgency, urinary frequency, nocturia, with or without urge incontinence.  While we do not know the exact etiology of OAB, several treatment options exist. We discussed management including behavioral therapy (decreasing bladder irritants, urge suppression strategies, timed voids, bladder retraining), physical therapy, medication; for refractory cases posterior tibial nerve stimulation, sacral neuromodulation, and intravesical botulinum toxin injection.  For anticholinergic medications, we discussed the potential side effects of anticholinergics including dry eyes, dry mouth, constipation, cognitive impairment and urinary retention. For Beta-3 agonist medication, we discussed the potential side effect of elevated blood pressure which is more likely to occur in individuals with uncontrolled hypertension. - encouraged fluid management and caffeine reduction - encouraged pelvic floor relaxation exercises, avoid Kegels - optimize stool consistency and reduce straining  Orders: -     POCT urinalysis dipstick -     Estradiol; Place 1g nightly for two weeks then twice a week after  Dispense: 30  g; Refill: 3 -     AMB referral to rehabilitation -     POCT urinalysis dipstick  Pelvic pain Assessment & Plan: - UTI symptoms described as pain with urgency that radiates down her legs and chest pressure at night with negative urine testing - For irritative bladder we reviewed  treatment options including altering her diet to avoid irritative beverages and foods as well as attempting to decrease stress and other exacerbating factors.  We also discussed using pyridium and similar over-the-counter medications for pain relief as needed. We discussed the pentad of medications including Elmiron, Tums, an antihistamine such as Vistaril, amitriptyline, and L-arginine.  We also discussed in-office bladder instillations for pain flares, as well as cystoscopy with hydrodistention in the operating room, which can be both diagnostic and therapeutic. She was also given information on the IC Network at https://www.ic-network.com for bladder diet suggestions and patient forums for support. - encouraged fluid management and reduction of carbonated beverages - declined pelvic floor PT due to cost - encouraged to consider bladder instillation - scheduled cystoscopy due to acute onset of symptoms 1 year ago, persistent LUTS, and history of tobacco use. - has hydroxyzine Rx uses PRN for anxiety, encouraged trial of nightly use to reassess symptoms - The origin of pelvic floor muscle spasm can be multifactorial, including primary, reactive to a different pain source, trauma, or even part of a centralized pain syndrome.Treatment options include pelvic floor physical therapy, local (vaginal) or oral  muscle relaxants, pelvic muscle trigger point injections or centrally acting pain medications.   - discussed bladder and pelvic pain in the setting of baseline musculoskeletal pain  Orders: -     Estradiol; Place 1g nightly for two weeks then twice a week after  Dispense: 30 g; Refill: 3 -     AMB referral to rehabilitation  Feeling of incomplete bladder emptying Assessment & Plan: - catheterized for 80mL - associated with palpation of pelvic floor muscles bilaterally  - reassess at follow-up - continue Gemtesa - consider association with Amitriptyline  Orders: -     AMB referral to  rehabilitation  Vaginal atrophy Assessment & Plan: - For symptomatic vaginal atrophy options include lubrication with a water-based lubricant, personal hygiene measures and barrier protection against wetness, and estrogen replacement in the form of vaginal cream, vaginal tablets, or a time-released vaginal ring.   - Rx low dose vaginal estrogen  Orders: -     Estradiol; Place 1g nightly for two weeks then twice a week after  Dispense: 30 g; Refill: 3  Nocturia Assessment & Plan: For night time frequency: - avoid fluid intake 3 hours before bedtime - switch diuretic (e.g. spironolactone) dosing to 2pm - denies snoring, prior sleep study with mild sleep apnea - consider repeat testing and encouraged to continue weight loss    Pelvic organ prolapse quantification stage 2 rectocele Assessment & Plan: - Bristol IV, VI and VII with straining 2x/week - For treatment of pelvic organ prolapse, we discussed options for management including expectant management, conservative management, and surgical management, such as Kegels, a pessary, pelvic floor physical therapy, and specific surgical procedures. - encouraged titration of fiber supplementation - continue squatting position for bowel movements and minimize straining    Lumbar radiculopathy Assessment & Plan: - reproducible Left sided SI joint pain - encouraged sleeping with pillow between her legs at night and continuing weight loss - encourage core strengthening exercises to stabilize hips   Time spent: I spent 65 minutes dedicated to  the care of this patient on the date of this encounter to include pre-visit review of records, face-to-face time with the patient discussing bladder pain syndrome and post visit documentation and ordering medication/ testing.   Loleta Chance, MD

## 2023-06-14 NOTE — Assessment & Plan Note (Signed)
-   For symptomatic vaginal atrophy options include lubrication with a water-based lubricant, personal hygiene measures and barrier protection against wetness, and estrogen replacement in the form of vaginal cream, vaginal tablets, or a time-released vaginal ring.   - Rx low dose vaginal estrogen

## 2023-06-14 NOTE — Assessment & Plan Note (Addendum)
-   Bristol IV, VI and VII with straining 2x/week - For treatment of pelvic organ prolapse, we discussed options for management including expectant management, conservative management, and surgical management, such as Kegels, a pessary, pelvic floor physical therapy, and specific surgical procedures. - encouraged titration of fiber supplementation - continue squatting position for bowel movements and minimize straining

## 2023-06-14 NOTE — Assessment & Plan Note (Addendum)
-   UTI symptoms described as pain with urgency that radiates down her legs and chest pressure at night with negative urine testing - For irritative bladder we reviewed treatment options including altering her diet to avoid irritative beverages and foods as well as attempting to decrease stress and other exacerbating factors.  We also discussed using pyridium and similar over-the-counter medications for pain relief as needed. We discussed the pentad of medications including Elmiron, Tums, an antihistamine such as Vistaril, amitriptyline, and L-arginine.  We also discussed in-office bladder instillations for pain flares, as well as cystoscopy with hydrodistention in the operating room, which can be both diagnostic and therapeutic. She was also given information on the IC Network at https://www.ic-network.com for bladder diet suggestions and patient forums for support. - encouraged fluid management and reduction of carbonated beverages - declined pelvic floor PT due to cost - encouraged to consider bladder instillation - scheduled cystoscopy due to acute onset of symptoms 1 year ago, persistent LUTS, and history of tobacco use. - has hydroxyzine Rx uses PRN for anxiety, encouraged trial of nightly use to reassess symptoms - The origin of pelvic floor muscle spasm can be multifactorial, including primary, reactive to a different pain source, trauma, or even part of a centralized pain syndrome.Treatment options include pelvic floor physical therapy, local (vaginal) or oral  muscle relaxants, pelvic muscle trigger point injections or centrally acting pain medications.   - discussed bladder and pelvic pain in the setting of baseline musculoskeletal pain

## 2023-06-14 NOTE — Assessment & Plan Note (Signed)
-   POCT UA + leuk, PVR 80mL - continue Gemtesa - We discussed the symptoms of overactive bladder (OAB), which include urinary urgency, urinary frequency, nocturia, with or without urge incontinence.  While we do not know the exact etiology of OAB, several treatment options exist. We discussed management including behavioral therapy (decreasing bladder irritants, urge suppression strategies, timed voids, bladder retraining), physical therapy, medication; for refractory cases posterior tibial nerve stimulation, sacral neuromodulation, and intravesical botulinum toxin injection.  For anticholinergic medications, we discussed the potential side effects of anticholinergics including dry eyes, dry mouth, constipation, cognitive impairment and urinary retention. For Beta-3 agonist medication, we discussed the potential side effect of elevated blood pressure which is more likely to occur in individuals with uncontrolled hypertension. - encouraged fluid management and caffeine reduction - encouraged pelvic floor relaxation exercises, avoid Kegels - optimize stool consistency and reduce straining

## 2023-06-14 NOTE — Assessment & Plan Note (Signed)
-   reproducible Left sided SI joint pain - encouraged sleeping with pillow between her legs at night and continuing weight loss - encourage core strengthening exercises to stabilize hips

## 2023-06-14 NOTE — Assessment & Plan Note (Signed)
-   catheterized for 80mL - associated with palpation of pelvic floor muscles bilaterally  - reassess at follow-up - continue Gemtesa - consider association with Amitriptyline

## 2023-06-23 ENCOUNTER — Ambulatory Visit: Payer: Medicare HMO | Admitting: Family Medicine

## 2023-06-27 ENCOUNTER — Telehealth: Payer: Self-pay

## 2023-06-27 NOTE — Telephone Encounter (Signed)
 Amy Hodge is called to provide pre-procedural instructions for her [x] Cystoscopy, [] Bladder Botox or [] Urethral Bulking. She is scheduled on 06/29/2023.  The patient is not having worsening urinary symptoms to her urinary patterns. The patient is not experiencing  UTI symptoms . [] The patient is scheduled to come in 2-3 days prior for a non-provider visit to run a POC Urinalysis.  [] The patient cannot come in for a pre-procedural Urinalysis therefore discussed with provider to determine treatment prior to procedure.   [] The patient has been prescribed the pre-procedural antibiotics. The patient is reminded to take the prescribed antibiotics the morning of the procedure, an hour before and the morning of the following day.   [] The patient has not previously been prescribed the pre-procedural anitbiotics, review with patient her medication allergies to determine the antibiotic prescription needed.  [] Since no allergies, a prescription for Bactrim DS 500mg  #2, take 1 the morning of the procedure and 1 the morning after the procedure and one the morning after the procedure.  [] Since the patient is allergic to Sulfa, the patient will be prescribed Macrobid  100mg  #2, take 1 the morning of the procedure and 1 the morning after the procedure.    The patient is reminded to arrive 5 minutes prior to the scheduled appointment time and that a urine sample will need to be collected the upon arrival for the procedure.

## 2023-06-28 NOTE — Progress Notes (Unsigned)
 CYSTOSCOPY  CC:  This is a 68 y.o. with  acute onset of lower urinary tract symptoms with bladder pain  who presents today for cystoscopy.  Lab Results  Component Value Date   COLORU Yellow 06/29/2023   CLARITYU Clear 06/29/2023   GLUCOSEUR Negative 06/29/2023   BILIRUBINUR Negative 06/29/2023   KETONESU Negative 06/29/2023   SPECGRAV <=1.005 (A) 06/29/2023   RBCUR Negative 06/29/2023   PHUR 5.5 06/29/2023   PROTEINUR Negative 06/29/2023   UROBILINOGEN 0.2 06/29/2023   LEUKOCYTESUR Trace (A) 06/29/2023    BP 104/72   Pulse 83   CYSTOSCOPY: A time out was performed.  The periurethral area was prepped and draped in a sterile manner.  2% lidocaine  jetpack was inserted at the urethral meatus and the urethra and bladder visualized with 12- and 70-degree scopes.  She reduced urethral coaptation and normal urethral mucosa.  She had abnormal  generalized, circular, well demarcated, erythematous  bladder mucosa without bleeding. She had bilateral clear efflux from both ureteral orifices.  She had no squamous metaplasia at the trigone, no trabeculations, cellules or diverticuli.    ASSESSMENT:  68 y.o. with bladder pain. Cystoscopy today is abnormal  with generalized erythematous lesions .  PLAN:  Follow-up to discuss findings and treatment options.  All questions answered and post-procedures instructions were given - continue vaginal estrogen 1g twice a week - encouraged to consider bladder instillation - consider resuming hydroxyzine   - pelvic floor PT cost prohibitive - recommended cystoscopy with bladder biopsy - For irritative bladder we reviewed treatment options including altering her diet to avoid irritative beverages and foods as well as attempting to decrease stress and other exacerbating factors.  We also discussed using pyridium and similar over-the-counter medications for pain relief as needed. We discussed the pentad of medications including Tums, an antihistamine such as  Vistaril , amitriptyline , and L-arginine.  We also discussed in-office bladder instillations for pain flares, as well as cystoscopy with hydrodistention in the operating room, which can be both diagnostic and therapeutic. She desires to proceed with hydrodistention. We discussed the risk of urinary retention, hematuria, and bladder rupture. - referral sent for surgery scheduling, pending vacation and prefer to postpone until after 5/21   Darlene Ehlers, MD

## 2023-06-29 ENCOUNTER — Ambulatory Visit: Admitting: Obstetrics

## 2023-06-29 ENCOUNTER — Encounter: Payer: Self-pay | Admitting: Obstetrics

## 2023-06-29 VITALS — BP 104/72 | HR 83

## 2023-06-29 DIAGNOSIS — R3989 Other symptoms and signs involving the genitourinary system: Secondary | ICD-10-CM

## 2023-06-29 DIAGNOSIS — R3914 Feeling of incomplete bladder emptying: Secondary | ICD-10-CM

## 2023-06-29 DIAGNOSIS — R9341 Abnormal radiologic findings on diagnostic imaging of renal pelvis, ureter, or bladder: Secondary | ICD-10-CM | POA: Diagnosis not present

## 2023-06-29 DIAGNOSIS — R351 Nocturia: Secondary | ICD-10-CM

## 2023-06-29 LAB — POCT URINALYSIS DIPSTICK
Bilirubin, UA: NEGATIVE
Blood, UA: NEGATIVE
Glucose, UA: NEGATIVE
Ketones, UA: NEGATIVE
Nitrite, UA: POSITIVE
Protein, UA: NEGATIVE
Spec Grav, UA: <=1.005 — AB (ref 1.010–1.025)
Urobilinogen, UA: 0.2 U/dL
pH, UA: 5.5 (ref 5.0–8.0)

## 2023-06-29 NOTE — Patient Instructions (Addendum)
 Taking Care of Yourself after Urodynamics, Cystoscopy, Bulkamid Injection, or Botox Injection   Drink plenty of water for a day or two following your procedure. Try to have about 8 ounces (one cup) at a time, and do this 6 times or more per day unless you have fluid restrictitons AVOID irritative beverages such as coffee, tea, soda, alcoholic or citrus drinks for a day or two, as this may cause burning with urination.  For the first 1-2 days after the procedure, your urine may be pink or red in color. You may have some blood in your urine as a normal side effect of the procedure. Large amounts of bleeding or difficulty urinating are NOT normal. Call the nurse line if this happens or go to the nearest Emergency Room if the bleeding is heavy or you cannot urinate at all and it is after hours. If you had a Bulkamid injection in the urethra and need to be catheterized, ask for a pediatric catheter to be used (size 10 or 12-French) so the material is not pushed out of place.   You may experience some discomfort or a burning sensation with urination after having this procedure. You can use over the counter Azo or pyridium to help with burning and follow the instructions on the packaging. If it does not improve within 1-2 days, or other symptoms appear (fever, chills, or difficulty urinating) call the office to speak to a nurse.  You may return to normal daily activities such as work, school, driving, exercising and housework on the day of the procedure. If your doctor gave you a prescription, take it as ordered.   My office will contact you regarding your hydrodistention, cystoscopy with bladder biopsy.   Please call the office if you desire bladder instillations prior to your vacation in May.

## 2023-07-03 ENCOUNTER — Other Ambulatory Visit (HOSPITAL_COMMUNITY): Payer: Self-pay

## 2023-07-10 ENCOUNTER — Other Ambulatory Visit: Payer: Self-pay | Admitting: Family Medicine

## 2023-07-10 DIAGNOSIS — K219 Gastro-esophageal reflux disease without esophagitis: Secondary | ICD-10-CM

## 2023-07-10 DIAGNOSIS — F4323 Adjustment disorder with mixed anxiety and depressed mood: Secondary | ICD-10-CM

## 2023-07-10 DIAGNOSIS — G4700 Insomnia, unspecified: Secondary | ICD-10-CM

## 2023-07-20 ENCOUNTER — Other Ambulatory Visit (HOSPITAL_COMMUNITY): Payer: Self-pay

## 2023-07-20 ENCOUNTER — Other Ambulatory Visit: Payer: Self-pay | Admitting: Family Medicine

## 2023-07-20 ENCOUNTER — Encounter: Payer: Self-pay | Admitting: Family Medicine

## 2023-07-20 DIAGNOSIS — E66811 Obesity, class 1: Secondary | ICD-10-CM

## 2023-07-20 MED ORDER — WEGOVY 1.7 MG/0.75ML ~~LOC~~ SOAJ
1.7000 mg | SUBCUTANEOUS | 2 refills | Status: DC
  Filled 2023-07-20: qty 3, 28d supply, fill #0
  Filled 2023-08-22: qty 3, 28d supply, fill #1
  Filled 2023-09-25: qty 3, 28d supply, fill #2

## 2023-07-21 ENCOUNTER — Other Ambulatory Visit (HOSPITAL_COMMUNITY): Payer: Self-pay

## 2023-07-24 ENCOUNTER — Other Ambulatory Visit (HOSPITAL_COMMUNITY): Payer: Self-pay

## 2023-08-03 ENCOUNTER — Ambulatory Visit: Payer: Medicare HMO

## 2023-08-03 DIAGNOSIS — Z0000 Encounter for general adult medical examination without abnormal findings: Secondary | ICD-10-CM

## 2023-08-03 NOTE — Patient Instructions (Signed)
 Ms. Hyun , Thank you for taking time out of your busy schedule to complete your Annual Wellness Visit with me. I enjoyed our conversation and look forward to speaking with you again next year. I, as well as your care team,  appreciate your ongoing commitment to your health goals. Please review the following plan we discussed and let me know if I can assist you in the future. Your Game plan/ To Do List    Referrals: If you haven't heard from the office you've been referred to, please reach out to them at the phone provided.  N/a Follow up Visits: Next Medicare AWV with our clinical staff: 08/29/2024 at 10:50   Have you seen your provider in the last 6 months (3 months if uncontrolled diabetes)? Yes Next Office Visit with your provider: 12/06/2023 at 9:10  Clinician Recommendations:  Aim for 30 minutes of exercise or brisk walking, 6-8 glasses of water, and 5 servings of fruits and vegetables each day.       This is a list of the screening recommended for you and due dates:  Health Maintenance  Topic Date Due   Zoster (Shingles) Vaccine (1 of 2) Never done   COVID-19 Vaccine (3 - Pfizer risk series) 05/04/2020   Flu Shot  10/06/2023   Medicare Annual Wellness Visit  08/02/2024   DTaP/Tdap/Td vaccine (3 - Td or Tdap) 02/25/2025   Mammogram  05/18/2025   Colon Cancer Screening  11/22/2027   Pneumonia Vaccine  Completed   DEXA scan (bone density measurement)  Completed   Hepatitis C Screening  Completed   HIV Screening  Completed   HPV Vaccine  Aged Out   Meningitis B Vaccine  Aged Out    Advanced directives: (Copy Requested) Please bring a copy of your health care power of attorney and living will to the office to be added to your chart at your convenience. You can mail to [facility] [address] [place], [place] [address] or email to [email or web address] Advance Care Planning is important because it:  [x]  Makes sure you receive the medical care that is  consistent with your values, goals, and preferences  [x]  It provides guidance to your family and loved ones and reduces their decisional burden about whether or not they are making the right decisions based on your wishes.  Follow the link provided in your after visit summary or read over the paperwork we have mailed to you to help you started getting your Advance Directives in place. If you need assistance in completing these, please reach out to us  so that we can help you!  See attachments for Preventive Care and Fall Prevention Tips.

## 2023-08-03 NOTE — Progress Notes (Signed)
 Subjective:   Amy Hodge is a 68 y.o. who presents for a Medicare Wellness preventive visit.  As a reminder, Annual Wellness Visits don't include a physical exam, and some assessments may be limited, especially if this visit is performed virtually. We may recommend an in-person follow-up visit with your provider if needed.  Visit Complete: Virtual I connected with  [name] on 08/03/23 by a audio enabled telemedicine application and verified that I am speaking with the correct person using two identifiers.  Patient Location: Home  Provider Location: Home Office  I discussed the limitations of evaluation and management by telemedicine. The patient expressed understanding and agreed to proceed.  Vital Signs: Because this visit was a virtual/telehealth visit, some criteria may be missing or patient reported. Any vitals not documented were not able to be obtained and vitals that have been documented are patient reported.  VideoError- Librarian, academic were attempted between this provider and patient, however failed, due to patient having technical difficulties OR patient did not have access to video capability.  We continued and completed visit with audio only.   Persons Participating in Visit: Patient.  AWV Questionnaire: No: Patient Medicare AWV questionnaire was not completed prior to this visit.  Cardiac Risk Factors include: advanced age (>7men, >7 women);hypertension     Objective:     Today's Vitals   There is no height or weight on file to calculate BMI.     08/03/2023    8:52 AM 08/16/2022   11:46 AM 07/26/2022    9:41 AM 08/23/2019    2:18 PM 08/30/2016    8:18 AM 08/02/2016    8:48 AM 06/28/2016   10:53 AM  Advanced Directives  Does Patient Have a Medical Advance Directive? Yes Yes Yes No No No No  Type of Estate agent of [place];Living will Healthcare Power of [place];Living will Healthcare Power of  [place];Living will      Copy of Healthcare Power of Attorney in Chart? No - copy requested  No - copy requested      Would patient like information on creating a medical advance directive?    No - Patient declined No - Patient declined      Current Medications (verified) Outpatient Encounter Medications as of 08/03/2023  Medication Sig   acetaminophen  (TYLENOL ) 500 MG tablet Take 1,000 mg by mouth every 8 (eight) hours as needed (for pain or headaches).   albuterol  (VENTOLIN  HFA) 108 (90 Base) MCG/ACT inhaler INHALE 1 TO 2 PUFFS INTO THE LUNGS EVERY 6 HOURS AS NEEDED FOR WHEEZING OR SHORTNESS OF BREATH.   amitriptyline  (ELAVIL ) 50 MG tablet TAKE 1 TABLET AT BEDTIME   aspirin  EC 81 MG tablet Take 81 mg by mouth daily at 6 PM.    B Complex Vitamins (VITAMIN B COMPLEX PO) Take 1 tablet by mouth daily.   buPROPion  (WELLBUTRIN  XL) 150 MG 24 hr tablet TAKE 1 TABLET EVERY DAY   chlorpheniramine-HYDROcodone  (TUSSIONEX) 10-8 MG/5ML 5 ml every 12 hours if needed for cough   Cholecalciferol  (VITAMIN D3) 5000 units CAPS Take 5,000 Units by mouth daily.    cyclobenzaprine  (FLEXERIL ) 10 MG tablet TAKE 1 TABLET THREE TIMES DAILY AS NEEDED FOR MUSCLE SPASM(S)   DULoxetine  (CYMBALTA ) 20 MG capsule TAKE 2 CAPSULES EVERY DAY   fexofenadine  (ALLEGRA ) 180 MG tablet Take 1 tablet (180 mg total) by mouth daily.   hydrOXYzine  (ATARAX ) 25 MG tablet TAKE 1 TO 2 TABLETS BY MOUTH EVERY 6 TO  8 HOURS AS NEEDED FOR ANXIETY   levothyroxine  (SYNTHROID ) 175 MCG tablet TAKE 1 TABLET EVERY DAY   losartan  (COZAAR ) 50 MG tablet TAKE 1 TABLET EVERY DAY   LYSINE PO Take 2,000 mg by mouth daily.   Multiple Vitamin (MULTIVITAMIN) tablet Take 1 tablet by mouth daily.   nitroGLYCERIN  (NITROSTAT ) 0.4 MG SL tablet Place 0.4 mg under the tongue every 5 (five) minutes as needed for chest pain.   omeprazole  (PRILOSEC) 20 MG capsule TAKE 1 CAPSULE IN THE MORNING AND AT BEDTIME.   Semaglutide -Weight Management (WEGOVY ) 1.7 MG/0.75ML SOAJ  Inject 1.7 mg into the skin once a week.   spironolactone  (ALDACTONE ) 25 MG tablet Take 0.5 tablets (12.5 mg total) by mouth daily.   estradiol  (ESTRACE ) 0.1 MG/GM vaginal cream Place 1g nightly for two weeks then twice a week after (Patient not taking: Reported on 08/03/2023)   Vibegron  75 MG TABS Take 1 tablet (75 mg total) by mouth daily. (May take in the morning or at bedtime) (Patient not taking: Reported on 08/03/2023)   No facility-administered encounter medications on file as of 08/03/2023.    Allergies (verified) Bactrim [sulfamethoxazole-trimethoprim], Ketorolac, Anoro ellipta  [umeclidinium-vilanterol], Morphine, Sulfamethoxazole, Tetracyclines & related, Trimethoprim, Valsartan, Bystolic [nebivolol hcl], Isosorbide, Levofloxacin, Lisinopril, Metoprolol, Morphine and codeine, Simvastatin , Singulair  [montelukast  sodium], Statins, Tetracycline, and Toradol [ketorolac tromethamine]   History: Past Medical History:  Diagnosis Date   Allergy    Anxiety    COPD (chronic obstructive pulmonary disease) (HCC)    Coronary artery disease    a. s/p PCI of RCA in 2015 b. cath in 12/2015 showed 100% Ost RPDA with collaterals present   DDD (degenerative disc disease), cervical    Depression    DJD (degenerative joint disease) of knee    RT   GERD (gastroesophageal reflux disease)    Hashimoto's disease    Headache(784.0)    Hyperlipidemia    Hypertension    Hypothyroidism    Myocardial infarction (HCC)    Nocturnal hypoxemia 02/26/2015   On supplemental oxygen  therapy    Oxygen  2.5 l/m at bedtime.   Stented coronary artery 02/26/2015   Past Surgical History:  Procedure Laterality Date   ABDOMINAL HYSTERECTOMY     APPENDECTOMY  03/07/1969   BUNIONECTOMY Right    CARDIAC CATHETERIZATION N/A 09/02/2014   Procedure: Left Heart Cath and Coronary Angiography;  Surgeon: Knox Perl, MD;  Location: [facility] INVASIVE CV LAB;  Service: Cardiovascular;  Laterality: N/A;   CARDIAC CATHETERIZATION N/A  12/15/2015   Procedure: Left Heart Cath and Coronary Angiography;  Surgeon: Lucendia Rusk, MD;  Location: [facility] INVASIVE CV LAB;  Service: Cardiovascular;  Laterality: N/A;   CERVICAL FUSION     CESAREAN SECTION     COLONOSCOPY WITH PROPOFOL  N/A 02/24/2015   Procedure: COLONOSCOPY WITH PROPOFOL ;  Surgeon: Garrett Kallman, MD;  Location: [facility] ENDOSCOPY;  Service: Endoscopy;  Laterality: N/A;   ESOPHAGOGASTRODUODENOSCOPY (EGD) WITH PROPOFOL  N/A 02/24/2015   Procedure: ESOPHAGOGASTRODUODENOSCOPY (EGD) WITH PROPOFOL ;  Surgeon: Garrett Kallman, MD;  Location: [facility];  Service: Endoscopy;  Laterality: N/A;   HAND SURGERY     due to trauma-dog bite.   I & D EXTREMITY Bilateral 05/17/2013   Procedure: IRRIGATION AND DEBRIDEMENT EXTREMITY;  Surgeon: Ronn Cohn, MD;  Location: [facility] OR;  Service: Orthopedics;  Laterality: Bilateral;   LEFT HEART CATHETERIZATION WITH CORONARY ANGIOGRAM N/A 03/04/2014   Procedure: LEFT HEART CATHETERIZATION WITH CORONARY ANGIOGRAM;  Surgeon: Jessica Morn, MD;  Location: [facility] CATH LAB;  Service:  Cardiovascular;  Laterality: N/A;   OOPHORECTOMY     SHOULDER SURGERY     RT   THYROIDECTOMY  03/07/1994   TONSILLECTOMY  03/08/1963   TOTAL ABDOMINAL HYSTERECTOMY W/ BILATERAL SALPINGOOPHORECTOMY  01/06/2008   with cervical dysplasia   TUBAL LIGATION  03/07/1982   WRIST SURGERY Left 08/21/2022   Family History  Problem Relation Age of Onset   Lung cancer Mother    Hypertension Mother    Stroke Mother    Diabetes Mother    Diabetes Father    Hypertension Father    Heart attack Father    Asthma Father    COPD Father    Alcohol abuse Father    Breast cancer Sister 55   Diabetes Sister    Hypertension Sister    Uterine cancer Neg Hx    Bladder Cancer Neg Hx    Renal cancer Neg Hx    Social History   Socioeconomic History   Marital status: Widowed    Spouse name: Not on file   Number of children: 1   Years of education: Not on file   Highest  education level: Not on file  Occupational History   Occupation: disabled    Employer: [employer]  Tobacco Use   Smoking status: Former    Current packs/day: 0.00    Average packs/day: 2.0 packs/day for 25.0 years (50.0 ttl pk-yrs)    Types: Cigarettes    Start date: 04/24/1971    Quit date: 04/23/1996    Years since quitting: 27.2    Passive exposure: Never   Smokeless tobacco: Never  Vaping Use   Vaping status: Never Used  Substance and Sexual Activity   Alcohol use: No    Alcohol/week: 0.0 standard drinks of alcohol   Drug use: No   Sexual activity: Not Currently    Partners: Male    Birth control/protection: Post-menopausal  Other Topics Concern   Not on file  Social History Narrative   On worker's comp for lower back and right shoulder - applying for disability; losing [employer] insurance 03/07/2010   Social Drivers of Health   Financial Resource Strain: Low Risk  (08/03/2023)   Overall Financial Resource Strain (CARDIA)    Difficulty of Paying Living Expenses: Not hard at all  Food Insecurity: No Food Insecurity (08/03/2023)   Hunger Vital Sign    Worried About Running Out of Food in the Last Year: Never true    Ran Out of Food in the Last Year: Never true  Transportation Needs: No Transportation Needs (08/03/2023)   PRAPARE - Administrator, Civil Service (Medical): No    Lack of Transportation (Non-Medical): No  Physical Activity: Sufficiently Active (08/03/2023)   Exercise Vital Sign    Days of Exercise per Week: 5 days    Minutes of Exercise per Session: 30 min  Stress: No Stress Concern Present (08/03/2023)   [employer] of Occupational Health - Occupational Stress Questionnaire    Feeling of Stress : Only a little  Social Connections: Moderately Integrated (08/03/2023)   Social Connection and Isolation Panel [NHANES]    Frequency of Communication with Friends and Family: More than three times a week    Frequency of Social Gatherings with Friends and  Family: More than three times a week    Attends Religious Services: More than 4 times per year    Active Member of [employer] or Organizations: No    Attends Banker Meetings: Never    Marital Status:  Married    Tobacco Counseling Counseling given: Not Answered    Clinical Intake:  Pre-visit preparation completed: Yes  Pain : No/denies pain     Nutritional Risks: None Diabetes: No  Lab Results  Component Value Date   HGBA1C 5.3 03/17/2023   HGBA1C 5.5 12/15/2022   HGBA1C 5.7 (H) 05/19/2022     How often do you need to have someone help you when you read instructions, pamphlets, or other written materials from your doctor or pharmacy?: 1 - Never  Interpreter Needed?: No  Information entered by :: NAllen LPN   Activities of Daily Living     08/03/2023    8:46 AM  In your present state of health, do you have any difficulty performing the following activities:  Hearing? 0  Vision? 0  Difficulty concentrating or making decisions? 0  Walking or climbing stairs? 0  Dressing or bathing? 0  Doing errands, shopping? 0  Preparing Food and eating ? N  Using the Toilet? N  In the past six months, have you accidently leaked urine? Y  Comment has bladder issues  Do you have problems with loss of bowel control? N  Managing your Medications? N  Managing your Finances? N  Housekeeping or managing your Housekeeping? N    Patient Care Team: Laneta Pintos, MD as PCP - General (Family Medicine) Audery Blazing Deannie Fabian, MD as PCP - Cardiology (Cardiology) Audery Blazing Deannie Fabian, MD as Consulting Physician (Cardiology) Faustina Hood, MD as Consulting Physician (Pulmonary Disease) Ronn Cohn, MD as Consulting Physician (Orthopedic Surgery) Garrett Kallman, MD as Consulting Physician (Gastroenterology)  Indicate any recent Medical Services you may have received from other than [facility] providers in the past year (date may be approximate).     Assessment:    This is a  routine wellness examination for She[place].  Hearing/Vision screen Hearing Screening - Comments:: Denies hearing issues Vision Screening - Comments:: Regular eye exams, [facility]   Goals Addressed             This Visit's Progress    Patient Stated       08/03/2023, denies goals       Depression Screen     08/03/2023    8:54 AM 03/24/2023    8:29 AM 07/26/2022    9:38 AM 07/05/2022    1:21 PM 05/23/2022    8:55 AM 01/21/2022    8:34 AM 07/21/2021    8:42 AM  PHQ 2/9 Scores  PHQ - 2 Score 0 0 0 0 0 0 0  PHQ- 9 Score 0 0 0 0  0 0    Fall Risk     08/03/2023    8:53 AM 07/26/2022    9:40 AM 07/25/2022   12:24 PM 05/23/2022    8:55 AM 01/21/2022    8:34 AM  Fall Risk   Falls in the past year? 1 0 0 0 0  Comment tripped in garage      Number falls in past yr: 0 0  0 0  Injury with Fall? 1 0 0 0 0  Comment broke wrist      Risk for fall due to : Medication side effect No Fall Risks  History of fall(s)   Follow up Falls prevention discussed;Falls evaluation completed Falls prevention discussed       MEDICARE RISK AT HOME:  Medicare Risk at Home Any stairs in or around the home?: Yes If so, are there any without handrails?: No Home  free of loose throw rugs in walkways, pet beds, electrical cords, etc?: Yes Adequate lighting in your home to reduce risk of falls?: Yes Life alert?: No Use of a cane, walker or w/c?: No Grab bars in the bathroom?: No Shower chair or bench in shower?: Yes Elevated toilet seat or a handicapped toilet?: Yes  TIMED UP AND GO:  Was the test performed?  No  Cognitive Function: 6CIT completed        08/03/2023    8:55 AM 07/26/2022    9:41 AM 07/21/2021    8:43 AM 01/25/2019    8:08 AM  6CIT Screen  What Year? 0 points 0 points 0 points 0 points  What month? 0 points 0 points 0 points 0 points  What time? 0 points 0 points 0 points 0 points  Count back from 20 0 points 0 points 0 points 0 points  Months in reverse 0 points 0 points 0  points 0 points  Repeat phrase 0 points 0 points 0 points 0 points  Total Score 0 points 0 points 0 points 0 points    Immunizations Immunization History  Administered Date(s) Administered   Fluad Trivalent(High Dose 65+) 12/22/2022   Influenza Split 12/31/2010   Influenza Whole 11/27/2008, 02/16/2010   Influenza, Mdck, Trivalent,PF 6+ MOS(egg free) 11/21/2014   Influenza,inj,Quad PF,6+ Mos 12/30/2016, 12/26/2017, 12/21/2018, 01/21/2020, 12/07/2020, 12/10/2021   Influenza-Unspecified 05/15/2013, 12/06/2014, 11/05/2015, 12/26/2017   PFIZER(Purple Top)SARS-COV-2 Vaccination 03/16/2020, 04/06/2020   PNEUMOCOCCAL CONJUGATE-20 03/24/2023   Pneumococcal Conjugate-13 02/15/2013   Pneumococcal Polysaccharide-23 03/07/2012, 01/01/2018   Td 03/07/2004   Tdap 02/26/2015    Screening Tests Health Maintenance  Topic Date Due   Zoster Vaccines- Shingrix  (1 of 2) Never done   COVID-19 Vaccine (3 - Pfizer risk series) 05/04/2020   INFLUENZA VACCINE  10/06/2023   Medicare Annual Wellness (AWV)  08/02/2024   DTaP/Tdap/Td (3 - Td or Tdap) 02/25/2025   MAMMOGRAM  05/18/2025   Colonoscopy  11/22/2027   Pneumonia Vaccine 35+ Years old  Completed   DEXA SCAN  Completed   Hepatitis C Screening  Completed   HIV Screening  Completed   HPV VACCINES  Aged Out   Meningococcal B Vaccine  Aged Out    Health Maintenance  Health Maintenance Due  Topic Date Due   Zoster Vaccines- Shingrix  (1 of 2) Never done   COVID-19 Vaccine (3 - Pfizer risk series) 05/04/2020   Health Maintenance Items Addressed: Declines covid and shingles vaccine  Additional Screening:  Vision Screening: Recommended annual ophthalmology exams for early detection of glaucoma and other disorders of the eye.  Dental Screening: Recommended annual dental exams for proper oral hygiene  Community Resource Referral / Chronic Care Management: CRR required this visit?  No   CCM required this visit?  No   Plan:    I have  personally reviewed and noted the following in the patient's chart:   Medical and social history Use of alcohol, tobacco or illicit drugs  Current medications and supplements including opioid prescriptions. Patient is not currently taking opioid prescriptions. Functional ability and status Nutritional status Physical activity Advanced directives List of other physicians Hospitalizations, surgeries, and ER visits in previous 12 months Vitals Screenings to include cognitive, depression, and falls Referrals and appointments  In addition, I have reviewed and discussed with patient certain preventive protocols, quality metrics, and best practice recommendations. A written personalized care plan for preventive services as well as general preventive health recommendations were provided to patient.  Areatha Beecham, LPN   06/13/8117   After Visit Summary: (MyChart) Due to this being a telephonic visit, the after visit summary with patients personalized plan was offered to patient via MyChart   Notes: Nothing significant to report at this time.

## 2023-08-17 ENCOUNTER — Ambulatory Visit: Payer: Self-pay

## 2023-08-17 NOTE — Telephone Encounter (Addendum)
 FYI Only or Action Required?: Action required by provider  Patient was last seen in primary care on 06/06/2023 by Laneta Pintos, MD. Called Nurse Triage reporting Mass and Shortness of Breath. Symptoms began a week ago. Interventions attempted: Rest, hydration, or home remedies. Symptoms are: gradually worsening.  Triage Disposition: Go to ED Now (Notify PCP)  Patient/caregiver understands and will follow disposition?: No, refuses disposition            Copied from CRM [id number]. Topic: Clinical - Medical Advice >> Aug 17, 2023  2:17 PM Adaline Holly wrote: Patient states that she noticed a lump above her knee on her right leg last week that has started getting bigger over the last several days. Patient denies any pain or bruising. Next available appointment is August. Reason for Disposition  [1] MODERATE difficulty breathing (e.g., speaks in phrases, SOB even at rest, pulse 100-120) AND [2] NEW-onset or WORSE than normal  Answer Assessment - Initial Assessment Questions 1. APPEARANCE of SWELLING: What does it look like?     Softball-sized 2. SIZE: How large is the swelling? (e.g., inches, cm; or compare to size of pinhead, tip of pen, eraser, coin, pea, grape, ping pong ball)      Close to a softball-size  3. LOCATION: Where is the swelling located?     Above her knee on the back side near where her knee bends 4. ONSET: When did the swelling start?     1 wk 5. COLOR: What color is it? Is there more than one color?     None  6. PAIN: Is there any pain? If Yes, ask: How bad is the pain? (e.g., scale 1-10; or mild, moderate, severe)     - NONE (0): no pain   - MILD (1-3): doesn't interfere with normal activities    - MODERATE (4-7): interferes with normal activities or awakens from sleep    - SEVERE (8-10): excruciating pain, unable to do any normal activities     Painful if she pushes on it, otherwise no  7. ITCH: Does it itch? If Yes, ask: How bad is the itch?       No  8. CAUSE: What do you think caused the swelling? 9 OTHER SYMPTOMS: Do you have any other symptoms? (e.g., fever)      Denies bruising, denies redness, or denies discoloration  Denies CP. Pt states she has a hx of COPD and gets short-winded real fast and then it goes away, pt states this SOB may be worse. Pt endorses some SOB with sitting   Denies fever or chills  L ring finger swelled up for no reason, denies pain to her finger  Answer Assessment - Initial Assessment Questions Pt called about a golfball-sized lump to the back of her R knee. Pt also reports some weakness, intermittent dizziness, and intermittent worsening SOB (hx of COPD). Pt declined ED.  1. RESPIRATORY STATUS: Describe your breathing? (e.g., wheezing, shortness of breath, unable to speak, severe coughing)      Some SOB at rest -- worsening SOB with COPD  2. ONSET: When did this breathing problem begin?      Within the past wk  3. PATTERN Does the difficult breathing come and go, or has it been constant since it started?      Come and goes, intermittent episodes  4. SEVERITY: How bad is your breathing? (e.g., mild, moderate, severe)    - MILD: No SOB at rest, mild SOB with walking, speaks normally  in sentences, can lie down, no retractions, pulse < 100.    - MODERATE: SOB at rest, SOB with minimal exertion and prefers to sit, cannot lie down flat, speaks in phrases, mild retractions, audible wheezing, pulse 100-120.    - SEVERE: Very SOB at rest, speaks in single words, struggling to breathe, sitting hunched forward, retractions, pulse > 120      Moderate  5. RECURRENT SYMPTOM: Have you had difficulty breathing before? If Yes, ask: When was the last time? and What happened that time?      Yes - COPD 6. CARDIAC HISTORY: Do you have any history of heart disease? (e.g., heart attack, angina, bypass surgery, angioplasty)      HTN, aortic aneurysm  7. LUNG HISTORY: Do you have any history  of lung disease?  (e.g., pulmonary embolus, asthma, emphysema)     COPD 8. CAUSE: What do you think is causing the breathing problem?      Not sure  9. OTHER SYMPTOMS: Do you have any other symptoms? (e.g., dizziness, runny nose, cough, chest pain, fever)      Endorses some weakness, dizziness that comes and goes   Denies N/V, denies CP   Denies cough, fever, chills  Endorses some allergies   Mass/lump to the back of her leg above her knee   10. O2 SATURATION MONITOR:  Do you use an oxygen  saturation monitor (pulse oximeter) at home? If Yes, ask: What is your reading (oxygen  level) today? What is your usual oxygen  saturation reading? (e.g., 95%)       It has been down as low as 91 but it's not been high since I had COVID  Protocols used: Skin Lump or Localized Swelling-A-AH, Breathing Difficulty-A-AH

## 2023-08-18 ENCOUNTER — Encounter (HOSPITAL_COMMUNITY): Payer: Self-pay

## 2023-08-18 ENCOUNTER — Other Ambulatory Visit: Payer: Self-pay

## 2023-08-18 ENCOUNTER — Encounter: Payer: Self-pay | Admitting: Cardiology

## 2023-08-18 ENCOUNTER — Emergency Department (HOSPITAL_COMMUNITY)
Admission: EM | Admit: 2023-08-18 | Discharge: 2023-08-18 | Disposition: A | Discharge status: Home / Self Care | Attending: Emergency Medicine | Admitting: Emergency Medicine

## 2023-08-18 ENCOUNTER — Emergency Department (HOSPITAL_COMMUNITY)

## 2023-08-18 DIAGNOSIS — R600 Localized edema: Secondary | ICD-10-CM | POA: Diagnosis not present

## 2023-08-18 DIAGNOSIS — J449 Chronic obstructive pulmonary disease, unspecified: Secondary | ICD-10-CM | POA: Insufficient documentation

## 2023-08-18 DIAGNOSIS — F17200 Nicotine dependence, unspecified, uncomplicated: Secondary | ICD-10-CM | POA: Insufficient documentation

## 2023-08-18 DIAGNOSIS — I2510 Atherosclerotic heart disease of native coronary artery without angina pectoris: Secondary | ICD-10-CM | POA: Diagnosis not present

## 2023-08-18 DIAGNOSIS — R0602 Shortness of breath: Secondary | ICD-10-CM | POA: Diagnosis not present

## 2023-08-18 DIAGNOSIS — Z7982 Long term (current) use of aspirin: Secondary | ICD-10-CM | POA: Diagnosis not present

## 2023-08-18 DIAGNOSIS — R918 Other nonspecific abnormal finding of lung field: Secondary | ICD-10-CM | POA: Diagnosis not present

## 2023-08-18 DIAGNOSIS — R0789 Other chest pain: Secondary | ICD-10-CM | POA: Diagnosis not present

## 2023-08-18 DIAGNOSIS — R7989 Other specified abnormal findings of blood chemistry: Secondary | ICD-10-CM | POA: Diagnosis not present

## 2023-08-18 LAB — CBC
HCT: 42.5 % (ref 36.0–46.0)
Hemoglobin: 13.6 g/dL (ref 12.0–15.0)
MCH: 28.9 pg (ref 26.0–34.0)
MCHC: 32 g/dL (ref 30.0–36.0)
MCV: 90.2 fL (ref 80.0–100.0)
Platelets: 288 10*3/uL (ref 150–400)
RBC: 4.71 MIL/uL (ref 3.87–5.11)
RDW: 12.3 % (ref 11.5–15.5)
WBC: 7.7 10*3/uL (ref 4.0–10.5)
nRBC: 0 % (ref 0.0–0.2)

## 2023-08-18 LAB — BASIC METABOLIC PANEL WITH GFR
Anion gap: 10 (ref 5–15)
BUN: 11 mg/dL (ref 8–23)
CO2: 21 mmol/L — ABNORMAL LOW (ref 22–32)
Calcium: 9 mg/dL (ref 8.9–10.3)
Chloride: 105 mmol/L (ref 98–111)
Creatinine, Ser: 0.79 mg/dL (ref 0.44–1.00)
GFR, Estimated: >60 mL/min (ref >60)
Glucose, Bld: 92 mg/dL (ref 70–99)
Potassium: 4.3 mmol/L (ref 3.5–5.1)
Sodium: 136 mmol/L (ref 135–145)

## 2023-08-18 LAB — TROPONIN I (HIGH SENSITIVITY): Troponin I (High Sensitivity): 4 ng/L (ref <18)

## 2023-08-18 MED ORDER — IOHEXOL 350 MG/ML SOLN
75.0000 mL | Freq: Once | INTRAVENOUS | Status: AC | PRN
  Administered 2023-08-18: 75 mL via INTRAVENOUS

## 2023-08-18 NOTE — Telephone Encounter (Signed)
Multiple encounters opened by pt. Closing this encounter.

## 2023-08-18 NOTE — ED Triage Notes (Signed)
 Patient reports lump on back of leg that has been getting bigger over time, no tenderness to palpation and soft. Patient also reports shortness of breath and chest pain that radiates to her shoulder blades that starts while sitting, hx of COPD and CAD. Pain decreases with rescue inhaler use or rest.

## 2023-08-18 NOTE — Telephone Encounter (Signed)
 Patient has arrived in the ER per chart.

## 2023-08-18 NOTE — ED Provider Notes (Signed)
 [place] EMERGENCY DEPARTMENT AT [facility] Provider Note   CSN: [id number] Arrival date & time: 08/18/23  1050     Patient presents with: Shortness of Breath and Chest Pain   Amy Hodge is a 68 y.o. female with a history of COPD, MI, CAD, and aortic aneurysm who presents to the ED today for chest pain and shortness of breath.  Patient reports intermittent episodes of chest pain that radiates to her back, shortness of breath, numbness that occur with both movement and at rest.  States that these episodes only last for about a minute or so and then go away on their own.  States that this shortness of breath is new compared to her usual shortness for that she has with her COPD.  Also endorses left lower extremity swelling as well as swelling to her left ring finger.  She also says that she feels a lump on her right lateral leg.  She denies any pain with it, redness, or warmth to touch. No fevers.  No recent hospitalizations, surgeries, or long travels.    Prior to Admission medications   Medication Sig Start Date End Date Taking? Authorizing Provider  acetaminophen  (TYLENOL ) 500 MG tablet Take 1,000 mg by mouth every 8 (eight) hours as needed (for pain or headaches).    [provider]  albuterol  (VENTOLIN  HFA) 108 (90 Base) MCG/ACT inhaler INHALE 1 TO 2 PUFFS INTO THE LUNGS EVERY 6 HOURS AS NEEDED FOR WHEEZING OR SHORTNESS OF BREATH. 04/24/23   Laneta Pintos, MD  amitriptyline  (ELAVIL ) 50 MG tablet TAKE 1 TABLET AT BEDTIME 07/10/23   Laneta Pintos, MD  aspirin  EC 81 MG tablet Take 81 mg by mouth daily at 6 PM.     [provider]  B Complex Vitamins (VITAMIN B COMPLEX PO) Take 1 tablet by mouth daily.    [provider]  buPROPion  (WELLBUTRIN  XL) 150 MG 24 hr tablet TAKE 1 TABLET EVERY DAY 07/10/23   Laneta Pintos, MD  chlorpheniramine-HYDROcodone  (TUSSIONEX) 10-8 MG/5ML 5 ml every 12 hours if needed for cough 04/21/23   Rosa College D, MD   Cholecalciferol  (VITAMIN D3) 5000 units CAPS Take 5,000 Units by mouth daily.     [provider]  cyclobenzaprine  (FLEXERIL ) 10 MG tablet TAKE 1 TABLET THREE TIMES DAILY AS NEEDED FOR MUSCLE SPASM(S) 06/06/23   Laneta Pintos, MD  DULoxetine  (CYMBALTA ) 20 MG capsule TAKE 2 CAPSULES EVERY DAY 12/27/21   Abonza, Maritza, PA-C  estradiol  (ESTRACE ) 0.1 MG/GM vaginal cream Place 1g nightly for two weeks then twice a week after Patient not taking: Reported on 08/03/2023 06/15/23   Darlene Ehlers, MD  fexofenadine  (ALLEGRA ) 180 MG tablet Take 1 tablet (180 mg total) by mouth daily. 11/13/17   Opalski, Bernardo Bridgeman, DO  hydrOXYzine  (ATARAX ) 25 MG tablet TAKE 1 TO 2 TABLETS BY MOUTH EVERY 6 TO 8 HOURS AS NEEDED FOR ANXIETY 12/22/22   Noreene Bearded, PA  levothyroxine  (SYNTHROID ) 175 MCG tablet TAKE 1 TABLET EVERY DAY 03/30/23   Noreene Bearded, PA  losartan  (COZAAR ) 50 MG tablet TAKE 1 TABLET EVERY DAY 02/10/23   Lenise Quince, MD  LYSINE PO Take 2,000 mg by mouth daily.    [provider]  Multiple Vitamin (MULTIVITAMIN) tablet Take 1 tablet by mouth daily.    [provider]  nitroGLYCERIN  (NITROSTAT ) 0.4 MG SL tablet Place 0.4 mg under the tongue every 5 (five) minutes as needed for chest pain.  [provider]  omeprazole  (PRILOSEC) 20 MG capsule TAKE 1 CAPSULE IN THE MORNING AND AT BEDTIME. 07/10/23   Laneta Pintos, MD  Semaglutide -Weight Management (WEGOVY ) 1.7 MG/0.75ML SOAJ Inject 1.7 mg into the skin once a week. 07/20/23   Laneta Pintos, MD  spironolactone  (ALDACTONE ) 25 MG tablet Take 0.5 tablets (12.5 mg total) by mouth daily. 03/24/23   Noreene Bearded, PA  Vibegron  75 MG TABS Take 1 tablet (75 mg total) by mouth daily. (May take in the morning or at bedtime) Patient not taking: Reported on 08/03/2023 03/28/23   Maryclare Smoke A, PA    Allergies: Bactrim [sulfamethoxazole-trimethoprim], Ketorolac, Anoro ellipta  [umeclidinium-vilanterol], Morphine,  Sulfamethoxazole, Tetracyclines & related, Trimethoprim, Valsartan, Bystolic [nebivolol hcl], Isosorbide, Levofloxacin, Lisinopril, Metoprolol, Morphine and codeine, Simvastatin , Singulair  [montelukast  sodium], Statins, Tetracycline, and Toradol [ketorolac tromethamine]    Review of Systems  Respiratory:  Positive for shortness of breath.   Cardiovascular:  Positive for chest pain.  All other systems reviewed and are negative.   Updated Vital Signs BP (!) 121/93 (BP Location: Right Arm)   Pulse 89   Temp 98.6 F (37 C)   Resp 16   Ht 5' 5 (1.651 m)   Wt 81.6 kg   SpO2 91%   BMI 29.95 kg/m   Physical Exam Vitals and nursing note reviewed.  Constitutional:      General: She is not in acute distress.    Appearance: Normal appearance.  HENT:     Head: Normocephalic and atraumatic.     Mouth/Throat:     Mouth: Mucous membranes are moist.   Eyes:     Conjunctiva/sclera: Conjunctivae normal.     Pupils: Pupils are equal, round, and reactive to light.    Cardiovascular:     Rate and Rhythm: Normal rate and regular rhythm.     Pulses: Normal pulses.     Heart sounds: Normal heart sounds.  Pulmonary:     Effort: Pulmonary effort is normal.     Breath sounds: Normal breath sounds.  Abdominal:     Palpations: Abdomen is soft.     Tenderness: There is no abdominal tenderness.   Musculoskeletal:        General: No tenderness. Normal range of motion.     Cervical back: Normal range of motion.     Right lower leg: No edema.     Left lower leg: Edema present.     Comments: Some swelling of left lower extremity compared to right without tenderness to palpation.  Some swelling of the ring finger of the left hand without tenderness to touch or impaired ROM.  Area of induration to right lateral thigh without erythema, warmth to touch, or tenderness   Skin:    General: Skin is warm and dry.     Findings: No rash.   Neurological:     General: No focal deficit present.      Mental Status: She is alert.     Sensory: No sensory deficit.     Motor: No weakness.   Psychiatric:        Mood and Affect: Mood normal.        Behavior: Behavior normal.    (all labs ordered are listed, but only abnormal results are displayed) Labs Reviewed  BASIC METABOLIC PANEL WITH GFR - Abnormal; Notable for the following components:      Result Value   CO2 21 (*)    All other components within normal limits  CBC  TROPONIN  I (HIGH SENSITIVITY)    EKG: None  Radiology: No results found.   Procedures   Medications Ordered in the ED - No data to display                                  Medical Decision Making Amount and/or Complexity of Data Reviewed Labs: ordered. Radiology: ordered.  Risk Prescription drug management.   This patient presents to the ED for concern of chest pain and shortness of breath, this involves an extensive number of treatment options, and is a complaint that carries with it a high risk of complications and morbidity.   Differential diagnosis includes: ACS, PE, COPD exacerbation, pneumonia, etc.   Comorbidities  See HPI above   Additional History  Additional history obtained from prior records   Cardiac Monitoring / EKG  The patient was maintained on a cardiac monitor.  I personally viewed and interpreted the cardiac monitored which showed: NSR with a heart rate of 82 bpm.   Lab Tests  I ordered and personally interpreted labs.  The pertinent results include:   Negative troponin BMP and CBC are reassuring   Imaging Studies  I ordered imaging studies including CXR and CTA PE study  I independently visualized and interpreted imaging which showed:  CXR shows left mid and bilateral lower lung linear opacities, likely atelectasis/scarring. CTA PE study shows no acute PE. No acute pulmonary findings. I agree with the radiologist interpretation   Problem List / ED Course / Critical Interventions / Medication  Management  Patient reports intermittent episodes of chest pain that radiates to her back, shortness of breath, and dizziness that occur at random.  States that this occurs sometimes while she is at rest and other times when she is moving.  Is also concerned because she has an area of induration of her right lateral thigh. There is no redness, warmth to touch, or tenderness to touch of the right side of her leg.  I have low suspicion for DVT due to the location and lack of swelling, warmth to touch, or redness to the lateral thigh.  States that this has been going on for a while now but the area is just getting a bit larger.  No swelling of the right thigh or leg.  She is neurovascularly intact.  No recent long travels, hospitalizations, or surgeries.  Was advised to come to the ED from her primary care due to her dizziness and shortness of breath. Imaging is negative for signs of blood clot. Discussed findings with patient.  All questions answered.   Social Determinants of Health  History of tobacco use   Test / Admission - Considered  She is stable and safe for discharge home. Return precautions provided.      Final diagnoses:  None    ED Discharge Orders     None          Sonnie Dusky, PA-C 08/18/23 2022    Mozell Arias, MD 08/20/23 [phone]

## 2023-08-18 NOTE — Telephone Encounter (Signed)
 Can you see if the patient can be seen sometime next week? I would agree with the triage nurse recommendation that if she was having leg swelling and shortness of breath she should be seen in the ED

## 2023-08-18 NOTE — Telephone Encounter (Signed)
 Pt is headed to [facility] on [address] today.

## 2023-08-18 NOTE — Discharge Instructions (Signed)
 Your labs and imaging are reassuring. No signs of blood clot in the lungs or injury to heart muscle.  Follow up with your cardiologist in the next week for reevaluation.  Get help right away if: Your chest pain is worse. You have a cough that gets worse, or you cough up blood. You have very bad (severe) pain in your belly (abdomen). You pass out (faint). You have either of these for no clear reason: Sudden chest discomfort. Sudden discomfort in your arms, back, neck, or jaw. You have shortness of breath at any time. You suddenly start to sweat, or your skin gets clammy. You feel sick to your stomach (nauseous). You throw up (vomit). You suddenly feel lightheaded or dizzy. You feel very weak or tired. Your heart starts to beat fast, or it feels like it is skipping beats.

## 2023-08-22 NOTE — Progress Notes (Deleted)
   Cardiology Office Note    Date:  08/22/2023  ID:  Amy Hodge, DOB July 04, 1955, MRN [id number] PCP:  Amy Pintos, MD  Cardiologist:  Amy Angel, MD  Electrophysiologist:  None   Chief Complaint: ***  History of Present Illness: .    [name] is a 68 y.o. female with visit-pertinent history of CAD with chronic chest pain, anxiety, COPD, DDD, depression, GERD, Hashimoto's disease, hypothyroidism, HTN, HLD, multiple medication intolerances, nocturnal hypoxemia on supplemental nocturnal O2, lung nodule, mildly dilated ascending aorta seen for follow-up. Per notes, she has history of NSTEMI 05/2013 with complicated PCI of RCA (performed in [facility]) Cath 08/2014 showed 100 PDA, patent stents in proximal and mid RCA, no obstructive disease in the left system; collaterals to the PDA noted; attempt at PCI of CTO unsuccessful. Last cath 10/17 showed 100 PDA (filled with left to right collaterals), patent RCA stent and normal LV function.  Dr [place] reviewed and did not think attempt at CTO intervention or CABG was appropriate so this was managed medically.  PET scan November 2023 showed mild ischemia in the apical to mid inferior wall and ejection fraction 51%. The abnormality was felt consistent with the occluded PDA and she was continued on medical therapy. CTA August 2024 showed 4 cm ectasia of the ascending aorta, emphysema and stable 5 mm right middle lobe nodule. Dr. Audery Hodge plans repeat study August 2025. Other studies include: Echo 05/2021 EF 55%, mild LVH, trivial MR, mild AI, mildly dilated ascending aorta. ABI 02/2014 normal. Neck CTA June 2021 showed no significant carotid stenosis. H/o cough with ACEI, intolerant of statins/Zetia , intolerant of Repatha  due to cough and has not wanted to pursue other lipid lowering measures.  She was seen in the ED 08/18/23 due to random shortness of breath, dizziness, knot/lump in her thigh. Troponin was negative, CTA reassuring - lung nodule was also  stable, felt benign.   Shortness of breath, dizziness, CAD with chronic chest pain Lung nodule and mildly dilated ascending aorta ** can we see on CT??? Otherwise cancel 10/2023 Essential HTN HLD Mild AI  Labwork independently reviewed: 08/2023 K 4.2, Cr 0.79, CBC OK 03/2023 LDL 115, trig 159 by PCP, TSH OK  ROS: .    Please see the history of present illness. Otherwise, review of systems is positive for ***.  All other systems are reviewed and otherwise negative.  Studies Reviewed: Aaron Aas    EKG:  EKG is ordered today, personally reviewed, demonstrating ***  CV Studies: Cardiac studies reviewed are outlined and summarized above. Otherwise please see EMR for full report.   Current Reported Medications:.    No outpatient medications have been marked as taking for the 08/23/23 encounter (Appointment) with Amy Hodge N, PA-C.    Physical Exam:    VS:  There were no vitals taken for this visit.   Wt Readings from Last 3 Encounters:  08/18/23 180 lb (81.6 kg)  06/14/23 182 lb 4.8 oz (82.7 kg)  06/06/23 181 lb 1.9 oz (82.2 kg)    GEN: Well nourished, well developed in no acute distress NECK: No JVD; No carotid bruits CARDIAC: ***RRR, no murmurs, rubs, gallops RESPIRATORY:  Clear to auscultation without rales, wheezing or rhonchi  ABDOMEN: Soft, non-tender, non-distended EXTREMITIES:  No edema; No acute deformity   Asessement and Plan:.     ***     Disposition: F/u with ***  Signed, Myrel Rappleye Hodge Aldo Sondgeroth, PA-C

## 2023-08-23 ENCOUNTER — Ambulatory Visit: Admitting: Physician Assistant

## 2023-08-24 DIAGNOSIS — H2513 Age-related nuclear cataract, bilateral: Secondary | ICD-10-CM | POA: Diagnosis not present

## 2023-08-28 NOTE — Progress Notes (Unsigned)
 Cardiology Office Note    Date:  08/30/2023  ID:  Amy Hodge, DOB October 30, 1955, MRN [id number] PCP:  Chandra Toribio POUR, MD  Cardiologist:  Redell Shallow, MD  Electrophysiologist:  None   Chief Complaint: f/u ER visit for various symptoms  History of Present Illness: .    Amy Hodge is a 68 y.o. female with visit-pertinent history of CAD with chronic chest pain, anxiety, COPD, DDD, depression, GERD, Hashimoto's disease, hypothyroidism, HTN, HLD, multiple medication intolerances, nocturnal hypoxemia on supplemental nocturnal O2, lung nodule, mildly dilated ascending aorta seen for follow-up. Per notes, she has history of NSTEMI 05/2013 with complicated PCI of RCA (performed in [facility]). Cath 08/2014 showed 100 PDA, patent stents in proximal and mid RCA, no obstructive disease in the left system; collaterals to the PDA noted; attempt at PCI of CTO unsuccessful. Last cath 10/17 showed 100 PDA (filled with left to right collaterals), patent RCA stent and normal LV function.  Dr [place] reviewed and did not think attempt at CTO intervention or CABG was appropriate so this was managed medically.  PET scan November 2023 showed mild ischemia in the apical to mid inferior wall and ejection fraction 51%. The abnormality was felt consistent with the occluded PDA and she was continued on medical therapy. CTA August 2024 showed 4 cm ectasia of the ascending aorta, emphysema and stable 5 mm right middle lobe nodule, repeat study planned August 2025. Other studies include echo 05/2021 EF 55%, mild LVH, trivial MR, mild AI, mildly dilated ascending aorta. ABI 02/2014 normal. Neck CTA June 2021 showed no significant carotid stenosis. She has h/o cough with ACEI, intolerant of statins/Zetia , intolerant of Repatha  due to cough and has not wanted to pursue other lipid lowering measures. She has multiple medication intolerances including Bystolic, metoprolol, Imdur.  She was seen in the ED 08/18/23 due to sporadic episodes  of chest tightness, random shortness of breath, dizziness, and knot/lump in her thigh. ED was not concerned with induration on leg. Troponin was negative, CTA reassuring - lung nodule was also stable, felt benign. ED was not concerned with She was discharged home with outpatient follow-up. She returns for follow-up today feeling about the same. She confirms she has had chronic chest pain over the years. This felt a little different. She describes it as short bursts of feeling some tightness between her shoulder blades going to her chest as a twinge, sometimes tightness, awareness of her left arm, random shortness of breath, sometimes associated with palpitations and dizziness at that time. These last maximum 5 minutes then can go several hours before an episode recurs. This is non-exertional and occur at random without specific trigger, resolve without intervention. This is happening 4 out of 7 days a week, sometimes several times a day. 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 However, she does note she is getting more SOB with general activities like walking quickly up the driveway. She also notes general fatigue.  She reports that her spironolactone  previously had to be reduced due to decreased BP after losing weight on GLP1.  Labwork independently reviewed: 08/2023 K 4.2, Cr 0.79, CBC OK 03/2023 LDL 115, trig 159 by PCP, TSH OK  ROS: .    Please see the history of present illness. All other systems are reviewed and otherwise negative.  Studies Reviewed: SABRA    EKG:  EKG is not ordered today but reviewed from 08/18/23 ED visit - NSR 82bpm,  nonspecific STTW changes  CV Studies: Cardiac studies reviewed are outlined and summarized above. Otherwise please see EMR for full report.   Current Reported Medications:.    Current Meds  Medication Sig   acetaminophen  (TYLENOL ) 500 MG tablet Take 1,000 mg by mouth every 8 (eight) hours as needed (for pain or  headaches).   albuterol  (VENTOLIN  HFA) 108 (90 Base) MCG/ACT inhaler INHALE 1 TO 2 PUFFS INTO THE LUNGS EVERY 6 HOURS AS NEEDED FOR WHEEZING OR SHORTNESS OF BREATH.   amitriptyline  (ELAVIL ) 50 MG tablet TAKE 1 TABLET AT BEDTIME   aspirin  EC 81 MG tablet Take 81 mg by mouth daily at 6 PM.    B Complex Vitamins (VITAMIN B COMPLEX PO) Take 1 tablet by mouth daily.   buPROPion  (WELLBUTRIN  XL) 150 MG 24 hr tablet TAKE 1 TABLET EVERY DAY   chlorpheniramine-HYDROcodone  (TUSSIONEX) 10-8 MG/5ML 5 ml every 12 hours if needed for cough   Cholecalciferol  (VITAMIN D3) 5000 units CAPS Take 5,000 Units by mouth daily.    cyclobenzaprine  (FLEXERIL ) 10 MG tablet TAKE 1 TABLET THREE TIMES DAILY AS NEEDED FOR MUSCLE SPASM(S)   DULoxetine  (CYMBALTA ) 20 MG capsule TAKE 2 CAPSULES EVERY DAY   fexofenadine  (ALLEGRA ) 180 MG tablet Take 1 tablet (180 mg total) by mouth daily.   hydrOXYzine  (ATARAX ) 25 MG tablet TAKE 1 TO 2 TABLETS BY MOUTH EVERY 6 TO 8 HOURS AS NEEDED FOR ANXIETY   levothyroxine  (SYNTHROID ) 175 MCG tablet TAKE 1 TABLET EVERY DAY   losartan  (COZAAR ) 50 MG tablet TAKE 1 TABLET EVERY DAY   LYSINE PO Take 2,000 mg by mouth daily.   Multiple Vitamin (MULTIVITAMIN) tablet Take 1 tablet by mouth daily.   nitroGLYCERIN  (NITROSTAT ) 0.4 MG SL tablet Place 0.4 mg under the tongue every 5 (five) minutes as needed for chest pain.   omeprazole  (PRILOSEC) 20 MG capsule TAKE 1 CAPSULE IN THE MORNING AND AT BEDTIME.   Semaglutide -Weight Management (WEGOVY ) 1.7 MG/0.75ML SOAJ Inject 1.7 mg into the skin once a week.   spironolactone  (ALDACTONE ) 25 MG tablet Take 0.5 tablets (12.5 mg total) by mouth daily.    Physical Exam:    VS:  BP 109/76   Pulse 84   Ht 5' 5 (1.651 m)   Wt 181 lb (82.1 kg)   SpO2 94%   BMI 30.12 kg/m    Wt Readings from Last 3 Encounters:  08/30/23 181 lb (82.1 kg)  08/18/23 180 lb (81.6 kg)  06/14/23 182 lb 4.8 oz (82.7 kg)    GEN: Well nourished, well developed in no acute  distress NECK: No JVD; No carotid bruits CARDIAC: RRR, no murmurs, rubs, gallops RESPIRATORY:  Clear to auscultation without rales, wheezing or rhonchi  ABDOMEN: Soft, non-tender, non-distended EXTREMITIES:  No edema; No acute deformity. No acute hardened areas of skin  Asessement and Plan:.    1. Shortness of breath, dizziness, CAD with chest pain, generalized fatigue, episodic palpitations - symptoms difficult to sort out, superimposed on long history of chronic intermittent chest pains. Thankfully her troponins were negative and EKG consistent with prior. She has known obstructive CAD and substrate for angina, but unsure that repeat stress test would be helpful given known abnormality on the prior. I will plan to discuss ischemic plan with Dr. Pietro. With the fairly acute onset/offset of symptoms and intermittent association with palpitations, query whether she is experiencing an intermittent arrhythmia. Will arrange 2 week Zio and updated echocardiogram. I will also add TSH for palpitations, pBNP for SOB  and patient-perceived edema not present on exam today, and ferritin for generalized fatigue.   2. Lung nodule and mildly dilated ascending aorta - she was originally due for repeat CT in 10/2023 but did have a CTA in the hospital earlier this month. Per CT 08/18/23, small RIGHT middle lobe pulmonary nodule measures 5 mm on image 76. No change in size from CT 10/10/2022 consistent benign etiology. No follow-up recommended. The CT did not comment on aortic size so I called [facility] as the report had cut off mid sentence in the Cardiovascular section and I wanted to know if they could assess the dimension by that study. I have to leave a message for them to call me back. Addendum: the radiology reading room took down the information and will have Dr. Malva review upon his return tomorrow. They will call me with an update.  3. Essential HTN - BP normal though lower end. I will discuss  her care with Dr. Pietro as we may adjust her medications for the above.  4. HLD - intolerant of statins, Zetia  and Repatha . Has not wished to pursue other lipid lowering measures.  5. Mild AI - follow with echo as above.  Addendum: reviewed with Dr. Pietro. He said we could repeat stress PET if patient wished, he confirms she has had problems with continuing chest pain. I left a message to call the office back so I can discuss. I will also plan to cut down her losartan  to 25mg  daily given episodic dizziness (SBP 109) to see if this helps. That way we could potentially trial low dose amlodipine  or diltiazem in the future depending on what shows up on testing.   Disposition: F/u with Dr. Pietro as scheduled 11/2023, sooner if testing warrants. I could not appreciate any specific abnormalities with her right thigh knot or edema. Recommended f/u PCP for this, may need skin biopsy or ultrasound of the area if distorted perception continues.  Signed, Xhaiden Coombs N Edis Huish, PA-C

## 2023-08-30 ENCOUNTER — Ambulatory Visit: Attending: Physician Assistant | Admitting: Physician Assistant

## 2023-08-30 ENCOUNTER — Ambulatory Visit

## 2023-08-30 ENCOUNTER — Encounter: Payer: Self-pay | Admitting: Physician Assistant

## 2023-08-30 VITALS — BP 109/76 | HR 84 | Ht 65.0 in | Wt 181.0 lb

## 2023-08-30 DIAGNOSIS — R0602 Shortness of breath: Secondary | ICD-10-CM

## 2023-08-30 DIAGNOSIS — I351 Nonrheumatic aortic (valve) insufficiency: Secondary | ICD-10-CM

## 2023-08-30 DIAGNOSIS — R42 Dizziness and giddiness: Secondary | ICD-10-CM

## 2023-08-30 DIAGNOSIS — I77810 Thoracic aortic ectasia: Secondary | ICD-10-CM

## 2023-08-30 DIAGNOSIS — R079 Chest pain, unspecified: Secondary | ICD-10-CM | POA: Diagnosis not present

## 2023-08-30 DIAGNOSIS — R5383 Other fatigue: Secondary | ICD-10-CM

## 2023-08-30 DIAGNOSIS — R002 Palpitations: Secondary | ICD-10-CM | POA: Diagnosis not present

## 2023-08-30 DIAGNOSIS — E785 Hyperlipidemia, unspecified: Secondary | ICD-10-CM

## 2023-08-30 DIAGNOSIS — I2510 Atherosclerotic heart disease of native coronary artery without angina pectoris: Secondary | ICD-10-CM | POA: Diagnosis not present

## 2023-08-30 DIAGNOSIS — I10 Essential (primary) hypertension: Secondary | ICD-10-CM

## 2023-08-30 DIAGNOSIS — R911 Solitary pulmonary nodule: Secondary | ICD-10-CM

## 2023-08-30 NOTE — Progress Notes (Unsigned)
Enrolled patient for a 14 day Zio XT monitor to be mailed to patients home  [place] to read

## 2023-08-30 NOTE — Patient Instructions (Addendum)
 Medication Instructions:   Your physician recommends that you continue on your current medications as directed. Please refer to the Current Medication list given to you today.  *If you need a refill on your cardiac medications before your next appointment, please call your pharmacy*   Lab Work:  PLEASE GO DOWN STAIRS  [facility]  FIRST FLOOR ( GET OFF ELEVATORS WALK TOWARDS WAITING AREA LAB LOCATED BY PHARMACY): BNP TSH AND FERRITIN LEVEL TODAY   If you have labs (blood work) drawn today and your tests are completely normal, you will receive your results only by: MyChart Message (if you have MyChart) OR A paper copy in the mail If you have any lab test that is abnormal or we need to change your treatment, we will call you to review the results.   Testing/Procedures: Your physician has requested that you have an echocardiogram. Echocardiography is a painless test that uses sound waves to create images of your heart. It provides your doctor with information about the size and shape of your heart and how well your heart's chambers and valves are working. This procedure takes approximately one hour. There are no restrictions for this procedure. Please do NOT wear cologne, perfume, aftershave, or lotions (deodorant is allowed). Please arrive 15 minutes prior to your appointment time.  Please note: We ask at that you not bring children with you during ultrasound (echo/ vascular) testing. Due to room size and safety concerns, children are not allowed in the ultrasound rooms during exams. Our front office staff cannot provide observation of children in our lobby area while testing is being conducted. An adult accompanying a patient to their appointment will only be allowed in the ultrasound room at the discretion of the ultrasound technician under special circumstances. We apologize for any inconvenience.   Your physician has recommended that you wear an event monitor. Event monitors are medical devices  that record the heart's electrical activity. Doctors most often us  these monitors to diagnose arrhythmias. Arrhythmias are problems with the speed or rhythm of the heartbeat. The monitor is a small, portable device. You can wear one while you do your normal daily activities. This is usually used to diagnose what is causing palpitations/syncope (passing out).    Follow-Up: At [facility], you and your health needs are our priority.  As part of our continuing mission to provide you with exceptional heart care, our providers are all part of one team.  This team includes your primary Cardiologist (physician) and Advanced Practice Providers or APPs (Physician Assistants and Nurse Practitioners) who all work together to provide you with the care you need, when you need it.   Your next appointment:  AS SCHEDULED CRENSHAW   We recommend signing up for the patient portal called MyChart.  Sign up information is provided on this After Visit Summary.  MyChart is used to connect with patients for Virtual Visits (Telemedicine).  Patients are able to view lab/test results, encounter notes, upcoming appointments, etc.  Non-urgent messages can be sent to your provider as well.   To learn more about what you can do with MyChart, go to [email or web address].   Other Instructions     ZIO XT- Long Term Monitor Instructions  Your physician has requested you wear a ZIO patch monitor for 14 days.  This is a single patch monitor. [employer] supplies one patch monitor per enrollment. Additional stickers are not available. Please do not apply patch if you will be having a Nuclear Stress Test,  Echocardiogram,  Cardiac CT, MRI, or Chest Xray during the period you would be wearing the  monitor. The patch cannot be worn during these tests. You cannot remove and re-apply the  ZIO XT patch monitor.  Your ZIO patch monitor will be mailed 3 day USPS to your address on file. It may take 3-5 days  to receive your  monitor after you have been enrolled.  Once you have received your monitor, please review the enclosed instructions. Your monitor  has already been registered assigning a specific monitor serial # to you.  Billing and Patient Assistance Program Information  We have supplied [employer] with any of your insurance information on file for billing purposes. [employer] offers a sliding scale Patient Assistance Program for patients that do not have  insurance, or whose insurance does not completely cover the cost of the ZIO monitor.  You must apply for the Patient Assistance Program to qualify for this discounted rate.  To apply, please call [employer] at [phone], select option 4, select option 2, ask to apply for  Patient Assistance Program. Meredeth will ask your household income, and how many people  are in your household. They will quote your out-of-pocket cost based on that information.  [employer] will also be able to set up a 52-month, interest-free payment plan if needed.  Applying the monitor   Shave hair from upper left chest.  Hold abrader disc by orange tab. Rub abrader in 40 strokes over the upper left chest as  indicated in your monitor instructions.  Clean area with 4 enclosed alcohol pads. Let dry.  Apply patch as indicated in monitor instructions. Patch will be placed under collarbone on left  side of chest with arrow pointing upward.  Rub patch adhesive wings for 2 minutes. Remove white label marked 1. Remove the white  label marked 2. Rub patch adhesive wings for 2 additional minutes.  While looking in a mirror, press and release button in center of patch. A small green light will  flash 3-4 times. This will be your only indicator that the monitor has been turned on.  Do not shower for the first 24 hours. You may shower after the first 24 hours.  Press the button if you feel a symptom. You will hear a small click. Record Date, Time and  Symptom in the Patient Logbook.  When you  are ready to remove the patch, follow instructions on the last 2 pages of Patient  Logbook. Stick patch monitor onto the last page of Patient Logbook.  Place Patient Logbook in the blue and white box. Use locking tab on box and tape box closed  securely. The blue and white box has prepaid postage on it. Please place it in the mailbox as  soon as possible. Your physician should have your test results approximately 7 days after the  monitor has been mailed back to [facility].  Call [facility] Customer Care at [phone] if you have questions regarding  your ZIO XT patch monitor. Call them immediately if you see an orange light blinking on your  monitor.  If your monitor falls off in less than 4 days, contact our Monitor department at [phone].  If your monitor becomes loose or falls off after 4 days call [employer] at [phone] for  suggestions on securing your monitor

## 2023-08-31 ENCOUNTER — Ambulatory Visit: Payer: Self-pay | Admitting: Physician Assistant

## 2023-08-31 LAB — PRO B NATRIURETIC PEPTIDE: NT-Pro BNP: 48 pg/mL (ref 0–301)

## 2023-08-31 LAB — TSH: TSH: 0.727 u[IU]/mL (ref 0.450–4.500)

## 2023-09-04 NOTE — Telephone Encounter (Signed)
 Spoke with patient, has an upcoming beach trip and will plan on wearing cardiac monitor after that time period is over. Instructed patient, cardiac monitoring is needed to get a better idea of where her symptoms are coming from. Patient agrees and still plans to wear after her beach trip. No further needs at this time

## 2023-09-13 ENCOUNTER — Encounter: Payer: Self-pay | Admitting: Family Medicine

## 2023-09-13 ENCOUNTER — Other Ambulatory Visit: Payer: Self-pay | Admitting: Family Medicine

## 2023-09-13 ENCOUNTER — Ambulatory Visit: Admitting: Obstetrics

## 2023-09-13 DIAGNOSIS — M791 Myalgia, unspecified site: Secondary | ICD-10-CM

## 2023-09-13 MED ORDER — DULOXETINE HCL 20 MG PO CPEP: 40.0000 mg | ORAL_CAPSULE | Freq: Every day | ORAL | 1 refills | Status: DC

## 2023-09-17 ENCOUNTER — Other Ambulatory Visit: Payer: Self-pay | Admitting: Cardiology

## 2023-09-17 ENCOUNTER — Other Ambulatory Visit: Payer: Self-pay | Admitting: Family Medicine

## 2023-09-17 DIAGNOSIS — I10 Essential (primary) hypertension: Secondary | ICD-10-CM

## 2023-09-23 ENCOUNTER — Other Ambulatory Visit: Payer: Self-pay | Admitting: Family Medicine

## 2023-09-23 DIAGNOSIS — F411 Generalized anxiety disorder: Secondary | ICD-10-CM

## 2023-09-26 ENCOUNTER — Other Ambulatory Visit (HOSPITAL_COMMUNITY): Payer: Self-pay

## 2023-10-11 ENCOUNTER — Encounter: Payer: Self-pay | Admitting: *Deleted

## 2023-10-16 ENCOUNTER — Ambulatory Visit (HOSPITAL_COMMUNITY)
Admission: RE | Admit: 2023-10-16 | Discharge: 2023-10-16 | Disposition: A | Discharge status: Home / Self Care | Source: Ambulatory Visit | Attending: Cardiology | Admitting: Cardiology

## 2023-10-16 DIAGNOSIS — R0602 Shortness of breath: Secondary | ICD-10-CM | POA: Diagnosis not present

## 2023-10-16 LAB — ECHOCARDIOGRAM COMPLETE
Area-P 1/2: 3.68 cm2
P 1/2 time: 656 ms
S' Lateral: 3.1 cm

## 2023-10-23 ENCOUNTER — Other Ambulatory Visit (HOSPITAL_BASED_OUTPATIENT_CLINIC_OR_DEPARTMENT_OTHER): Payer: Self-pay

## 2023-10-23 ENCOUNTER — Other Ambulatory Visit (HOSPITAL_COMMUNITY): Payer: Self-pay

## 2023-10-23 ENCOUNTER — Other Ambulatory Visit: Payer: Self-pay | Admitting: Family Medicine

## 2023-10-23 MED ORDER — WEGOVY 1.7 MG/0.75ML ~~LOC~~ SOAJ
1.7000 mg | SUBCUTANEOUS | 2 refills | Status: DC
  Filled 2023-10-23: qty 3, 28d supply, fill #0
  Filled 2023-11-18: qty 3, 28d supply, fill #1
  Filled 2023-12-16: qty 3, 28d supply, fill #2

## 2023-11-01 ENCOUNTER — Encounter: Payer: Self-pay | Admitting: *Deleted

## 2023-11-06 NOTE — Progress Notes (Signed)
 HPI: FU CAD. Pt had NSTEMI 3/15 with complicated PCI of RCA (performed in [facility]). ABI 12/15 normal. Cath 6/16 showed 100 PDA, patent stents in proximal and mid RCA, no obstructive disease in the left system; collaterals to the PDA noted; attempt at PCI of CTO unsuccessful. Last cath 10/17 showed 100 PDA (filled with left to right collaterals), patent RCA stent and normal LV function.  Dr Swaziland reviewed and did not think attempt at CTO intervention or CABG was appropriate.  Neck CTA June 2021 showed no significant carotid stenosis. PET scan November 2023 showed mild ischemia in the apical to mid inferior wall and ejection fraction 51%. CTA June 2025 showed no pulmonary embolus.  Echocardiogram August 2025 showed normal LV function, mild left ventricular hypertrophy, grade 1 diastolic dysfunction, mild left atrial enlargement, mild aortic insufficiency, mildly dilated aortic root at 44 mm.  H/O cough with ACEI. Myalgias with crestor. Since last seen, she does have some dyspnea that she attributes to COPD.  She continues to have intermittent chest pain that is unchanged.  Can occur with activities and occasionally at rest.  This has been present since her previous stent was placed.  Current Outpatient Medications  Medication Sig Dispense Refill   acetaminophen  (TYLENOL ) 500 MG tablet Take 1,000 mg by mouth every 8 (eight) hours as needed (for pain or headaches).     albuterol  (VENTOLIN  HFA) 108 (90 Base) MCG/ACT inhaler INHALE 1 TO 2 PUFFS INTO THE LUNGS EVERY 6 HOURS AS NEEDED FOR WHEEZING OR SHORTNESS OF BREATH. 1 each 11   amitriptyline  (ELAVIL ) 50 MG tablet TAKE 1 TABLET AT BEDTIME 90 tablet 3   aspirin  EC 81 MG tablet Take 81 mg by mouth daily at 6 PM.      B Complex Vitamins (VITAMIN B COMPLEX PO) Take 1 tablet by mouth daily.     buPROPion  (WELLBUTRIN  XL) 150 MG 24 hr tablet TAKE 1 TABLET EVERY DAY 90 tablet 3   chlorpheniramine-HYDROcodone  (TUSSIONEX) 10-8 MG/5ML 5 ml every 12 hours if needed  for cough 115 mL 0   Cholecalciferol  (VITAMIN D3) 5000 units CAPS Take 5,000 Units by mouth daily.      cyclobenzaprine  (FLEXERIL ) 10 MG tablet TAKE 1 TABLET THREE TIMES DAILY AS NEEDED FOR MUSCLE SPASM(S) 30 tablet 1   DULoxetine  (CYMBALTA ) 20 MG capsule Take 2 capsules (40 mg total) by mouth daily. 180 capsule 1   fexofenadine  (ALLEGRA ) 180 MG tablet Take 1 tablet (180 mg total) by mouth daily. 90 tablet 3   hydrOXYzine  (ATARAX ) 25 MG tablet TAKE 1 TO 2 TABLETS EVERY 6 TO 8 HOURS AS NEEDED FOR ANXIETY 60 tablet 2   levothyroxine  (SYNTHROID ) 175 MCG tablet TAKE 1 TABLET EVERY DAY 90 tablet 3   losartan  (COZAAR ) 50 MG tablet TAKE 1 TABLET EVERY DAY 90 tablet 3   LYSINE PO Take 2,000 mg by mouth daily.     Multiple Vitamin (MULTIVITAMIN) tablet Take 1 tablet by mouth daily.     nitroGLYCERIN  (NITROSTAT ) 0.4 MG SL tablet Place 0.4 mg under the tongue every 5 (five) minutes as needed for chest pain.     omeprazole  (PRILOSEC) 20 MG capsule TAKE 1 CAPSULE IN THE MORNING AND AT BEDTIME. 180 capsule 3   semaglutide -weight management (WEGOVY ) 1.7 MG/0.75ML SOAJ SQ injection Inject 1.7 mg into the skin once a week. 3 mL 2   spironolactone  (ALDACTONE ) 25 MG tablet TAKE 1/2 TABLET EVERY DAY 45 tablet 3   No current facility-administered medications for  this visit.     Past Medical History:  Diagnosis Date   Allergy    Anxiety    COPD (chronic obstructive pulmonary disease) (HCC)    Coronary artery disease    a. s/p PCI of RCA in 2015 b. cath in 12/2015 showed 100% Ost RPDA with collaterals present   DDD (degenerative disc disease), cervical    Depression    DJD (degenerative joint disease) of knee    RT   GERD (gastroesophageal reflux disease)    Hashimoto's disease    Headache(784.0)    Hyperlipidemia    Hypertension    Hypothyroidism    Myocardial infarction (HCC)    Nocturnal hypoxemia 02/26/2015   On supplemental oxygen  therapy    Oxygen  2.5 l/m at bedtime.   Stented coronary artery  02/26/2015    Past Surgical History:  Procedure Laterality Date   ABDOMINAL HYSTERECTOMY     APPENDECTOMY  03/07/1969   BUNIONECTOMY Right    CARDIAC CATHETERIZATION N/A 09/02/2014   Procedure: Left Heart Cath and Coronary Angiography;  Surgeon: Gordy Bergamo, MD;  Location: [facility] INVASIVE CV LAB;  Service: Cardiovascular;  Laterality: N/A;   CARDIAC CATHETERIZATION N/A 12/15/2015   Procedure: Left Heart Cath and Coronary Angiography;  Surgeon: Candyce GORMAN Reek, MD;  Location: [facility] INVASIVE CV LAB;  Service: Cardiovascular;  Laterality: N/A;   CERVICAL FUSION     CESAREAN SECTION     COLONOSCOPY WITH PROPOFOL  N/A 02/24/2015   Procedure: COLONOSCOPY WITH PROPOFOL ;  Surgeon: Gladis MARLA Louder, MD;  Location: [facility] ENDOSCOPY;  Service: Endoscopy;  Laterality: N/A;   ESOPHAGOGASTRODUODENOSCOPY (EGD) WITH PROPOFOL  N/A 02/24/2015   Procedure: ESOPHAGOGASTRODUODENOSCOPY (EGD) WITH PROPOFOL ;  Surgeon: Gladis MARLA Louder, MD;  Location: [facility] ENDOSCOPY;  Service: Endoscopy;  Laterality: N/A;   HAND SURGERY     due to trauma-dog bite.   I & D EXTREMITY Bilateral 05/17/2013   Procedure: IRRIGATION AND DEBRIDEMENT EXTREMITY;  Surgeon: Elsie Mussel, MD;  Location: [facility] OR;  Service: Orthopedics;  Laterality: Bilateral;   LEFT HEART CATHETERIZATION WITH CORONARY ANGIOGRAM N/A 03/04/2014   Procedure: LEFT HEART CATHETERIZATION WITH CORONARY ANGIOGRAM;  Surgeon: Erick JONELLE Bergamo, MD;  Location: [facility] CATH LAB;  Service: Cardiovascular;  Laterality: N/A;   OOPHORECTOMY     SHOULDER SURGERY     RT   THYROIDECTOMY  03/07/1994   TONSILLECTOMY  03/08/1963   TOTAL ABDOMINAL HYSTERECTOMY W/ BILATERAL SALPINGOOPHORECTOMY  01/06/2008   with cervical dysplasia   TUBAL LIGATION  03/07/1982   WRIST SURGERY Left 08/21/2022    Social History   Socioeconomic History   Marital status: Widowed    Spouse name: Not on file   Number of children: 1   Years of education: Not on file   Highest education level: Not on file   Occupational History   Occupation: disabled    Employer: [employer]  Tobacco Use   Smoking status: Former    Current packs/day: 0.00    Average packs/day: 2.0 packs/day for 25.0 years (50.0 ttl pk-yrs)    Types: Cigarettes    Start date: 04/24/1971    Quit date: 04/23/1996    Years since quitting: 27.5    Passive exposure: Never   Smokeless tobacco: Never  Vaping Use   Vaping status: Never Used  Substance and Sexual Activity   Alcohol use: No    Alcohol/week: 0.0 standard drinks of alcohol   Drug use: No   Sexual activity: Not Currently    Partners: Male    Birth control/protection: Post-menopausal  Other Topics Concern   Not on file  Social History Narrative   On worker's comp for lower back and right shoulder - applying for disability; losing cobra insurance 03/07/2010   Social Drivers of Health   Financial Resource Strain: Low Risk  (08/03/2023)   Overall Financial Resource Strain (CARDIA)    Difficulty of Paying Living Expenses: Not hard at all  Food Insecurity: No Food Insecurity (08/03/2023)   Hunger Vital Sign    Worried About Running Out of Food in the Last Year: Never true    Ran Out of Food in the Last Year: Never true  Transportation Needs: No Transportation Needs (08/03/2023)   PRAPARE - Administrator, Civil Service (Medical): No    Lack of Transportation (Non-Medical): No  Physical Activity: Sufficiently Active (08/03/2023)   Exercise Vital Sign    Days of Exercise per Week: 5 days    Minutes of Exercise per Session: 30 min  Stress: No Stress Concern Present (08/03/2023)   [employer] of Occupational Health - Occupational Stress Questionnaire    Feeling of Stress : Only a little  Social Connections: Moderately Integrated (08/03/2023)   Social Connection and Isolation Panel    Frequency of Communication with Friends and Family: More than three times a week    Frequency of Social Gatherings with Friends and Family: More than three times a week     Attends Religious Services: More than 4 times per year    Active Member of [employer] or Organizations: No    Attends Banker Meetings: Never    Marital Status: Married  Catering manager Violence: Not At Risk (08/03/2023)   Humiliation, Afraid, Rape, and Kick questionnaire    Fear of Current or Ex-Partner: No    Emotionally Abused: No    Physically Abused: No    Sexually Abused: No    Family History  Problem Relation Age of Onset   Lung cancer Mother    Hypertension Mother    Stroke Mother    Diabetes Mother    Diabetes Father    Hypertension Father    Heart attack Father    Asthma Father    COPD Father    Alcohol abuse Father    Breast cancer Sister 71   Diabetes Sister    Hypertension Sister    Uterine cancer Neg Hx    Bladder Cancer Neg Hx    Renal cancer Neg Hx     ROS: no fevers or chills, productive cough, hemoptysis, dysphasia, odynophagia, melena, hematochezia, dysuria, hematuria, rash, seizure activity, orthopnea, PND, pedal edema, claudication. Remaining systems are negative.  Physical Exam: Well-developed well-nourished in no acute distress.  Skin is warm and dry.  HEENT is normal.  Neck is supple.  Chest is clear to auscultation with normal expansion.  Cardiovascular exam is regular rate and rhythm.  Abdominal exam nontender or distended. No masses palpated. Extremities show no edema. neuro grossly intact  EKG Interpretation Date/Time:  Friday November 17 2023 09:52:10 EDT Ventricular Rate:  83 PR Interval:  152 QRS Duration:  92 QT Interval:  360 QTC Calculation: 423 R Axis:   26  Text Interpretation: Normal sinus rhythm Cannot rule out Anterior infarct Confirmed by Pietro Rogue ([id number]) on 11/17/2023 10:00:28 AM    A/P  1 chest pain-patient has had problems with chronic chest pain; it is unchanged since her previous stent was placed.  Previous PET scan showed ischemia in the distribution of her occluded PDA which was known  previously.  Patient is felt not to be a good candidate for PCI of CTO by Dr. Swaziland.  Plan will be continued medical therapy with aspirin .  She is intolerant to statins.  2 coronary artery disease-plan as outlined above.  3 dilated ascending aorta-recent CTA June 2025 showed ascending aorta measuring 37 mm.  4 hyperlipidemia-intolerant to statins, Zetia  and Repatha .  Not interested in other lipid-lowering medications.  5 hypertension-patient's blood pressure is controlled.  Continue present medical regimen.  Redell Shallow, MD

## 2023-11-17 ENCOUNTER — Encounter: Payer: Self-pay | Admitting: Cardiology

## 2023-11-17 ENCOUNTER — Ambulatory Visit: Attending: Cardiology | Admitting: Cardiology

## 2023-11-17 VITALS — BP 102/76 | HR 83 | Ht 68.0 in | Wt 188.0 lb

## 2023-11-17 DIAGNOSIS — R0602 Shortness of breath: Secondary | ICD-10-CM

## 2023-11-17 DIAGNOSIS — I10 Essential (primary) hypertension: Secondary | ICD-10-CM | POA: Diagnosis not present

## 2023-11-17 DIAGNOSIS — R079 Chest pain, unspecified: Secondary | ICD-10-CM | POA: Diagnosis not present

## 2023-11-17 DIAGNOSIS — E785 Hyperlipidemia, unspecified: Secondary | ICD-10-CM | POA: Diagnosis not present

## 2023-11-17 DIAGNOSIS — I2510 Atherosclerotic heart disease of native coronary artery without angina pectoris: Secondary | ICD-10-CM

## 2023-11-17 NOTE — Patient Instructions (Signed)

## 2023-11-29 ENCOUNTER — Other Ambulatory Visit: Payer: Self-pay | Admitting: *Deleted

## 2023-11-29 ENCOUNTER — Other Ambulatory Visit

## 2023-11-29 DIAGNOSIS — Z131 Encounter for screening for diabetes mellitus: Secondary | ICD-10-CM

## 2023-11-29 DIAGNOSIS — E7800 Pure hypercholesterolemia, unspecified: Secondary | ICD-10-CM

## 2023-11-29 DIAGNOSIS — E039 Hypothyroidism, unspecified: Secondary | ICD-10-CM

## 2023-11-29 DIAGNOSIS — I10 Essential (primary) hypertension: Secondary | ICD-10-CM | POA: Diagnosis not present

## 2023-11-30 ENCOUNTER — Ambulatory Visit: Payer: Self-pay | Admitting: Family Medicine

## 2023-11-30 LAB — LIPID PANEL
Chol/HDL Ratio: 4.4 ratio (ref 0.0–4.4)
Cholesterol, Total: 204 mg/dL — ABNORMAL HIGH (ref 100–199)
HDL: 46 mg/dL (ref >39)
LDL Chol Calc (NIH): 128 mg/dL — ABNORMAL HIGH (ref 0–99)
Triglycerides: 169 mg/dL — ABNORMAL HIGH (ref 0–149)
VLDL Cholesterol Cal: 30 mg/dL (ref 5–40)

## 2023-11-30 LAB — COMPREHENSIVE METABOLIC PANEL WITH GFR
ALT: 47 IU/L — ABNORMAL HIGH (ref 0–32)
AST: 48 IU/L — ABNORMAL HIGH (ref 0–40)
Albumin: 4.5 g/dL (ref 3.9–4.9)
Alkaline Phosphatase: 109 IU/L (ref 49–135)
BUN/Creatinine Ratio: 14 (ref 12–28)
BUN: 13 mg/dL (ref 8–27)
Bilirubin Total: 0.3 mg/dL (ref 0.0–1.2)
CO2: 22 mmol/L (ref 20–29)
Calcium: 9.4 mg/dL (ref 8.7–10.3)
Chloride: 102 mmol/L (ref 96–106)
Creatinine, Ser: 0.9 mg/dL (ref 0.57–1.00)
Globulin, Total: 2.3 g/dL (ref 1.5–4.5)
Glucose: 86 mg/dL (ref 70–99)
Potassium: 4.5 mmol/L (ref 3.5–5.2)
Sodium: 139 mmol/L (ref 134–144)
Total Protein: 6.8 g/dL (ref 6.0–8.5)
eGFR: 71 mL/min/1.73 (ref >59)

## 2023-11-30 LAB — CBC WITH DIFFERENTIAL/PLATELET
Basophils Absolute: 0.1 x10E3/uL (ref 0.0–0.2)
Basos: 1 %
EOS (ABSOLUTE): 0.2 x10E3/uL (ref 0.0–0.4)
Eos: 3 %
Hematocrit: 43.5 % (ref 34.0–46.6)
Hemoglobin: 14.5 g/dL (ref 11.1–15.9)
Immature Grans (Abs): 0 x10E3/uL (ref 0.0–0.1)
Immature Granulocytes: 0 %
Lymphocytes Absolute: 2.1 x10E3/uL (ref 0.7–3.1)
Lymphs: 32 %
MCH: 30.3 pg (ref 26.6–33.0)
MCHC: 33.3 g/dL (ref 31.5–35.7)
MCV: 91 fL (ref 79–97)
Monocytes Absolute: 0.5 x10E3/uL (ref 0.1–0.9)
Monocytes: 7 %
Neutrophils Absolute: 3.7 x10E3/uL (ref 1.4–7.0)
Neutrophils: 57 %
Platelets: 324 x10E3/uL (ref 150–450)
RBC: 4.78 x10E6/uL (ref 3.77–5.28)
RDW: 12.6 % (ref 11.7–15.4)
WBC: 6.5 x10E3/uL (ref 3.4–10.8)

## 2023-11-30 LAB — TSH: TSH: 0.639 u[IU]/mL (ref 0.450–4.500)

## 2023-11-30 LAB — HEMOGLOBIN A1C
Est. average glucose Bld gHb Est-mCnc: 103 mg/dL
Hgb A1c MFr Bld: 5.2 % (ref 4.8–5.6)

## 2023-12-06 ENCOUNTER — Encounter: Payer: Self-pay | Admitting: Family Medicine

## 2023-12-06 ENCOUNTER — Ambulatory Visit: Admitting: Family Medicine

## 2023-12-06 VITALS — BP 118/85 | HR 96 | Ht 68.0 in | Wt 186.4 lb

## 2023-12-06 DIAGNOSIS — J3089 Other allergic rhinitis: Secondary | ICD-10-CM | POA: Diagnosis not present

## 2023-12-06 DIAGNOSIS — Z23 Encounter for immunization: Secondary | ICD-10-CM | POA: Diagnosis not present

## 2023-12-06 DIAGNOSIS — R748 Abnormal levels of other serum enzymes: Secondary | ICD-10-CM | POA: Diagnosis not present

## 2023-12-06 DIAGNOSIS — I10 Essential (primary) hypertension: Secondary | ICD-10-CM | POA: Diagnosis not present

## 2023-12-06 DIAGNOSIS — Z0000 Encounter for general adult medical examination without abnormal findings: Secondary | ICD-10-CM | POA: Diagnosis not present

## 2023-12-06 DIAGNOSIS — J302 Other seasonal allergic rhinitis: Secondary | ICD-10-CM | POA: Diagnosis not present

## 2023-12-06 NOTE — Assessment & Plan Note (Signed)
-   Blood pressure noted to be elevated in office. Currently taking spironolactone  and losartan . - Instructed to monitor blood pressure at home with own machine. No medication changes at this time.

## 2023-12-06 NOTE — Patient Instructions (Signed)
 It was nice to see you today,  We addressed the following topics today: -From what I could tell in the liver tox website, the curcumin and turmeric and aloe vera are the only ingredients that have been linked to elevated liver enzymes.  For many of the readings I could not find any information about it. - I would recommend not taking any of these medications right now. - You can take a supplement that just has glucosamine and chondroitin in it.  Just do not get 1 with a bunch of other ingredients. - I am ordering a liver elastography test for you.  Someone will call you to schedule this. - We will recheck your liver enzymes in 1 month.   Have a great day,  Rolan Slain, MD

## 2023-12-06 NOTE — Progress Notes (Unsigned)
 Annual physical  Subjective    Patient ID: Amy Hodge, female    DOB: 02/19/1956  Age: 68 y.o. MRN: [id number]  Chief Complaint  Patient presents with   Annual Exam   HPI Amy Hodge is a 68 y.o. old female here  for annual exam.   Subjective - Presents with concern for elevated liver enzymes. Also reports symptoms of a sinus infection, including sore throat and headache for 4 days. Headache is frontal, over the eyes. Reports seasonal allergies. Using a saline nasal spray. - Reports starting several new supplements for hip and bladder pain, which began around the same time. One supplement, Pain Away (active ingredients palmitoylethanolamide and serratiopeptidase), caused significant anxiety and a feeling of a trembling heart rate, and interfered with sleep. Has stopped this supplement for almost one month, with improvement in anxiety and sleep. Another supplement, Curamin, contains phenylalanine, boswellia, curcumin rhizome extract, turmeric, nattokinase, and bromelain. A third supplement contains glucosamine, chondroitin, hyaluronic acid, and methylsulfonylmethane, but also lists other ingredients like thuja, ginger, willow bark, aloe vera, harpagophytum, and green tea on a separate part of the label. These supplements were obtained from an individual seller, not a store.  Medications Current prescription medications include spironolactone  1/2 tab daily (blood pressure), losartan  (blood pressure), amitriptyline  (anxiety, sleep), duloxetine  (pain), bupropion , vitamin D  5000 units daily, Allegra  daily, hydroxyzine  as needed (allergies/anxiety), and Synthroid . Also taking Wegovy  1.7 mg, which is perceived to be effective.  PMH, PSH, FH, Social Hx PMHx: Hypertension, anxiety, chronic pain (hips, bladder), seasonal allergies, hypothyroidism, obesity. History of elevated liver enzymes (AST, ALT). History of mild hepatomegaly and heterogeneous enhancement of the liver on a CT chest in 2024,  suggestive of fatty liver. CT chest in June for shortness of breath. PSH: None mentioned. FH: Not mentioned. Social Hx: Denies current alcohol use, last use ~45 years ago. Denies tobacco use. Retired/disabled. Widowed. Has one child (age 47), three grandchildren, and one great-grandchild, all living nearby. Enjoys gardening (vegetable and flower) and yard work on a 3+ acre property using a Animal nutritionist.  ROS HEENT: Positive for sore throat and headache. Denies other symptoms. MSK: Positive for hip pain. Psych: Reports significant anxiety and insomnia previously, improved after stopping a supplement.   The ASCVD Risk score (Arnett DK, et al., 2019) failed to calculate for the following reasons:   Risk score cannot be calculated because patient has a medical history suggesting prior/existing ASCVD  Health Maintenance Due  Topic Date Due   COVID-19 Vaccine (3 - Pfizer risk series) 05/04/2020      Objective:     BP 118/85   Pulse 96   Ht 5' 8 (1.727 m)   Wt 186 lb 6.4 oz (84.6 kg)   SpO2 92%   BMI 28.34 kg/m  {Vitals History (Optional):23777}  Physical Exam General: No acute distress. HEENT: Oropharynx clear. EOMI. Wears upper and lower dentures. Neck: Supple, no JVD. Cv: rrr, no murmur Pulm: Lungs clear to auscultation bilaterally. Abdomen: Soft. Mild tenderness to palpation in the right upper quadrant. No rebound or guarding. Bowel sounds normal. Extremities: No edema. Psych: pleasant affect   No results found for any visits on 12/06/23.      Assessment & Plan:   Physical exam, annual  HTN, goal below 130/80 Assessment & Plan: - Blood pressure noted to be elevated in office. Currently taking spironolactone  and losartan . - Instructed to monitor blood pressure at home with own machine. No medication changes at this time.   Seasonal  and perennial allergic rhinitis Assessment & Plan: - Reports 4-day history of frontal headache and sore throat,  consistent with sinus inflammation, likely related to seasonal allergies. - Recommend starting fluticasone  (or generic) nasal spray, two sprays per nostril daily until symptoms resolve. - Continue Allegra  daily. - Can continue saline nasal spray as needed.   Elevated liver enzymes Assessment & Plan: - Elevated AST and ALT on recent labs. History of mild hepatomegaly and fatty liver changes on CT scan from 2024. No alcohol use. Multiple new, unregulated supplements were recently started, which could be contributing. Other labs including thyroid  function, A1C, and CBC are normal. Cholesterol is mildly elevated. Currently taking Wegovy , which is approved for fatty liver and not expected to cause this. - Order right upper quadrant ultrasound with liver elastography to evaluate for fatty liver and fibrosis. - Repeat liver function tests in 1-2 months. - Advised to stop taking the supplement Pain Away and the multi-ingredient supplement from the individual seller due to unknown ingredients and potential for hepatotoxicity. Discussed the livertox.gov website as a Theatre stage manager. - Counseled that if glucosamine/chondroitin is desired, to purchase a commercially available product from a reputable store. - Counseled on the progression of fatty liver to fibrosis and cirrhosis and the importance of monitoring.   Immunization due -     Flu vaccine HIGH DOSE PF(Fluzone Trivalent)     Return in about 6 months (around 06/05/2024) for htn, hypothyroid.    Toribio MARLA Slain, MD

## 2023-12-06 NOTE — Assessment & Plan Note (Signed)
-   Elevated AST and ALT on recent labs. History of mild hepatomegaly and fatty liver changes on CT scan from 2024. No alcohol use. Multiple new, unregulated supplements were recently started, which could be contributing. Other labs including thyroid  function, A1C, and CBC are normal. Cholesterol is mildly elevated. Currently taking Wegovy , which is approved for fatty liver and not expected to cause this. - Order right upper quadrant ultrasound with liver elastography to evaluate for fatty liver and fibrosis. - Repeat liver function tests in 1-2 months. - Advised to stop taking the supplement Pain Away and the multi-ingredient supplement from the individual seller due to unknown ingredients and potential for hepatotoxicity. Discussed the livertox.gov website as a Theatre stage manager. - Counseled that if glucosamine/chondroitin is desired, to purchase a commercially available product from a reputable store. - Counseled on the progression of fatty liver to fibrosis and cirrhosis and the importance of monitoring.

## 2023-12-06 NOTE — Assessment & Plan Note (Signed)
-   Reports 4-day history of frontal headache and sore throat, consistent with sinus inflammation, likely related to seasonal allergies. - Recommend starting fluticasone  (or generic) nasal spray, two sprays per nostril daily until symptoms resolve. - Continue Allegra  daily. - Can continue saline nasal spray as needed.

## 2023-12-14 DIAGNOSIS — J302 Other seasonal allergic rhinitis: Secondary | ICD-10-CM | POA: Diagnosis not present

## 2023-12-14 DIAGNOSIS — I509 Heart failure, unspecified: Secondary | ICD-10-CM | POA: Diagnosis not present

## 2023-12-14 DIAGNOSIS — F325 Major depressive disorder, single episode, in full remission: Secondary | ICD-10-CM | POA: Diagnosis not present

## 2023-12-14 DIAGNOSIS — E785 Hyperlipidemia, unspecified: Secondary | ICD-10-CM | POA: Diagnosis not present

## 2023-12-14 DIAGNOSIS — M810 Age-related osteoporosis without current pathological fracture: Secondary | ICD-10-CM | POA: Diagnosis not present

## 2023-12-14 DIAGNOSIS — E569 Vitamin deficiency, unspecified: Secondary | ICD-10-CM | POA: Diagnosis not present

## 2023-12-14 DIAGNOSIS — E663 Overweight: Secondary | ICD-10-CM | POA: Diagnosis not present

## 2023-12-14 DIAGNOSIS — I25119 Atherosclerotic heart disease of native coronary artery with unspecified angina pectoris: Secondary | ICD-10-CM | POA: Diagnosis not present

## 2023-12-14 DIAGNOSIS — I289 Disease of pulmonary vessels, unspecified: Secondary | ICD-10-CM | POA: Diagnosis not present

## 2023-12-14 DIAGNOSIS — Z888 Allergy status to other drugs, medicaments and biological substances status: Secondary | ICD-10-CM | POA: Diagnosis not present

## 2023-12-14 DIAGNOSIS — J439 Emphysema, unspecified: Secondary | ICD-10-CM | POA: Diagnosis not present

## 2023-12-14 DIAGNOSIS — K219 Gastro-esophageal reflux disease without esophagitis: Secondary | ICD-10-CM | POA: Diagnosis not present

## 2023-12-14 DIAGNOSIS — N3941 Urge incontinence: Secondary | ICD-10-CM | POA: Diagnosis not present

## 2023-12-14 DIAGNOSIS — I719 Aortic aneurysm of unspecified site, without rupture: Secondary | ICD-10-CM | POA: Diagnosis not present

## 2023-12-14 DIAGNOSIS — M62838 Other muscle spasm: Secondary | ICD-10-CM | POA: Diagnosis not present

## 2023-12-14 DIAGNOSIS — I110 Hypertensive heart disease with heart failure: Secondary | ICD-10-CM | POA: Diagnosis not present

## 2023-12-14 DIAGNOSIS — I359 Nonrheumatic aortic valve disorder, unspecified: Secondary | ICD-10-CM | POA: Diagnosis not present

## 2023-12-14 DIAGNOSIS — E890 Postprocedural hypothyroidism: Secondary | ICD-10-CM | POA: Diagnosis not present

## 2023-12-18 ENCOUNTER — Encounter: Payer: Self-pay | Admitting: Family Medicine

## 2023-12-20 ENCOUNTER — Other Ambulatory Visit: Payer: Self-pay | Admitting: Family Medicine

## 2023-12-20 DIAGNOSIS — R748 Abnormal levels of other serum enzymes: Secondary | ICD-10-CM

## 2023-12-25 ENCOUNTER — Encounter: Payer: Self-pay | Admitting: Internal Medicine

## 2023-12-25 MED ORDER — HYDROCOD POLI-CHLORPHE POLI ER 10-8 MG/5ML PO SUER: ORAL | 0 refills | Status: AC

## 2023-12-25 NOTE — Telephone Encounter (Signed)
 Please advise on controlled refill.

## 2023-12-25 NOTE — Telephone Encounter (Signed)
 Cough syrup refilled

## 2024-01-01 ENCOUNTER — Other Ambulatory Visit: Payer: Self-pay | Admitting: Family Medicine

## 2024-01-01 ENCOUNTER — Ambulatory Visit: Payer: Self-pay | Admitting: Family Medicine

## 2024-01-01 ENCOUNTER — Ambulatory Visit
Admission: RE | Admit: 2024-01-01 | Discharge: 2024-01-01 | Disposition: A | Discharge status: Home / Self Care | Source: Ambulatory Visit | Attending: Family Medicine | Admitting: Family Medicine

## 2024-01-01 DIAGNOSIS — K7400 Hepatic fibrosis, unspecified: Secondary | ICD-10-CM

## 2024-01-01 DIAGNOSIS — K760 Fatty (change of) liver, not elsewhere classified: Secondary | ICD-10-CM | POA: Diagnosis not present

## 2024-01-01 DIAGNOSIS — R748 Abnormal levels of other serum enzymes: Secondary | ICD-10-CM

## 2024-01-02 ENCOUNTER — Other Ambulatory Visit

## 2024-01-02 DIAGNOSIS — K7400 Hepatic fibrosis, unspecified: Secondary | ICD-10-CM

## 2024-01-03 ENCOUNTER — Ambulatory Visit: Payer: Self-pay | Admitting: Family Medicine

## 2024-01-04 ENCOUNTER — Telehealth: Payer: Self-pay

## 2024-01-04 NOTE — Telephone Encounter (Signed)
 Copied from CRM #[id number]. Topic: Clinical - Lab/Test Results >> Jan 04, 2024 10:15 AM Dedra B wrote: Reason for CRM: Pt would like a call regarding US  liver results and next steps.

## 2024-01-05 ENCOUNTER — Other Ambulatory Visit: Payer: Self-pay | Admitting: Family Medicine

## 2024-01-05 DIAGNOSIS — K7402 Hepatic fibrosis, advanced fibrosis: Secondary | ICD-10-CM

## 2024-01-05 LAB — CBC WITH DIFFERENTIAL/PLATELET
Basophils Absolute: 0.1 x10E3/uL (ref 0.0–0.2)
Basos: 1 %
EOS (ABSOLUTE): 0.2 x10E3/uL (ref 0.0–0.4)
Eos: 3 %
Hematocrit: 42.6 % (ref 34.0–46.6)
Hemoglobin: 14.4 g/dL (ref 11.1–15.9)
Immature Grans (Abs): 0 x10E3/uL (ref 0.0–0.1)
Immature Granulocytes: 0 %
Lymphocytes Absolute: 2.2 x10E3/uL (ref 0.7–3.1)
Lymphs: 33 %
MCH: 30.2 pg (ref 26.6–33.0)
MCHC: 33.8 g/dL (ref 31.5–35.7)
MCV: 89 fL (ref 79–97)
Monocytes Absolute: 0.5 x10E3/uL (ref 0.1–0.9)
Monocytes: 7 %
Neutrophils Absolute: 3.7 x10E3/uL (ref 1.4–7.0)
Neutrophils: 56 %
Platelets: 307 x10E3/uL (ref 150–450)
RBC: 4.77 x10E6/uL (ref 3.77–5.28)
RDW: 12.2 % (ref 11.7–15.4)
WBC: 6.6 x10E3/uL (ref 3.4–10.8)

## 2024-01-05 LAB — COMPREHENSIVE METABOLIC PANEL WITH GFR
ALT: 13 IU/L (ref 0–32)
AST: 26 IU/L (ref 0–40)
Albumin: 4.4 g/dL (ref 3.9–4.9)
Alkaline Phosphatase: 91 IU/L (ref 49–135)
BUN/Creatinine Ratio: 13 (ref 12–28)
BUN: 11 mg/dL (ref 8–27)
Bilirubin Total: 0.3 mg/dL (ref 0.0–1.2)
CO2: 23 mmol/L (ref 20–29)
Calcium: 9.4 mg/dL (ref 8.7–10.3)
Chloride: 101 mmol/L (ref 96–106)
Creatinine, Ser: 0.86 mg/dL (ref 0.57–1.00)
Globulin, Total: 2.2 g/dL (ref 1.5–4.5)
Glucose: 71 mg/dL (ref 70–99)
Potassium: 4.5 mmol/L (ref 3.5–5.2)
Sodium: 140 mmol/L (ref 134–144)
Total Protein: 6.6 g/dL (ref 6.0–8.5)
eGFR: 74 mL/min/1.73 (ref >59)

## 2024-01-05 LAB — PROTIME-INR
INR: 1 (ref 0.9–1.2)
Prothrombin Time: 10.7 s (ref 9.1–12.0)

## 2024-01-05 LAB — IRON,TIBC AND FERRITIN PANEL
Ferritin: 57 ng/mL (ref 15–150)
Iron Saturation: 27 % (ref 15–55)
Iron: 89 ug/dL (ref 27–139)
Total Iron Binding Capacity: 330 ug/dL (ref 250–450)
UIBC: 241 ug/dL (ref 118–369)

## 2024-01-05 LAB — HEMOCHROMATOSIS DNA-PCR(C282Y,H63D)

## 2024-01-05 LAB — ANTI-SMOOTH MUSCLE ANTIBODY, IGG: Smooth Muscle Ab: 4 U (ref 0–19)

## 2024-01-05 LAB — ALPHA-1-ANTITRYPSIN: A-1 Antitrypsin: 164 mg/dL (ref 101–187)

## 2024-01-05 LAB — ANA: Anti Nuclear Antibody (ANA): NEGATIVE

## 2024-01-05 LAB — MITOCHONDRIAL ANTIBODIES: Mitochondrial Ab: <20 U (ref 0.0–20.0)

## 2024-01-05 LAB — CERULOPLASMIN: Ceruloplasmin: 28.1 mg/dL (ref 19.0–39.0)

## 2024-01-05 NOTE — Telephone Encounter (Signed)
 I called the pt back and she is agreeable to hepatology referral

## 2024-01-08 ENCOUNTER — Other Ambulatory Visit

## 2024-01-09 ENCOUNTER — Telehealth: Payer: Self-pay

## 2024-01-09 NOTE — Telephone Encounter (Signed)
 Copied from CRM [id number]. Topic: Referral - Question >> Jan 09, 2024 11:07 AM Joesph NOVAK wrote: Reason for CRM: Meg From [facility] received a referral for this patient, but did not receive any clinical information attached. Can someone fax this information again?   FAX: [phone].  PH: [phone].   Progress notes has been faxed 01/09/2024

## 2024-01-10 ENCOUNTER — Telehealth: Payer: Self-pay

## 2024-01-10 NOTE — Telephone Encounter (Signed)
 Copied from CRM #[id number]. Topic: Medical Record Request - Records Request >> Jan 10, 2024  9:09 AM Viola F wrote: Reason for CRM: Duwaine from [facility] needs patients impressions/images if possible of the liver ultrasound from 01/01/24 faxed to [phone]. Her call back number if needed is [phone]

## 2024-01-10 NOTE — Telephone Encounter (Signed)
 Copied from CRM [id number]. Topic: Referral - Question >> Jan 10, 2024  3:45 PM Amy Hodge wrote: Reason for CRM: pt called in and referral that was requested can't see pt until March. Pt would like to know if there is one that can see her sooner.   Pt seen one at [facility] before. Pls follow up pt

## 2024-01-10 NOTE — Telephone Encounter (Signed)
 Returned a call to Duwaine to ask that she request those through Medical Records

## 2024-01-11 DIAGNOSIS — D225 Melanocytic nevi of trunk: Secondary | ICD-10-CM | POA: Diagnosis not present

## 2024-01-11 DIAGNOSIS — L814 Other melanin hyperpigmentation: Secondary | ICD-10-CM | POA: Diagnosis not present

## 2024-01-11 DIAGNOSIS — L821 Other seborrheic keratosis: Secondary | ICD-10-CM | POA: Diagnosis not present

## 2024-01-11 DIAGNOSIS — Z08 Encounter for follow-up examination after completed treatment for malignant neoplasm: Secondary | ICD-10-CM | POA: Diagnosis not present

## 2024-01-11 DIAGNOSIS — Z86007 Personal history of in-situ neoplasm of skin: Secondary | ICD-10-CM | POA: Diagnosis not present

## 2024-01-15 ENCOUNTER — Other Ambulatory Visit: Payer: Self-pay | Admitting: Family Medicine

## 2024-01-15 ENCOUNTER — Other Ambulatory Visit (HOSPITAL_COMMUNITY): Payer: Self-pay

## 2024-01-15 MED ORDER — SEMAGLUTIDE-WEIGHT MANAGEMENT 2.4 MG/0.75ML ~~LOC~~ SOAJ
2.4000 mg | SUBCUTANEOUS | 3 refills | Status: DC
  Filled 2024-01-15: qty 3, 28d supply, fill #0
  Filled 2024-02-09: qty 3, 28d supply, fill #1

## 2024-01-24 ENCOUNTER — Telehealth: Payer: Self-pay

## 2024-01-24 NOTE — Telephone Encounter (Signed)
 Copied from CRM #[id number]. Topic: General - Billing Inquiry >> Jan 22, 2024  3:37 PM Montie POUR wrote: Reason for CRM:  Mylene is telling Valentine that next year Dr. Chandra will not be in [employer]'s network. Please call Karin at [phone] to discuss. She did call [employer] and they did not look up anything and  told her to look for another doctor.

## 2024-02-13 ENCOUNTER — Ambulatory Visit: Payer: Self-pay

## 2024-02-13 NOTE — Telephone Encounter (Signed)
 FYI Only or Action Required?: FYI only for provider: appointment scheduled on 02/14/24.  Patient was last seen in primary care on 12/06/2023 by Chandra Toribio POUR, MD.  Called Nurse Triage reporting Muscle Pain.  Symptoms began several weeks ago.  Interventions attempted: Nothing.  Symptoms are: unchanged.  Triage Disposition: See PCP When Office is Open (Within 3 Days)  Patient/caregiver understands and will follow disposition?: Yes Reason for Disposition  [1] MODERATE pain (e.g., interferes with normal activities) AND [2] present > 3 days  Answer Assessment - Initial Assessment Questions Increased dose of Wegovy  last 4 doses. Patient is unsure if its liver, cannot see hepatologist until March  1. ONSET: When did the muscle aches or body pains start?      Noticed after 1st increased dose of Wegovy , 2 later.   2. LOCATION: What part of your body is hurting? (e.g., entire body, arms, legs)      Both arms, shoulders, legs   3. SEVERITY: How bad is the pain? (Scale 1-10; or mild, moderate, severe)     10/10 because it keeps patient up at night  4. CAUSE: What do you think is causing the pains?     Wegovy , but unsure  5. FEVER: Do you have a fever? If Yes, ask: What is your temperature, how was it measured, and  when did it start?      Denies  6. OTHER SYMPTOMS: Do you have any other symptoms? (e.g., chest pain, cold or flu symptoms, rash, weakness, weight loss)     Denies  Protocols used: Muscle Aches and Body Pain-A-AH  Copied from CRM #[id number]. Topic: Clinical - Red Word Triage >> Feb 13, 2024  9:11 AM Edsel HERO wrote: Severe muscle pain and weakness in shoulders and neck since starting the 2.4 Wegovy  4 weeks ago.

## 2024-02-14 ENCOUNTER — Encounter: Payer: Self-pay | Admitting: Family Medicine

## 2024-02-14 ENCOUNTER — Ambulatory Visit: Admitting: Family Medicine

## 2024-02-14 DIAGNOSIS — G729 Myopathy, unspecified: Secondary | ICD-10-CM

## 2024-02-14 DIAGNOSIS — M7918 Myalgia, other site: Secondary | ICD-10-CM

## 2024-02-14 MED ORDER — OXYCODONE HCL 5 MG PO TABS: 5.0000 mg | ORAL_TABLET | ORAL | 0 refills | Status: AC | PRN

## 2024-02-14 NOTE — Progress Notes (Unsigned)
° °  Acute Office Visit  Subjective:     Patient ID: Amy Hodge, female    DOB: 04-Sep-1955, 68 y.o.   MRN: [id number]  Chief Complaint  Patient presents with   Muscle Pain    HPI Patient is in today for   Subjective - Worsening diffuse myalgia and skin pain over the last two weeks. Reports intermittent symptoms for approximately one month prior, which were relieved with Tylenol . Sensation is described as a burning feeling, similar to a sunburn. Pain is constant, severe, and disrupts sleep. Affects shoulders, hips, and thighs.  Medications Current medications include amitriptyline , aspirin , B vitamin, bupropion , vitamin D , duloxetine , levothyroxine , losartan , omeprazole , Wegovy , and spironolactone , no recent changes. Reports intention to start Omega-3. Wegovy  dose was increased to current dose one month ago, but symptoms were present prior to the increase and did not worsen immediately after. Takes hydrocodone  cough syrup as needed.  PMH, PSH, FH, Social Hx PMHx: Shingles (hx of, with rash), COPD, osteoarthritis of the knee. FHx: Brother-in-law had polymyositis.  ROS - Pertinent positives: Reports diffuse myalgias, skin pain/tenderness described as burning, and fatigue secondary to poor sleep from pain. Has a chronic cough secondary to COPD. - Pertinent negatives: Denies rash, pruritus, joint swelling, headache, nausea, vomiting, constipation, diarrhea, and chest pain.   ROS      Objective:    BP 121/87   Pulse 88   Ht 5' 8 (1.727 m)   Wt 189 lb (85.7 kg)   SpO2 93%   BMI 28.74 kg/m  {Vitals History (Optional):23777}  Physical Exam General: alert, oriented Skin: No rash or swelling noted on exam. Tender to light palpation diffusely, including between the shoulder blades. MSK: Reports tenderness to palpation of muscles in shoulders, hips, and thighs. No joint swelling noted.  No results found for any visits on 02/14/24.      Assessment & Plan:   Myalgia,  multiple sites Assessment & Plan: - Presents with a one-month history of intermittent, and two-week history of constant, diffuse myalgia and burning skin pain without a rash. Pain is severe and disrupts sleep. Examination is notable for tenderness to palpation without rash or joint swelling. Differential diagnosis includes inflammatory myopathies such as polymyositis or dermatomyositis. - Plan to obtain blood tests, including autoimmune markers, to investigate for inflammatory myopathies. - Prescribed oxycodone  for short-term pain relief pending lab results. - Discussed potential for oral steroids depending on lab results. - Recommended trying over-the-counter topical analgesics containing lidocaine  or menthol for symptomatic relief. - Schedule follow-up appointment in 4-5 weeks to review results and progress.   Myopathy -     Comprehensive metabolic panel with GFR -     CBC with Differential/Platelet -     CK -     ANA w/Reflex if Positive -     Sedimentation rate -     C-reactive protein -     Anti-Jo-1 Ab (RDL) -     Lactate dehydrogenase -     TSH -     Magnesium  Other orders -     oxyCODONE  HCl; Take 1 tablet (5 mg total) by mouth every 4 (four) hours as needed for severe pain (pain score 7-10) or breakthrough pain.  Dispense: 12 tablet; Refill: 0     Return in about 4 weeks (around 03/13/2024).  Toribio MARLA Slain, MD

## 2024-02-14 NOTE — Assessment & Plan Note (Signed)
-   Presents with a one-month history of intermittent, and two-week history of constant, diffuse myalgia and burning skin pain without a rash. Pain is severe and disrupts sleep. Examination is notable for tenderness to palpation without rash or joint swelling. Differential diagnosis includes inflammatory myopathies such as polymyositis or dermatomyositis. - Plan to obtain blood tests, including autoimmune markers, to investigate for inflammatory myopathies. - Prescribed oxycodone  for short-term pain relief pending lab results. - Discussed potential for oral steroids depending on lab results. - Recommended trying over-the-counter topical analgesics containing lidocaine  or menthol for symptomatic relief. - Schedule follow-up appointment in 4-5 weeks to review results and progress.

## 2024-02-14 NOTE — Patient Instructions (Signed)
 It was nice to see you today,  We addressed the following topics today: - I have ordered some blood tests for you. Please proceed to the lab. - I am prescribing a short course of oxycodone  for pain control until we have the lab results, which should be available tomorrow. - You can try over-the-counter topical creams with lidocaine  or menthol, like Icy Hot, for some relief. - Please schedule a follow-up appointment at the front desk for about a month from now. We can cancel it if it's not needed. - I will contact you tomorrow with the lab results and to discuss the next steps, which may include starting steroids.  Have a great day,  Rolan Slain, MD

## 2024-02-15 LAB — COMPREHENSIVE METABOLIC PANEL WITH GFR
ALT: 20 IU/L (ref 0–32)
AST: 32 IU/L (ref 0–40)
Albumin: 4.3 g/dL (ref 3.9–4.9)
Alkaline Phosphatase: 89 IU/L (ref 49–135)
BUN/Creatinine Ratio: 17 (ref 12–28)
BUN: 14 mg/dL (ref 8–27)
Bilirubin Total: 0.2 mg/dL (ref 0.0–1.2)
CO2: 21 mmol/L (ref 20–29)
Calcium: 9.1 mg/dL (ref 8.7–10.3)
Chloride: 101 mmol/L (ref 96–106)
Creatinine, Ser: 0.82 mg/dL (ref 0.57–1.00)
Globulin, Total: 2.3 g/dL (ref 1.5–4.5)
Glucose: 89 mg/dL (ref 70–99)
Potassium: 4.8 mmol/L (ref 3.5–5.2)
Sodium: 138 mmol/L (ref 134–144)
Total Protein: 6.6 g/dL (ref 6.0–8.5)
eGFR: 79 mL/min/1.73 (ref >59)

## 2024-02-15 LAB — ANA W/REFLEX IF POSITIVE: Anti Nuclear Antibody (ANA): NEGATIVE

## 2024-02-15 LAB — CK: Total CK: 65 U/L (ref 32–182)

## 2024-02-15 LAB — TSH: TSH: 2.77 u[IU]/mL (ref 0.450–4.500)

## 2024-02-15 LAB — CBC WITH DIFFERENTIAL/PLATELET
Basophils Absolute: 0.1 x10E3/uL (ref 0.0–0.2)
Basos: 1 %
EOS (ABSOLUTE): 0.2 x10E3/uL (ref 0.0–0.4)
Eos: 2 %
Hematocrit: 43.4 % (ref 34.0–46.6)
Hemoglobin: 14.4 g/dL (ref 11.1–15.9)
Immature Grans (Abs): 0 x10E3/uL (ref 0.0–0.1)
Immature Granulocytes: 0 %
Lymphocytes Absolute: 2.2 x10E3/uL (ref 0.7–3.1)
Lymphs: 32 %
MCH: 29.8 pg (ref 26.6–33.0)
MCHC: 33.2 g/dL (ref 31.5–35.7)
MCV: 90 fL (ref 79–97)
Monocytes Absolute: 0.5 x10E3/uL (ref 0.1–0.9)
Monocytes: 7 %
Neutrophils Absolute: 3.9 x10E3/uL (ref 1.4–7.0)
Neutrophils: 58 %
Platelets: 309 x10E3/uL (ref 150–450)
RBC: 4.83 x10E6/uL (ref 3.77–5.28)
RDW: 12.6 % (ref 11.7–15.4)
WBC: 6.8 x10E3/uL (ref 3.4–10.8)

## 2024-02-15 LAB — SEDIMENTATION RATE: Sed Rate: 11 mm/h (ref 0–40)

## 2024-02-15 LAB — C-REACTIVE PROTEIN: CRP: 1 mg/L (ref 0–10)

## 2024-02-15 LAB — LACTATE DEHYDROGENASE: LDH: 196 IU/L (ref 119–226)

## 2024-02-15 LAB — MAGNESIUM: Magnesium: 1.9 mg/dL (ref 1.6–2.3)

## 2024-02-16 ENCOUNTER — Ambulatory Visit: Payer: Self-pay | Admitting: Family Medicine

## 2024-02-16 DIAGNOSIS — M7918 Myalgia, other site: Secondary | ICD-10-CM

## 2024-02-16 NOTE — Progress Notes (Signed)
 Called [facility] spoke to tech labs will be added

## 2024-02-21 LAB — ANTI-JO-1 AB (RDL): Anti-Jo-1 Ab (RDL): <20 U (ref <20)

## 2024-02-26 ENCOUNTER — Other Ambulatory Visit (HOSPITAL_COMMUNITY): Payer: Self-pay

## 2024-02-26 ENCOUNTER — Telehealth: Payer: Self-pay | Admitting: Pharmacy Technician

## 2024-02-26 NOTE — Telephone Encounter (Signed)
 Pharmacy Patient Advocate Encounter  Received notification from [employer] that Prior Authorization for Wegovy  2.4MG /0.75ML auto-injectors has been APPROVED from 12/06/2023 to 03/06/2025.   PA #/Case ID/Reference #: [id number]

## 2024-03-01 ENCOUNTER — Other Ambulatory Visit: Payer: Self-pay | Admitting: Family Medicine

## 2024-03-01 DIAGNOSIS — M791 Myalgia, unspecified site: Secondary | ICD-10-CM

## 2024-03-04 NOTE — Progress Notes (Signed)
 Called [facility] they stated that they have no labs on this patient the last time labs were drawn was 6 months ago

## 2024-03-04 NOTE — Telephone Encounter (Signed)
 I'm not sure how that's possible. We ordered labs on 12/10 and called them back to add RF, CCP, B12, and vit D. They've all resulted except for the ccp.    It can wait until I see her next week and if I need to I'll recollect it.

## 2024-03-06 ENCOUNTER — Other Ambulatory Visit (HOSPITAL_COMMUNITY): Payer: Self-pay

## 2024-03-13 ENCOUNTER — Ambulatory Visit: Admitting: Family Medicine

## 2024-03-13 ENCOUNTER — Encounter: Payer: Self-pay | Admitting: Family Medicine

## 2024-03-13 VITALS — BP 115/73 | HR 82 | Temp 98.3°F | Ht 68.0 in | Wt 190.4 lb

## 2024-03-13 DIAGNOSIS — J441 Chronic obstructive pulmonary disease with (acute) exacerbation: Secondary | ICD-10-CM | POA: Diagnosis not present

## 2024-03-13 DIAGNOSIS — R059 Cough, unspecified: Secondary | ICD-10-CM | POA: Diagnosis not present

## 2024-03-13 DIAGNOSIS — M7918 Myalgia, other site: Secondary | ICD-10-CM | POA: Diagnosis not present

## 2024-03-13 LAB — POCT INFLUENZA A/B
Influenza A, POC: NEGATIVE
Influenza B, POC: NEGATIVE

## 2024-03-13 MED ORDER — CEFDINIR 300 MG PO CAPS: 300.0000 mg | ORAL_CAPSULE | Freq: Two times a day (BID) | ORAL | 0 refills | Status: AC

## 2024-03-13 MED ORDER — PREDNISONE 20 MG PO TABS: 40.0000 mg | ORAL_TABLET | Freq: Every day | ORAL | 0 refills | Status: AC

## 2024-03-13 NOTE — Assessment & Plan Note (Signed)
 Muscle pain resolved after discontinuation of wegovy  2.4mg .. Symptoms were dose-dependent.consider restarting at a lower dose in the future.  Only had issue w/ 2.4mg  dosing - Monitor liver enzymes. - Consider restarting Wegovy  at lower dose if liver enzymes stable and symptoms manageable.

## 2024-03-13 NOTE — Progress Notes (Signed)
" ° °  Established Patient Office Visit  Subjective   Patient ID: KAMALA KOLTON, female    DOB: May 25, 1955  Age: 69 y.o. MRN: [id number]  Chief Complaint  Patient presents with   Medical Management of Chronic Issues     History of Present Illness   LAYAL JAVID is a 69 year old female who presents with a cough and shortness of breath.  She has experienced a cough for the past five days, which is sometimes productive, without associated fever. No recent exposure to anyone who is sick. Along with the cough, she has headaches and sinus drainage that alternates between congestion and drainage.  She reports increased shortness of breath and has been using her albuterol  inhaler more frequently. She has also started taking breathing treatments with her nebulizer. No nausea, vomiting, or diarrhea.  She previously experienced muscle pain while taking Wegovy , which resolved after discontinuing the medication. She tolerated lower doses of Wegovy  without issues, but experienced significant pain at the highest dose of 2.4 mg, which she started in November.          The ASCVD Risk score (Arnett DK, et al., 2019) failed to calculate for the following reasons:   Risk score cannot be calculated because patient has a medical history suggesting prior/existing ASCVD   * - Cholesterol units were assumed  Health Maintenance Due  Topic Date Due   Zoster Vaccines- Shingrix  (1 of 2) Never done   COVID-19 Vaccine (3 - Pfizer risk series) 05/04/2020      Objective:     BP 115/73   Pulse 82   Temp 98.3 F (36.8 C)   Ht 5' 8 (1.727 m)   Wt 190 lb 6.4 oz (86.4 kg)   SpO2 93%   BMI 28.95 kg/m    Physical Exam     Gen: alert, oriented Cv: rrr Pulm: mild wheezing.  No rhonchi.  No resp distress Psych: pleasant affect       Results for orders placed or performed in visit on 03/13/24  POCT Influenza A/B  Result Value Ref Range   Influenza A, POC Negative Negative   Influenza B, POC  Negative Negative        Assessment & Plan:   COPD exacerbation (HCC) Assessment & Plan: Acute exacerbation with increased coughing, fatigue, and albuterol  use. Differential includes viral infection. - Ordered flu and COVID-19 test. - Prescribed antibiotics. - Prescribed prednisone . - Will prescribe Tamiflu if flu test is positive.   Cough, unspecified type -     POCT Influenza A/B  Myalgia, multiple sites Assessment & Plan: Muscle pain resolved after discontinuation of wegovy  2.4mg .. Symptoms were dose-dependent.consider restarting at a lower dose in the future.  Only had issue w/ 2.4mg  dosing - Monitor liver enzymes. - Consider restarting Wegovy  at lower dose if liver enzymes stable and symptoms manageable.    Other orders -     predniSONE ; Take 2 tablets (40 mg total) by mouth daily with breakfast for 5 days.  Dispense: 10 tablet; Refill: 0 -     Cefdinir ; Take 1 capsule (300 mg total) by mouth 2 (two) times daily for 5 days.  Dispense: 10 capsule; Refill: 0       No follow-ups on file.    Toribio MARLA Slain, MD  "

## 2024-03-13 NOTE — Patient Instructions (Signed)
 It was nice to see you today,  We addressed the following topics today: - I am sending in an antibiotic and prednisone  for you to take - if your flu test is positive we will send in tamiflu and call you.   Have a great day,  Rolan Slain, MD

## 2024-03-13 NOTE — Assessment & Plan Note (Signed)
 Acute exacerbation with increased coughing, fatigue, and albuterol  use. Differential includes viral infection. - Ordered flu and COVID-19 test. - Prescribed antibiotics. - Prescribed prednisone . - Will prescribe Tamiflu if flu test is positive.

## 2024-03-14 ENCOUNTER — Telehealth: Payer: Self-pay

## 2024-03-14 ENCOUNTER — Other Ambulatory Visit (HOSPITAL_COMMUNITY): Payer: Self-pay

## 2024-03-14 NOTE — Telephone Encounter (Signed)
 Pharmacy Patient Advocate Encounter   Received notification from [employer] that prior authorization for hydrOXYzine  (ATARAX ) 25 MG tablet  is required/requested.   Insurance verification completed.   The patient is insured through [employer].   Per test claim: PA required; PA submitted to above mentioned insurance via Latent Key/confirmation #/EOC [id number] Status is pending

## 2024-03-18 ENCOUNTER — Encounter: Payer: Self-pay | Admitting: *Deleted

## 2024-03-18 NOTE — Telephone Encounter (Signed)
 Pharmacy Patient Advocate Encounter  Received notification from [employer] that Prior Authorization for hydrOXYzine  HCl 25MG  tablets  has been APPROVED from 03/07/2024 to 03/06/2025   PA #/Case ID/Reference #: [id number]

## 2024-03-26 LAB — STATUS REPORT

## 2024-03-27 LAB — RHEUMATOID FACTOR: Rheumatoid fact SerPl-aCnc: 10.8 [IU]/mL

## 2024-03-27 LAB — B12 AND FOLATE PANEL
Folate: >20 ng/mL
Vitamin B-12: 1084 pg/mL (ref 232–1245)

## 2024-03-27 LAB — ANTI-CCP AB, IGG + IGA (RDL)

## 2024-03-27 LAB — VITAMIN D 25 HYDROXY (VIT D DEFICIENCY, FRACTURES): Vit D, 25-Hydroxy: 68.4 ng/mL (ref 30.0–100.0)

## 2024-03-27 LAB — SPECIMEN STATUS REPORT

## 2024-04-19 ENCOUNTER — Ambulatory Visit: Payer: Medicare HMO | Admitting: Internal Medicine

## 2024-04-23 ENCOUNTER — Encounter

## 2024-06-05 ENCOUNTER — Ambulatory Visit: Admitting: Family Medicine

## 2024-07-26 ENCOUNTER — Ambulatory Visit: Admitting: Cardiology

## 2024-08-29 ENCOUNTER — Ambulatory Visit
